# Patient Record
Sex: Male | Born: 1960 | Race: White | Hispanic: No | Marital: Married | Smoking: Former smoker
Health system: Southern US, Community
[De-identification: ages and names within clinical notes are randomized; demographics above are authoritative.]

## PROBLEM LIST (undated history)

## (undated) DIAGNOSIS — I10 Essential (primary) hypertension: Secondary | ICD-10-CM

## (undated) DIAGNOSIS — E781 Pure hyperglyceridemia: Secondary | ICD-10-CM

## (undated) DIAGNOSIS — I2510 Atherosclerotic heart disease of native coronary artery without angina pectoris: Secondary | ICD-10-CM

## (undated) DIAGNOSIS — I341 Nonrheumatic mitral (valve) prolapse: Secondary | ICD-10-CM

## (undated) DIAGNOSIS — I519 Heart disease, unspecified: Secondary | ICD-10-CM

## (undated) DIAGNOSIS — K219 Gastro-esophageal reflux disease without esophagitis: Secondary | ICD-10-CM

## (undated) DIAGNOSIS — K760 Fatty (change of) liver, not elsewhere classified: Secondary | ICD-10-CM

## (undated) DIAGNOSIS — F329 Major depressive disorder, single episode, unspecified: Secondary | ICD-10-CM

## (undated) DIAGNOSIS — F909 Attention-deficit hyperactivity disorder, unspecified type: Secondary | ICD-10-CM

## (undated) DIAGNOSIS — H409 Unspecified glaucoma: Secondary | ICD-10-CM

## (undated) DIAGNOSIS — F32A Depression, unspecified: Secondary | ICD-10-CM

## (undated) DIAGNOSIS — E785 Hyperlipidemia, unspecified: Secondary | ICD-10-CM

## (undated) DIAGNOSIS — E119 Type 2 diabetes mellitus without complications: Secondary | ICD-10-CM

## (undated) DIAGNOSIS — R51 Headache: Secondary | ICD-10-CM

## (undated) DIAGNOSIS — G47411 Narcolepsy with cataplexy: Secondary | ICD-10-CM

## (undated) DIAGNOSIS — E291 Testicular hypofunction: Secondary | ICD-10-CM

## (undated) DIAGNOSIS — F419 Anxiety disorder, unspecified: Secondary | ICD-10-CM

## (undated) DIAGNOSIS — R519 Headache, unspecified: Secondary | ICD-10-CM

## (undated) HISTORY — DX: Anxiety disorder, unspecified: F41.9

## (undated) HISTORY — PX: OTHER SURGICAL HISTORY: SHX169

## (undated) HISTORY — DX: Heart disease, unspecified: I51.9

## (undated) HISTORY — PX: SPINE SURGERY: SHX786

## (undated) HISTORY — DX: Attention-deficit hyperactivity disorder, unspecified type: F90.9

## (undated) HISTORY — PX: THROAT SURGERY: SHX803

---

## 1998-11-20 DIAGNOSIS — Z79899 Other long term (current) drug therapy: Secondary | ICD-10-CM | POA: Insufficient documentation

## 1998-11-20 DIAGNOSIS — M961 Postlaminectomy syndrome, not elsewhere classified: Secondary | ICD-10-CM | POA: Insufficient documentation

## 2002-11-25 ENCOUNTER — Encounter: Admission: RE | Admit: 2002-11-25 | Discharge: 2002-11-25 | Payer: Self-pay | Admitting: Pain Medicine

## 2002-11-25 ENCOUNTER — Encounter: Payer: Self-pay | Admitting: Pain Medicine

## 2004-08-15 ENCOUNTER — Ambulatory Visit: Payer: Self-pay | Admitting: Pain Medicine

## 2004-08-29 ENCOUNTER — Ambulatory Visit: Payer: Self-pay | Admitting: Physician Assistant

## 2004-09-25 ENCOUNTER — Ambulatory Visit: Payer: Self-pay | Admitting: Physician Assistant

## 2004-12-03 ENCOUNTER — Ambulatory Visit: Payer: Self-pay | Admitting: Pain Medicine

## 2004-12-04 ENCOUNTER — Ambulatory Visit: Payer: Self-pay | Admitting: Pain Medicine

## 2004-12-26 ENCOUNTER — Ambulatory Visit: Payer: Self-pay | Admitting: Physician Assistant

## 2005-01-10 ENCOUNTER — Ambulatory Visit: Payer: Self-pay | Admitting: Pain Medicine

## 2005-02-21 ENCOUNTER — Ambulatory Visit: Payer: Self-pay | Admitting: Physician Assistant

## 2005-03-28 ENCOUNTER — Ambulatory Visit: Payer: Self-pay | Admitting: Physician Assistant

## 2005-05-02 ENCOUNTER — Ambulatory Visit: Payer: Self-pay | Admitting: Physician Assistant

## 2005-05-14 ENCOUNTER — Ambulatory Visit: Payer: Self-pay | Admitting: Pain Medicine

## 2005-06-03 ENCOUNTER — Ambulatory Visit: Payer: Self-pay | Admitting: Physician Assistant

## 2005-07-02 ENCOUNTER — Ambulatory Visit: Payer: Self-pay | Admitting: Physician Assistant

## 2005-07-18 ENCOUNTER — Ambulatory Visit: Payer: Self-pay | Admitting: Pain Medicine

## 2005-08-01 ENCOUNTER — Ambulatory Visit: Payer: Self-pay | Admitting: Physician Assistant

## 2005-08-29 ENCOUNTER — Ambulatory Visit: Payer: Self-pay | Admitting: Physician Assistant

## 2005-10-01 ENCOUNTER — Ambulatory Visit: Payer: Self-pay | Admitting: Pain Medicine

## 2005-10-10 ENCOUNTER — Ambulatory Visit: Payer: Self-pay | Admitting: Pain Medicine

## 2005-11-05 ENCOUNTER — Ambulatory Visit: Payer: Self-pay | Admitting: Physician Assistant

## 2005-12-31 ENCOUNTER — Ambulatory Visit: Payer: Self-pay | Admitting: Physician Assistant

## 2006-01-28 ENCOUNTER — Ambulatory Visit: Payer: Self-pay | Admitting: Physician Assistant

## 2006-02-26 ENCOUNTER — Ambulatory Visit: Payer: Self-pay | Admitting: Physician Assistant

## 2006-03-27 ENCOUNTER — Ambulatory Visit: Payer: Self-pay | Admitting: Physician Assistant

## 2006-04-22 ENCOUNTER — Ambulatory Visit: Payer: Self-pay | Admitting: Pain Medicine

## 2006-05-12 ENCOUNTER — Ambulatory Visit: Payer: Self-pay | Admitting: Physician Assistant

## 2006-06-26 ENCOUNTER — Ambulatory Visit: Payer: Self-pay | Admitting: Physician Assistant

## 2006-07-08 ENCOUNTER — Ambulatory Visit: Payer: Self-pay | Admitting: Pain Medicine

## 2006-07-28 ENCOUNTER — Ambulatory Visit: Payer: Self-pay | Admitting: Physician Assistant

## 2006-08-12 ENCOUNTER — Ambulatory Visit: Payer: Self-pay | Admitting: Pain Medicine

## 2006-08-28 ENCOUNTER — Ambulatory Visit: Payer: Self-pay | Admitting: Physician Assistant

## 2006-09-11 ENCOUNTER — Ambulatory Visit: Payer: Self-pay | Admitting: Physician Assistant

## 2006-11-10 ENCOUNTER — Ambulatory Visit: Payer: Self-pay | Admitting: Pain Medicine

## 2006-11-19 ENCOUNTER — Ambulatory Visit: Payer: Self-pay | Admitting: Unknown Physician Specialty

## 2006-11-20 ENCOUNTER — Ambulatory Visit: Payer: Self-pay | Admitting: Pain Medicine

## 2006-11-26 ENCOUNTER — Ambulatory Visit: Payer: Self-pay | Admitting: Unknown Physician Specialty

## 2006-12-10 ENCOUNTER — Ambulatory Visit: Payer: Self-pay | Admitting: Physician Assistant

## 2006-12-25 ENCOUNTER — Ambulatory Visit: Payer: Self-pay | Admitting: Pain Medicine

## 2007-01-08 ENCOUNTER — Ambulatory Visit: Payer: Self-pay | Admitting: Physician Assistant

## 2007-02-06 ENCOUNTER — Ambulatory Visit: Payer: Self-pay | Admitting: Physician Assistant

## 2007-03-10 ENCOUNTER — Ambulatory Visit: Payer: Self-pay | Admitting: Physician Assistant

## 2007-04-17 ENCOUNTER — Ambulatory Visit: Payer: Self-pay | Admitting: Physician Assistant

## 2007-05-13 ENCOUNTER — Ambulatory Visit: Payer: Self-pay | Admitting: Physician Assistant

## 2007-06-09 ENCOUNTER — Ambulatory Visit: Payer: Self-pay | Admitting: Pain Medicine

## 2007-07-15 ENCOUNTER — Ambulatory Visit: Payer: Self-pay | Admitting: Physician Assistant

## 2007-08-10 ENCOUNTER — Ambulatory Visit: Payer: Self-pay | Admitting: Pain Medicine

## 2007-11-10 ENCOUNTER — Ambulatory Visit: Payer: Self-pay | Admitting: Physician Assistant

## 2008-02-10 ENCOUNTER — Ambulatory Visit: Payer: Self-pay | Admitting: Physician Assistant

## 2008-04-28 ENCOUNTER — Ambulatory Visit: Payer: Self-pay | Admitting: Pain Medicine

## 2008-06-03 ENCOUNTER — Ambulatory Visit (HOSPITAL_COMMUNITY): Admission: RE | Admit: 2008-06-03 | Discharge: 2008-06-03 | Payer: Self-pay | Admitting: Neurological Surgery

## 2008-08-16 ENCOUNTER — Ambulatory Visit: Payer: Self-pay | Admitting: Physician Assistant

## 2008-10-18 ENCOUNTER — Ambulatory Visit: Payer: Self-pay | Admitting: Family Medicine

## 2008-11-02 ENCOUNTER — Ambulatory Visit: Payer: Self-pay | Admitting: Family Medicine

## 2008-11-09 ENCOUNTER — Ambulatory Visit: Payer: Self-pay | Admitting: Pain Medicine

## 2008-11-09 ENCOUNTER — Ambulatory Visit: Payer: Self-pay | Admitting: Physician Assistant

## 2008-11-23 ENCOUNTER — Ambulatory Visit: Payer: Self-pay | Admitting: Pain Medicine

## 2008-12-07 ENCOUNTER — Ambulatory Visit: Payer: Self-pay | Admitting: Pain Medicine

## 2009-03-08 ENCOUNTER — Ambulatory Visit: Payer: Self-pay | Admitting: Pain Medicine

## 2009-04-17 ENCOUNTER — Ambulatory Visit: Payer: Self-pay | Admitting: Pain Medicine

## 2009-04-24 ENCOUNTER — Inpatient Hospital Stay: Payer: Self-pay | Admitting: Pain Medicine

## 2009-04-24 ENCOUNTER — Ambulatory Visit: Payer: Self-pay | Admitting: Pain Medicine

## 2009-05-01 ENCOUNTER — Ambulatory Visit: Payer: Self-pay | Admitting: Pain Medicine

## 2009-05-12 ENCOUNTER — Ambulatory Visit: Payer: Self-pay | Admitting: Pain Medicine

## 2009-05-16 ENCOUNTER — Ambulatory Visit: Payer: Self-pay | Admitting: Pain Medicine

## 2009-05-25 ENCOUNTER — Ambulatory Visit: Payer: Self-pay | Admitting: Pain Medicine

## 2009-05-25 ENCOUNTER — Ambulatory Visit: Payer: Self-pay | Admitting: Physician Assistant

## 2009-05-31 ENCOUNTER — Ambulatory Visit: Payer: Self-pay | Admitting: Physician Assistant

## 2009-06-08 ENCOUNTER — Ambulatory Visit: Payer: Self-pay | Admitting: Pain Medicine

## 2009-06-12 ENCOUNTER — Ambulatory Visit: Payer: Self-pay | Admitting: Pain Medicine

## 2009-06-15 ENCOUNTER — Ambulatory Visit: Payer: Self-pay | Admitting: Physician Assistant

## 2009-06-29 ENCOUNTER — Ambulatory Visit: Payer: Self-pay | Admitting: Physician Assistant

## 2009-07-05 ENCOUNTER — Ambulatory Visit: Payer: Self-pay | Admitting: Physician Assistant

## 2009-07-13 ENCOUNTER — Ambulatory Visit: Payer: Self-pay | Admitting: Physician Assistant

## 2009-07-24 ENCOUNTER — Ambulatory Visit: Payer: Self-pay | Admitting: Physician Assistant

## 2009-08-14 ENCOUNTER — Ambulatory Visit: Payer: Self-pay | Admitting: Pain Medicine

## 2009-08-14 ENCOUNTER — Other Ambulatory Visit: Payer: Self-pay | Admitting: Pain Medicine

## 2009-08-28 ENCOUNTER — Ambulatory Visit: Payer: Self-pay | Admitting: Pain Medicine

## 2009-09-18 ENCOUNTER — Ambulatory Visit: Payer: Self-pay | Admitting: Pain Medicine

## 2009-09-26 ENCOUNTER — Ambulatory Visit: Payer: Self-pay

## 2009-10-02 ENCOUNTER — Ambulatory Visit: Payer: Self-pay | Admitting: Pain Medicine

## 2009-10-12 ENCOUNTER — Ambulatory Visit: Payer: Self-pay | Admitting: Unknown Physician Specialty

## 2009-10-13 ENCOUNTER — Ambulatory Visit: Payer: Self-pay | Admitting: Unknown Physician Specialty

## 2009-10-25 ENCOUNTER — Ambulatory Visit: Payer: Self-pay

## 2009-10-31 ENCOUNTER — Ambulatory Visit: Payer: Self-pay | Admitting: Unknown Physician Specialty

## 2009-11-21 ENCOUNTER — Ambulatory Visit: Payer: Self-pay | Admitting: Family Medicine

## 2009-11-27 ENCOUNTER — Ambulatory Visit: Payer: Self-pay | Admitting: Pain Medicine

## 2009-12-21 ENCOUNTER — Ambulatory Visit: Payer: Self-pay | Admitting: Family Medicine

## 2009-12-22 ENCOUNTER — Ambulatory Visit: Payer: Self-pay | Admitting: Pain Medicine

## 2010-02-21 ENCOUNTER — Ambulatory Visit: Payer: Self-pay | Admitting: Neurological Surgery

## 2010-03-15 ENCOUNTER — Ambulatory Visit: Payer: Self-pay | Admitting: Pain Medicine

## 2010-03-16 ENCOUNTER — Ambulatory Visit: Payer: Self-pay | Admitting: Pain Medicine

## 2010-03-19 ENCOUNTER — Ambulatory Visit: Payer: Self-pay | Admitting: Pain Medicine

## 2010-03-28 ENCOUNTER — Ambulatory Visit: Payer: Self-pay | Admitting: Pain Medicine

## 2010-04-17 ENCOUNTER — Ambulatory Visit: Payer: Self-pay | Admitting: Pain Medicine

## 2010-04-30 ENCOUNTER — Ambulatory Visit: Payer: Self-pay | Admitting: Neurological Surgery

## 2010-05-07 ENCOUNTER — Emergency Department: Payer: Self-pay | Admitting: Emergency Medicine

## 2010-06-11 ENCOUNTER — Ambulatory Visit: Payer: Self-pay | Admitting: Pain Medicine

## 2010-06-14 ENCOUNTER — Ambulatory Visit: Payer: Self-pay | Admitting: Pain Medicine

## 2010-06-18 ENCOUNTER — Ambulatory Visit: Payer: Self-pay | Admitting: Pain Medicine

## 2010-06-22 ENCOUNTER — Ambulatory Visit: Payer: Self-pay | Admitting: Pain Medicine

## 2010-07-02 ENCOUNTER — Ambulatory Visit: Payer: Self-pay | Admitting: Pain Medicine

## 2010-08-30 ENCOUNTER — Ambulatory Visit: Payer: Self-pay | Admitting: Pain Medicine

## 2010-08-31 ENCOUNTER — Ambulatory Visit: Payer: Self-pay | Admitting: Pain Medicine

## 2010-09-05 ENCOUNTER — Ambulatory Visit: Payer: Self-pay | Admitting: Pain Medicine

## 2010-09-10 ENCOUNTER — Ambulatory Visit: Payer: Self-pay | Admitting: Pain Medicine

## 2010-09-12 ENCOUNTER — Ambulatory Visit: Payer: Self-pay | Admitting: Pain Medicine

## 2010-09-19 ENCOUNTER — Ambulatory Visit: Payer: Self-pay | Admitting: Pain Medicine

## 2011-03-19 NOTE — Op Note (Signed)
Dale [name], Dale [name] NO.:  [id number]   MEDICAL RECORD NO.:  [id number]          PATIENT TYPE:  AMB   LOCATION:  SDS                          FACILITY:  [facility]   PHYSICIAN:  Tia Alert, MD     DATE OF BIRTH:  1961/09/23   DATE OF PROCEDURE:  06/03/2008  DATE OF DISCHARGE:                               OPERATIVE REPORT   PREOPERATIVE DIAGNOSIS:  Right carpal tunnel syndrome.   POSTOPERATIVE DIAGNOSIS:  Right carpal tunnel syndrome.   PROCEDURE:  Right carpal tunnel release.   SURGEON:  Tia Alert, MD   ANESTHESIA:  Local with MAC.   COMPLICATIONS:  None apparent.   INDICATIONS FOR PROCEDURE:  [name] is a 50 year old gentleman who  presented with significant numbness and tingling in the right hand,  especially at night.  He also felt himself shaking his hands out.  He  had nerve conduction studies showing significant median neuropathy on  the right side.  I recommended a right carpal tunnel release.  He  understood the risks, benefits, and expected outcome and wished to  proceed.   DESCRIPTION OF PROCEDURE:  The patient was taken to the operating room  and placed in the supine position with his right arm extended on an arm  board.  His arm and hand were prepped circumferentially from the axilla  to the fingertips with DuraPrep and draped in the usual sterile fashion.  A 9 mL of local anesthesia was injected and a small palmar incision was  made from the distal wrist crease into the palm in line with the middle  finger.  I carried the incision down through the palmar fascia to  identify the palmar fat.  I coagulated this and then identified the  transverse carpal ligament.  The ligament was opened with a 15 blade  scalpel and then spread between the ligament and the median nerve with a  curved hemostat and completely transected the transverse carpal ligament  distally into the palm until it was completely free and I saw the palmar  fat.  Once it  was transected, I palpated with a curved hemostat and felt  no more compression of the median nerve distally into the palm.  I then  did the same thing proximally under the wrist crease spreading between  the nerve and the ligament with a curved hemostat and transecting the  ligament completely proximally into the wrist.  Once this was done, I  once again palpated with a curved hemostat to assure complete  transection of the transverse carpal ligament.  I then irrigated with  saline solution and inspected the nerve once again, it looked healthy  and free.  I palpated it once again and then dried all bleeding points  and then closed the palmar fascia with a single 3-0 Vicryl, closed the  subcuticular tissue with  interrupted 3-0 Vicryl, and closed the skin with interrupted 4-0 Ethilon  vertical mattress sutures.  The hand was then wrapped in a Kerlix and  Ace bandage.  The patient was taken to the recovery room in stable  condition.  At the end of the procedure, all sponge, needle, and  instrument counts were correct.      Tia Alert, MD  Electronically Signed     DSJ/MEDQ  D:  06/03/2008  T:  06/03/2008  Job:  [id number]

## 2011-08-02 LAB — DIFFERENTIAL
Basophils Absolute: 0
Basophils Relative: 0
Eosinophils Absolute: 0.1
Eosinophils Relative: 1
Lymphocytes Relative: 27
Lymphs Abs: 2.5
Monocytes Absolute: 0.9
Monocytes Relative: 10
Neutro Abs: 5.7
Neutrophils Relative %: 62

## 2011-08-02 LAB — BASIC METABOLIC PANEL
BUN: 16
CO2: 35 — ABNORMAL HIGH
Calcium: 9.7
Chloride: 98
Creatinine, Ser: 0.65
GFR calc Af Amer: >60
GFR calc non Af Amer: >60
Glucose, Bld: 95
Potassium: 3.7
Sodium: 140

## 2011-08-02 LAB — CBC
HCT: 41.9
Hemoglobin: 14.3
MCHC: 34
MCV: 93.8
Platelets: 238
RBC: 4.47
RDW: 15.2
WBC: 9.3

## 2011-08-02 LAB — APTT: aPTT: 35

## 2011-08-02 LAB — PROTIME-INR: INR: 0.9

## 2011-10-07 ENCOUNTER — Ambulatory Visit: Payer: Self-pay | Admitting: Unknown Physician Specialty

## 2012-08-05 DIAGNOSIS — R413 Other amnesia: Secondary | ICD-10-CM | POA: Insufficient documentation

## 2013-08-13 ENCOUNTER — Ambulatory Visit: Payer: Self-pay | Admitting: Internal Medicine

## 2013-08-13 LAB — CBC WITH DIFFERENTIAL/PLATELET
HCT: 44.9 % (ref 40.0–52.0)
Lymphocyte %: 26.3 %
MCH: 30.8 pg (ref 26.0–34.0)
MCV: 91 fL (ref 80–100)
Monocyte %: 7.4 %
Neutrophil %: 63.8 %
Platelet: 217 10*3/uL (ref 150–440)
RDW: 14.1 % (ref 11.5–14.5)
WBC: 9.3 10*3/uL (ref 3.8–10.6)

## 2013-08-13 LAB — COMPREHENSIVE METABOLIC PANEL
Albumin: 4.1 g/dL (ref 3.4–5.0)
Bilirubin,Total: 0.5 mg/dL (ref 0.2–1.0)
Calcium, Total: 9.3 mg/dL (ref 8.5–10.1)
Creatinine: 0.95 mg/dL (ref 0.60–1.30)
EGFR (Non-African Amer.): >60
Glucose: 110 mg/dL — ABNORMAL HIGH (ref 65–99)
Potassium: 4.5 mmol/L (ref 3.5–5.1)
SGOT(AST): 65 U/L — ABNORMAL HIGH (ref 15–37)

## 2013-08-15 ENCOUNTER — Emergency Department: Payer: Self-pay | Admitting: Emergency Medicine

## 2013-08-15 ENCOUNTER — Ambulatory Visit: Payer: Self-pay

## 2013-08-15 LAB — URINALYSIS, COMPLETE
Bilirubin,UR: NEGATIVE
Blood: NEGATIVE
Leukocyte Esterase: NEGATIVE
Leukocyte Esterase: NEGATIVE
Nitrite: NEGATIVE
Ph: 5 (ref 4.5–8.0)
Ph: 5 (ref 4.5–8.0)
Protein: 30
Specific Gravity: 1.026 (ref 1.003–1.030)
Squamous Epithelial: NONE SEEN
WBC UR: 2 /HPF (ref 0–5)

## 2013-08-15 LAB — CBC WITH DIFFERENTIAL/PLATELET
Basophil #: 0.1 10*3/uL (ref 0.0–0.1)
Basophil %: 0.7 %
Eosinophil #: 0.1 10*3/uL (ref 0.0–0.7)
Eosinophil %: 0.9 %
HCT: 44 % (ref 40.0–52.0)
HGB: 15 g/dL (ref 13.0–18.0)
Lymphocyte %: 26.4 %
MCH: 31 pg (ref 26.0–34.0)
MCV: 91 fL (ref 80–100)
Monocyte #: 0.7 x10 3/mm (ref 0.2–1.0)
Monocyte %: 6.8 %
RDW: 14.1 % (ref 11.5–14.5)

## 2013-08-15 LAB — COMPREHENSIVE METABOLIC PANEL
Albumin: 4.7 g/dL (ref 3.4–5.0)
Anion Gap: 16 (ref 7–16)
BUN: 23 mg/dL — ABNORMAL HIGH (ref 7–18)
Bilirubin,Total: 0.6 mg/dL (ref 0.2–1.0)
Calcium, Total: 10 mg/dL (ref 8.5–10.1)
Chloride: 101 mmol/L (ref 98–107)
EGFR (African American): >60
EGFR (Non-African Amer.): >60
Osmolality: 277 (ref 275–301)
Potassium: 4 mmol/L (ref 3.5–5.1)
SGOT(AST): 63 U/L — ABNORMAL HIGH (ref 15–37)
Sodium: 136 mmol/L (ref 136–145)
Total Protein: 8.5 g/dL — ABNORMAL HIGH (ref 6.4–8.2)

## 2013-08-15 LAB — LIPASE, BLOOD: Lipase: 80 U/L (ref 73–393)

## 2013-08-20 ENCOUNTER — Emergency Department: Payer: Self-pay | Admitting: Emergency Medicine

## 2013-08-20 LAB — COMPREHENSIVE METABOLIC PANEL
Albumin: 3.9 g/dL (ref 3.4–5.0)
Alkaline Phosphatase: 109 U/L (ref 50–136)
BUN: 17 mg/dL (ref 7–18)
Bilirubin,Total: 0.6 mg/dL (ref 0.2–1.0)
EGFR (Non-African Amer.): >60
Osmolality: 284 (ref 275–301)
SGPT (ALT): 81 U/L — ABNORMAL HIGH (ref 12–78)
Sodium: 140 mmol/L (ref 136–145)

## 2013-08-20 LAB — CBC
HGB: 14.3 g/dL (ref 13.0–18.0)
MCHC: 34.7 g/dL (ref 32.0–36.0)
MCV: 89 fL (ref 80–100)
Platelet: 222 10*3/uL (ref 150–440)
RBC: 4.66 10*6/uL (ref 4.40–5.90)

## 2013-08-20 LAB — URINALYSIS, COMPLETE
Bilirubin,UR: NEGATIVE
Glucose,UR: NEGATIVE mg/dL (ref 0–75)
Ph: 5 (ref 4.5–8.0)
WBC UR: 3 /HPF (ref 0–5)

## 2013-08-20 LAB — LIPASE, BLOOD: Lipase: 60 U/L — ABNORMAL LOW (ref 73–393)

## 2013-09-09 DIAGNOSIS — Z79899 Other long term (current) drug therapy: Secondary | ICD-10-CM | POA: Insufficient documentation

## 2013-09-09 DIAGNOSIS — Z0289 Encounter for other administrative examinations: Secondary | ICD-10-CM | POA: Insufficient documentation

## 2013-09-20 ENCOUNTER — Ambulatory Visit: Payer: Self-pay | Admitting: Unknown Physician Specialty

## 2014-04-29 DIAGNOSIS — Z9889 Other specified postprocedural states: Secondary | ICD-10-CM | POA: Insufficient documentation

## 2014-04-29 DIAGNOSIS — M961 Postlaminectomy syndrome, not elsewhere classified: Secondary | ICD-10-CM | POA: Insufficient documentation

## 2014-04-29 DIAGNOSIS — G894 Chronic pain syndrome: Secondary | ICD-10-CM | POA: Insufficient documentation

## 2014-06-22 DIAGNOSIS — I10 Essential (primary) hypertension: Secondary | ICD-10-CM | POA: Insufficient documentation

## 2014-06-22 DIAGNOSIS — E1169 Type 2 diabetes mellitus with other specified complication: Secondary | ICD-10-CM | POA: Insufficient documentation

## 2014-06-22 DIAGNOSIS — E785 Hyperlipidemia, unspecified: Secondary | ICD-10-CM

## 2014-06-22 DIAGNOSIS — E1140 Type 2 diabetes mellitus with diabetic neuropathy, unspecified: Secondary | ICD-10-CM | POA: Insufficient documentation

## 2014-07-15 ENCOUNTER — Ambulatory Visit: Payer: Self-pay | Admitting: Neurology

## 2014-07-22 DIAGNOSIS — K219 Gastro-esophageal reflux disease without esophagitis: Secondary | ICD-10-CM | POA: Insufficient documentation

## 2014-07-22 DIAGNOSIS — L409 Psoriasis, unspecified: Secondary | ICD-10-CM | POA: Insufficient documentation

## 2014-10-15 IMAGING — CT CT ABD-PELV W/ CM
1 of 4 series · 14 of 32 positions shown, 19 images · non-contrast
Comparison: none

REASON FOR EXAM: (1) ruq pain; (2) ruq pain
COMMENTS:

PROCEDURE:     CT  - CT ABDOMEN / PELVIS  W  - August 15, 2013  [DATE]
RESULT:     History: Right upper quadrant pain.
Comparison Study: Ultrasound 08/15/2013.

[Series 2: 3mm soft tissue · axial · 0.80mm/px · z∈[-596,-137]mm · 14 of 171 slices shown, 19 images]
[im 9/171  soft-tissue]
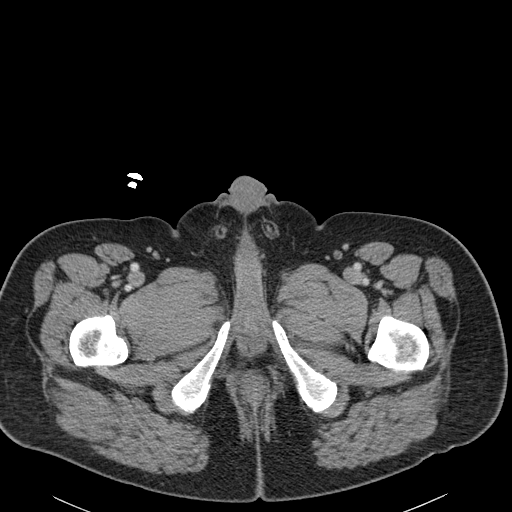
[im 9/171  bone]
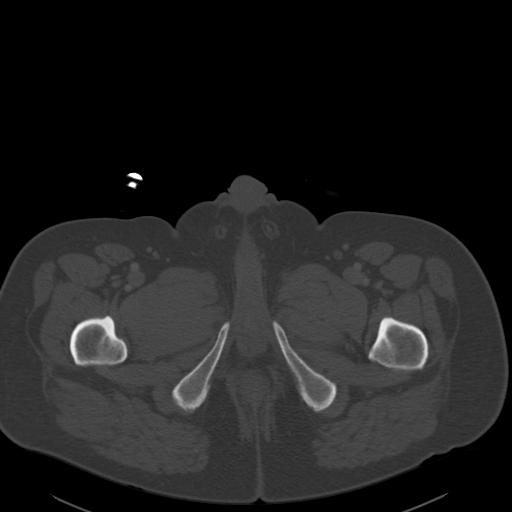
[im 27/171  soft-tissue]
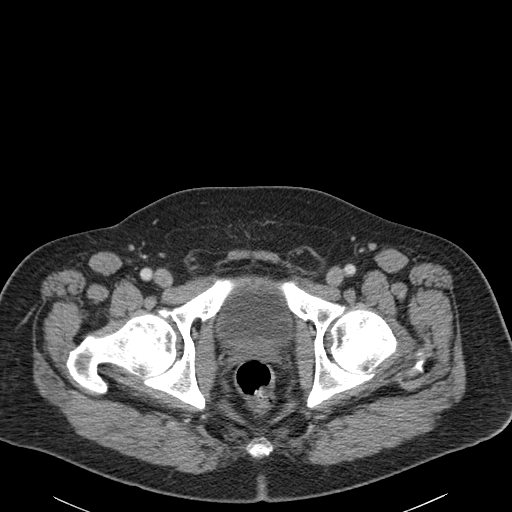
[im 36/171  soft-tissue]
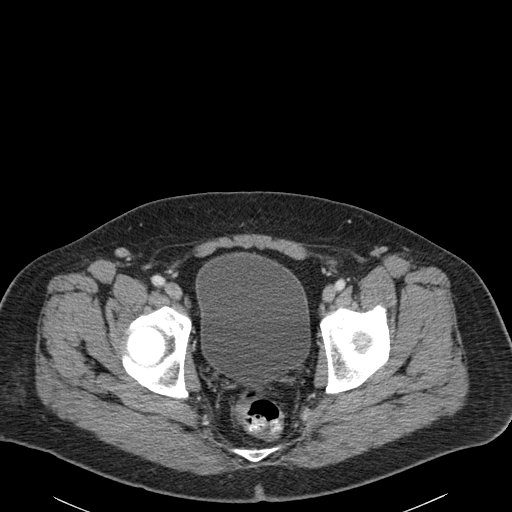
[im 45/171  soft-tissue]
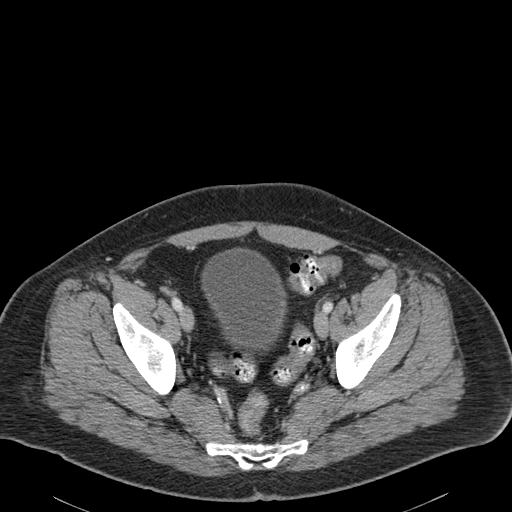
[im 63/171  soft-tissue]
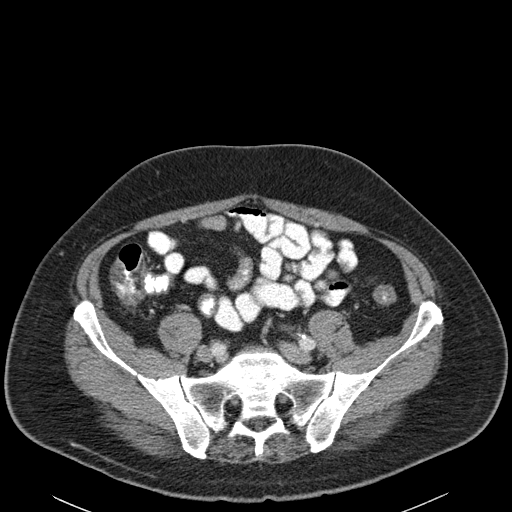
[im 72/171  soft-tissue]
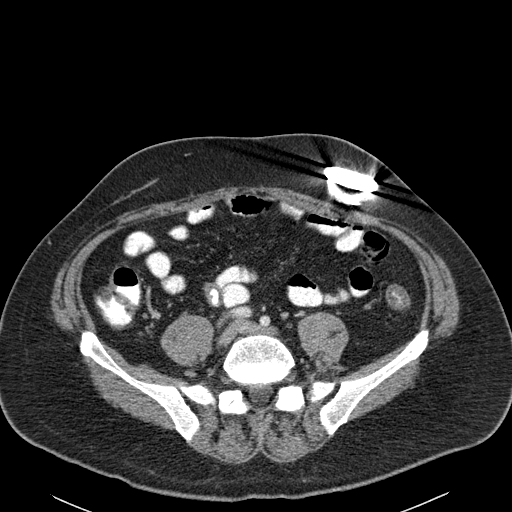
[im 90/171  soft-tissue]
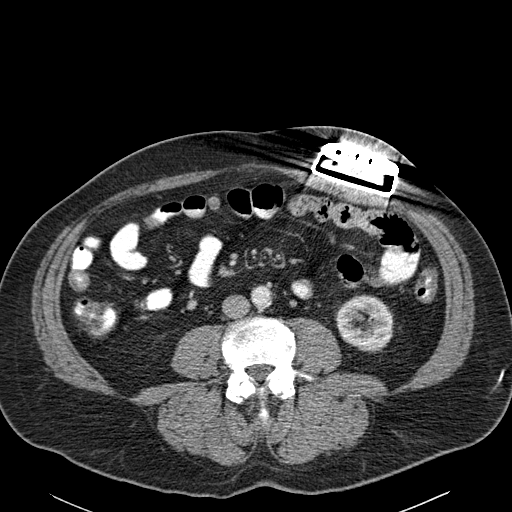
[im 99/171  soft-tissue]
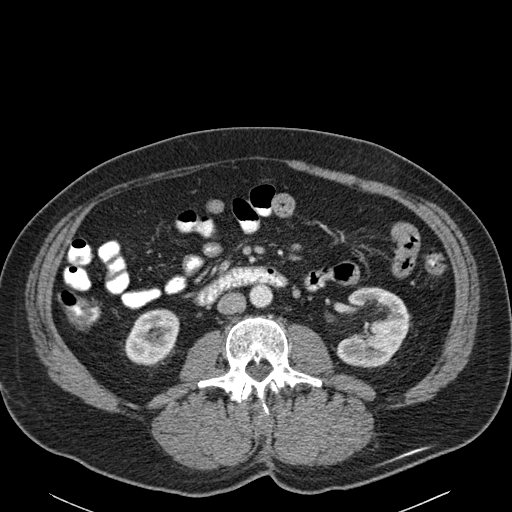
[im 108/171  soft-tissue]
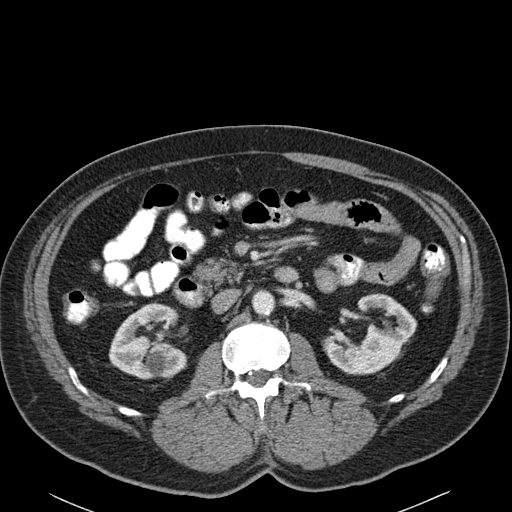
[im 108/171  bone]
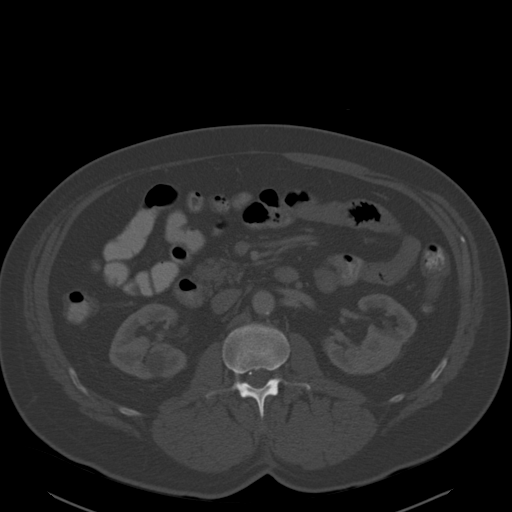
[im 126/171  soft-tissue]
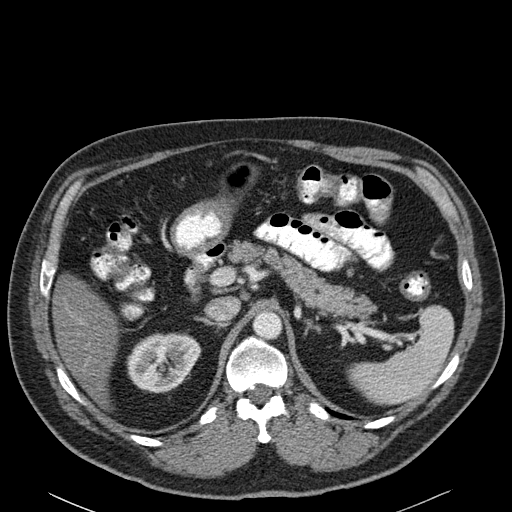
[im 135/171  soft-tissue]
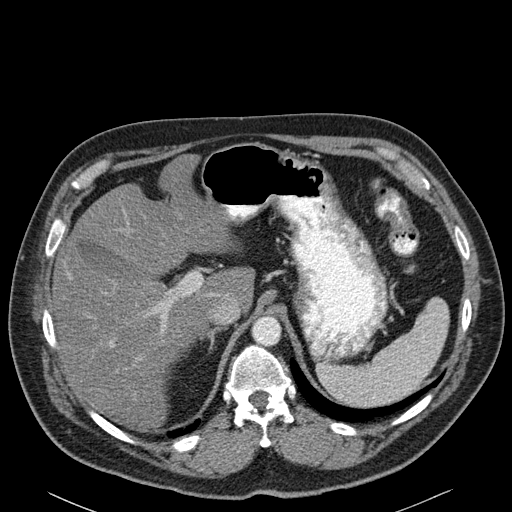
[im 135/171  lung]
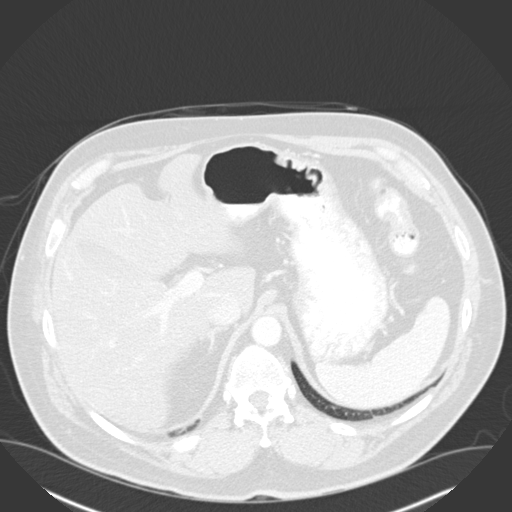
[im 144/171  soft-tissue]
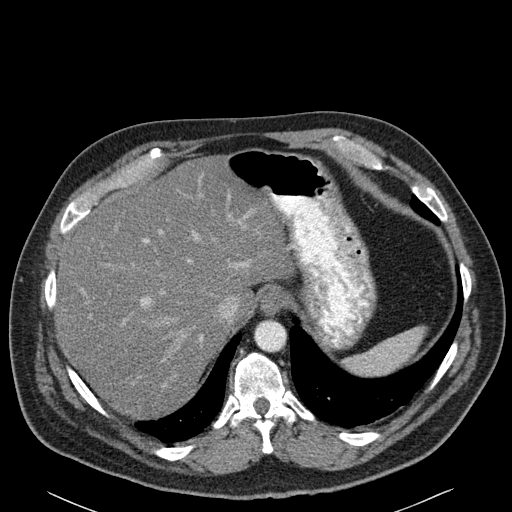
[im 144/171  lung]
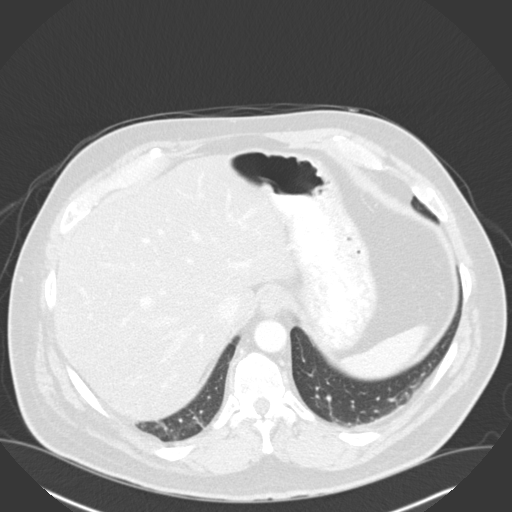
[im 153/171  lung]
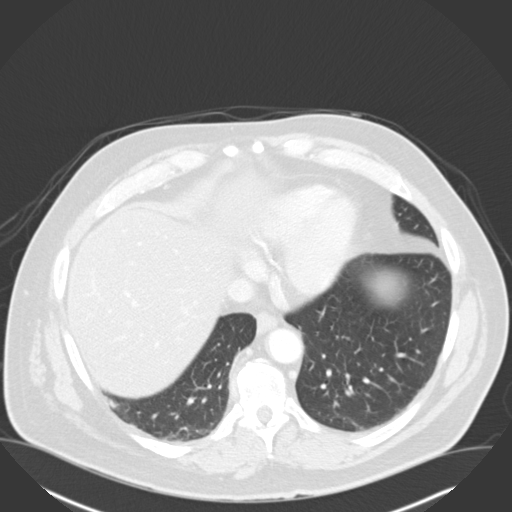
[im 162/171  soft-tissue]
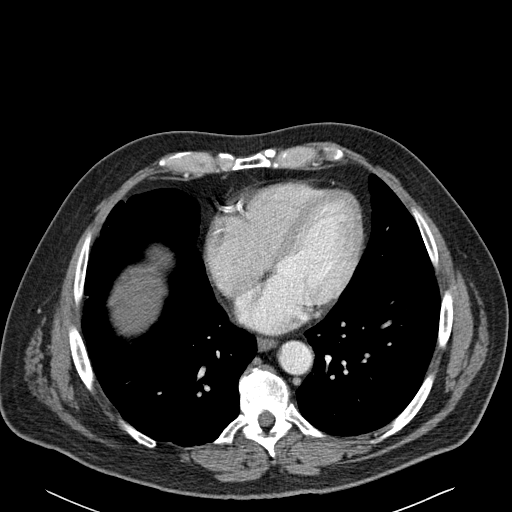
[im 162/171  lung]
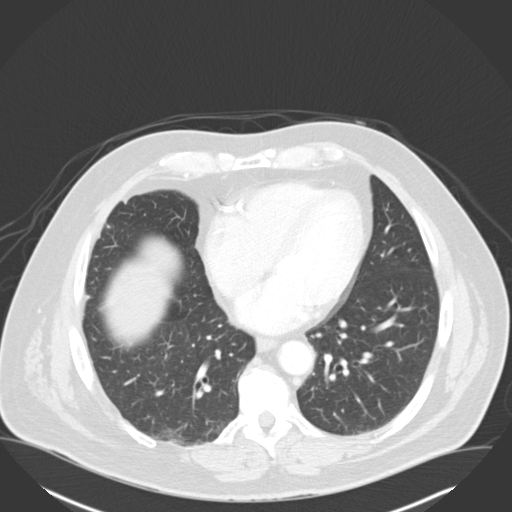

[14 of 32 positions shown; findings below may reference images not displayed]

FINDINGS: Standard CT obtained with 100 cc of 2sovue-M1I. Diffuse fatty
infiltration liver. Spleen normal. Pancreas normal. No biliary distention.

Adrenals normal. Renal cysts. No hydronephrosis. Bladder is nondistended.
Prostate normal. No free pelvic fluid. No adenopathy. Aorta normal caliber.
Celiac, SMA, and renal vasculature patent.

Appendix normal. Mild multifocal colonic wall thickening is noted. This
could be from nondistention however colitis cannot be excluded.

No acute bony abnormality. Atelectasis lung bases. Heart size normal.
IMPRESSION: Cannot exclude mild changes of colitis. Fatty liver.

## 2014-10-15 IMAGING — US ABDOMEN ULTRASOUND LIMITED
1 series · 14 of 25 positions shown · non-contrast
Comparison: none

REASON FOR EXAM: ruq pain
COMMENTS:   Body Site: GB and Fossa, CBD, Head of Pancreas

PROCEDURE:     US  - US ABDOMEN LIMITED SURVEY  - August 15, 2013  [DATE]
RESULT:     Comparison: None
TECHNIQUE: Multiple gray-scale and color-flow Doppler images of the right
upper quadrant are presented for review.

[Series 1: abdomen ultrasound limited · 0.26mm/px · 14 of 45 slices shown]
[im 1/45]
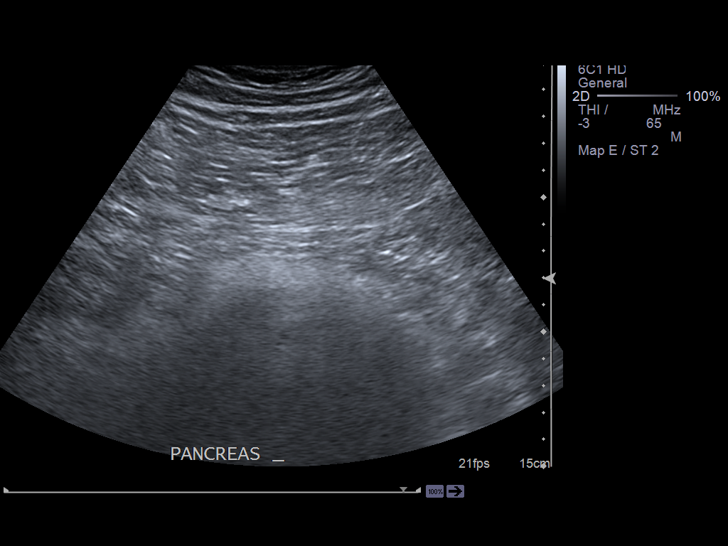
[im 4/45]
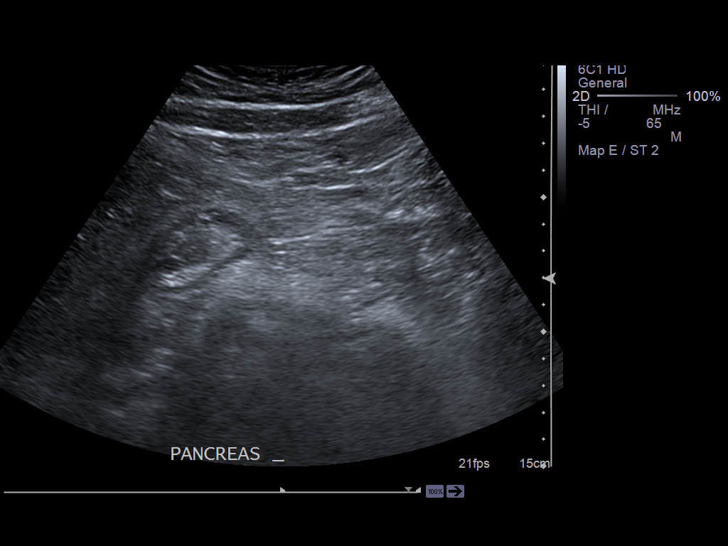
[im 8/45]
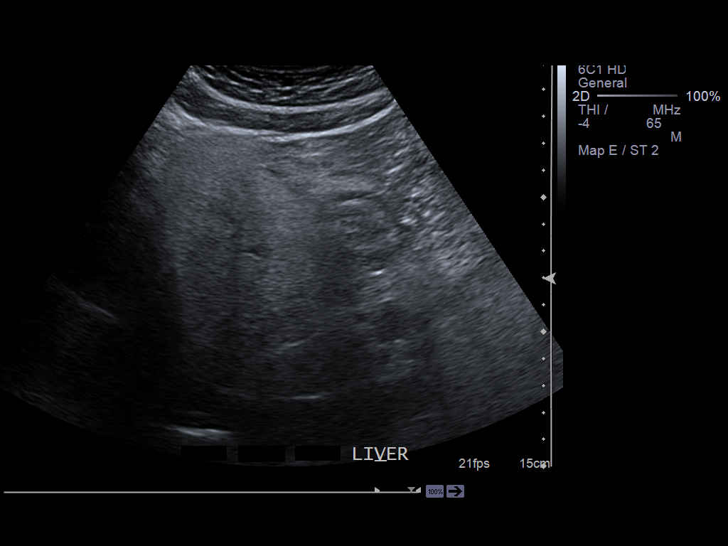
[im 12/45]
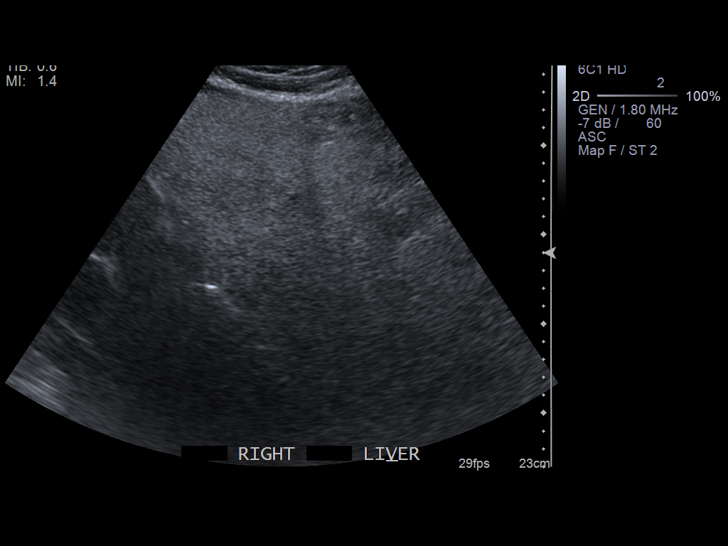
[im 15/45]
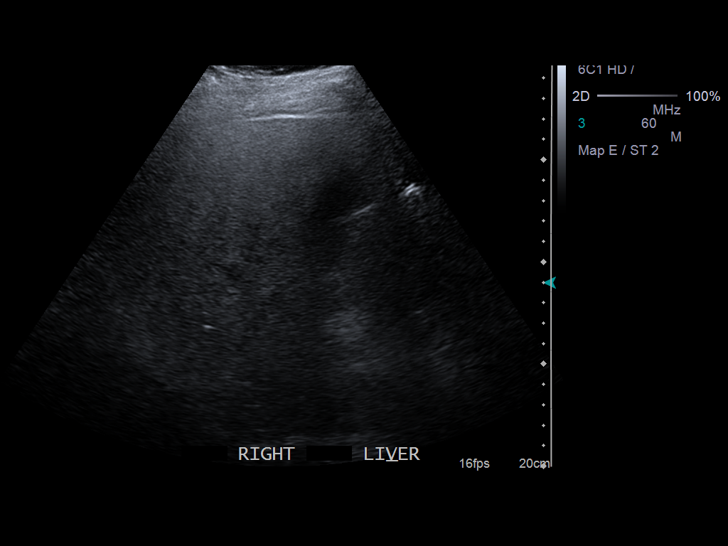
[im 17/45]
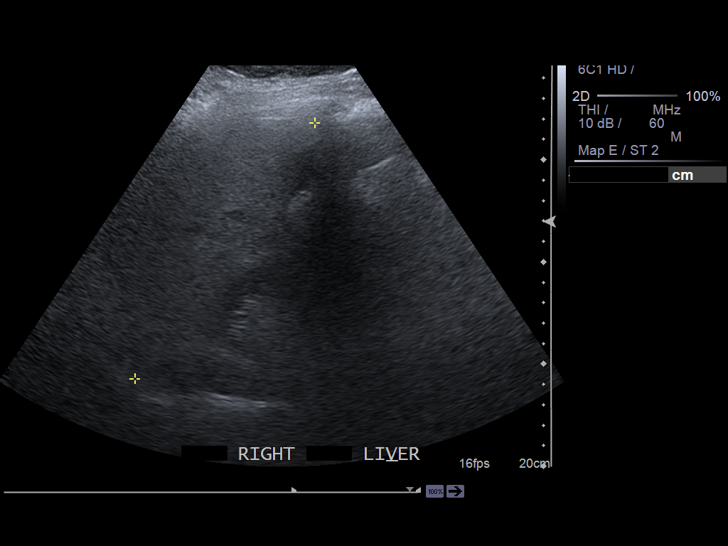
[im 21/45]
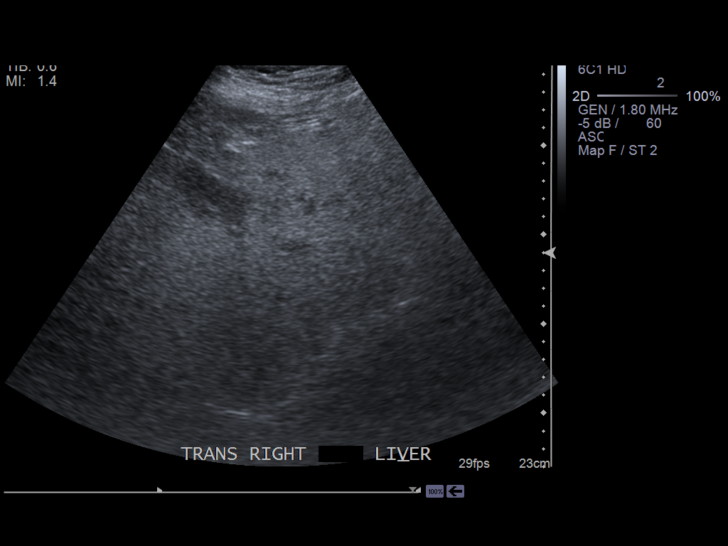
[im 24/45]
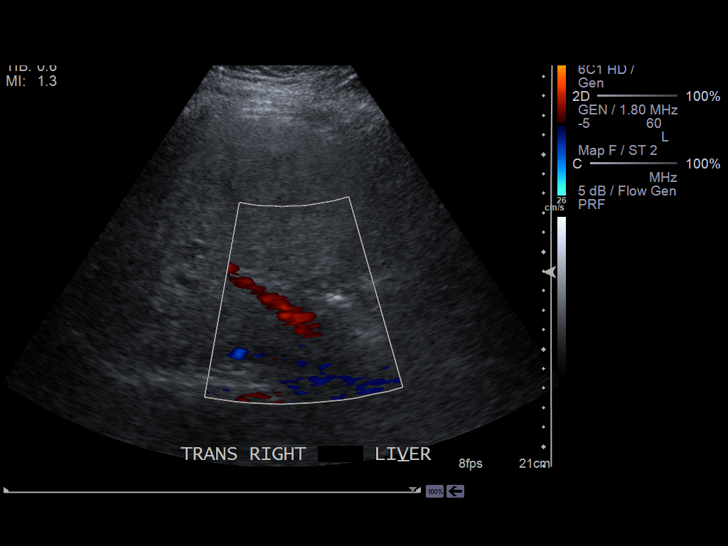
[im 28/45]
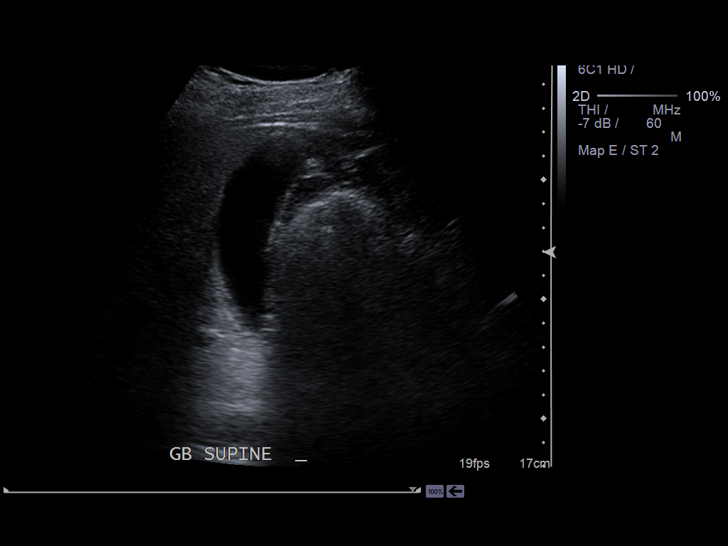
[im 30/45]
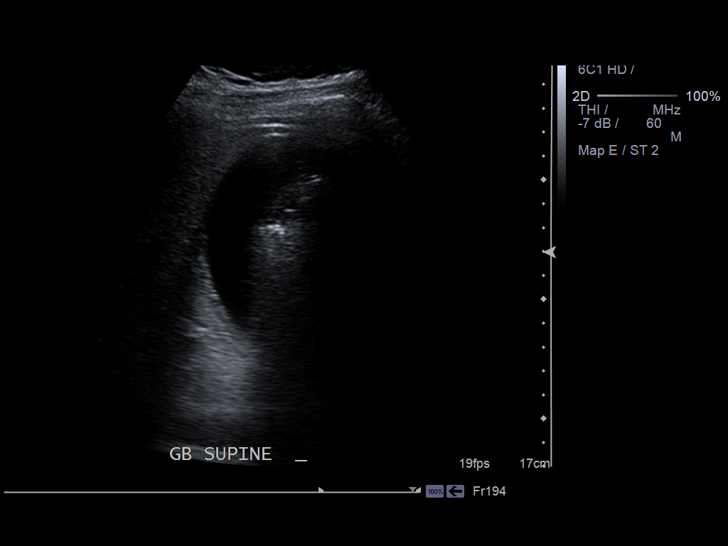
[im 34/45]
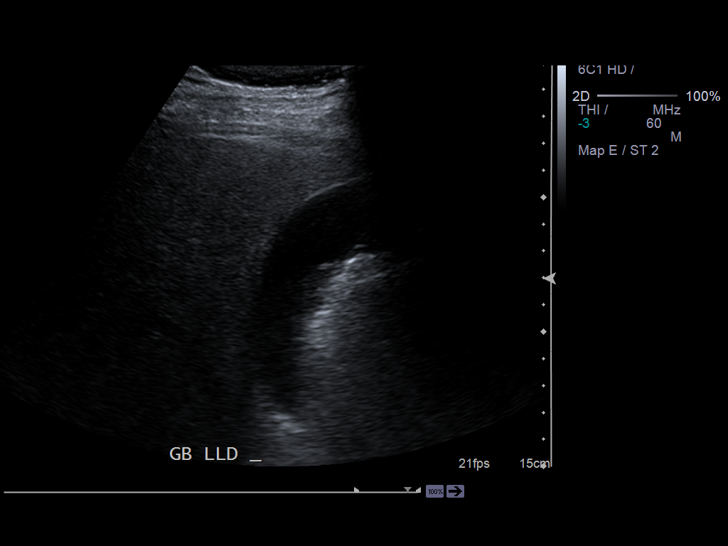
[im 37/45]
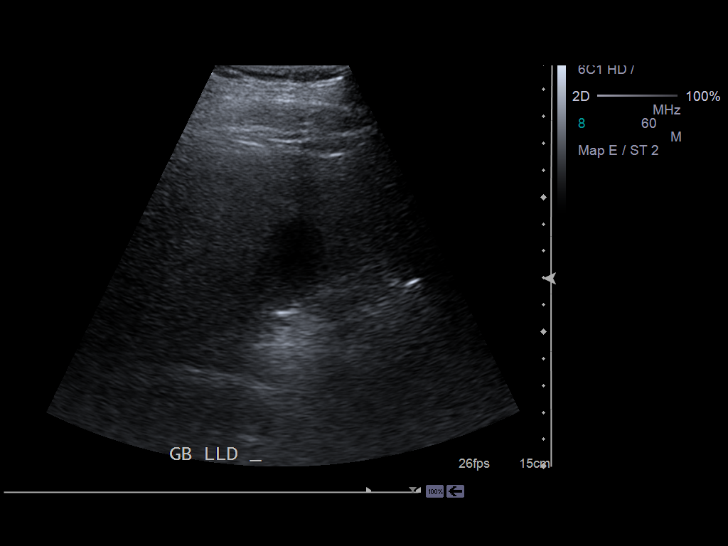
[im 41/45]
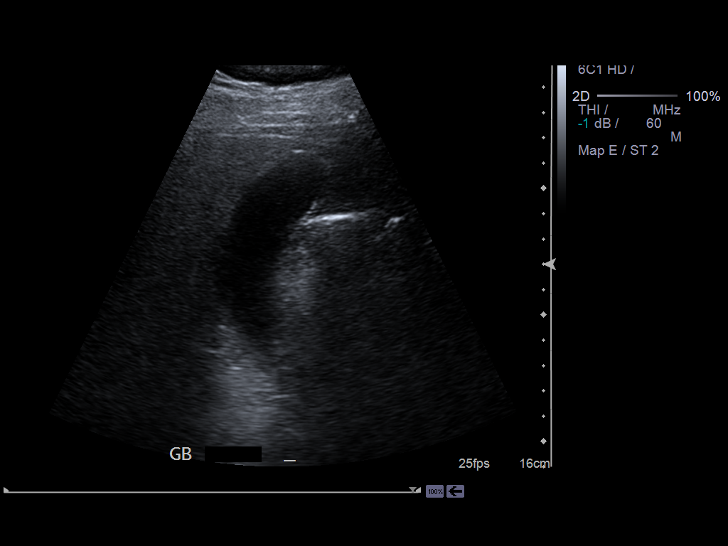
[im 45/45]
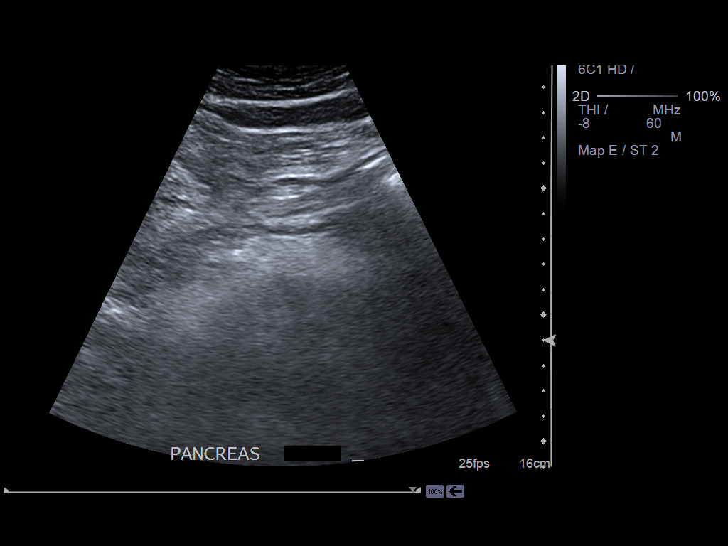

[14 of 25 positions shown; findings below may reference images not displayed]

FINDINGS: The liver is increased in echogenicity as can be seen with hepatic
steatosis. The liver is without evidence of a focal hepatic lesion.

There is no cholelithiasis or biliary sludge. There is no intra- or
extrahepatic biliary ductal dilatation. The common duct measures 5.1 mm in
maximal diameter. There is no gallbladder wall thickening, pericholecystic
fluid. There is a positive sonographic Murphy sign reported.

The visualized portion of the pancreas is normal in echogenicity.
IMPRESSION: No cholelithiasis or sonographic evidence of acute cholecystitis.

Hepatic steatosis.

[REDACTED]

## 2014-10-21 DIAGNOSIS — K760 Fatty (change of) liver, not elsewhere classified: Secondary | ICD-10-CM | POA: Insufficient documentation

## 2015-06-01 ENCOUNTER — Other Ambulatory Visit: Payer: Self-pay

## 2015-06-01 ENCOUNTER — Ambulatory Visit
Admission: EM | Admit: 2015-06-01 | Discharge: 2015-06-01 | Disposition: A | Discharge status: Home / Self Care | Payer: Commercial Managed Care - HMO | Attending: Internal Medicine | Admitting: Internal Medicine

## 2015-06-01 ENCOUNTER — Ambulatory Visit: Payer: Commercial Managed Care - HMO

## 2015-06-01 ENCOUNTER — Encounter: Payer: Self-pay | Admitting: Emergency Medicine

## 2015-06-01 DIAGNOSIS — Z87891 Personal history of nicotine dependence: Secondary | ICD-10-CM | POA: Insufficient documentation

## 2015-06-01 DIAGNOSIS — J181 Lobar pneumonia, unspecified organism: Secondary | ICD-10-CM

## 2015-06-01 DIAGNOSIS — J189 Pneumonia, unspecified organism: Secondary | ICD-10-CM | POA: Diagnosis not present

## 2015-06-01 DIAGNOSIS — R05 Cough: Secondary | ICD-10-CM | POA: Diagnosis present

## 2015-06-01 MED ORDER — BENZONATATE 200 MG PO CAPS: 200.0000 mg | ORAL_CAPSULE | Freq: Three times a day (TID) | ORAL | Status: DC | PRN

## 2015-06-01 MED ORDER — IPRATROPIUM-ALBUTEROL 0.5-2.5 (3) MG/3ML IN SOLN
3.0000 mL | Freq: Once | RESPIRATORY_TRACT | Status: AC
  Administered 2015-06-01: 3 mL via RESPIRATORY_TRACT

## 2015-06-01 MED ORDER — AMOXICILLIN-POT CLAVULANATE 875-125 MG PO TABS: 1.0000 | ORAL_TABLET | Freq: Two times a day (BID) | ORAL | Status: DC

## 2015-06-01 NOTE — Discharge Instructions (Signed)
Prescriptions for augmentin (amoxicillin/clavulanate, antibiotic) and tessalon (benzonatate, for cough) was sent to [facility] in [place]. Chest xray today suggested pneumonia in the R lung base. Chest xray should be repeated in 6-12 weeks, to show that the pneumonia heals up and goes away, and does not need further evaluation.   Pneumonia Pneumonia is an infection of the lungs.  CAUSES Pneumonia may be caused by bacteria or a virus. Usually, these infections are caused by breathing infectious particles into the lungs (respiratory tract). SIGNS AND SYMPTOMS   Cough.  Fever.  Chest pain.  Increased rate of breathing.  Wheezing.  Mucus production. DIAGNOSIS  If you have the common symptoms of pneumonia, your health care provider will typically confirm the diagnosis with a chest X-ray. The X-ray will show an abnormality in the lung (pulmonary infiltrate) if you have pneumonia. Other tests of your blood, urine, or sputum may be done to find the specific cause of your pneumonia. Your health care provider may also do tests (blood gases or pulse oximetry) to see how well your lungs are working. TREATMENT  Some forms of pneumonia may be spread to other people when you cough or sneeze. You may be asked to wear a mask before and during your exam. Pneumonia that is caused by bacteria is treated with antibiotic medicine. Pneumonia that is caused by the influenza virus may be treated with an antiviral medicine. Most other viral infections must run their course. These infections will not respond to antibiotics.  HOME CARE INSTRUCTIONS   Cough suppressants may be used if you are losing too much rest. However, coughing protects you by clearing your lungs. You should avoid using cough suppressants if you can.  Your health care provider may have prescribed medicine if he or she thinks your pneumonia is caused by bacteria or influenza. Finish your medicine even if you start to feel better.  Your health care  provider may also prescribe an expectorant. This loosens the mucus to be coughed up.  Take medicines only as directed by your health care provider.  Do not smoke. Smoking is a common cause of bronchitis and can contribute to pneumonia. If you are a smoker and continue to smoke, your cough may last several weeks after your pneumonia has cleared.  A cold steam vaporizer or humidifier in your room or home may help loosen mucus.  Coughing is often worse at night. Sleeping in a semi-upright position in a recliner or using a couple pillows under your head will help with this.  Get rest as you feel it is needed. Your body will usually let you know when you need to rest. PREVENTION A pneumococcal shot (vaccine) is available to prevent a common bacterial cause of pneumonia. This is usually suggested for:  People over 45 years old.  Patients on chemotherapy.  People with chronic lung problems, such as bronchitis or emphysema.  People with immune system problems. If you are over 65 or have a high risk condition, you may receive the pneumococcal vaccine if you have not received it before. In some countries, a routine influenza vaccine is also recommended. This vaccine can help prevent some cases of pneumonia.You may be offered the influenza vaccine as part of your care. If you smoke, it is time to quit. You may receive instructions on how to stop smoking. Your health care provider can provide medicines and counseling to help you quit. SEEK MEDICAL CARE IF: You have a fever. SEEK IMMEDIATE MEDICAL CARE IF:   Your  illness becomes worse. This is especially true if you are elderly or weakened from any other disease.  You cannot control your cough with suppressants and are losing sleep.  You begin coughing up blood.  You develop pain which is getting worse or is uncontrolled with medicines.  Any of the symptoms which initially brought you in for treatment are getting worse rather than  better.  You develop shortness of breath or chest pain. MAKE SURE YOU:   Understand these instructions.  Will watch your condition.  Will get help right away if you are not doing well or get worse. Document Released: 10/21/2005 Document Revised: 03/07/2014 Document Reviewed: 01/10/2011 [facility] Patient Information 2015 [place], [place]. This information is not intended to replace advice given to you by your health care provider. Make sure you discuss any questions you have with your health care provider.

## 2015-06-01 NOTE — ED Provider Notes (Signed)
CSN: [id number]     Arrival date & time 06/01/15  1507 History   First MD Initiated Contact with Patient 06/01/15 1601     Chief Complaint  Patient presents with  . Cough   HPI  Patient is a 54 year old gentleman who says that about a month ago, he was eating an apple and choked on a small bite, went down the wrong way. Took him about 2 hours at that time to stop coughing. Since then, he has had spells of coughing, and pleuritic discomfort in the right lower chest. This pain has been increasing for the last 3 or 4 days, had a fever to 102 in the last day or 2. No history of asthma or bronchitis; he is a former smoker who quit 4-1/2 years ago. No change in chronic leg swelling. Needs to establish with a PCP.  History reviewed. No pertinent past medical history. History reviewed. No pertinent past surgical history. History reviewed. No pertinent family history. History  Substance Use Topics  . Smoking status: Former Smoker    Types: Cigarettes  . Smokeless tobacco: Former Systems developer    Quit date: 11/19/2010  . Alcohol Use: No    Review of Systems  All other systems reviewed and are negative.   Allergies  Review of patient's allergies indicates no known allergies.  Home Medications   Prior to Admission medications   Medication Sig Start Date End Date Taking? Authorizing Provider  butrans patch       Tramadol Other meds from [facility] list per patient        BP 120/87 mmHg  Pulse 90  Temp(Src) 99.1 F (37.3 C) (Oral)  Resp 16  SpO2 96% Physical Exam  Constitutional: He is oriented to person, place, and time.  Alert, nicely groomed Looks ill, but not toxic. Reclining on exam table, holds his right chest when changing position  HENT:  Head: Atraumatic.  Eyes:  Conjugate gaze, no eye redness/drainage  Neck: Neck supple.  Cardiovascular: Normal rate and regular rhythm.   Pulmonary/Chest:  Diminished breath sounds in the right base, with some rhonchi; coarse breath  sounds throughout Mildly increased respiratory effort at rest  Abdominal: Soft. He exhibits no distension.  Musculoskeletal: Normal range of motion.  Neurological: He is alert and oriented to person, place, and time.  Skin: Skin is warm. No pallor.  No cyanosis Slightly diaphoretic  Nursing note and vitals reviewed.   ED Course  Procedures  Imaging Review Dg Chest 2 View  06/01/2015   CLINICAL DATA:  Persistent cough after aspiration 3 weeks ago. Decreased right-sided breath sounds.  EXAM: CHEST  2 VIEW  COMPARISON:  Chest x-ray dated 05/07/2010 and chest CT dated 08/15/2013  FINDINGS: There is a patchy infiltrate at the right lung base posteriorly with peribronchial thickening in the right lower lobe. The patient has had resection of the posterior aspect of several lower right ribs.  The left lung is clear. Heart size and pulmonary vascularity are normal. No acute osseous abnormality.  IMPRESSION: Patchy infiltrate at the right lung base posteriorly. Previous resection of portions of several right lower posterior ribs.   Electronically Signed   By: Lorriane Shire M.D.   On: 06/01/2015 16:35   Medications  ipratropium-albuterol (DUONEB) 0.5-2.5 (3) MG/3ML nebulizer solution 3 mL (3 mLs Nebulization Given 06/01/15 1712)   Patient did not feel that duoneb tx relieved R lower chest discomfort  ECG in the urgent care demonstrated sinus rhythm at a rate of about 80  bpm. Baseline is somewhat erratic but there does not appear to be any acute ST or T-wave changes.  I do not appreciate any significant change compared to EKG of 11/21/2009, 18:53  Contour of QRS complexes appears similar to previous EKG dated 11/21/2009 at [facility].  QRS duration is 96 ms; QTC is 431 ms; QRS axis is 17  MDM   1. Right lower lobe pneumonia    Prescriptions for Augmentin and Tessalon were sent to the pharmacy. Patient is given Dr. Chinita Greenland office number to establish care. Chest x-ray should be repeated in 6-12 weeks, to  show that pneumonia resolves and does not require further investigation.    Sherlene Shams, MD 06/01/15 [phone]

## 2015-06-01 NOTE — ED Notes (Signed)
Pt states that he choked on a small piece of apple 3 weeks ago and has not stopped coughing, he does complain of congestion

## 2015-06-26 ENCOUNTER — Other Ambulatory Visit: Payer: Self-pay

## 2015-06-26 ENCOUNTER — Ambulatory Visit (INDEPENDENT_AMBULATORY_CARE_PROVIDER_SITE_OTHER): Payer: Commercial Managed Care - HMO | Admitting: Family Medicine

## 2015-06-26 ENCOUNTER — Encounter: Payer: Self-pay | Admitting: Family Medicine

## 2015-06-26 VITALS — BP 102/60 | HR 68 | Ht 71.0 in | Wt 239.4 lb

## 2015-06-26 DIAGNOSIS — G894 Chronic pain syndrome: Secondary | ICD-10-CM

## 2015-06-26 DIAGNOSIS — E1140 Type 2 diabetes mellitus with diabetic neuropathy, unspecified: Secondary | ICD-10-CM

## 2015-06-26 DIAGNOSIS — E669 Obesity, unspecified: Secondary | ICD-10-CM | POA: Diagnosis not present

## 2015-06-26 DIAGNOSIS — Z9889 Other specified postprocedural states: Secondary | ICD-10-CM

## 2015-06-26 DIAGNOSIS — E785 Hyperlipidemia, unspecified: Secondary | ICD-10-CM | POA: Diagnosis not present

## 2015-06-26 DIAGNOSIS — F329 Major depressive disorder, single episode, unspecified: Secondary | ICD-10-CM

## 2015-06-26 DIAGNOSIS — Z8639 Personal history of other endocrine, nutritional and metabolic disease: Secondary | ICD-10-CM | POA: Insufficient documentation

## 2015-06-26 DIAGNOSIS — I2510 Atherosclerotic heart disease of native coronary artery without angina pectoris: Secondary | ICD-10-CM

## 2015-06-26 DIAGNOSIS — N529 Male erectile dysfunction, unspecified: Secondary | ICD-10-CM | POA: Insufficient documentation

## 2015-06-26 DIAGNOSIS — E291 Testicular hypofunction: Secondary | ICD-10-CM

## 2015-06-26 DIAGNOSIS — G43909 Migraine, unspecified, not intractable, without status migrainosus: Secondary | ICD-10-CM | POA: Insufficient documentation

## 2015-06-26 DIAGNOSIS — I341 Nonrheumatic mitral (valve) prolapse: Secondary | ICD-10-CM | POA: Insufficient documentation

## 2015-06-26 DIAGNOSIS — F32A Depression, unspecified: Secondary | ICD-10-CM

## 2015-06-26 DIAGNOSIS — IMO0002 Reserved for concepts with insufficient information to code with codable children: Secondary | ICD-10-CM | POA: Insufficient documentation

## 2015-06-26 DIAGNOSIS — I10 Essential (primary) hypertension: Secondary | ICD-10-CM

## 2015-06-26 DIAGNOSIS — G47411 Narcolepsy with cataplexy: Secondary | ICD-10-CM | POA: Insufficient documentation

## 2015-06-26 DIAGNOSIS — B029 Zoster without complications: Secondary | ICD-10-CM | POA: Insufficient documentation

## 2015-06-26 DIAGNOSIS — Z978 Presence of other specified devices: Secondary | ICD-10-CM

## 2015-06-26 DIAGNOSIS — Z87898 Personal history of other specified conditions: Secondary | ICD-10-CM | POA: Insufficient documentation

## 2015-06-26 DIAGNOSIS — R7989 Other specified abnormal findings of blood chemistry: Secondary | ICD-10-CM

## 2015-06-26 DIAGNOSIS — H409 Unspecified glaucoma: Secondary | ICD-10-CM

## 2015-06-26 DIAGNOSIS — S0001XA Abrasion of scalp, initial encounter: Secondary | ICD-10-CM

## 2015-06-26 DIAGNOSIS — S61210A Laceration without foreign body of right index finger without damage to nail, initial encounter: Secondary | ICD-10-CM

## 2015-06-26 DIAGNOSIS — E781 Pure hyperglyceridemia: Secondary | ICD-10-CM | POA: Insufficient documentation

## 2015-06-26 DIAGNOSIS — E538 Deficiency of other specified B group vitamins: Secondary | ICD-10-CM

## 2015-06-26 MED ORDER — PREGABALIN 150 MG PO CAPS: 150.0000 mg | ORAL_CAPSULE | Freq: Two times a day (BID) | ORAL | Status: DC

## 2015-06-26 MED ORDER — METFORMIN HCL 1000 MG PO TABS: 1000.0000 mg | ORAL_TABLET | Freq: Two times a day (BID) | ORAL | Status: DC

## 2015-06-27 DIAGNOSIS — E538 Deficiency of other specified B group vitamins: Secondary | ICD-10-CM | POA: Insufficient documentation

## 2015-06-27 DIAGNOSIS — H409 Unspecified glaucoma: Secondary | ICD-10-CM | POA: Insufficient documentation

## 2015-06-27 LAB — COMPREHENSIVE METABOLIC PANEL
ALBUMIN: 4.2 g/dL (ref 3.5–5.5)
ALT: 34 IU/L (ref 0–44)
AST: 37 IU/L (ref 0–40)
Albumin/Globulin Ratio: 1.6 (ref 1.1–2.5)
Alkaline Phosphatase: 84 IU/L (ref 39–117)
BILIRUBIN TOTAL: 0.3 mg/dL (ref 0.0–1.2)
BUN / CREAT RATIO: 18 (ref 9–20)
BUN: 13 mg/dL (ref 6–24)
CALCIUM: 9.5 mg/dL (ref 8.7–10.2)
CHLORIDE: 93 mmol/L — AB (ref 97–108)
CO2: 30 mmol/L — AB (ref 18–29)
CREATININE: 0.74 mg/dL — AB (ref 0.76–1.27)
GFR calc non Af Amer: 105 mL/min/{1.73_m2} (ref >59)
GFR, EST AFRICAN AMERICAN: 121 mL/min/{1.73_m2} (ref >59)
GLUCOSE: 111 mg/dL — AB (ref 65–99)
Globulin, Total: 2.7 g/dL (ref 1.5–4.5)
Potassium: 3.9 mmol/L (ref 3.5–5.2)
Sodium: 141 mmol/L (ref 134–144)
TOTAL PROTEIN: 6.9 g/dL (ref 6.0–8.5)

## 2015-06-27 LAB — LIPID PANEL
CHOLESTEROL TOTAL: 104 mg/dL (ref 100–199)
Chol/HDL Ratio: 3.5 ratio units (ref 0.0–5.0)
HDL: 30 mg/dL — AB (ref >39)
LDL Calculated: 47 mg/dL (ref 0–99)
TRIGLYCERIDES: 134 mg/dL (ref 0–149)
VLDL Cholesterol Cal: 27 mg/dL (ref 5–40)

## 2015-06-27 LAB — CBC
HEMATOCRIT: 40.8 % (ref 37.5–51.0)
HEMOGLOBIN: 13.5 g/dL (ref 12.6–17.7)
MCH: 28.9 pg (ref 26.6–33.0)
MCHC: 33.1 g/dL (ref 31.5–35.7)
MCV: 87 fL (ref 79–97)
Platelets: 305 10*3/uL (ref 150–379)
RBC: 4.67 x10E6/uL (ref 4.14–5.80)
RDW: 14.4 % (ref 12.3–15.4)
WBC: 6.5 10*3/uL (ref 3.4–10.8)

## 2015-06-27 LAB — HEMOGLOBIN A1C
Est. average glucose Bld gHb Est-mCnc: 134 mg/dL
HEMOGLOBIN A1C: 6.3 % — AB (ref 4.8–5.6)

## 2015-06-27 LAB — MICROALBUMIN / CREATININE URINE RATIO
Creatinine, Urine: 173.3 mg/dL
MICROALB/CREAT RATIO: 5 mg/g{creat} (ref 0.0–30.0)
Microalbumin, Urine: 8.7 ug/mL

## 2015-06-27 LAB — TESTOSTERONE: Testosterone: 932 ng/dL (ref 348–1197)

## 2015-06-27 LAB — TSH: TSH: 0.829 u[IU]/mL (ref 0.450–4.500)

## 2015-06-27 NOTE — Progress Notes (Signed)
Date:  06/26/2015   Name:  Dale [name]   DOB:  1961/07/03   MRN:  [id number]  PCP:  Adline Potter, MD    Chief Complaint: Establish Care   History of Present Illness:  This is a 54 y.o. male with MMP previously followed at [facility] but discharged in March due to noncompliance. Hx CAD followed by cardiology, stress test 2014 ok per pt. Sees neurology for decreased feeling in feet, transferred records suggest diabetic neuropathy but pt thinks not related. HTN well controlled on HCTZ. Sees [facility] pain clinic for chronic pain since back surgery 2001, wants to transfer to Dr. Barbarann Ehlers at [facility] as he helped in past. Hx GERD takes PPI daily, has sign sxs if stops, nl EGD 2014 per pt. T2DM past one year well controlled on metformin. Depression since 1992 sees psychiatrist Dr. Robina Ade. Takes eye drops for glaucoma. Has 3 day old abrasion of scalp and superficial laceration of R index finger. Says he is on home-administered testosterone replacement but med list includes Depo-Estradiol only.  Review of Systems:  Review of Systems  Patient Active Problem List   Diagnosis Date Noted  . Cataplexy and narcolepsy 06/26/2015  . Billowing mitral valve 06/26/2015  . History of prolonged Q-T interval on ECG 06/26/2015  . Herniation of nucleus pulposus 06/26/2015  . Clinical depression 06/26/2015  . Low testosterone 06/26/2015  . Obesity (BMI 30.0-34.9) 06/26/2015  . CAD (coronary artery disease), native coronary artery 06/26/2015  . 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 Fatty infiltration of liver 10/21/2014  . Psoriasis 07/22/2014  . Acid reflux 07/22/2014  . Diabetes mellitus, type 2 06/22/2014  . HLD (hyperlipidemia) 06/22/2014  . Essential (primary) hypertension 06/22/2014  . Failed back syndrome of lumbar spine 04/29/2014  . Chronic pain associated with significant psychosocial dysfunction 04/29/2014  . Polypharmacy 09/09/2013    Prior to Admission medications    Medication Sig Start Date End Date Taking? Authorizing Provider  ALPRAZolam Duanne Moron) 1 MG tablet Take 1 tablet by mouth 4 (four) times daily. 05/15/15  Yes Historical Provider, MD  atorvastatin (LIPITOR) 10 MG tablet Take 1 tablet by mouth daily at 2 PM daily at 2 PM. 06/20/15  Yes Historical Provider, MD  baclofen (LIORESAL) 10 MG tablet Take 10 mg by mouth 4 (four) times daily.   Yes Historical Provider, MD  buPROPion (WELLBUTRIN SR) 100 MG 12 hr tablet Take 1 tablet by mouth daily with lunch. 06/20/15  Yes Historical Provider, MD  buPROPion (WELLBUTRIN SR) 200 MG 12 hr tablet Take 1 tablet by mouth daily with breakfast. 06/20/15  Yes Historical Provider, MD  BUTRANS 10 MCG/HR PTWK patch Apply 1 patch topically once a week. 06/05/15  Yes Historical Provider, MD  cyanocobalamin 1000 MCG tablet Take 100 mcg by mouth daily.   Yes Historical Provider, MD  desonide (DESOWEN) 0.05 % lotion Apply topically 2 (two) times daily.   Yes Historical Provider, MD  Dexmethylphenidate HCl 40 MG CP24 Take 1 capsule by mouth daily. 06/21/15  Yes Historical Provider, MD  estradiol cypionate (DEPO-ESTRADIOL) 5 MG/ML injection Inject 2.5 mg into the muscle once a week.   Yes Historical Provider, MD  EVZIO 0.4 MG/0.4ML SOAJ INJECT 1 CARTRIDGE AS DIRECTED EVERY 10 MINUTES AS NEEDED( IF UNCONSCIOUS AND NOT BREATHING) FOR UP TO 2 DOSES 06/19/15  Yes Historical Provider, MD  hydrochlorothiazide (HYDRODIURIL) 25 MG tablet Take 1 tablet by mouth daily at 2 PM daily at 2 PM. 06/20/15  Yes Historical Provider, MD  meloxicam (MOBIC) 7.5 MG tablet Take 7.5 mg by mouth 2 (two) times daily.   Yes Historical Provider, MD  Multiple Vitamin (MULTIVITAMIN) tablet Take 1 tablet by mouth daily.   Yes Historical Provider, MD  ondansetron (ZOFRAN) 4 MG tablet Take 4 mg by mouth every 8 (eight) hours as needed for nausea or vomiting.   Yes Historical Provider, MD  pantoprazole (PROTONIX) 40 MG tablet Take 1 tablet by mouth daily at 2 PM daily at 2  PM. 06/20/15  Yes Historical Provider, MD  traMADol (ULTRAM) 50 MG tablet Take 2 tablets by mouth 4 (four) times daily as needed. 05/16/15  Yes Historical Provider, MD  zolpidem (AMBIEN) 10 MG tablet Take 1 tablet by mouth at bedtime. 05/15/15  Yes Historical Provider, MD  hydrOXYzine (VISTARIL) 25 MG capsule Take 1 capsule by mouth 2 (two) times daily. 06/01/15   Historical Provider, MD  metFORMIN (GLUCOPHAGE) 1000 MG tablet Take 1 tablet (1,000 mg total) by mouth 2 (two) times daily with a meal. 06/26/15   Adline Potter, MD  pregabalin (LYRICA) 150 MG capsule Take 1 capsule (150 mg total) by mouth 2 (two) times daily. 06/26/15   Adline Potter, MD    Allergies  Allergen Reactions  . Benzoin Other (See Comments)    blisters  . Morphine Other (See Comments)  . Penicillins Other (See Comments)    No past surgical history on file.  Social History  Substance Use Topics  . Smoking status: Former Smoker    Types: Cigarettes  . Smokeless tobacco: Former Systems developer    Quit date: 11/19/2010  . Alcohol Use: No    No family history on file.  Medication list has been reviewed and updated.  Physical Examination: BP 102/60 mmHg  Pulse 68  Ht 5\' 11"  (1.803 m)  Wt 239 lb 6.4 oz (108.591 kg)  BMI 33.40 kg/m2  Physical Exam  Constitutional: He appears well-developed and well-nourished.  HENT:  Head: Normocephalic.  Right Ear: External ear normal.  Left Ear: External ear normal.  Nose: Nose normal.  Mouth/Throat: Oropharynx is clear and moist.  Small abrasion superior scalp, healing well  Eyes: Conjunctivae and EOM are normal. Pupils are equal, round, and reactive to light. Right eye exhibits no discharge. Left eye exhibits no discharge. No scleral icterus.  Neck: Neck supple. No thyromegaly present.  Cardiovascular: Normal rate, regular rhythm and normal heart sounds.   Pulmonary/Chest: Effort normal and breath sounds normal.  Abdominal: Soft. He exhibits no distension and no mass. There is no  tenderness.  Musculoskeletal: He exhibits no edema.  Lymphadenopathy:    He has no cervical adenopathy.  Neurological: He is alert. Coordination normal.  Skin: Skin is warm and dry. No rash noted.  2 cm superficial laceration R index finger  Psychiatric: He has a normal mood and affect. His behavior is normal.  Nursing note and vitals reviewed.   Assessment and Plan:  1. Essential (primary) hypertension Well controlled, continue HCTZ - Comprehensive metabolic panel - CBC  2. HLD (hyperlipidemia) Unclear control - Lipid Profile  3. Low testosterone Per patient report, unclear if on testosterone or estradiol injections - Testosterone  4. Type 2 diabetes mellitus with diabetic neuropathy Unclear control, refill metformin and Lyrica - HgB A1c - Urine Microalbumin w/creat. ratio  5. Clinical depression Followed by psychiatry, on multiple psych meds - TSH  6. Chronic pain associated with significant psychosocial dysfunction Followed by [facility], wants to transfer to [facility] - Ambulatory referral to  Pain Clinic  7. Obesity (BMI 30.0-34.9)  8. Coronary artery disease involving native coronary artery of native heart without angina pectoris Followed by cardiology, continue statin, consider asa therapy  9. Presence of intrathecal pump Followed by Pain Clinic  Over 1 hour spent with patient, reviewing PMH and updating medication list, over half in counseling.  Return in about 4 weeks (around 07/24/2015).  Satira Anis. [facility]  06/27/2015

## 2015-07-19 ENCOUNTER — Ambulatory Visit (INDEPENDENT_AMBULATORY_CARE_PROVIDER_SITE_OTHER): Payer: Commercial Managed Care - HMO | Admitting: Family Medicine

## 2015-07-19 ENCOUNTER — Encounter: Payer: Self-pay | Admitting: Family Medicine

## 2015-07-19 VITALS — BP 110/64 | HR 72 | Ht 71.5 in | Wt 232.6 lb

## 2015-07-19 DIAGNOSIS — G47411 Narcolepsy with cataplexy: Secondary | ICD-10-CM

## 2015-07-19 DIAGNOSIS — E1140 Type 2 diabetes mellitus with diabetic neuropathy, unspecified: Secondary | ICD-10-CM

## 2015-07-19 DIAGNOSIS — R7989 Other specified abnormal findings of blood chemistry: Secondary | ICD-10-CM

## 2015-07-19 DIAGNOSIS — G894 Chronic pain syndrome: Secondary | ICD-10-CM | POA: Diagnosis not present

## 2015-07-19 DIAGNOSIS — I2510 Atherosclerotic heart disease of native coronary artery without angina pectoris: Secondary | ICD-10-CM

## 2015-07-19 DIAGNOSIS — E669 Obesity, unspecified: Secondary | ICD-10-CM

## 2015-07-19 DIAGNOSIS — E66811 Obesity, class 1: Secondary | ICD-10-CM

## 2015-07-19 DIAGNOSIS — E291 Testicular hypofunction: Secondary | ICD-10-CM | POA: Diagnosis not present

## 2015-07-19 DIAGNOSIS — I10 Essential (primary) hypertension: Secondary | ICD-10-CM

## 2015-07-19 DIAGNOSIS — F329 Major depressive disorder, single episode, unspecified: Secondary | ICD-10-CM

## 2015-07-19 DIAGNOSIS — Z23 Encounter for immunization: Secondary | ICD-10-CM

## 2015-07-19 DIAGNOSIS — F32A Depression, unspecified: Secondary | ICD-10-CM

## 2015-07-19 DIAGNOSIS — E785 Hyperlipidemia, unspecified: Secondary | ICD-10-CM | POA: Diagnosis not present

## 2015-07-19 MED ORDER — DESOXIMETASONE 0.25 % EX CREA: 1.0000 "application " | TOPICAL_CREAM | Freq: Two times a day (BID) | CUTANEOUS | Status: DC | PRN

## 2015-07-21 MED ORDER — ASPIRIN 81 MG PO TABS: 81.0000 mg | ORAL_TABLET | Freq: Every day | ORAL | Status: DC

## 2015-07-21 NOTE — Progress Notes (Signed)
Date:  07/19/2015   Name:  Dale [name]   DOB:  10-18-1961   MRN:  [id number]  PCP:  Adline Potter, MD    Chief Complaint: Diabetes; Depression; and Hyperlipidemia   History of Present Illness:  This is a 54 y.o. male for f/u MMP including DM, HTN, HLD, and CAD, all stable per patient. Has depression, narcolepsy, and insomnia followed by psych but needs new provider as current one does not take his insurance. Scheduled to see [facility] 11/10/15. Not taking any asa. Needs tetanus, declines flu. Father with CAD, mother and sister with DM. Weight down 7# despite no exercise due to pain. Needs refill lotion for psoriasis. Has occ BLE edema but resolves overnight.  Review of Systems:  Review of Systems  Constitutional: Negative for fever, chills and unexpected weight change.  Respiratory: Negative for shortness of breath.   Cardiovascular: Negative for chest pain.  Gastrointestinal: Negative for abdominal pain.  Endocrine: Negative for polyuria.  Genitourinary: Negative for difficulty urinating.    Patient Active Problem List   Diagnosis Date Noted  . Glaucoma 06/27/2015  . B12 deficiency 06/27/2015  . Cataplexy and narcolepsy 06/26/2015  . Billowing mitral valve 06/26/2015  . History of prolonged Q-T interval on ECG 06/26/2015  . Herniation of nucleus pulposus 06/26/2015  . Clinical depression 06/26/2015  . Low testosterone 06/26/2015  . Obesity (BMI 30.0-34.9) 06/26/2015  . CAD (coronary artery disease), native coronary artery 06/26/2015  . 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 Fatty infiltration of liver 10/21/2014  . Psoriasis 07/22/2014  . Acid reflux 07/22/2014  . Type 2 diabetes, controlled, with neuropathy 06/22/2014  . HLD (hyperlipidemia) 06/22/2014  . Essential (primary) hypertension 06/22/2014  . Failed back syndrome of lumbar spine 04/29/2014  . Chronic pain associated with significant psychosocial dysfunction 04/29/2014  . Polypharmacy 09/09/2013     Prior to Admission medications   Medication Sig Start Date End Date Taking? Authorizing Provider  ALPRAZolam Duanne Moron) 1 MG tablet Take 1 tablet by mouth 4 (four) times daily. 05/15/15  Yes Historical Provider, MD  atorvastatin (LIPITOR) 10 MG tablet Take 1 tablet by mouth daily at 2 PM daily at 2 PM. 06/20/15  Yes Historical Provider, MD  baclofen (LIORESAL) 10 MG tablet Take 10 mg by mouth 4 (four) times daily.   Yes Historical Provider, MD  buPROPion (WELLBUTRIN SR) 100 MG 12 hr tablet Take 1 tablet by mouth daily with lunch. 06/20/15  Yes Historical Provider, MD  buPROPion (WELLBUTRIN SR) 200 MG 12 hr tablet Take 1 tablet by mouth daily with breakfast. 06/20/15  Yes Historical Provider, MD  BUTRANS 10 MCG/HR PTWK patch Apply 1 patch topically once a week. 06/05/15  Yes Historical Provider, MD  cyanocobalamin 1000 MCG tablet Take 100 mcg by mouth daily.   Yes Historical Provider, MD  Dexmethylphenidate HCl 40 MG CP24 Take 1 capsule by mouth daily. 06/21/15  Yes Historical Provider, MD  estradiol cypionate (DEPO-ESTRADIOL) 5 MG/ML injection Inject 2.5 mg into the muscle once a week.   Yes Historical Provider, MD  EVZIO 0.4 MG/0.4ML SOAJ INJECT 1 CARTRIDGE AS DIRECTED EVERY 10 MINUTES AS NEEDED( IF UNCONSCIOUS AND NOT BREATHING) FOR UP TO 2 DOSES 06/19/15  Yes Historical Provider, MD  hydrochlorothiazide (HYDRODIURIL) 25 MG tablet Take 1 tablet by mouth daily at 2 PM daily at 2 PM. 06/20/15  Yes Historical Provider, MD  hydrOXYzine (VISTARIL) 25 MG capsule Take 1 capsule by mouth 2 (two) times daily. 06/01/15  Yes Historical Provider,  MD  meloxicam (MOBIC) 7.5 MG tablet Take 7.5 mg by mouth 2 (two) times daily.   Yes Historical Provider, MD  metFORMIN (GLUCOPHAGE) 1000 MG tablet Take 1 tablet (1,000 mg total) by mouth 2 (two) times daily with a meal. 06/26/15  Yes Adline Potter, MD  Multiple Vitamin (MULTIVITAMIN) tablet Take 1 tablet by mouth daily.   Yes Historical Provider, MD  ondansetron (ZOFRAN) 4  MG tablet Take 4 mg by mouth every 8 (eight) hours as needed for nausea or vomiting.   Yes Historical Provider, MD  pantoprazole (PROTONIX) 40 MG tablet Take 1 tablet by mouth daily at 2 PM daily at 2 PM. 06/20/15  Yes Historical Provider, MD  zolpidem (AMBIEN) 10 MG tablet Take 1 tablet by mouth at bedtime. 05/15/15  Yes Historical Provider, MD  desoximetasone (TOPICORT) 0.25 % cream Apply 1 application topically 2 (two) times daily as needed. 07/19/15   Adline Potter, MD  traMADol (ULTRAM) 50 MG tablet Take 2 tablets by mouth 4 (four) times daily as needed. 05/16/15   Historical Provider, MD    Allergies  Allergen Reactions  . Benzoin Other (See Comments)    blisters  . Morphine Other (See Comments)  . Penicillins Other (See Comments)    No past surgical history on file.  Social History  Substance Use Topics  . Smoking status: Former Smoker    Types: Cigarettes  . Smokeless tobacco: Former Systems developer    Quit date: 11/19/2010  . Alcohol Use: No    No family history on file.  Medication list has been reviewed and updated.  Physical Examination: BP 110/64 mmHg  Pulse 72  Ht 5' 11.5" (1.816 m)  Wt 232 lb 9.6 oz (105.507 kg)  BMI 31.99 kg/m2  Physical Exam  Constitutional: He is oriented to person, place, and time. He appears well-developed and well-nourished.  HENT:  Head: Normocephalic and atraumatic.  Right Ear: External ear normal.  Left Ear: External ear normal.  Nose: Nose normal.  Mouth/Throat: Oropharynx is clear and moist.  Eyes: Conjunctivae and EOM are normal. Pupils are equal, round, and reactive to light.  Neck: Neck supple. No thyromegaly present.  Cardiovascular: Normal rate, regular rhythm, normal heart sounds and intact distal pulses.   Pulmonary/Chest: Effort normal and breath sounds normal.  Abdominal: Soft. He exhibits no distension and no mass. There is no tenderness.  Musculoskeletal: He exhibits no edema.  Lymphadenopathy:    He has no cervical  adenopathy.  Neurological: He is alert and oriented to person, place, and time. Coordination normal.  Skin: Skin is warm and dry.  Psychiatric: His behavior is normal.  Flat affect  Nursing note and vitals reviewed.   Assessment and Plan:  1. Type 2 diabetes, controlled, with neuropathy Well controlled (a1c 6.3 last month), continue current regimen  2. Essential (primary) hypertension Well controlled, continue current regimen  3. Coronary artery disease involving native coronary artery of native heart without angina pectoris Well controlled, begin asa 81 mg daily  4. HLD (hyperlipidemia) Well controlled, (LDL 47 last month)  5. Clinical depression Well controlled, continue current regimen, followed by psych - Ambulatory referral to Psychiatry  6. Cataplexy and narcolepsy Well controlled, continue current regimen, followed by psych - Ambulatory referral to Psychiatry  7. Chronic pain associated with significant psychosocial dysfunction Switching to [facility], appt pending  8. Low testosterone Level normal on replacement, pt to call to clarify medication/dose  9. Obesity (BMI 30.0-34.9) Continue weight loss  10. Delayed imms Tdap  today, flu declined  Return in about 3 months (around 10/18/2015).  Satira Anis. [facility]  07/21/2015

## 2015-08-08 ENCOUNTER — Ambulatory Visit: Payer: Commercial Managed Care - HMO | Admitting: Licensed Clinical Social Worker

## 2015-08-18 ENCOUNTER — Ambulatory Visit (INDEPENDENT_AMBULATORY_CARE_PROVIDER_SITE_OTHER): Payer: 59 | Admitting: Licensed Clinical Social Worker

## 2015-08-18 DIAGNOSIS — F329 Major depressive disorder, single episode, unspecified: Secondary | ICD-10-CM | POA: Diagnosis not present

## 2015-08-18 DIAGNOSIS — F0631 Mood disorder due to known physiological condition with depressive features: Secondary | ICD-10-CM
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
## 2015-08-29 ENCOUNTER — Encounter: Payer: Self-pay | Admitting: Psychiatry

## 2015-08-29 ENCOUNTER — Ambulatory Visit (INDEPENDENT_AMBULATORY_CARE_PROVIDER_SITE_OTHER): Payer: 59 | Admitting: Psychiatry

## 2015-08-29 VITALS — BP 122/86 | HR 115 | Temp 98.3°F | Ht 71.5 in | Wt 236.8 lb

## 2015-08-29 DIAGNOSIS — Z8679 Personal history of other diseases of the circulatory system: Secondary | ICD-10-CM | POA: Insufficient documentation

## 2015-08-29 DIAGNOSIS — F413 Other mixed anxiety disorders: Secondary | ICD-10-CM | POA: Diagnosis not present

## 2015-08-29 DIAGNOSIS — F331 Major depressive disorder, recurrent, moderate: Secondary | ICD-10-CM | POA: Diagnosis not present

## 2015-08-29 MED ORDER — DEXMETHYLPHENIDATE HCL ER 10 MG PO CP24: 10.0000 mg | ORAL_CAPSULE | Freq: Every day | ORAL | Status: DC

## 2015-08-29 MED ORDER — ALPRAZOLAM 0.5 MG PO TABS: 0.5000 mg | ORAL_TABLET | Freq: Three times a day (TID) | ORAL | Status: DC

## 2015-08-29 MED ORDER — ZOLPIDEM TARTRATE 10 MG PO TABS: 10.0000 mg | ORAL_TABLET | Freq: Every day | ORAL | Status: DC

## 2015-08-29 MED ORDER — PAROXETINE HCL 40 MG PO TABS: 40.0000 mg | ORAL_TABLET | ORAL | Status: DC

## 2015-08-29 MED ORDER — BUPROPION HCL ER (XL) 150 MG PO TB24: 150.0000 mg | ORAL_TABLET | Freq: Every day | ORAL | Status: DC

## 2015-08-29 NOTE — Progress Notes (Signed)
Psychiatric Initial Adult Assessment   Patient Identification: Dale [name] MRN:  [id number] Date of Evaluation:  08/29/2015 Referral Source: [place].  Chief Complaint:   Chief Complaint    Establish Care; Depression; Anxiety; Panic Attack; Fatigue; Stress     Visit Diagnosis:    ICD-9-CM ICD-10-CM   1. MDD (major depressive disorder), recurrent episode, moderate (HCC) 296.32 F33.1   2. Other mixed anxiety disorders 300.09 F41.3    Diagnosis:   Patient Active Problem List   Diagnosis Date Noted  . Diabetes mellitus ([place]) [E11.9] 08/29/2015  . Personal history of other diseases of the circulatory system [Z86.79] 08/29/2015  . Glaucoma [H40.9] 06/27/2015  . B12 deficiency [E53.8] 06/27/2015  . 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 Billowing mitral valve [I34.1] 06/26/2015  . History of prolonged Q-T interval on ECG [Z86.79] 06/26/2015  . 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 Clinical depression [F32.9] 06/26/2015  . Low testosterone [E29.1] 06/26/2015  . Obesity (BMI 30.0-34.9) [E66.9] 06/26/2015  . CAD (coronary artery disease), native coronary artery [I25.10] 06/26/2015  . 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 Fatty infiltration of liver [K76.0] 10/21/2014  . Psoriasis [L40.9] 07/22/2014  . Acid reflux [K21.9] 07/22/2014  . Type 2 diabetes, controlled, with neuropathy ([place]) [E11.40] 06/22/2014  . HLD (hyperlipidemia) [E78.5] 06/22/2014  . Essential (primary) hypertension [I10] 06/22/2014  . Type 2 diabetes mellitus ([place]) [E11.9] 06/22/2014  . 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 Chronic pain associated with significant psychosocial dysfunction [G89.4] 04/29/2014  . History of surgical procedure [Z98.890] 04/29/2014  . Polypharmacy [Z79.899] 09/09/2013  . Other long term (current) drug therapy [Z79.899] 11/20/1998   History of Present Illness:    Patient is a 54 year old male male who  was referred from [facility]. He reported that he has long history of depression which was diagnosed in 2008 after he had back surgery  and has been seeing pain management since 2001. He reported that he retired from [employer] in 2004. He reported that he is currently wearing a pain management pump and has been getting  Dilaudid from the same. He is also taking oral pain medications. He reported that he is concerned about his psychotropic medications as he does not want to mix Paxil with Tramadol.Marland Kitchen He reported that he has not noticed any improvement on his current medications as he has been taking a combination of Paxil and Wellbutrin. He reported that he has also noticed tremors in his hands which have been worsening. He feels very tired throughout the day and was given Focalin by Dr. Toy Care to increase his energy level as he was unable to focus and concentrate. He spends his time at home taking care of his pets as well as working around the house. Patient reported that he has been diagnosed with narcolepsy as well. He reported that he sleeps well with the help of Ambien but sometimes he will wake up after 4 hours. He takes a combination of Xanax and Ambien at night. Patient reported that he does not remember if he has been tried on other psychotropic medications at this time. He reported that [place] and he walked approximately 5 miles. He enjoyed the trip but was unable to walk the next day. He reported that he takes Xanax 4 times daily as he feels anxious from inside but he feels that the medication dose is too high and is interested in cutting down  the dose of his medications. He currently denied having any perceptual disturbances. He appeared tremulous during the interview. He currently denied having any suicidal homicidal ideations or plans.  He reported that he was also referred for ECT by Dr. Toy Care but he has not tried several medications  so he thinks that  he will probably delay it for a while. He is interested in having his medications adjusted.   Elements:  Location:  depression and anxiety. Associated Signs/Symptoms: Depression Symptoms:  depressed mood, insomnia, psychomotor retardation, fatigue, feelings of worthlessness/guilt, difficulty concentrating, hopelessness, anxiety, loss of energy/fatigue, weight loss, decreased appetite, (Hypo) Manic Symptoms:  none Anxiety Symptoms:  Excessive Worry, Social Anxiety, Psychotic Symptoms:  none PTSD Symptoms: Negative NA  Past Medical History:  Past Medical History  Diagnosis Date  . ADHD (attention deficit hyperactivity disorder)   . Anxiety   . Heart disease   . Diabetes mellitus, type II [facility])     Past Surgical History  Procedure Laterality Date  . Throat surgery    . Spine surgery     Family History:  Family History  Problem Relation Age of Onset  . Depression Mother   . Anxiety disorder Mother   . Diabetes Mother   . Heart failure Mother   . Heart attack Father   . Diabetes Sister   . Obesity Sister   . Drug abuse Brother   . Alcohol abuse Brother   . Depression Sister   . Anxiety disorder Sister   . Drug abuse Sister   . Anxiety disorder Sister   . Seizures Sister   . Diabetes Sister   . Depression Sister    Social History:   Social History   Social History  . Marital Status: Married    Spouse Name: N/A  . Number of Children: N/A  . Years of Education: N/A   Social History Main Topics  . Smoking status: Former Smoker    Types: Cigarettes    Start date: 08/29/1979    Quit date: 11/04/2010  . Smokeless tobacco: Former Systems developer    Quit date: 11/19/2010  . Alcohol Use: No  . Drug Use: No  . Sexual Activity: Not Currently   Other Topics Concern  . None   Social History Narrative   Additional Social History: Married x 28 years. Retied from [employer]. Wife is Freight forwarder at [place] [employer] [place]. Has a daughter, 3 yo. Pt is on  disability due to back pain  and depression.   Musculoskeletal: Strength & Muscle Tone: decreased Gait & Station: normal Patient leans: N/A  Psychiatric Specialty Exam: HPI  ROS  Blood pressure 122/86, pulse 115, temperature 98.3 F (36.8 C), temperature source Tympanic, height 5' 11.5" (1.816 m), weight 236 lb 12.8 oz (107.412 kg), SpO2 90 %.Body mass index is 32.57 kg/(m^2).  General Appearance: Casual  Eye Contact:  Fair  Speech:  Clear and Coherent  Volume:  Normal  Mood:  Anxious and Depressed  Affect:  Appropriate and Blunt  Thought Process:  Coherent and Goal Directed  Orientation:  Full (Time, Place, and Person)  Thought Content:  WDL  Suicidal Thoughts:  No  Homicidal Thoughts:  No  Memory:  Immediate;   Fair  Judgement:  Fair  Insight:  Fair  Psychomotor Activity:  Psychomotor Retardation  Concentration:  Fair  Recall:  [employer] of Knowledge:Fair  Language: Fair  Akathisia:  No  Handed:  Right  AIMS (if indicated):    Assets:  Communication Skills Desire for  Improvement Social Support  ADL's:  Intact  Cognition: WNL  Sleep:  4-5   Is the patient at risk to self?  No. Has the patient been a risk to self in the past 6 months?  No. Has the patient been a risk to self within the distant past?  No. Is the patient a risk to others?  No. Has the patient been a risk to others in the past 6 months?  No. Has the patient been a risk to others within the distant past?  No.  Allergies:   Allergies  Allergen Reactions  . Benzoin Other (See Comments)    blisters  . Morphine Other (See Comments)  . Penicillins Other (See Comments)   Current Medications: Current Outpatient Prescriptions  Medication Sig Dispense Refill  . ALPRAZolam (XANAX) 1 MG tablet Take 1 tablet by mouth 4 (four) times daily.    Marland Kitchen aspirin 81 MG tablet Take 1 tablet (81 mg total) by mouth daily. 30 tablet 0  . atorvastatin (LIPITOR) 10 MG tablet Take 1 tablet by mouth daily at 2 PM daily at 2 PM.    . baclofen  (LIORESAL) 10 MG tablet Take 10 mg by mouth 4 (four) times daily.    Marland Kitchen buPROPion (WELLBUTRIN SR) 100 MG 12 hr tablet Take 1 tablet by mouth daily with lunch.    Marland Kitchen buPROPion (WELLBUTRIN SR) 200 MG 12 hr tablet Take 1 tablet by mouth daily with breakfast.    . BUTRANS 10 MCG/HR PTWK patch Apply 1 patch topically once a week.    . cyanocobalamin 1000 MCG tablet Take 100 mcg by mouth daily.    Marland Kitchen desoximetasone (TOPICORT) 0.25 % cream Apply 1 application topically 2 (two) times daily as needed. 60 g 2  . Dexmethylphenidate HCl 40 MG CP24 Take 1 capsule by mouth daily.  0  . Doxepin HCl (SILENOR) 6 MG TABS Take by mouth.    . estradiol cypionate (DEPO-ESTRADIOL) 5 MG/ML injection Inject 2.5 mg into the muscle once a week.    Marland Kitchen EVZIO 0.4 MG/0.4ML SOAJ INJECT 1 CARTRIDGE AS DIRECTED EVERY 10 MINUTES AS NEEDED( IF UNCONSCIOUS AND NOT BREATHING) FOR UP TO 2 DOSES  0  . gabapentin (NEURONTIN) 300 MG capsule Take 300 mg by mouth at bedtime.  2  . hydrochlorothiazide (HYDRODIURIL) 25 MG tablet Take 1 tablet by mouth daily at 2 PM daily at 2 PM.    . hydrOXYzine (VISTARIL) 25 MG capsule Take 1 capsule by mouth 2 (two) times daily.    Marland Kitchen latanoprost (XALATAN) 0.005 % ophthalmic solution Administer 1 drop to both eyes nightly.     . meloxicam (MOBIC) 7.5 MG tablet Take 7.5 mg by mouth 2 (two) times daily.    . metFORMIN (GLUCOPHAGE) 1000 MG tablet Take 1 tablet (1,000 mg total) by mouth 2 (two) times daily with a meal. 180 tablet 3  . Multiple Vitamin (MULTIVITAMIN) tablet Take 1 tablet by mouth daily.    . ondansetron (ZOFRAN) 4 MG tablet Take 4 mg by mouth every 8 (eight) hours as needed for nausea or vomiting.    . pantoprazole (PROTONIX) 40 MG tablet Take 1 tablet by mouth daily at 2 PM daily at 2 PM.    . PARoxetine (PAXIL) 40 MG tablet Take 40 mg by mouth.    . traMADol (ULTRAM) 50 MG tablet Take 2 tablets by mouth 4 (four) times daily as needed.  2  . zolpidem (AMBIEN) 10 MG tablet Take 1 tablet by mouth  at bedtime.     No current facility-administered medications for this visit.    Previous Psychotropic Medications: Taking most of the medications since 2001-2002.  wellbutrin since 2008    Substance Abuse History in the last 12 months:  No.  Consequences of Substance Abuse: Negative NA  Medical Decision Making:  Review of Psycho-Social Stressors (1)  Treatment Plan Summary: Medication management   Discussed with patient about his medications. I will adjust his medications as follows.  Depression and anxiety  I will decrease the dose of Wellbutrin XL 150 mg in the morning. He will continue on Paxil 40 mg by mouth daily.  I will also decrease the Xanax to 0.5 mg by mouth 3 times a day. Patient is currently experiencing tremors in his hands and is also having memory issues related to the same.  Insomnia He will continue on Ambien 10 mg at bedtime   stimulant medication Patient will continue on Focalin XR 20 mg in the morning. He reported that it is very expensive and he will try to find a coupon for the same.  Follow-up appointment 3 weeks or earlier   More than 50% of the time spent in psychoeducation, counseling and coordination of care.    This note was generated in part or whole with voice recognition software. Voice regonition is usually quite accurate but there are transcription errors that can and very often do occur. I apologize for any typographical errors that were not detected and corrected.     Rainey Pines 10/25/20161:41 PM

## 2015-09-01 ENCOUNTER — Institutional Professional Consult (permissible substitution): Payer: Self-pay | Admitting: Psychiatry

## 2015-09-11 ENCOUNTER — Telehealth: Payer: Self-pay

## 2015-09-11 NOTE — Telephone Encounter (Signed)
pt called states he is have anxiety attacks and his depression is worse. pt states that you seen him and made changes to his medication and he feel worse since the changes. pt was seen on 08-29-15 next appt 09-19-15. Pt states you changed his xanax from 1mg  4times a day to .5mg  3 times a day.  And you changed his wellbutrin also.

## 2015-09-18 NOTE — Telephone Encounter (Signed)
Will discuss the medication change at his next appointment. Klonopin TID should be enough for his anxiety. Please ask him to see therapist

## 2015-09-19 ENCOUNTER — Ambulatory Visit: Payer: Commercial Managed Care - HMO | Admitting: Psychiatry

## 2015-09-19 NOTE — Telephone Encounter (Signed)
pt did have an appt for 09-19-15 but pt called and r/s appt for the 09-21-15.

## 2015-09-21 ENCOUNTER — Ambulatory Visit: Payer: Commercial Managed Care - HMO | Admitting: Psychiatry

## 2015-09-26 ENCOUNTER — Encounter: Payer: Self-pay | Admitting: Psychiatry

## 2015-09-26 ENCOUNTER — Ambulatory Visit (INDEPENDENT_AMBULATORY_CARE_PROVIDER_SITE_OTHER): Payer: 59 | Admitting: Psychiatry

## 2015-09-26 VITALS — BP 142/90 | HR 112 | Temp 97.2°F | Ht 71.5 in | Wt 238.0 lb

## 2015-09-26 DIAGNOSIS — F419 Anxiety disorder, unspecified: Secondary | ICD-10-CM | POA: Diagnosis not present

## 2015-09-26 DIAGNOSIS — F331 Major depressive disorder, recurrent, moderate: Secondary | ICD-10-CM

## 2015-09-26 DIAGNOSIS — R251 Tremor, unspecified: Secondary | ICD-10-CM

## 2015-09-26 DIAGNOSIS — G4700 Insomnia, unspecified: Secondary | ICD-10-CM | POA: Diagnosis not present

## 2015-09-26 MED ORDER — HYDROXYZINE PAMOATE 25 MG PO CAPS: 25.0000 mg | ORAL_CAPSULE | Freq: Two times a day (BID) | ORAL | Status: DC

## 2015-09-26 MED ORDER — ZOLPIDEM TARTRATE 10 MG PO TABS: 10.0000 mg | ORAL_TABLET | Freq: Every day | ORAL | Status: DC

## 2015-09-26 MED ORDER — BUPROPION HCL ER (XL) 150 MG PO TB24: 150.0000 mg | ORAL_TABLET | Freq: Every day | ORAL | Status: DC

## 2015-09-26 MED ORDER — PAROXETINE HCL 40 MG PO TABS: 40.0000 mg | ORAL_TABLET | ORAL | Status: DC

## 2015-09-26 MED ORDER — ALPRAZOLAM 0.5 MG PO TABS: 0.5000 mg | ORAL_TABLET | Freq: Three times a day (TID) | ORAL | Status: DC

## 2015-09-26 MED ORDER — QUETIAPINE FUMARATE 100 MG PO TABS: 100.0000 mg | ORAL_TABLET | Freq: Every day | ORAL | Status: DC

## 2015-09-26 NOTE — Progress Notes (Signed)
Psychiatric MD/ NP Note  Patient Identification: Dale [name] MRN:  [id number] Date of Evaluation:  09/26/2015 Referral Source: [facility].  Chief Complaint:   Chief Complaint    Follow-up; Medication Refill; Medication Problem     Visit Diagnosis:    ICD-9-CM ICD-10-CM   1. MDD (major depressive disorder), recurrent episode, moderate (HCC) 296.32 F33.1    Diagnosis:   Patient Active Problem List   Diagnosis Date Noted  . Diabetes mellitus ([place]) [E11.9] 08/29/2015  . Personal history of other diseases of the circulatory system [Z86.79] 08/29/2015  . Glaucoma [H40.9] 06/27/2015  . B12 deficiency [E53.8] 06/27/2015  . 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 Billowing mitral valve [I34.1] 06/26/2015  . History of prolonged Q-T interval on ECG [Z86.79] 06/26/2015  . 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 Clinical depression [F32.9] 06/26/2015  . Low testosterone [E29.1] 06/26/2015  . Obesity (BMI 30.0-34.9) [E66.9] 06/26/2015  . CAD (coronary artery disease), native coronary artery [I25.10] 06/26/2015  . 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 Fatty infiltration of liver [K76.0] 10/21/2014  . Psoriasis [L40.9] 07/22/2014  . Acid reflux [K21.9] 07/22/2014  . Type 2 diabetes, controlled, with neuropathy ([place]) [E11.40] 06/22/2014  . HLD (hyperlipidemia) [E78.5] 06/22/2014  . Essential (primary) hypertension [I10] 06/22/2014  . Type 2 diabetes mellitus ([place]) [E11.9] 06/22/2014  . 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 Chronic pain associated with significant psychosocial dysfunction [G89.4] 04/29/2014  . History of surgical procedure [Z98.890] 04/29/2014  . Polypharmacy [Z79.899] 09/09/2013  . Other long term (current) drug therapy [Z79.899] 11/20/1998   History of Present Illness:    Patient is a 54 year old male male who presented for the follow-up appointment. He reported that he was feeling more  depressed after his medications were adjusted at the last appointment. He reported that his blood pressure went high and he decided to take increased dose of the Xanax and called his primary care physician who advised him to take his blood pressure medications twice daily. Pt  reported that he continues to have tremors in his hands and he is not sure about the reason for the same. He is also unable to get the Focalin as it is too expensive. He remains focused on his medications. He reported that he also  bumped up the doses of the Wellbutrin and has been taking 200 mg pills as well as one 150 mg pills for his depressive symptoms. Patient reported that the combination of the medications are not helping him and he is not stable and having depressive symptoms with insomnia although he takes the Xanax and the Ambien at bedtime. He feels that his mind is racing and he is becoming more depressed and anxious. He currently denied having any suicidal ideations or plans. He continues to mix and match medications.   Patient was also asking that this medication should be sent to the [facility] so he can get the 90 day supply of the medications He is planning to go out to the restaurant on the Thanksgiving day as his wife is working at that time.   Elements:  Location:  depression and anxiety. Associated Signs/Symptoms: Depression Symptoms:  depressed mood, insomnia, psychomotor retardation, fatigue, feelings of worthlessness/guilt, difficulty concentrating, hopelessness, anxiety, loss of energy/fatigue, weight loss, decreased appetite, (Hypo) Manic Symptoms:  none Anxiety Symptoms:  Excessive Worry, Social Anxiety, Psychotic Symptoms:  none PTSD Symptoms: Negative NA  Past Medical History:  Past Medical History  Diagnosis Date  .  ADHD (attention deficit hyperactivity disorder)   . Anxiety   . Heart disease   . Diabetes mellitus, type II [facility])     Past Surgical History  Procedure Laterality Date   . Throat surgery    . Spine surgery     Family History:  Family History  Problem Relation Age of Onset  . Depression Mother   . Anxiety disorder Mother   . Diabetes Mother   . Heart failure Mother   . Heart attack Father   . Diabetes Sister   . Obesity Sister   . Drug abuse Brother   . Alcohol abuse Brother   . Depression Sister   . Anxiety disorder Sister   . Drug abuse Sister   . Anxiety disorder Sister   . Seizures Sister   . Diabetes Sister   . Depression Sister    Social History:   Social History   Social History  . Marital Status: Married    Spouse Name: N/A  . Number of Children: N/A  . Years of Education: N/A   Social History Main Topics  . Smoking status: Former Smoker    Types: Cigarettes    Start date: 08/29/1979    Quit date: 11/04/2010  . Smokeless tobacco: Former Systems developer    Quit date: 11/19/2010  . Alcohol Use: No  . Drug Use: No  . Sexual Activity: Not Currently   Other Topics Concern  . None   Social History Narrative   Additional Social History: Married x 28 years. Retied from [employer]. Wife is Freight forwarder at [place] [employer] [place]. Has a daughter, 55 yo. Pt is on  disability due to back pain and depression.   Musculoskeletal: Strength & Muscle Tone: decreased Gait & Station: normal Patient leans: N/A  Psychiatric Specialty Exam: HPI   ROS   Blood pressure 142/90, pulse 112, temperature 97.2 F (36.2 C), temperature source Tympanic, height 5' 11.5" (1.816 m), weight 238 lb (107.956 kg), SpO2 96 %.Body mass index is 32.74 kg/(m^2).  General Appearance: Casual  Eye Contact:  Fair  Speech:  Clear and Coherent  Volume:  Normal  Mood:  Anxious and Depressed  Affect:  Appropriate and Blunt  Thought Process:  Coherent and Goal Directed  Orientation:  Full (Time, Place, and Person)  Thought Content:  WDL  Suicidal Thoughts:  No  Homicidal Thoughts:  No  Memory:  Immediate;   Fair  Judgement:  Fair  Insight:  Fair  Psychomotor Activity:   Psychomotor Retardation  Concentration:  Fair  Recall:  [employer] of Knowledge:Fair  Language: Fair  Akathisia:  No  Handed:  Right  AIMS (if indicated):    Assets:  Communication Skills Desire for Improvement Social Support  ADL's:  Intact  Cognition: WNL  Sleep:  4-5   Is the patient at risk to self?  No. Has the patient been a risk to self in the past 6 months?  No. Has the patient been a risk to self within the distant past?  No. Is the patient a risk to others?  No. Has the patient been a risk to others in the past 6 months?  No. Has the patient been a risk to others within the distant past?  No.  Allergies:   Allergies  Allergen Reactions  . Benzoin Other (See Comments)    blisters  . Morphine Other (See Comments)  . Penicillins Other (See Comments)   Current Medications: Current Outpatient Prescriptions  Medication Sig Dispense Refill  .  ALPRAZolam (XANAX) 0.5 MG tablet Take 1 tablet (0.5 mg total) by mouth 3 (three) times daily. 90 tablet 0  . aspirin 81 MG tablet Take 1 tablet (81 mg total) by mouth daily. 30 tablet 0  . atorvastatin (LIPITOR) 10 MG tablet Take 1 tablet by mouth daily at 2 PM daily at 2 PM.    . baclofen (LIORESAL) 10 MG tablet Take 10 mg by mouth 4 (four) times daily.    Marland Kitchen buPROPion (WELLBUTRIN XL) 150 MG 24 hr tablet Take 1 tablet (150 mg total) by mouth daily. 30 tablet 1  . BUTRANS 10 MCG/HR PTWK patch Apply 1 patch topically once a week.    . cyanocobalamin 1000 MCG tablet Take 100 mcg by mouth daily.    Marland Kitchen desoximetasone (TOPICORT) 0.25 % cream Apply 1 application topically 2 (two) times daily as needed. 60 g 2  . dexmethylphenidate (FOCALIN XR) 10 MG 24 hr capsule Take 1 capsule (10 mg total) by mouth daily. 30 capsule 0  . Doxepin HCl (SILENOR) 6 MG TABS Take by mouth.    . estradiol cypionate (DEPO-ESTRADIOL) 5 MG/ML injection Inject 2.5 mg into the muscle once a week.    Marland Kitchen EVZIO 0.4 MG/0.4ML SOAJ INJECT 1 CARTRIDGE AS DIRECTED EVERY 10  MINUTES AS NEEDED( IF UNCONSCIOUS AND NOT BREATHING) FOR UP TO 2 DOSES  0  . gabapentin (NEURONTIN) 300 MG capsule Take 300 mg by mouth at bedtime.  2  . hydrochlorothiazide (HYDRODIURIL) 25 MG tablet Take 1 tablet by mouth daily at 2 PM daily at 2 PM.    . hydrOXYzine (VISTARIL) 25 MG capsule Take 1 capsule by mouth 2 (two) times daily.    Marland Kitchen latanoprost (XALATAN) 0.005 % ophthalmic solution Administer 1 drop to both eyes nightly.     . meloxicam (MOBIC) 7.5 MG tablet Take 7.5 mg by mouth 2 (two) times daily.    . metFORMIN (GLUCOPHAGE) 1000 MG tablet Take 1 tablet (1,000 mg total) by mouth 2 (two) times daily with a meal. 180 tablet 3  . Multiple Vitamin (MULTIVITAMIN) tablet Take 1 tablet by mouth daily.    . ondansetron (ZOFRAN) 4 MG tablet Take 4 mg by mouth every 8 (eight) hours as needed for nausea or vomiting.    . pantoprazole (PROTONIX) 40 MG tablet Take 1 tablet by mouth daily at 2 PM daily at 2 PM.    . PARoxetine (PAXIL) 40 MG tablet Take 1 tablet (40 mg total) by mouth every morning. 90 tablet 1  . traMADol (ULTRAM) 50 MG tablet Take 2 tablets by mouth 4 (four) times daily as needed.  2  . zolpidem (AMBIEN) 10 MG tablet Take 1 tablet (10 mg total) by mouth at bedtime. 30 tablet 0   No current facility-administered medications for this visit.    Previous Psychotropic Medications: Taking most of the medications since 2001-2002.  wellbutrin since 2008    Substance Abuse History in the last 12 months:  No.  Consequences of Substance Abuse: Negative NA  Medical Decision Making:  Review of Psycho-Social Stressors (1)  Treatment Plan Summary: Medication management   Discussed with patient about his medications. I will adjust his medications as follows.  Depression and anxiety Patient continues to have tremors in his hands  I will decrease the dose of Wellbutrin XL 150 mg in the morning. He will continue on Paxil 40 mg by mouth daily. He was given 90 day supply of the  medication   I will also decrease the  Xanax to 0.5 mg by mouth 3 times a day. Patient is currently experiencing tremors in his hands and is also having memory issues related to the same. He was given 90 day supply of the medication  Insomnia He will continue on Ambien 10 mg at bedtime He was given 90 day supply of the medications   stimulant medication Patient will continue on Focalin XR 20 mg in the morning. He reported that it is very expensive and he will try to find a coupon for the same.  Follow-up appointment 2 months    More than 50% of the time spent in psychoeducation, counseling and coordination of care.    This note was generated in part or whole with voice recognition software. Voice regonition is usually quite accurate but there are transcription errors that can and very often do occur. I apologize for any typographical errors that were not detected and corrected.     Rainey Pines 11/22/20163:19 PM

## 2015-09-26 NOTE — Telephone Encounter (Signed)
pt has appt today.  Pt bp was high and pt was advised to call pcp about bp.

## 2015-09-26 NOTE — Telephone Encounter (Signed)
two rx were faxed and confirmed .  ambien 10mg  id # [id number] order # [id number] #90 days. xanax .5mg  id # [id number] order # [id number] #270

## 2015-10-02 NOTE — Progress Notes (Signed)
All of these medication were reorder on  09-26-15 pt still taking medications.

## 2015-10-03 DIAGNOSIS — G250 Essential tremor: Secondary | ICD-10-CM | POA: Insufficient documentation

## 2015-10-17 ENCOUNTER — Other Ambulatory Visit: Payer: Self-pay

## 2015-10-18 MED ORDER — METFORMIN HCL 1000 MG PO TABS: 1000.0000 mg | ORAL_TABLET | Freq: Two times a day (BID) | ORAL | Status: DC

## 2015-11-23 ENCOUNTER — Telehealth: Payer: Self-pay

## 2015-11-23 ENCOUNTER — Ambulatory Visit (INDEPENDENT_AMBULATORY_CARE_PROVIDER_SITE_OTHER): Payer: 59 | Admitting: Psychiatry

## 2015-11-23 ENCOUNTER — Encounter: Payer: Self-pay | Admitting: Psychiatry

## 2015-11-23 VITALS — BP 122/90 | Temp 97.1°F | Ht 71.5 in | Wt 243.4 lb

## 2015-11-23 DIAGNOSIS — F331 Major depressive disorder, recurrent, moderate: Secondary | ICD-10-CM | POA: Diagnosis not present

## 2015-11-23 DIAGNOSIS — F413 Other mixed anxiety disorders: Secondary | ICD-10-CM | POA: Diagnosis not present

## 2015-11-23 MED ORDER — HYDROXYZINE PAMOATE 25 MG PO CAPS: 25.0000 mg | ORAL_CAPSULE | Freq: Two times a day (BID) | ORAL | Status: DC

## 2015-11-23 MED ORDER — PAROXETINE HCL 40 MG PO TABS: 40.0000 mg | ORAL_TABLET | ORAL | Status: DC

## 2015-11-23 MED ORDER — DEXMETHYLPHENIDATE HCL ER 10 MG PO CP24: 10.0000 mg | ORAL_CAPSULE | Freq: Every day | ORAL | Status: DC

## 2015-11-23 MED ORDER — BUPROPION HCL ER (XL) 150 MG PO TB24: 150.0000 mg | ORAL_TABLET | Freq: Every day | ORAL | Status: DC

## 2015-11-23 MED ORDER — ZOLPIDEM TARTRATE 10 MG PO TABS: 10.0000 mg | ORAL_TABLET | Freq: Every day | ORAL | Status: DC

## 2015-11-23 MED ORDER — ALPRAZOLAM 0.5 MG PO TABS: 0.5000 mg | ORAL_TABLET | Freq: Three times a day (TID) | ORAL | Status: DC

## 2015-11-23 NOTE — Progress Notes (Signed)
Psychiatric MD/ NP Note  Patient Identification: Dale [name] MRN:  [id number] Date of Evaluation:  11/23/2015 Referral Source: [place].  Chief Complaint:   Chief Complaint    Follow-up; Medication Refill     Visit Diagnosis:    ICD-9-CM ICD-10-CM   1. MDD (major depressive disorder), recurrent episode, moderate (HCC) 296.32 F33.1   2. Other mixed anxiety disorders 300.09 F41.3    Diagnosis:   Patient Active Problem List   Diagnosis Date Noted  . Benign essential tremor [G25.0] 10/03/2015  . Diabetes mellitus ([place]) [E11.9] 08/29/2015  . Personal history of other diseases of the circulatory system [Z86.79] 08/29/2015  . Glaucoma [H40.9] 06/27/2015  . B12 deficiency [E53.8] 06/27/2015  . 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 Billowing mitral valve [I34.1] 06/26/2015  . History of prolonged Q-T interval on ECG [Z86.79] 06/26/2015  . 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 Clinical depression [F32.9] 06/26/2015  . Low testosterone [E29.1] 06/26/2015  . Obesity (BMI 30.0-34.9) [E66.9] 06/26/2015  . CAD (coronary artery disease), native coronary artery [I25.10] 06/26/2015  . 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 Fatty infiltration of liver [K76.0] 10/21/2014  . Psoriasis [L40.9] 07/22/2014  . Acid reflux [K21.9] 07/22/2014  . Type 2 diabetes, controlled, with neuropathy ([place]) [E11.40] 06/22/2014  . HLD (hyperlipidemia) [E78.5] 06/22/2014  . Essential (primary) hypertension [I10] 06/22/2014  . Type 2 diabetes mellitus ([place]) [E11.9] 06/22/2014  . 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 Chronic pain associated with significant psychosocial dysfunction [G89.4] 04/29/2014  . History of surgical procedure [Z98.890] 04/29/2014  . Polypharmacy [Z79.899] 09/09/2013  . Other long term (current) drug therapy [Z79.899] 11/20/1998   History of Present Illness:    Patient is a 55 year old male male who  presented for the follow-up appointment. He appeared tired during the interview. Patient reported that he took the Seroquel only one time and was feeling very tired and has dryness in his mouth. He reported that he stopped taking the medication. Patient has increased the dose of Wellbutrin XL to 250 mg in the morning. He reported that he was taking up to 450 mg in the past. He does not want to decrease the dose. He is using his own pills. He reported that he continues to feel agitated at home with his wife. He tries to control .his agitation with Xanax 0.5 mg and takes up to 3 pills per day. He spends most of the time at home. He wants to get energy from the Focalin pills. He reported that he does not agree with the treatment plan and his self-medicating himself with the Focalin and Xanax. Patient stated that he feels tired throughout the day as he has been taking Xanax 3 times daily. He reported that he does not want to have his medications adjusted. He remains noncompliant with his treatment plan at this time. He currently denied having any suicidal ideations or plans. He continues to mix and match medications.   Patient was also asking that this medication should be sent to the [facility] so he can get the 90 day supply of the medications   Elements:  Location:  depression and anxiety. Associated Signs/Symptoms: Depression Symptoms:  depressed mood, insomnia, psychomotor retardation, fatigue, feelings of worthlessness/guilt, difficulty concentrating, hopelessness, anxiety, loss of energy/fatigue, (Hypo) Manic Symptoms:  none Anxiety Symptoms:  Excessive Worry, Social Anxiety, Psychotic Symptoms:  none PTSD Symptoms: Negative NA  Past Medical History:  Past Medical History  Diagnosis Date  .  ADHD (attention deficit hyperactivity disorder)   . Anxiety   . Heart disease   . Diabetes mellitus, type II [facility])     Past Surgical History  Procedure Laterality Date  . Throat surgery    .  Spine surgery     Family History:  Family History  Problem Relation Age of Onset  . Depression Mother   . Anxiety disorder Mother   . Diabetes Mother   . Heart failure Mother   . Heart attack Father   . Diabetes Sister   . Obesity Sister   . Drug abuse Brother   . Alcohol abuse Brother   . Depression Sister   . Anxiety disorder Sister   . Drug abuse Sister   . Anxiety disorder Sister   . Seizures Sister   . Diabetes Sister   . Depression Sister    Social History:   Social History   Social History  . Marital Status: Married    Spouse Name: N/A  . Number of Children: N/A  . Years of Education: N/A   Social History Main Topics  . Smoking status: Former Smoker    Types: Cigarettes    Start date: 08/29/1979    Quit date: 11/04/2010  . Smokeless tobacco: Former Systems developer    Quit date: 11/19/2010  . Alcohol Use: No  . Drug Use: No  . Sexual Activity: Not Currently   Other Topics Concern  . None   Social History Narrative   Additional Social History: Married x 28 years. Retied from [employer]. Wife is Freight forwarder at [place]. Has a daughter, 31 yo. Pt is on  disability due to back pain and depression.   Musculoskeletal: Strength & Muscle Tone: decreased Gait & Station: normal Patient leans: N/A  Psychiatric Specialty Exam: HPI   ROS   Blood pressure 122/90, temperature 97.1 F (36.2 C), temperature source Tympanic, height 5' 11.5" (1.816 m), weight 243 lb 6.4 oz (110.406 kg).Body mass index is 33.48 kg/(m^2).  General Appearance: Casual  Eye Contact:  Fair  Speech:  Clear and Coherent  Volume:  Normal  Mood:  Anxious and Depressed  Affect:  Appropriate and Blunt  Thought Process:  Coherent and Goal Directed  Orientation:  Full (Time, Place, and Person)  Thought Content:  WDL  Suicidal Thoughts:  No  Homicidal Thoughts:  No  Memory:  Immediate;   Fair  Judgement:  Fair  Insight:  Fair  Psychomotor Activity:  Psychomotor Retardation  Concentration:  Fair   Recall:  [employer] of Knowledge:Fair  Language: Fair  Akathisia:  No  Handed:  Right  AIMS (if indicated):    Assets:  Communication Skills Desire for Improvement Social Support  ADL's:  Intact  Cognition: WNL  Sleep:  4-5   Is the patient at risk to self?  No. Has the patient been a risk to self in the past 6 months?  No. Has the patient been a risk to self within the distant past?  No. Is the patient a risk to others?  No. Has the patient been a risk to others in the past 6 months?  No. Has the patient been a risk to others within the distant past?  No.  Allergies:   Allergies  Allergen Reactions  . Benzoin Other (See Comments)    blisters  . Morphine Other (See Comments)  . Penicillins Other (See Comments)   Current Medications: Current Outpatient Prescriptions  Medication Sig Dispense Refill  . ALPRAZolam (XANAX) 0.5  MG tablet Take 1 tablet (0.5 mg total) by mouth 3 (three) times daily. 270 tablet 1  . amLODipine (NORVASC) 5 MG tablet Take by mouth.    Marland Kitchen aspirin 81 MG tablet Take 1 tablet (81 mg total) by mouth daily. 30 tablet 0  . atorvastatin (LIPITOR) 10 MG tablet Take 1 tablet by mouth daily at 2 PM daily at 2 PM.    . baclofen (LIORESAL) 10 MG tablet Take 10 mg by mouth 4 (four) times daily.    Marland Kitchen buPROPion (WELLBUTRIN XL) 150 MG 24 hr tablet Take 1 tablet (150 mg total) by mouth daily. 90 tablet 1  . BUTRANS 10 MCG/HR PTWK patch Apply 1 patch topically once a week.    . cyanocobalamin 1000 MCG tablet Take 100 mcg by mouth daily.    Marland Kitchen desoximetasone (TOPICORT) 0.25 % cream Apply 1 application topically 2 (two) times daily as needed. 60 g 2  . dexmethylphenidate (FOCALIN XR) 10 MG 24 hr capsule Take 1 capsule (10 mg total) by mouth daily. 30 capsule 0  . Doxepin HCl (SILENOR) 6 MG TABS Take by mouth.    . estradiol cypionate (DEPO-ESTRADIOL) 5 MG/ML injection Inject 2.5 mg into the muscle once a week.    Marland Kitchen EVZIO 0.4 MG/0.4ML SOAJ INJECT 1 CARTRIDGE AS DIRECTED  EVERY 10 MINUTES AS NEEDED( IF UNCONSCIOUS AND NOT BREATHING) FOR UP TO 2 DOSES  0  . gabapentin (NEURONTIN) 300 MG capsule Take 300 mg by mouth at bedtime.  2  . hydrochlorothiazide (HYDRODIURIL) 25 MG tablet Take 1 tablet by mouth daily at 2 PM daily at 2 PM.    . hydrOXYzine (VISTARIL) 25 MG capsule Take 1 capsule (25 mg total) by mouth 2 (two) times daily. 180 capsule 1  . latanoprost (XALATAN) 0.005 % ophthalmic solution Administer 1 drop to both eyes nightly.     . meloxicam (MOBIC) 7.5 MG tablet Take 7.5 mg by mouth 2 (two) times daily.    . metFORMIN (GLUCOPHAGE) 1000 MG tablet Take 1 tablet (1,000 mg total) by mouth 2 (two) times daily with a meal. 180 tablet 3  . Multiple Vitamin (MULTIVITAMIN) tablet Take 1 tablet by mouth daily.    . ondansetron (ZOFRAN) 4 MG tablet Take 4 mg by mouth every 8 (eight) hours as needed for nausea or vomiting.    . pantoprazole (PROTONIX) 40 MG tablet Take 1 tablet by mouth daily at 2 PM daily at 2 PM.    . PARoxetine (PAXIL) 40 MG tablet Take 1 tablet (40 mg total) by mouth every morning. 90 tablet 1  . QUEtiapine (SEROQUEL) 100 MG tablet Take 1 tablet (100 mg total) by mouth at bedtime. 30 tablet 1  . traMADol (ULTRAM) 50 MG tablet Take 2 tablets by mouth 4 (four) times daily as needed.  2  . zolpidem (AMBIEN) 10 MG tablet Take 1 tablet (10 mg total) by mouth at bedtime. 30 tablet 1   No current facility-administered medications for this visit.    Previous Psychotropic Medications: Taking most of the medications since 2001-2002.  wellbutrin since 2008    Substance Abuse History in the last 12 months:  No.  Consequences of Substance Abuse: Negative NA  Medical Decision Making:  Review of Psycho-Social Stressors (1)  Treatment Plan Summary: Medication management   Discussed with patient about his medications. I will adjust his medications as follows.    I will continue with Wellbutrin XL 150 mg in the morning. He will continue on Paxil  40 mg by mouth daily. He was given 90 day supply of the medication   Continue  Xanax to 0.5 mg by mouth 3 times a day. He was given 90 day supply of the medication  Insomnia He will continue on Ambien 10 mg at bedtime He was given 90 day supply of the medications   stimulant medication Patient will continue on Focalin XR 20 mg in the morning. 2 months supply given.   Patient is not willing to have his medications adjusted at this time   Follow-up appointment 3 months    More than 50% of the time spent in psychoeducation, counseling and coordination of care.    This note was generated in part or whole with voice recognition software. Voice regonition is usually quite accurate but there are transcription errors that can and very often do occur. I apologize for any typographical errors that were not detected and corrected.    Rainey Pines, MD    1/19/201711:09 AM

## 2015-11-23 NOTE — Telephone Encounter (Signed)
rx faxed and confirmed- id # [id number] order# [id number]

## 2015-12-22 ENCOUNTER — Other Ambulatory Visit: Payer: Self-pay | Admitting: Family Medicine

## 2015-12-22 MED ORDER — METFORMIN HCL 1000 MG PO TABS: 1000.0000 mg | ORAL_TABLET | Freq: Two times a day (BID) | ORAL | Status: DC

## 2016-01-17 ENCOUNTER — Ambulatory Visit: Payer: Commercial Managed Care - HMO | Admitting: Family Medicine

## 2016-01-17 ENCOUNTER — Other Ambulatory Visit: Payer: Self-pay

## 2016-01-17 NOTE — Telephone Encounter (Signed)
Patient states that he only has 4 days left on his Rx. Would like additional refills sent to pharmacy. Patient has an appointment on Monday to see Dr. Vicente Masson

## 2016-01-19 MED ORDER — METFORMIN HCL 1000 MG PO TABS: 1000.0000 mg | ORAL_TABLET | Freq: Two times a day (BID) | ORAL | Status: DC

## 2016-01-22 ENCOUNTER — Encounter: Payer: Self-pay | Admitting: Family Medicine

## 2016-01-22 ENCOUNTER — Ambulatory Visit (INDEPENDENT_AMBULATORY_CARE_PROVIDER_SITE_OTHER): Payer: Self-pay | Admitting: Family Medicine

## 2016-01-22 VITALS — BP 120/72 | HR 82 | Resp 16 | Ht 72.0 in | Wt 247.0 lb

## 2016-01-22 DIAGNOSIS — E1140 Type 2 diabetes mellitus with diabetic neuropathy, unspecified: Secondary | ICD-10-CM

## 2016-01-22 DIAGNOSIS — L309 Dermatitis, unspecified: Secondary | ICD-10-CM

## 2016-01-22 DIAGNOSIS — E66811 Obesity, class 1: Secondary | ICD-10-CM

## 2016-01-22 DIAGNOSIS — I2510 Atherosclerotic heart disease of native coronary artery without angina pectoris: Secondary | ICD-10-CM

## 2016-01-22 DIAGNOSIS — E538 Deficiency of other specified B group vitamins: Secondary | ICD-10-CM

## 2016-01-22 DIAGNOSIS — I10 Essential (primary) hypertension: Secondary | ICD-10-CM

## 2016-01-22 DIAGNOSIS — E785 Hyperlipidemia, unspecified: Secondary | ICD-10-CM

## 2016-01-22 DIAGNOSIS — E669 Obesity, unspecified: Secondary | ICD-10-CM

## 2016-01-22 MED ORDER — HYDROCHLOROTHIAZIDE 25 MG PO TABS: 25.0000 mg | ORAL_TABLET | Freq: Every day | ORAL | Status: DC

## 2016-01-22 MED ORDER — ONDANSETRON HCL 4 MG PO TABS: 4.0000 mg | ORAL_TABLET | Freq: Three times a day (TID) | ORAL | Status: DC | PRN

## 2016-01-22 NOTE — Patient Instructions (Signed)
Use Cetaphil twice daily especially after a bath or shower.

## 2016-01-23 LAB — VITAMIN D 25 HYDROXY (VIT D DEFICIENCY, FRACTURES): Vit D, 25-Hydroxy: 33.4 ng/mL (ref 30.0–100.0)

## 2016-01-23 LAB — HEMOGLOBIN A1C
Est. average glucose Bld gHb Est-mCnc: 166 mg/dL
HEMOGLOBIN A1C: 7.4 % — AB (ref 4.8–5.6)

## 2016-01-23 LAB — VITAMIN B12: Vitamin B-12: 457 pg/mL (ref 211–946)

## 2016-01-23 NOTE — Progress Notes (Signed)
Date:  01/22/2016   Name:  Dale [name]   DOB:  Mar 15, 1961   MRN:  [id number]  PCP:  Adline Potter, MD    Chief Complaint: Diabetes and Rash   History of Present Illness:  This is a 55 y.o. male for 6 month f/u. Seeing [facility] for depression and [facility] pain medicine. Recently saw cards who started amlodipine and neurology who changed gabapentin to Lyrica which has helped. Last a1c 6.3% in August. On asa and Lipitor. C/o dry skin over arms and legs, not using lubricating cream. Review of Systems:  Review of Systems  Constitutional: Negative for fever.  Respiratory: Negative for cough and shortness of breath.   Cardiovascular: Negative for chest pain and leg swelling.  Endocrine: Negative for polyuria.  Genitourinary: Negative for difficulty urinating.  Neurological: Negative for syncope and light-headedness.    Patient Active Problem List   Diagnosis Date Noted  . Eczema 01/22/2016  . Benign essential tremor 10/03/2015  . Personal history of other diseases of the circulatory system 08/29/2015  . Glaucoma 06/27/2015  . B12 deficiency 06/27/2015  . Cataplexy and narcolepsy 06/26/2015  . Billowing mitral valve 06/26/2015  . History of prolonged Q-T interval on ECG 06/26/2015  . Herniation of nucleus pulposus 06/26/2015  . Clinical depression 06/26/2015  . Low testosterone 06/26/2015  . Obesity (BMI 30.0-34.9) 06/26/2015  . CAD (coronary artery disease), native coronary artery 06/26/2015  . 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 Fatty infiltration of liver 10/21/2014  . Psoriasis 07/22/2014  . Acid reflux 07/22/2014  . Type 2 diabetes, controlled, with neuropathy ([place]) 06/22/2014  . HLD (hyperlipidemia) 06/22/2014  . Essential (primary) hypertension 06/22/2014  . Failed back syndrome of lumbar spine 04/29/2014  . Chronic pain associated with significant psychosocial dysfunction 04/29/2014  . History of surgical procedure 04/29/2014  . Polypharmacy 09/09/2013  . Other long  term (current) drug therapy 11/20/1998    Prior to Admission medications   Medication Sig Start Date End Date Taking? Authorizing Provider  ALPRAZolam Duanne Moron) 0.5 MG tablet Take 1 tablet (0.5 mg total) by mouth 3 (three) times daily. 11/23/15  Yes Rainey Pines, MD  amLODipine (NORVASC) 5 MG tablet Take 1 tablet by mouth daily. 09/27/15 09/26/16 Yes Historical Provider, MD  aspirin 81 MG tablet Take 1 tablet (81 mg total) by mouth daily. 07/21/15  Yes Adline Potter, MD  atorvastatin (LIPITOR) 10 MG tablet Take 1 tablet by mouth daily at 2 PM daily at 2 PM. 06/20/15  Yes Historical Provider, MD  baclofen (LIORESAL) 10 MG tablet Take 10 mg by mouth 4 (four) times daily.   Yes Historical Provider, MD  buPROPion (WELLBUTRIN XL) 150 MG 24 hr tablet Take 1 tablet (150 mg total) by mouth daily. 11/23/15  Yes Rainey Pines, MD  BUTRANS 10 MCG/HR PTWK patch Apply 1 patch topically once a week. 06/05/15  Yes Historical Provider, MD  cyanocobalamin 1000 MCG tablet Take 100 mcg by mouth daily.   Yes Historical Provider, MD  desoximetasone (TOPICORT) 0.25 % cream Apply 1 application topically 2 (two) times daily as needed. 07/19/15  Yes Adline Potter, MD  estradiol cypionate (DEPO-ESTRADIOL) 5 MG/ML injection Inject 2.5 mg into the muscle once a week.   Yes Historical Provider, MD  EVZIO 0.4 MG/0.4ML SOAJ INJECT 1 CARTRIDGE AS DIRECTED EVERY 10 MINUTES AS NEEDED( IF UNCONSCIOUS AND NOT BREATHING) FOR UP TO 2 DOSES 06/19/15  Yes Historical Provider, MD  hydrochlorothiazide (HYDRODIURIL) 25 MG tablet Take 1 tablet (25 mg total) by  mouth daily. 01/22/16  Yes Adline Potter, MD  hydrOXYzine (VISTARIL) 25 MG capsule Take 1 capsule (25 mg total) by mouth 2 (two) times daily. 11/23/15  Yes Rainey Pines, MD  latanoprost (XALATAN) 0.005 % ophthalmic solution Place 1 drop into both eyes at bedtime. 09/01/14  Yes Historical Provider, MD  LYRICA 150 MG capsule Take 1 capsule by mouth 2 (two) times daily. 01/08/16  Yes Historical Provider,  MD  meloxicam (MOBIC) 7.5 MG tablet Take 7.5 mg by mouth 2 (two) times daily.   Yes Historical Provider, MD  metFORMIN (GLUCOPHAGE) 1000 MG tablet Take 1 tablet (1,000 mg total) by mouth 2 (two) times daily with a meal. 01/19/16  Yes Adline Potter, MD  Multiple Vitamin (MULTIVITAMIN) tablet Take 1 tablet by mouth daily.   Yes Historical Provider, MD  ondansetron (ZOFRAN) 4 MG tablet Take 1 tablet (4 mg total) by mouth every 8 (eight) hours as needed for nausea or vomiting. 01/22/16  Yes Adline Potter, MD  PARoxetine (PAXIL) 40 MG tablet Take 1 tablet (40 mg total) by mouth every morning. 11/23/15  Yes Rainey Pines, MD  traMADol (ULTRAM) 50 MG tablet Take 2 tablets by mouth 4 (four) times daily as needed. 05/16/15  Yes Historical Provider, MD  zolpidem (AMBIEN) 10 MG tablet Take 1 tablet (10 mg total) by mouth at bedtime. 11/23/15  Yes Rainey Pines, MD    Allergies  Allergen Reactions  . Benzoin Other (See Comments)    blisters  . Morphine Other (See Comments)  . Penicillins Other (See Comments)    Past Surgical History  Procedure Laterality Date  . Throat surgery    . Spine surgery      Social History  Substance Use Topics  . Smoking status: Former Smoker    Types: Cigarettes    Start date: 08/29/1979    Quit date: 11/04/2010  . Smokeless tobacco: Former Systems developer    Quit date: 11/19/2010  . Alcohol Use: No    Family History  Problem Relation Age of Onset  . Depression Mother   . Anxiety disorder Mother   . Diabetes Mother   . Heart failure Mother   . Heart attack Father   . Diabetes Sister   . Obesity Sister   . Drug abuse Brother   . Alcohol abuse Brother   . Depression Sister   . Anxiety disorder Sister   . Drug abuse Sister   . Anxiety disorder Sister   . Seizures Sister   . Diabetes Sister   . Depression Sister     Medication list has been reviewed and updated.  Physical Examination: BP 120/72 mmHg  Pulse 82  Resp 16  Ht 6' (1.829 m)  Wt 247 lb (112.038 kg)   BMI 33.49 kg/m2  SpO2 93%  Physical Exam  Constitutional: He appears well-developed and well-nourished.  Cardiovascular: Normal rate, regular rhythm and normal heart sounds.   Pulmonary/Chest: Effort normal and breath sounds normal.  Musculoskeletal: He exhibits no edema.  Neurological: He is alert.  Skin: Skin is warm and dry.  Eczematous changes BUE  Psychiatric: He has a normal mood and affect. His behavior is normal.  Nursing note and vitals reviewed.   Assessment and Plan:  1. Essential (primary) hypertension Well controlled on amlodipine and HCTZ, refill HCTZ  2. Coronary artery disease involving native coronary artery of native heart without angina pectoris Stable, followed by cardiology  3. Type 2 diabetes, controlled, with neuropathy ([place]) Unclear control on metformin, Lyrica helping neuropathy -  HgB A1c  4. B12 deficiency On supplement - B12  5. HLD (hyperlipidemia) Well controlled in August on Lipitor  6. Obesity (BMI 30.0-34.9) Weight up 15#, exercise/weight loss discussed - Vitamin D (25 hydroxy)  7. Eczema Cetaphil bid esp after bath/shower  Return in about 6 months (around 07/24/2016).  Satira Anis. [facility]  01/23/2016

## 2016-01-30 ENCOUNTER — Other Ambulatory Visit: Payer: Self-pay | Admitting: Family Medicine

## 2016-01-30 MED ORDER — DESONIDE 0.05 % EX CREA: TOPICAL_CREAM | Freq: Two times a day (BID) | CUTANEOUS | Status: DC | PRN

## 2016-01-31 ENCOUNTER — Ambulatory Visit (INDEPENDENT_AMBULATORY_CARE_PROVIDER_SITE_OTHER): Payer: Commercial Managed Care - HMO | Admitting: Urology

## 2016-01-31 ENCOUNTER — Encounter: Payer: Self-pay | Admitting: Urology

## 2016-01-31 VITALS — BP 126/83 | HR 84 | Ht 72.0 in | Wt 248.2 lb

## 2016-01-31 DIAGNOSIS — K5903 Drug induced constipation: Secondary | ICD-10-CM | POA: Diagnosis not present

## 2016-01-31 DIAGNOSIS — T402X5A Adverse effect of other opioids, initial encounter: Secondary | ICD-10-CM | POA: Diagnosis not present

## 2016-01-31 DIAGNOSIS — R251 Tremor, unspecified: Secondary | ICD-10-CM | POA: Diagnosis not present

## 2016-01-31 DIAGNOSIS — R3912 Poor urinary stream: Secondary | ICD-10-CM

## 2016-01-31 DIAGNOSIS — N400 Enlarged prostate without lower urinary tract symptoms: Secondary | ICD-10-CM | POA: Diagnosis not present

## 2016-01-31 LAB — URINALYSIS, COMPLETE
BILIRUBIN UA: NEGATIVE
Glucose, UA: NEGATIVE
Ketones, UA: NEGATIVE
NITRITE UA: NEGATIVE
PH UA: 6.5 (ref 5.0–7.5)
Protein, UA: NEGATIVE
RBC UA: NEGATIVE
Specific Gravity, UA: 1.02 (ref 1.005–1.030)
UUROB: 0.2 mg/dL (ref 0.2–1.0)

## 2016-01-31 LAB — MICROSCOPIC EXAMINATION
Bacteria, UA: NONE SEEN
Epithelial Cells (non renal): NONE SEEN /hpf (ref 0–10)
RBC, UA: NONE SEEN /hpf (ref 0–?)

## 2016-01-31 LAB — BLADDER SCAN AMB NON-IMAGING: SCAN RESULT: 31

## 2016-01-31 MED ORDER — TAMSULOSIN HCL 0.4 MG PO CAPS: 0.4000 mg | ORAL_CAPSULE | Freq: Every day | ORAL | Status: DC

## 2016-01-31 NOTE — Progress Notes (Signed)
01/31/2016 3:41 PM   Dale [name] 10/26/1961 [id number]  Referring provider: Adline Potter, MD [address] [place], [place] [address]  Chief Complaint  Patient presents with  . New Patient (Initial Visit)    incomplete bladder empting    HPI: The patient is a 55 year old gentleman with past medical history significant for chronic pain, back pain, Essential tremor and multiple other medical comorbidities on multiple medications who presents today for discussion of decreased urinary flow. The patient states that this all started approximate 6 months ago. His I PSS score today is 26/6. His biggest complaint is his inability to start his stream. He left the strain wait to get his urine flowing properly. He also feels like he is unable to empty his bladder. He also notes nocturia 4. Also significant complaints of frequency, intermittency, weak stream, and straining. Of note, the patient is on multiple medications including Dilaudid pump and baclofen for his chronic pain. Also over the last year he has developed the constant tremor that has been getting significantly worse. His neurosurgeon is unclear the etiology of his tremor. Patient also notes that he is constipated from his pain medications. He takes 4 stools A day but does not feel this helps.  PVR: 31 cc  PMH: Past Medical History  Diagnosis Date  . ADHD (attention deficit hyperactivity disorder)   . Anxiety   . Heart disease   . Diabetes mellitus, type II [facility])     Surgical History: Past Surgical History  Procedure Laterality Date  . Throat surgery    . Spine surgery      Home Medications:    Medication List       This list is accurate as of: 01/31/16  3:41 PM.  Always use your most recent med list.               ALPRAZolam 0.5 MG tablet  Commonly known as:  XANAX  Take 1 tablet (0.5 mg total) by mouth 3 (three) times daily.     amLODipine 5 MG tablet  Commonly known as:  NORVASC  Take 1 tablet  by mouth daily.     aspirin 81 MG tablet  Take 1 tablet (81 mg total) by mouth daily.     atorvastatin 10 MG tablet  Commonly known as:  LIPITOR  Take 1 tablet by mouth daily at 2 PM daily at 2 PM.     baclofen 10 MG tablet  Commonly known as:  LIORESAL  Take 10 mg by mouth 4 (four) times daily.     buPROPion 150 MG 24 hr tablet  Commonly known as:  WELLBUTRIN XL  Take 1 tablet (150 mg total) by mouth daily.     BUTRANS 10 MCG/HR Ptwk patch  Generic drug:  buprenorphine  Apply 1 patch topically once a week.     cyanocobalamin 1000 MCG tablet  Take 100 mcg by mouth daily.     desonide 0.05 % cream  Commonly known as:  DESOWEN  Apply topically 2 (two) times daily as needed.     desoximetasone 0.25 % cream  Commonly known as:  TOPICORT  Apply 1 application topically 2 (two) times daily as needed.     estradiol cypionate 5 MG/ML injection  Commonly known as:  DEPO-ESTRADIOL  Inject 2.5 mg into the muscle once a week. Reported on 01/31/2016     EVZIO 0.4 MG/0.4ML Soaj  Generic drug:  Naloxone HCl  INJECT 1 CARTRIDGE AS DIRECTED EVERY 10  MINUTES AS NEEDED( IF UNCONSCIOUS AND NOT BREATHING) FOR UP TO 2 DOSES     hydrochlorothiazide 25 MG tablet  Commonly known as:  HYDRODIURIL  Take 1 tablet (25 mg total) by mouth daily.     hydrOXYzine 25 MG capsule  Commonly known as:  VISTARIL  Take 1 capsule (25 mg total) by mouth 2 (two) times daily.     latanoprost 0.005 % ophthalmic solution  Commonly known as:  XALATAN  Place 1 drop into both eyes at bedtime.     LYRICA 150 MG capsule  Generic drug:  pregabalin  Take 1 capsule by mouth 2 (two) times daily.     meloxicam 7.5 MG tablet  Commonly known as:  MOBIC  Take 7.5 mg by mouth 2 (two) times daily.     metFORMIN 1000 MG tablet  Commonly known as:  GLUCOPHAGE  Take 1 tablet (1,000 mg total) by mouth 2 (two) times daily with a meal.     multivitamin tablet  Take 1 tablet by mouth daily.     ondansetron 4 MG tablet    Commonly known as:  ZOFRAN  Take 1 tablet (4 mg total) by mouth every 8 (eight) hours as needed for nausea or vomiting.     PARoxetine 40 MG tablet  Commonly known as:  PAXIL  Take 1 tablet (40 mg total) by mouth every morning.     tamsulosin 0.4 MG Caps capsule  Commonly known as:  FLOMAX  Take 1 capsule (0.4 mg total) by mouth daily.     traMADol 50 MG tablet  Commonly known as:  ULTRAM  Take 2 tablets by mouth 4 (four) times daily as needed.     zolpidem 10 MG tablet  Commonly known as:  AMBIEN  Take 1 tablet (10 mg total) by mouth at bedtime.        Allergies:  Allergies  Allergen Reactions  . Benzoin Other (See Comments)    blisters  . Morphine Other (See Comments)  . Penicillins Other (See Comments)    Family History: Family History  Problem Relation Age of Onset  . Depression Mother   . Anxiety disorder Mother   . Diabetes Mother   . Heart failure Mother   . Heart attack Father   . Diabetes Sister   . Obesity Sister   . Drug abuse Brother   . Alcohol abuse Brother   . Depression Sister   . Anxiety disorder Sister   . Drug abuse Sister   . Anxiety disorder Sister   . Seizures Sister   . Diabetes Sister   . Depression Sister     Social History:  reports that he quit smoking about 5 years ago. His smoking use included Cigarettes. He started smoking about 36 years ago. He quit smokeless tobacco use about 5 years ago. He reports that he does not drink alcohol or use illicit drugs.  ROS: UROLOGY Frequent Urination?: Yes Hard to postpone urination?: No Burning/pain with urination?: No Get up at night to urinate?: Yes Leakage of urine?: Yes Urine stream starts and stops?: Yes Trouble starting stream?: Yes Do you have to strain to urinate?: Yes Blood in urine?: No Urinary tract infection?: No Sexually transmitted disease?: No Injury to kidneys or bladder?: No Painful intercourse?: No Weak stream?: Yes Erection problems?: No Penile pain?:  No  Gastrointestinal Nausea?: No Vomiting?: No Indigestion/heartburn?: No Diarrhea?: No Constipation?: Yes  Constitutional Fever: No Night sweats?: No Weight loss?: No Fatigue?: No  Skin Skin  rash/lesions?: No Itching?: No  Eyes Blurred vision?: No Double vision?: No  Ears/Nose/Throat Sore throat?: No Sinus problems?: No  Hematologic/Lymphatic Swollen glands?: No Easy bruising?: No  Cardiovascular Leg swelling?: Yes Chest pain?: No  Respiratory Cough?: No Shortness of breath?: No  Endocrine Excessive thirst?: No  Musculoskeletal Back pain?: Yes Joint pain?: Yes  Neurological Headaches?: No Dizziness?: Yes  Psychologic Depression?: Yes Anxiety?: Yes  Physical Exam: BP 126/83 mmHg  Pulse 84  Ht 6' (1.829 m)  Wt 248 lb 3.2 oz (112.583 kg)  BMI 33.65 kg/m2  Constitutional:  Alert and oriented, No acute distress. HEENT: [place] AT, moist mucus membranes.  Trachea midline, no masses. Cardiovascular: No clubbing, cyanosis, or edema. Respiratory: Normal respiratory effort, no increased work of breathing. GI: Abdomen is soft, nontender, nondistended, no abdominal masses GU: No CVA tenderness. Normal phallus. Testicles descended equally bilaterally. No masses. DRE: 1+ smooth no nodules. The somewhat difficult to palpate. Skin: No rashes, bruises or suspicious lesions. Lymph: No cervical or inguinal adenopathy. Neurologic: Grossly intact, no focal deficits, moving all 4 extremities. Psychiatric: Normal mood and affect.  Laboratory Data: Lab Results  Component Value Date   WBC 6.5 06/26/2015   HGB 14.3 08/20/2013   HCT 40.8 06/26/2015   MCV 87 06/26/2015   PLT 305 06/26/2015    Lab Results  Component Value Date   CREATININE 0.74* 06/26/2015    No results found for: PSA  Lab Results  Component Value Date   TESTOSTERONE 932 06/26/2015    Lab Results  Component Value Date   HGBA1C 7.4* 01/22/2016    Urinalysis    Component Value  Date/Time   COLORURINE Yellow 08/20/2013 1920   APPEARANCEUR Clear 08/20/2013 1920   LABSPEC 1.015 08/20/2013 1920   PHURINE 5.0 08/20/2013 1920   GLUCOSEU Negative 08/20/2013 1920   HGBUR Negative 08/20/2013 1920   BILIRUBINUR Negative 08/20/2013 1920   KETONESUR Trace 08/20/2013 1920   PROTEINUR Negative 08/20/2013 1920   NITRITE Negative 08/20/2013 1920   LEUKOCYTESUR 1+ 08/20/2013 1920    Assessment & Plan:    I discussed with the patient that there could be multiple etiologies for his significant urinary symptoms. These include BPH, opioid use, and constipation. It also may be related to the currently on and identified source for his continuous tremor. We discussed the first option would be to treat him for BPH with Flomax. He will try this medication for 3 months to see if it helps his urinary symptoms. Due to the patient's risk for a multifactorial etiology, if he does not respond to treatment with Flomax we will arrange for him to undergo urodynamic studies.  1. BPH 2. Constipation 3. Chronic opioid use 4. Continuous tremor -Flomax 0.4 mg daily -continue current regimen for constipaiton -follow up in 3 months  Nickie Retort, [place] [address], [place],  [address] [phone]

## 2016-01-31 NOTE — Progress Notes (Signed)
Bladder Scan Patient  void:33ml Performed By: Larna Daughters

## 2016-02-20 ENCOUNTER — Ambulatory Visit (INDEPENDENT_AMBULATORY_CARE_PROVIDER_SITE_OTHER): Payer: 59 | Admitting: Psychiatry

## 2016-02-20 ENCOUNTER — Encounter: Payer: Self-pay | Admitting: Psychiatry

## 2016-02-20 VITALS — BP 128/78 | HR 110 | Temp 98.6°F | Ht 72.0 in | Wt 251.6 lb

## 2016-02-20 DIAGNOSIS — F331 Major depressive disorder, recurrent, moderate: Secondary | ICD-10-CM

## 2016-02-20 DIAGNOSIS — F413 Other mixed anxiety disorders: Secondary | ICD-10-CM

## 2016-02-20 MED ORDER — TEMAZEPAM 15 MG PO CAPS: 15.0000 mg | ORAL_CAPSULE | Freq: Every evening | ORAL | Status: DC | PRN

## 2016-02-20 NOTE — Progress Notes (Signed)
Psychiatric MD/ NP Progress Note  Patient Identification: Dale [name] MRN:  [id number] Date of Evaluation:  02/20/2016 Referral Source: [place].  Chief Complaint:   Chief Complaint    Follow-up; Medication Refill; Other     Visit Diagnosis:    ICD-9-CM ICD-10-CM   1. MDD (major depressive disorder), recurrent episode, moderate (HCC) 296.32 F33.1   2. Other mixed anxiety disorders 300.09 F41.3    Diagnosis:   Patient Active Problem List   Diagnosis Date Noted  . Eczema [L30.9] 01/22/2016  . Benign essential tremor [G25.0] 10/03/2015  . Glaucoma [H40.9] 06/27/2015  . B12 deficiency [E53.8] 06/27/2015  . 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 Billowing mitral valve [I34.1] 06/26/2015  . History of prolonged Q-T interval on ECG [Z86.79] 06/26/2015  . 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 Clinical depression [F32.9] 06/26/2015  . Low testosterone [E29.1] 06/26/2015  . Obesity (BMI 30.0-34.9) [E66.9] 06/26/2015  . CAD (coronary artery disease), native coronary artery [I25.10] 06/26/2015  . 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 Fatty infiltration of liver [K76.0] 10/21/2014  . Psoriasis [L40.9] 07/22/2014  . Acid reflux [K21.9] 07/22/2014  . Type 2 diabetes, controlled, with neuropathy ([place]) [E11.40] 06/22/2014  . HLD (hyperlipidemia) [E78.5] 06/22/2014  . Essential (primary) hypertension [I10] 06/22/2014  . 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 Chronic pain associated with significant psychosocial dysfunction [G89.4] 04/29/2014  . History of surgical procedure [Z98.890] 04/29/2014  . Polypharmacy [Z79.899] 09/09/2013  . Other long term (current) drug therapy [Z79.899] 11/20/1998   History of Present Illness:    Patient is a 55 year old  male who presented for the follow-up appointment. He appeared tired during the interview. Patient reported that He is not sleeping well at night. He reported  that he is awake until 5:00 in the morning as Ambien is not working. He remained focus on his medications as usual. He reported that he has stopped taking the Focalin as it was very expensive for him. He remained focus on the sleeping aids. He reported that he takes Xanax 3 times daily. He reported that his medications are not adjusted. He reported that Dr. Toy Care  was giving him some different type of stimulant  medication. I looked up his [place] control drug registry and Dr. Toy Care  was only prescribing him Focalin XR in his previous years. Patient stated that he was getting a different type of medication and he will go to the [facility] to find the name of the different medication. He currently denied having any use of drugs. However he reported that he feels very anxious but does not appear anxious during the interview. He denied having any suicidal ideations or plans. We discussed about his medications in detail.   Elements:  Location:  depression and anxiety. Associated Signs/Symptoms: Depression Symptoms:  depressed mood, insomnia, psychomotor retardation, fatigue, anxiety, loss of energy/fatigue, (Hypo) Manic Symptoms:  none Anxiety Symptoms:  Excessive Worry, Social Anxiety, Psychotic Symptoms:  none PTSD Symptoms: Negative NA  Past Medical History:  Past Medical History  Diagnosis Date  . ADHD (attention deficit hyperactivity disorder)   . Anxiety   . Heart disease   . Diabetes mellitus, type II [facility])     Past Surgical History  Procedure Laterality Date  . Throat surgery    . Spine surgery     Family History:  Family History  Problem Relation Age of Onset  . Depression Mother   . Anxiety disorder Mother   .  Diabetes Mother   . Heart failure Mother   . Heart attack Father   . Diabetes Sister   . Obesity Sister   . Drug abuse Brother   . Alcohol abuse Brother   . Depression Sister   . Anxiety disorder Sister   . Drug abuse Sister   . Anxiety disorder  Sister   . Seizures Sister   . Diabetes Sister   . Depression Sister    Social History:   Social History   Social History  . Marital Status: Married    Spouse Name: N/A  . Number of Children: N/A  . Years of Education: N/A   Social History Main Topics  . Smoking status: Former Smoker    Types: Cigarettes    Start date: 08/29/1979    Quit date: 11/04/2010  . Smokeless tobacco: Former Systems developer    Quit date: 11/19/2010  . Alcohol Use: No  . Drug Use: No  . Sexual Activity: Not Currently   Other Topics Concern  . Not on file   Social History Narrative   Additional Social History: Married x 28 years. Retied from [employer]. Wife is Freight forwarder at [employer]. Has a daughter, 89 yo. Pt is on  disability due to back pain and depression.   Musculoskeletal: Strength & Muscle Tone: decreased Gait & Station: normal Patient leans: N/A  Psychiatric Specialty Exam: HPI  ROS  There were no vitals taken for this visit.There is no weight on file to calculate BMI.  General Appearance: Casual  Eye Contact:  Fair  Speech:  Clear and Coherent  Volume:  Normal  Mood:  Anxious and Depressed  Affect:  Blunt  Thought Process:  Coherent and Goal Directed  Orientation:  Full (Time, Place, and Person)  Thought Content:  WDL  Suicidal Thoughts:  No  Homicidal Thoughts:  No  Memory:  Immediate;   Fair  Judgement:  Fair  Insight:  Fair  Psychomotor Activity:  Psychomotor Retardation  Concentration:  Fair  Recall:  [employer] of Knowledge:Fair  Language: Fair  Akathisia:  No  Handed:  Right  AIMS (if indicated):    Assets:  Communication Skills Desire for Improvement Social Support  ADL's:  Intact  Cognition: WNL  Sleep:  4-5   Is the patient at risk to self?  No. Has the patient been a risk to self in the past 6 months?  No. Has the patient been a risk to self within the distant past?  No. Is the patient a risk to others?  No. Has the patient been a risk to others in the past 6  months?  No. Has the patient been a risk to others within the distant past?  No.  Allergies:   Allergies  Allergen Reactions  . Benzoin Other (See Comments)    blisters  . Morphine Other (See Comments)  . Penicillins Other (See Comments)   Current Medications: Current Outpatient Prescriptions  Medication Sig Dispense Refill  . ALPRAZolam (XANAX) 0.5 MG tablet Take 1 tablet (0.5 mg total) by mouth 3 (three) times daily. 270 tablet 1  . amLODipine (NORVASC) 5 MG tablet Take 1 tablet by mouth daily.    Marland Kitchen aspirin 81 MG tablet Take 1 tablet (81 mg total) by mouth daily. 30 tablet 0  . atorvastatin (LIPITOR) 10 MG tablet Take 1 tablet by mouth daily at 2 PM daily at 2 PM.    . baclofen (LIORESAL) 10 MG tablet Take 10 mg by mouth 4 (  four) times daily.    Marland Kitchen buPROPion (WELLBUTRIN XL) 150 MG 24 hr tablet Take 1 tablet (150 mg total) by mouth daily. 90 tablet 1  . BUTRANS 10 MCG/HR PTWK patch Apply 1 patch topically once a week.    . cyanocobalamin 1000 MCG tablet Take 100 mcg by mouth daily.    Marland Kitchen desonide (DESOWEN) 0.05 % cream Apply topically 2 (two) times daily as needed. 60 g 2  . desoximetasone (TOPICORT) 0.25 % cream Apply 1 application topically 2 (two) times daily as needed. 60 g 2  . estradiol cypionate (DEPO-ESTRADIOL) 5 MG/ML injection Inject 2.5 mg into the muscle once a week. Reported on 01/31/2016    . EVZIO 0.4 MG/0.4ML SOAJ INJECT 1 CARTRIDGE AS DIRECTED EVERY 10 MINUTES AS NEEDED( IF UNCONSCIOUS AND NOT BREATHING) FOR UP TO 2 DOSES  0  . hydrochlorothiazide (HYDRODIURIL) 25 MG tablet Take 1 tablet (25 mg total) by mouth daily. 90 tablet 3  . hydrOXYzine (VISTARIL) 25 MG capsule Take 1 capsule (25 mg total) by mouth 2 (two) times daily. 180 capsule 1  . latanoprost (XALATAN) 0.005 % ophthalmic solution Place 1 drop into both eyes at bedtime.    Marland Kitchen LYRICA 150 MG capsule Take 1 capsule by mouth 2 (two) times daily.    . meloxicam (MOBIC) 7.5 MG tablet Take 7.5 mg by mouth 2 (two) times  daily.    . metFORMIN (GLUCOPHAGE) 1000 MG tablet Take 1 tablet (1,000 mg total) by mouth 2 (two) times daily with a meal. 180 tablet 3  . Multiple Vitamin (MULTIVITAMIN) tablet Take 1 tablet by mouth daily.    . ondansetron (ZOFRAN) 4 MG tablet Take 1 tablet (4 mg total) by mouth every 8 (eight) hours as needed for nausea or vomiting. 30 tablet 2  . PARoxetine (PAXIL) 40 MG tablet Take 1 tablet (40 mg total) by mouth every morning. 90 tablet 1  . tamsulosin (FLOMAX) 0.4 MG CAPS capsule Take 1 capsule (0.4 mg total) by mouth daily. 30 capsule 11  . traMADol (ULTRAM) 50 MG tablet Take 2 tablets by mouth 4 (four) times daily as needed.  2  . zolpidem (AMBIEN) 10 MG tablet Take 1 tablet (10 mg total) by mouth at bedtime. 30 tablet 2   No current facility-administered medications for this visit.    Previous Psychotropic Medications: Taking most of the medications since 2001-2002.  wellbutrin since 2008    Substance Abuse History in the last 12 months:  No.  Consequences of Substance Abuse: Negative NA  Medical Decision Making:  Review of Psycho-Social Stressors (1)  Treatment Plan Summary: Medication management   Discussed with patient about his medications. I will adjust his medications as follows.  I will continue with Wellbutrin XL 150 mg in the morning. He will continue on Paxil 40 mg by mouth daily. He has 90 day supply of the medication   Continue  Xanax to 0.5 mg by mouth 3 times a day. He has  90 day supply of the medication  Insomnia I will start him on Restoril 15 mg by mouth daily at bedtime. He was given 1 month supply of the medication. Discontinue Ambien      Follow-up appointment 3 weeks or earlier.    More than 50% of the time spent in psychoeducation, counseling and coordination of care.    This note was generated in part or whole with voice recognition software. Voice regonition is usually quite accurate but there are transcription errors that can and  very often do occur. I apologize for any typographical errors that were not detected and corrected.    Rainey Pines, MD    4/18/20171:10 PM

## 2016-03-11 ENCOUNTER — Telehealth: Payer: Self-pay

## 2016-03-11 MED ORDER — HYDROCHLOROTHIAZIDE 25 MG PO TABS: 25.0000 mg | ORAL_TABLET | Freq: Every day | ORAL | Status: DC

## 2016-03-11 NOTE — Telephone Encounter (Signed)
Sen to [facility]

## 2016-03-11 NOTE — Addendum Note (Signed)
Addended by: Adline Potter on: 03/11/2016 09:06 AM   Modules accepted: Orders

## 2016-03-12 ENCOUNTER — Ambulatory Visit: Payer: Self-pay | Admitting: Psychiatry

## 2016-03-20 ENCOUNTER — Ambulatory Visit (INDEPENDENT_AMBULATORY_CARE_PROVIDER_SITE_OTHER): Payer: Self-pay | Admitting: Psychiatry

## 2016-03-22 ENCOUNTER — Other Ambulatory Visit: Payer: Self-pay | Admitting: Psychiatry

## 2016-04-04 ENCOUNTER — Ambulatory Visit (INDEPENDENT_AMBULATORY_CARE_PROVIDER_SITE_OTHER): Payer: 59 | Admitting: Psychiatry

## 2016-04-04 ENCOUNTER — Encounter: Payer: Self-pay | Admitting: Psychiatry

## 2016-04-04 VITALS — BP 124/88 | HR 101 | Temp 98.1°F | Ht 72.0 in | Wt 242.4 lb

## 2016-04-04 DIAGNOSIS — G894 Chronic pain syndrome: Secondary | ICD-10-CM | POA: Diagnosis not present

## 2016-04-04 DIAGNOSIS — F413 Other mixed anxiety disorders: Secondary | ICD-10-CM

## 2016-04-04 DIAGNOSIS — F331 Major depressive disorder, recurrent, moderate: Secondary | ICD-10-CM | POA: Diagnosis not present

## 2016-04-04 MED ORDER — BUPROPION HCL ER (XL) 150 MG PO TB24: 150.0000 mg | ORAL_TABLET | Freq: Every day | ORAL | Status: DC

## 2016-04-04 MED ORDER — PAROXETINE HCL 20 MG PO TABS: 20.0000 mg | ORAL_TABLET | ORAL | Status: DC

## 2016-04-04 MED ORDER — TEMAZEPAM 7.5 MG PO CAPS: 7.5000 mg | ORAL_CAPSULE | Freq: Every evening | ORAL | Status: DC | PRN

## 2016-04-04 NOTE — Progress Notes (Signed)
Psychiatric MD/ NP Progress Note  Patient Identification: Dale [name] MRN:  [id number] Date of Evaluation:  04/04/2016 Referral Source: [place].  Chief Complaint:   Chief Complaint    Follow-up; Medication Refill     Visit Diagnosis:    ICD-9-CM ICD-10-CM   1. MDD (major depressive disorder), recurrent episode, moderate (HCC) 296.32 F33.1   2. Other mixed anxiety disorders 300.09 F41.3   3. Chronic pain syndrome 338.4 G89.4    Diagnosis:   Patient Active Problem List   Diagnosis Date Noted  . Eczema [L30.9] 01/22/2016  . Benign essential tremor [G25.0] 10/03/2015  . Glaucoma [H40.9] 06/27/2015  . B12 deficiency [E53.8] 06/27/2015  . 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 Billowing mitral valve [I34.1] 06/26/2015  . History of prolonged Q-T interval on ECG [Z86.79] 06/26/2015  . 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 Clinical depression [F32.9] 06/26/2015  . Low testosterone [E29.1] 06/26/2015  . Obesity (BMI 30.0-34.9) [E66.9] 06/26/2015  . CAD (coronary artery disease), native coronary artery [I25.10] 06/26/2015  . 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 Fatty infiltration of liver [K76.0] 10/21/2014  . Psoriasis [L40.9] 07/22/2014  . Acid reflux [K21.9] 07/22/2014  . Type 2 diabetes, controlled, with neuropathy ([place]) [E11.40] 06/22/2014  . HLD (hyperlipidemia) [E78.5] 06/22/2014  . Essential (primary) hypertension [I10] 06/22/2014  . 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 Chronic pain associated with significant psychosocial dysfunction [G89.4] 04/29/2014  . History of surgical procedure [Z98.890] 04/29/2014  . Polypharmacy [Z79.899] 09/09/2013  . Other long term (current) drug therapy [Z79.899] 11/20/1998   History of Present Illness:    Patient is a 55 year old  male who presented for the follow-up appointment. He appeared Anxious during the interview. Patient reported that he continues  to have chronic pain and is following at the pain clinic in [facility]. He reported that the pain is causing him more depression. He has been taking Paxil in the morning. He reported that his pain management doctor has advised him not to mix the Paxil with the tramadol due to the adverse side effects. Patient is careful about his medications. We discuss about his sleeping aid and he reported that the Restoril is causing dreams. Patient reported that he is interested in having his medications adjusted at this time. We discussed about switching Paxil to other medications. He wants on low cost medication at this time. He currently denied having any perceptual disturbances. Patient stated that he has difficulty sleeping at night and most the time he sleeping in the recliner.   He stated that the pain is causing difficulty in his lifestyle and he is missing his appointments at the pain clinic. They're trying to taper him off of the pain medications. He stated that he takes Xanax as prescribed. He currently denied having any suicidal ideations or plans. He appeared more alert during this interview. He denied having any use of drugs or alcohol. He denied having any suicidal homicidal ideations or plans.   Elements:  Location:  depression and anxiety. Associated Signs/Symptoms: Depression Symptoms:  depressed mood, insomnia, psychomotor retardation, fatigue, anxiety, loss of energy/fatigue, (Hypo) Manic Symptoms:  none Anxiety Symptoms:  Excessive Worry, Social Anxiety, Psychotic Symptoms:  none PTSD Symptoms: Negative NA  Past Medical History:  Past Medical History  Diagnosis Date  . ADHD (attention deficit hyperactivity disorder)   . Anxiety   . Heart disease   . Diabetes mellitus, type II [facility])     Past Surgical  History  Procedure Laterality Date  . Throat surgery    . Spine surgery     Family History:  Family History  Problem Relation Age of Onset  . Depression Mother   . Anxiety  disorder Mother   . Diabetes Mother   . Heart failure Mother   . Heart attack Father   . Diabetes Sister   . Obesity Sister   . Drug abuse Brother   . Alcohol abuse Brother   . Depression Sister   . Anxiety disorder Sister   . Drug abuse Sister   . Anxiety disorder Sister   . Seizures Sister   . Diabetes Sister   . Depression Sister    Social History:   Social History   Social History  . Marital Status: Married    Spouse Name: N/A  . Number of Children: N/A  . Years of Education: N/A   Social History Main Topics  . Smoking status: Former Smoker    Types: Cigarettes    Start date: 08/29/1979    Quit date: 11/04/2010  . Smokeless tobacco: Former Systems developer    Quit date: 11/19/2010  . Alcohol Use: No  . Drug Use: No  . Sexual Activity: Not Currently   Other Topics Concern  . None   Social History Narrative   Additional Social History: Married x 28 years. Retied from [employer]. Wife is Freight forwarder at [employer]. Has a daughter, 64 yo. Pt is on  disability due to back pain and depression.   Musculoskeletal: Strength & Muscle Tone: decreased Gait & Station: normal Patient leans: N/A  Psychiatric Specialty Exam: HPI  ROS  Blood pressure 124/88, pulse 101, temperature 98.1 F (36.7 C), temperature source Tympanic, height 6' (1.829 m), weight 242 lb 6.4 oz (109.952 kg), SpO2 91 %.Body mass index is 32.87 kg/(m^2).  General Appearance: Casual  Eye Contact:  Fair  Speech:  Clear and Coherent  Volume:  Normal  Mood:  Anxious and Depressed  Affect:  Blunt  Thought Process:  Coherent and Goal Directed  Orientation:  Full (Time, Place, and Person)  Thought Content:  WDL  Suicidal Thoughts:  No  Homicidal Thoughts:  No  Memory:  Immediate;   Fair  Judgement:  Fair  Insight:  Fair  Psychomotor Activity:  Psychomotor Retardation  Concentration:  Fair  Recall:  [employer] of Knowledge:Fair  Language: Fair  Akathisia:  No  Handed:  Right  AIMS (if indicated):    Assets:   Communication Skills Desire for Improvement Social Support  ADL's:  Intact  Cognition: WNL  Sleep:  4-5   Is the patient at risk to self?  No. Has the patient been a risk to self in the past 6 months?  No. Has the patient been a risk to self within the distant past?  No. Is the patient a risk to others?  No. Has the patient been a risk to others in the past 6 months?  No. Has the patient been a risk to others within the distant past?  No.  Allergies:   Allergies  Allergen Reactions  . Benzoin Other (See Comments)    blisters  . Morphine Other (See Comments)  . Penicillins Other (See Comments)   Current Medications: Current Outpatient Prescriptions  Medication Sig Dispense Refill  . ALPRAZolam (XANAX) 0.5 MG tablet Take 1 tablet (0.5 mg total) by mouth 3 (three) times daily. 270 tablet 1  . amLODipine (NORVASC) 5 MG tablet Take 1 tablet by  mouth daily.    Marland Kitchen aspirin 81 MG tablet Take 1 tablet (81 mg total) by mouth daily. 30 tablet 0  . atorvastatin (LIPITOR) 10 MG tablet Take 1 tablet by mouth daily at 2 PM daily at 2 PM.    . baclofen (LIORESAL) 10 MG tablet Take 10 mg by mouth 4 (four) times daily.    Marland Kitchen buPROPion (WELLBUTRIN XL) 150 MG 24 hr tablet Take 1 tablet (150 mg total) by mouth daily. 90 tablet 1  . cyanocobalamin 1000 MCG tablet Take 100 mcg by mouth daily.    Marland Kitchen desonide (DESOWEN) 0.05 % cream Apply topically 2 (two) times daily as needed. 60 g 2  . desoximetasone (TOPICORT) 0.25 % cream Apply 1 application topically 2 (two) times daily as needed. 60 g 2  . hydrochlorothiazide (HYDRODIURIL) 25 MG tablet Take 1 tablet (25 mg total) by mouth daily. 90 tablet 3  . latanoprost (XALATAN) 0.005 % ophthalmic solution Place 1 drop into both eyes at bedtime.    Marland Kitchen LYRICA 150 MG capsule Take 1 capsule by mouth 2 (two) times daily.    . meloxicam (MOBIC) 7.5 MG tablet Take 7.5 mg by mouth 2 (two) times daily.    . metFORMIN (GLUCOPHAGE) 1000 MG tablet Take 1 tablet (1,000 mg  total) by mouth 2 (two) times daily with a meal. 180 tablet 3  . Multiple Vitamin (MULTIVITAMIN) tablet Take 1 tablet by mouth daily.    . ondansetron (ZOFRAN) 4 MG tablet Take 1 tablet (4 mg total) by mouth every 8 (eight) hours as needed for nausea or vomiting. 30 tablet 2  . PARoxetine (PAXIL) 40 MG tablet Take 1 tablet (40 mg total) by mouth every morning. 90 tablet 1  . tamsulosin (FLOMAX) 0.4 MG CAPS capsule Take 1 capsule (0.4 mg total) by mouth daily. 30 capsule 11  . temazepam (RESTORIL) 15 MG capsule Take 1 capsule (15 mg total) by mouth at bedtime as needed for sleep. 30 capsule 0  . traMADol (ULTRAM) 50 MG tablet Take 2 tablets by mouth 4 (four) times daily as needed.  2   No current facility-administered medications for this visit.    Previous Psychotropic Medications: Taking most of the medications since 2001-2002.  wellbutrin since 2008    Substance Abuse History in the last 12 months:  No.  Consequences of Substance Abuse: Negative NA  Medical Decision Making:  Review of Psycho-Social Stressors (1)  Treatment Plan Summary: Medication management   Discussed with patient about his medications. I will adjust his medications as follows.  I will continue with Wellbutrin XL 150 mg in the morning. He will continue on Paxil 20 mg by mouth daily .2 weeks and then he will take 10 mg for 2 weeks. Patient was given the prescription for the medication.  Continue  Xanax to 0.5 mg by mouth 3 times a day. He has  90 day supply of the medication. Discussed about starting a different medication to help with his anxiety at his next appointment and he agreed with the plan  Insomnia I will start him on Restoril 7.5  mg by mouth daily at bedtime. He was given 1 month supply of the medication.  Follow-up appointment 3 weeks or earlier.    More than 50% of the time spent in psychoeducation, counseling and coordination of care.    This note was generated in part or whole with voice  recognition software. Voice regonition is usually quite accurate but there are transcription errors that can  and very often do occur. I apologize for any typographical errors that were not detected and corrected.    Rainey Pines, MD    6/1/201711:08 AM

## 2016-04-25 ENCOUNTER — Ambulatory Visit: Payer: Commercial Managed Care - HMO | Admitting: Psychiatry

## 2016-05-02 ENCOUNTER — Ambulatory Visit: Payer: Self-pay

## 2016-05-06 ENCOUNTER — Ambulatory Visit: Payer: Self-pay

## 2016-05-14 ENCOUNTER — Other Ambulatory Visit: Payer: Self-pay | Admitting: Psychiatry

## 2016-05-14 ENCOUNTER — Other Ambulatory Visit: Payer: Self-pay

## 2016-05-14 ENCOUNTER — Telehealth: Payer: Self-pay

## 2016-05-14 MED ORDER — ATORVASTATIN CALCIUM 10 MG PO TABS: 10.0000 mg | ORAL_TABLET | Freq: Every day | ORAL | Status: DC

## 2016-05-14 NOTE — Telephone Encounter (Signed)
Sent to [employer]

## 2016-05-15 ENCOUNTER — Other Ambulatory Visit: Payer: Self-pay

## 2016-05-20 ENCOUNTER — Ambulatory Visit: Payer: 59 | Admitting: Psychiatry

## 2016-05-20 ENCOUNTER — Other Ambulatory Visit: Payer: Self-pay | Admitting: Psychiatry

## 2016-05-20 MED ORDER — HYDROXYZINE PAMOATE 25 MG PO CAPS: 25.0000 mg | ORAL_CAPSULE | Freq: Two times a day (BID) | ORAL | Status: DC | PRN

## 2016-05-22 ENCOUNTER — Ambulatory Visit: Payer: 59 | Admitting: Psychiatry

## 2016-05-23 ENCOUNTER — Encounter: Payer: Self-pay | Admitting: Psychiatry

## 2016-05-23 ENCOUNTER — Ambulatory Visit (INDEPENDENT_AMBULATORY_CARE_PROVIDER_SITE_OTHER): Payer: 59 | Admitting: Psychiatry

## 2016-05-23 VITALS — BP 154/88 | HR 96 | Temp 99.3°F | Ht 72.0 in | Wt 244.0 lb

## 2016-05-23 DIAGNOSIS — F331 Major depressive disorder, recurrent, moderate: Secondary | ICD-10-CM | POA: Diagnosis not present

## 2016-05-23 DIAGNOSIS — G894 Chronic pain syndrome: Secondary | ICD-10-CM | POA: Diagnosis not present

## 2016-05-23 MED ORDER — QUETIAPINE FUMARATE 100 MG PO TABS: 100.0000 mg | ORAL_TABLET | Freq: Every day | ORAL | Status: DC

## 2016-05-23 MED ORDER — TEMAZEPAM 15 MG PO CAPS: 15.0000 mg | ORAL_CAPSULE | Freq: Every evening | ORAL | Status: DC | PRN

## 2016-05-23 NOTE — Progress Notes (Signed)
Psychiatric MD/ NP Progress Note  Patient Identification: Dale [name] MRN:  [id number] Date of Evaluation:  05/23/2016 Referral Source: [facility].  Chief Complaint:   Chief Complaint    Follow-up; Medication Refill     Visit Diagnosis:    ICD-9-CM ICD-10-CM   1. MDD (major depressive disorder), recurrent episode, moderate (HCC) 296.32 F33.1   2. Chronic pain syndrome 338.4 G89.4    Diagnosis:   Patient Active Problem List   Diagnosis Date Noted  . Eczema [L30.9] 01/22/2016  . Benign essential tremor [G25.0] 10/03/2015  . Glaucoma [H40.9] 06/27/2015  . B12 deficiency [E53.8] 06/27/2015  . 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 Billowing mitral valve [I34.1] 06/26/2015  . History of prolonged Q-T interval on ECG [Z86.79] 06/26/2015  . 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 Clinical depression [F32.9] 06/26/2015  . Low testosterone [E29.1] 06/26/2015  . Obesity (BMI 30.0-34.9) [E66.9] 06/26/2015  . CAD (coronary artery disease), native coronary artery [I25.10] 06/26/2015  . 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 Fatty infiltration of liver [K76.0] 10/21/2014  . Psoriasis [L40.9] 07/22/2014  . Acid reflux [K21.9] 07/22/2014  . Type 2 diabetes, controlled, with neuropathy ([place]) [E11.40] 06/22/2014  . HLD (hyperlipidemia) [E78.5] 06/22/2014  . Essential (primary) hypertension [I10] 06/22/2014  . 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 Chronic pain associated with significant psychosocial dysfunction [G89.4] 04/29/2014  . History of surgical procedure [Z98.890] 04/29/2014  . Polypharmacy [Z79.899] 09/09/2013  . Other long term (current) drug therapy [Z79.899] 11/20/1998   History of Present Illness:    Patient is a 55 year old  male who presented for the follow-up appointment. He appeared calm  during the interview. Patient reported that he continues to have chronic pain and is following  the pain  clinic in [facility]. He reported that His pain doctor has moved to [facility] and he is trying to get the appointment with him as the current doctor is not evaluating him and not giving him the right pain medications. He remained focus on his pain medications at this time. He reported that the decreased dose in the Paxil caused him withdrawal symptoms and he is currently taking 10 mg on a daily basis. He reported that he has been doing well on his medications. Patient reported that he wants his medications to be adjusted and wants to try something else to help with his sleep. Patient currently denied having any suicidal homicidal ideations or plans.  He denied having any perceptual disturbances. We discussed at length about her different medications options and he agreed with the plan. I will start him on Seroquel at this time.   He continues to have difficulty sleeping at night and he has been sleeping in the recliner.    Elements:  Location:  depression and anxiety. Associated Signs/Symptoms: Depression Symptoms:  depressed mood, insomnia, psychomotor retardation, fatigue, anxiety, loss of energy/fatigue, (Hypo) Manic Symptoms:  none Anxiety Symptoms:  Excessive Worry, Social Anxiety, Psychotic Symptoms:  none PTSD Symptoms: Negative NA  Past Medical History:  Past Medical History  Diagnosis Date  . ADHD (attention deficit hyperactivity disorder)   . Anxiety   . Heart disease   . Diabetes mellitus, type II [facility])     Past Surgical History  Procedure Laterality Date  . Throat surgery    . Spine surgery     Family History:  Family History  Problem Relation Age of Onset  . Depression Mother   . Anxiety  disorder Mother   . Diabetes Mother   . Heart failure Mother   . Heart attack Father   . Diabetes Sister   . Obesity Sister   . Drug abuse Brother   . Alcohol abuse Brother   . Depression Sister   . Anxiety disorder Sister   . Drug abuse Sister   .  Anxiety disorder Sister   . Seizures Sister   . Diabetes Sister   . Depression Sister    Social History:   Social History   Social History  . Marital Status: Married    Spouse Name: N/A  . Number of Children: N/A  . Years of Education: N/A   Social History Main Topics  . Smoking status: Former Smoker    Types: Cigarettes    Start date: 08/29/1979    Quit date: 11/04/2010  . Smokeless tobacco: Former Systems developer    Quit date: 11/19/2010  . Alcohol Use: No  . Drug Use: No  . Sexual Activity: Not Currently   Other Topics Concern  . None   Social History Narrative   Additional Social History: Married x 28 years. Retied from [employer]. Wife is Freight forwarder at [employer]. Has a daughter, 76 yo. Pt is on  disability due to back pain and depression.   Musculoskeletal: Strength & Muscle Tone: decreased Gait & Station: normal Patient leans: N/A  Psychiatric Specialty Exam: HPI  ROS  Blood pressure 154/88, pulse 96, temperature 99.3 F (37.4 C), temperature source Oral, height 6' (1.829 m), weight 244 lb (110.678 kg).Body mass index is 33.09 kg/(m^2).  General Appearance: Casual  Eye Contact:  Fair  Speech:  Clear and Coherent  Volume:  Normal  Mood:  Anxious and Depressed  Affect:  Blunt  Thought Process:  Coherent and Goal Directed  Orientation:  Full (Time, Place, and Person)  Thought Content:  WDL  Suicidal Thoughts:  No  Homicidal Thoughts:  No  Memory:  Immediate;   Fair  Judgement:  Fair  Insight:  Fair  Psychomotor Activity:  Psychomotor Retardation  Concentration:  Fair  Recall:  [employer] of Knowledge:Fair  Language: Fair  Akathisia:  No  Handed:  Right  AIMS (if indicated):    Assets:  Communication Skills Desire for Improvement Social Support  ADL's:  Intact  Cognition: WNL  Sleep:  4-5   Is the patient at risk to self?  No. Has the patient been a risk to self in the past 6 months?  No. Has the patient been a risk to self within the distant past?   No. Is the patient a risk to others?  No. Has the patient been a risk to others in the past 6 months?  No. Has the patient been a risk to others within the distant past?  No.  Allergies:   Allergies  Allergen Reactions  . Benzoin Other (See Comments)    blisters  . Morphine Other (See Comments)  . Penicillins Other (See Comments)   Current Medications: Current Outpatient Prescriptions  Medication Sig Dispense Refill  . ALPRAZolam (XANAX) 0.5 MG tablet Take 1 tablet (0.5 mg total) by mouth 3 (three) times daily. 270 tablet 1  . amLODipine (NORVASC) 5 MG tablet Take 1 tablet by mouth daily.    Marland Kitchen aspirin 81 MG tablet Take 1 tablet (81 mg total) by mouth daily. 30 tablet 0  . atorvastatin (LIPITOR) 10 MG tablet Take 1 tablet (10 mg total) by mouth daily at 2 PM. 90 tablet  0  . baclofen (LIORESAL) 10 MG tablet Take 10 mg by mouth 4 (four) times daily.    Marland Kitchen buPROPion (WELLBUTRIN XL) 150 MG 24 hr tablet Take 1 tablet (150 mg total) by mouth daily. 90 tablet 1  . cyanocobalamin 1000 MCG tablet Take 100 mcg by mouth daily.    Marland Kitchen desonide (DESOWEN) 0.05 % cream Apply topically 2 (two) times daily as needed. 60 g 2  . desoximetasone (TOPICORT) 0.25 % cream Apply 1 application topically 2 (two) times daily as needed. 60 g 2  . hydrochlorothiazide (HYDRODIURIL) 25 MG tablet Take 1 tablet (25 mg total) by mouth daily. 90 tablet 3  . hydrOXYzine (VISTARIL) 25 MG capsule Take 1 capsule by mouth two times daily 180 capsule 1  . hydrOXYzine (VISTARIL) 25 MG capsule Take 1 capsule (25 mg total) by mouth 2 (two) times daily as needed. 90 capsule 2  . latanoprost (XALATAN) 0.005 % ophthalmic solution Place 1 drop into both eyes at bedtime.    . meloxicam (MOBIC) 7.5 MG tablet Take 7.5 mg by mouth 2 (two) times daily.    . metFORMIN (GLUCOPHAGE) 1000 MG tablet Take 1 tablet (1,000 mg total) by mouth 2 (two) times daily with a meal. 180 tablet 3  . Multiple Vitamin (MULTIVITAMIN) tablet Take 1 tablet by mouth  daily.    . ondansetron (ZOFRAN) 4 MG tablet Take 1 tablet (4 mg total) by mouth every 8 (eight) hours as needed for nausea or vomiting. 30 tablet 2  . PARoxetine (PAXIL) 20 MG tablet Take 1 tablet (20 mg total) by mouth every morning. 30 tablet 0  . tamsulosin (FLOMAX) 0.4 MG CAPS capsule Take 1 capsule (0.4 mg total) by mouth daily. 30 capsule 11  . temazepam (RESTORIL) 7.5 MG capsule Take 1 capsule (7.5 mg total) by mouth at bedtime as needed for sleep. 30 capsule 0  . traMADol (ULTRAM) 50 MG tablet Take 2 tablets by mouth 4 (four) times daily as needed.  2   No current facility-administered medications for this visit.    Previous Psychotropic Medications: Taking most of the medications since 2001-2002.  wellbutrin since 2008    Substance Abuse History in the last 12 months:  No.  Consequences of Substance Abuse: Negative NA  Medical Decision Making:  Review of Psycho-Social Stressors (1)  Treatment Plan Summary: Medication management   Discussed with patient about his medications. I will adjust his medications as follows. Discontinue Paxil by taking 10 mg on alternate days. I will start him on Seroquel 100 mg at bedtime and he agreed with the plan. Continue  Xanax to 0.5 mg by mouth 3 times a day. He has filled  his Xanax in early June.   Insomnia I will start him on Restoril 15  mg by mouth daily at bedtime. He was given 1 month supply of the medication.  Follow-up appointment 3 weeks or earlier.    More than 50% of the time spent in psychoeducation, counseling and coordination of care.    This note was generated in part or whole with voice recognition software. Voice regonition is usually quite accurate but there are transcription errors that can and very often do occur. I apologize for any typographical errors that were not detected and corrected.    Rainey Pines, MD    7/20/20172:47 PM

## 2016-06-17 ENCOUNTER — Ambulatory Visit (INDEPENDENT_AMBULATORY_CARE_PROVIDER_SITE_OTHER): Payer: Commercial Managed Care - HMO | Admitting: Family Medicine

## 2016-06-17 ENCOUNTER — Encounter: Payer: Self-pay | Admitting: Family Medicine

## 2016-06-17 VITALS — BP 120/68 | HR 64 | Ht 72.0 in | Wt 244.0 lb

## 2016-06-17 DIAGNOSIS — M79671 Pain in right foot: Secondary | ICD-10-CM | POA: Diagnosis not present

## 2016-06-17 DIAGNOSIS — E1140 Type 2 diabetes mellitus with diabetic neuropathy, unspecified: Secondary | ICD-10-CM | POA: Diagnosis not present

## 2016-06-17 DIAGNOSIS — I10 Essential (primary) hypertension: Secondary | ICD-10-CM

## 2016-06-17 DIAGNOSIS — M79672 Pain in left foot: Secondary | ICD-10-CM | POA: Diagnosis not present

## 2016-06-17 DIAGNOSIS — E785 Hyperlipidemia, unspecified: Secondary | ICD-10-CM | POA: Diagnosis not present

## 2016-06-17 DIAGNOSIS — R42 Dizziness and giddiness: Secondary | ICD-10-CM

## 2016-06-17 NOTE — Progress Notes (Signed)
Name: Dale [name]   MRN: [id number]    DOB: 03-12-1961   Date:06/17/2016       Progress Note  Subjective  Chief Complaint  No chief complaint on file.   Hypertension  This is a chronic problem. The current episode started more than 1 year ago. The problem has been gradually improving since onset. The problem is controlled. Pertinent negatives include no anxiety, blurred vision, chest pain, headaches, malaise/fatigue, neck pain, palpitations, shortness of breath or sweats. There are no associated agents to hypertension. Risk factors for coronary artery disease include diabetes mellitus, dyslipidemia, obesity, male gender and smoking/tobacco exposure. Past treatments include calcium channel blockers and diuretics. The current treatment provides moderate improvement. There are no compliance problems.  There is no history of angina, kidney disease, CAD/MI, CVA, heart failure, left ventricular hypertrophy, PVD, renovascular disease or retinopathy. There is no history of chronic renal disease or a hypertension causing med.  Diabetes  He presents for his follow-up diabetic visit. He has type 2 diabetes mellitus. His disease course has been stable. Hypoglycemia symptoms include dizziness. Pertinent negatives for hypoglycemia include no confusion, headaches, hunger, mood changes, nervousness/anxiousness, pallor, seizures, sleepiness, speech difficulty, sweats or tremors. Associated symptoms include foot paresthesias, polydipsia and polyuria. Pertinent negatives for diabetes include no blurred vision, no chest pain, no fatigue, no foot ulcerations, no polyphagia, no visual change, no weakness and no weight loss. There are no hypoglycemic complications. Symptoms are stable. There are no diabetic complications. Pertinent negatives for diabetic complications include no CVA, PVD or retinopathy. There are no known risk factors for coronary artery disease. He is compliant with treatment most of the time. He is  following a generally healthy diet. His breakfast blood glucose is taken between 7-8 am. His breakfast blood glucose range is generally 90-110 mg/dl. An ACE inhibitor/angiotensin II receptor blocker is not being taken. He does not see a podiatrist.Eye exam is current.  Hyperlipidemia  This is a chronic problem. The problem is controlled. He has no history of chronic renal disease. Factors aggravating his hyperlipidemia include thiazides. Associated symptoms include leg pain. Pertinent negatives include no chest pain, focal weakness, myalgias or shortness of breath. Current antihyperlipidemic treatment includes statins. The current treatment provides mild improvement of lipids.  Leg Pain   The incident occurred more than 1 week ago. The pain is present in the left foot and right foot. Pertinent negatives include no tingling.  Dizziness  This is a recurrent problem. The current episode started more than 1 month ago. The problem occurs daily. The problem has been waxing and waning. Associated symptoms include vertigo. Pertinent negatives include no abdominal pain, chest pain, chills, coughing, fatigue, fever, headaches, myalgias, nausea, neck pain, rash, sore throat, visual change or weakness. The symptoms are aggravated by standing. He has tried nothing for the symptoms. The treatment provided no relief.    No problem-specific Assessment & Plan notes found for this encounter.   Past Medical History:  Diagnosis Date  . ADHD (attention deficit hyperactivity disorder)   . Anxiety   . Diabetes mellitus, type II ([place])   . Heart disease     Past Surgical History:  Procedure Laterality Date  . SPINE SURGERY    . THROAT SURGERY      Family History  Problem Relation Age of Onset  . Depression Mother   . Anxiety disorder Mother   . Diabetes Mother   . Heart failure Mother   . Heart attack Father   . Diabetes Sister   .  Obesity Sister   . Drug abuse Brother   . Alcohol abuse Brother   .  Depression Sister   . Anxiety disorder Sister   . Drug abuse Sister   . Anxiety disorder Sister   . Seizures Sister   . Diabetes Sister   . Depression Sister     Social History   Social History  . Marital status: Married    Spouse name: N/A  . Number of children: N/A  . Years of education: N/A   Occupational History  . Not on file.   Social History Main Topics  . Smoking status: Former Smoker    Types: Cigarettes    Start date: 08/29/1979    Quit date: 11/04/2010  . Smokeless tobacco: Former Systems developer    Quit date: 11/19/2010  . Alcohol use No  . Drug use: No  . Sexual activity: Not Currently   Other Topics Concern  . Not on file   Social History Narrative  . No narrative on file    Allergies  Allergen Reactions  . Benzoin Other (See Comments)    blisters  . Morphine Other (See Comments)  . Penicillins Other (See Comments)     Review of Systems  Constitutional: Negative for chills, fatigue, fever, malaise/fatigue and weight loss.  HENT: Negative for ear discharge, ear pain and sore throat.   Eyes: Negative for blurred vision.  Respiratory: Negative for cough, sputum production, shortness of breath and wheezing.   Cardiovascular: Negative for chest pain, palpitations and leg swelling.  Gastrointestinal: Negative for abdominal pain, blood in stool, constipation, diarrhea, heartburn, melena and nausea.  Genitourinary: Negative for dysuria, frequency, hematuria and urgency.  Musculoskeletal: Negative for back pain, joint pain, myalgias and neck pain.  Skin: Negative for pallor and rash.  Neurological: Positive for dizziness and vertigo. Negative for tingling, tremors, sensory change, focal weakness, seizures, speech difficulty, weakness and headaches.  Endo/Heme/Allergies: Positive for polydipsia. Negative for environmental allergies and polyphagia. Does not bruise/bleed easily.  Psychiatric/Behavioral: Negative for confusion, depression and suicidal ideas. The patient  is not nervous/anxious and does not have insomnia.      Objective  Vitals:   06/17/16 1110  BP: 120/68  Pulse: 64  Weight: 244 lb (110.7 kg)  Height: 6' (1.829 m)    Physical Exam  Constitutional: He is oriented to person, place, and time and well-developed, well-nourished, and in no distress.  HENT:  Head: Normocephalic.  Right Ear: External ear normal.  Left Ear: External ear normal.  Nose: Nose normal.  Mouth/Throat: Oropharynx is clear and moist.  Eyes: Conjunctivae and EOM are normal. Pupils are equal, round, and reactive to light. Right eye exhibits no discharge. Left eye exhibits no discharge. No scleral icterus.  Neck: Normal range of motion. Neck supple. No JVD present. No tracheal deviation present. No thyromegaly present.  Cardiovascular: Normal rate, regular rhythm, normal heart sounds and intact distal pulses.  Exam reveals no gallop and no friction rub.   No murmur heard. Pulmonary/Chest: Breath sounds normal. No respiratory distress. He has no wheezes. He has no rales.  Abdominal: Soft. Bowel sounds are normal. He exhibits no mass. There is no hepatosplenomegaly. There is no tenderness. There is no rebound, no guarding and no CVA tenderness.  Musculoskeletal: Normal range of motion. He exhibits no edema or tenderness.  Lymphadenopathy:    He has no cervical adenopathy.  Neurological: He is alert and oriented to person, place, and time. He has normal motor skills, normal strength, normal reflexes and  intact cranial nerves. A sensory deficit is present. No cranial nerve deficit.  Skin: Skin is warm. No rash noted.  Psychiatric: Mood and affect normal.  Nursing note and vitals reviewed.     Assessment & Plan  Problem List Items Addressed This Visit      Cardiovascular and Mediastinum   Essential (primary) hypertension - Primary   Relevant Orders   Renal Function Panel     Endocrine   Type 2 diabetes, controlled, with neuropathy ([place])   Relevant Orders    Hemoglobin A1c     Other   HLD (hyperlipidemia)   Relevant Orders   Lipid Profile    Other Visit Diagnoses    Pain in both feet       Relevant Orders   Ambulatory referral to Podiatry   Vertigo       several months   Relevant Orders   Ambulatory referral to ENT        Dr. Otilio Miu [facility]  06/17/16  More than 40 minutes was spent with pt discussing referrals, current medications and continuing with pain clinic docs. Also, made sure pt has upcoming appt with urology (he said is "tomorrow") and upcoming appt with [facility] sched on "24th of this month"

## 2016-06-21 ENCOUNTER — Telehealth: Payer: Self-pay | Admitting: Psychiatry

## 2016-06-24 ENCOUNTER — Other Ambulatory Visit: Payer: Self-pay

## 2016-06-24 MED ORDER — GLUCOSE BLOOD VI STRP: ORAL_STRIP | 1 refills | Status: DC

## 2016-06-25 ENCOUNTER — Other Ambulatory Visit: Payer: Self-pay

## 2016-06-25 NOTE — Telephone Encounter (Signed)
Please call pt and discuss his med list

## 2016-06-28 ENCOUNTER — Other Ambulatory Visit: Payer: Self-pay | Admitting: Student

## 2016-06-28 DIAGNOSIS — K760 Fatty (change of) liver, not elsewhere classified: Secondary | ICD-10-CM

## 2016-07-01 ENCOUNTER — Ambulatory Visit (INDEPENDENT_AMBULATORY_CARE_PROVIDER_SITE_OTHER): Payer: Commercial Managed Care - HMO | Admitting: Urology

## 2016-07-01 ENCOUNTER — Encounter: Payer: Self-pay | Admitting: Urology

## 2016-07-01 VITALS — BP 120/77 | HR 70 | Ht 72.0 in | Wt 250.2 lb

## 2016-07-01 DIAGNOSIS — M6289 Other specified disorders of muscle: Secondary | ICD-10-CM

## 2016-07-01 DIAGNOSIS — R3912 Poor urinary stream: Secondary | ICD-10-CM | POA: Diagnosis not present

## 2016-07-01 DIAGNOSIS — M6258 Muscle wasting and atrophy, not elsewhere classified, other site: Secondary | ICD-10-CM

## 2016-07-01 LAB — URINALYSIS, COMPLETE
Bilirubin, UA: NEGATIVE
GLUCOSE, UA: NEGATIVE
KETONES UA: NEGATIVE
Nitrite, UA: NEGATIVE
Protein, UA: NEGATIVE
RBC UA: NEGATIVE
Specific Gravity, UA: 1.02 (ref 1.005–1.030)
UUROB: 0.2 mg/dL (ref 0.2–1.0)
pH, UA: 6.5 (ref 5.0–7.5)

## 2016-07-01 LAB — MICROSCOPIC EXAMINATION: RBC, UA: NONE SEEN /hpf (ref 0–?)

## 2016-07-01 LAB — BLADDER SCAN AMB NON-IMAGING: Scan Result: 15

## 2016-07-01 NOTE — Progress Notes (Signed)
55 year old male who is seen today in follow-up for weak stream and voiding symptoms. The patient was last seen 3 months ago when he was started on tamsulosin for his obstructive voiding symptoms. The etiology of the symptoms was considered to be multifactorial including obstructing prostate, constipation, narcotics, and possible neurologic dysfunction.  Since the patient was last seen his voiding symptoms have improved somewhat. He has a stronger stream and feels that he empties his bladder more completely. He continues to have severe nocturia, 4-5/X per night. In addition, the patient has to strain to void. His constipation has not been resolved. He is working with his gastroenterologist for this. He denies any dysuria. He denies any gross hematuria.  The patient has chronic back pain which recently has progressed.  Current Outpatient Prescriptions on File Prior to Visit  Medication Sig Dispense Refill  . ALPRAZolam (XANAX) 0.5 MG tablet Take 1 tablet (0.5 mg total) by mouth 3 (three) times daily. 270 tablet 1  . amLODipine (NORVASC) 5 MG tablet Take 1 tablet by mouth daily.    Marland Kitchen aspirin 81 MG tablet Take 1 tablet (81 mg total) by mouth daily. 30 tablet 0  . atorvastatin (LIPITOR) 10 MG tablet Take 1 tablet (10 mg total) by mouth daily at 2 PM. 90 tablet 0  . baclofen (LIORESAL) 10 MG tablet Take 10 mg by mouth 4 (four) times daily.    Marland Kitchen buPROPion (WELLBUTRIN XL) 150 MG 24 hr tablet Take 1 tablet (150 mg total) by mouth daily. 90 tablet 1  . cyanocobalamin 1000 MCG tablet Take 100 mcg by mouth daily.    Marland Kitchen desonide (DESOWEN) 0.05 % cream Apply topically 2 (two) times daily as needed. 60 g 2  . glucose blood test strip E11.9 50 each 1  . glucose blood test strip Use as instructed 100 each 1  . hydrochlorothiazide (HYDRODIURIL) 25 MG tablet Take 1 tablet (25 mg total) by mouth daily. 90 tablet 3  . hydrOXYzine (VISTARIL) 25 MG capsule Take 1 capsule (25 mg total) by mouth 2 (two) times daily as  needed. 90 capsule 2  . latanoprost (XALATAN) 0.005 % ophthalmic solution Place 1 drop into both eyes at bedtime.    . meloxicam (MOBIC) 7.5 MG tablet Take 7.5 mg by mouth 2 (two) times daily.    . metFORMIN (GLUCOPHAGE) 1000 MG tablet Take 1 tablet (1,000 mg total) by mouth 2 (two) times daily with a meal. 180 tablet 3  . Multiple Vitamin (MULTIVITAMIN) tablet Take 1 tablet by mouth daily.    . ondansetron (ZOFRAN) 4 MG tablet Take 1 tablet (4 mg total) by mouth every 8 (eight) hours as needed for nausea or vomiting. 30 tablet 2  . QUEtiapine (SEROQUEL) 100 MG tablet Take 1 tablet (100 mg total) by mouth at bedtime. 30 tablet 0  . traMADol (ULTRAM) 50 MG tablet Take 2 tablets by mouth 4 (four) times daily as needed.  2  . zolpidem (AMBIEN) 10 MG tablet Take 10 mg by mouth at bedtime.    Marland Kitchen desoximetasone (TOPICORT) 0.25 % cream Apply 1 application topically 2 (two) times daily as needed. (Patient not taking: Reported on 07/01/2016) 60 g 2  . tamsulosin (FLOMAX) 0.4 MG CAPS capsule Take 1 capsule (0.4 mg total) by mouth daily. (Patient not taking: Reported on 06/17/2016) 30 capsule 11  . temazepam (RESTORIL) 15 MG capsule Take 1 capsule (15 mg total) by mouth at bedtime as needed for sleep. (Patient not taking: Reported on 07/01/2016) 30 capsule  0   No current facility-administered medications on file prior to visit.    Past Medical History:  Diagnosis Date  . ADHD (attention deficit hyperactivity disorder)   . Anxiety   . Diabetes mellitus, type II ([place])   . Heart disease    Past Surgical History:  Procedure Laterality Date  . SPINE SURGERY    . THROAT SURGERY      NAD Vitals:   07/01/16 0917  BP: 120/77  Pulse: 70      Impression: The patient has pelvic floor dysfunction.  Recommendations: I went over the etiology of pelvic floor dysfunction which is associated with the patient's severe constipation, anxiety, and chronic back pain. He may also have some obstructive bladder  outlet component as well. The patient will continue [place] a lucent. We also discussed him starting pelvic floor physical therapy. Further, given the patient about cleanout regimen using mere lax as well as a daily bowel routine. We'll give the patient 6 months to complete pelvic floor physical therapy get his bowels under control, we will then follow up with him to see if his symptoms have improved. If not, the patient will certainly need urodynamics.

## 2016-07-01 NOTE — Addendum Note (Signed)
Addended by: Ardis Hughs on: 07/01/2016 01:27 PM   Modules accepted: Orders

## 2016-07-02 ENCOUNTER — Other Ambulatory Visit: Payer: Self-pay | Admitting: Family Medicine

## 2016-07-02 ENCOUNTER — Ambulatory Visit: Payer: Commercial Managed Care - HMO

## 2016-07-02 ENCOUNTER — Ambulatory Visit
Admission: RE | Admit: 2016-07-02 | Discharge: 2016-07-02 | Disposition: A | Discharge status: Home / Self Care | Payer: Commercial Managed Care - HMO | Source: Ambulatory Visit | Attending: Student | Admitting: Student

## 2016-07-02 DIAGNOSIS — K760 Fatty (change of) liver, not elsewhere classified: Secondary | ICD-10-CM | POA: Diagnosis not present

## 2016-07-05 ENCOUNTER — Ambulatory Visit: Payer: Commercial Managed Care - HMO | Admitting: Psychiatry

## 2016-07-15 ENCOUNTER — Other Ambulatory Visit: Payer: Self-pay | Admitting: Psychiatry

## 2016-07-15 ENCOUNTER — Telehealth: Payer: Self-pay

## 2016-07-15 NOTE — Telephone Encounter (Signed)
pt called stated that he needed a refill on his ambien and temazepam.   90 day supply to [facility]

## 2016-07-15 NOTE — Telephone Encounter (Signed)
He is not on [place]. He need to make an appointment for medication refill.

## 2016-07-15 NOTE — Telephone Encounter (Signed)
pt was called and told that he would need to discuss on his appt on 07-17-16.  Pt was told that per dr. Gretel Acre she does not have patient down as taking ambien.  Pt states he will look for his mediation bottle.

## 2016-07-17 ENCOUNTER — Ambulatory Visit (INDEPENDENT_AMBULATORY_CARE_PROVIDER_SITE_OTHER): Payer: Commercial Managed Care - HMO | Admitting: Family Medicine

## 2016-07-17 ENCOUNTER — Encounter: Payer: Self-pay | Admitting: Family Medicine

## 2016-07-17 ENCOUNTER — Ambulatory Visit: Payer: 59 | Admitting: Psychiatry

## 2016-07-17 VITALS — BP 127/72 | HR 67 | Temp 99.2°F | Resp 16 | Ht 72.0 in | Wt 246.2 lb

## 2016-07-17 DIAGNOSIS — M961 Postlaminectomy syndrome, not elsewhere classified: Secondary | ICD-10-CM | POA: Diagnosis not present

## 2016-07-17 DIAGNOSIS — I2510 Atherosclerotic heart disease of native coronary artery without angina pectoris: Secondary | ICD-10-CM

## 2016-07-17 DIAGNOSIS — Z9689 Presence of other specified functional implants: Secondary | ICD-10-CM

## 2016-07-17 DIAGNOSIS — F419 Anxiety disorder, unspecified: Secondary | ICD-10-CM | POA: Diagnosis not present

## 2016-07-17 DIAGNOSIS — G4700 Insomnia, unspecified: Secondary | ICD-10-CM | POA: Diagnosis not present

## 2016-07-17 DIAGNOSIS — K760 Fatty (change of) liver, not elsewhere classified: Secondary | ICD-10-CM | POA: Diagnosis not present

## 2016-07-17 DIAGNOSIS — G894 Chronic pain syndrome: Secondary | ICD-10-CM | POA: Diagnosis not present

## 2016-07-17 DIAGNOSIS — I10 Essential (primary) hypertension: Secondary | ICD-10-CM

## 2016-07-17 DIAGNOSIS — E291 Testicular hypofunction: Secondary | ICD-10-CM

## 2016-07-17 DIAGNOSIS — E1140 Type 2 diabetes mellitus with diabetic neuropathy, unspecified: Secondary | ICD-10-CM

## 2016-07-17 DIAGNOSIS — Z978 Presence of other specified devices: Secondary | ICD-10-CM

## 2016-07-17 DIAGNOSIS — F329 Major depressive disorder, single episode, unspecified: Secondary | ICD-10-CM | POA: Diagnosis not present

## 2016-07-17 DIAGNOSIS — R7989 Other specified abnormal findings of blood chemistry: Secondary | ICD-10-CM

## 2016-07-17 DIAGNOSIS — F32A Depression, unspecified: Secondary | ICD-10-CM

## 2016-07-17 MED ORDER — ZOLPIDEM TARTRATE 10 MG PO TABS: 10.0000 mg | ORAL_TABLET | Freq: Every day | ORAL | 0 refills | Status: DC

## 2016-07-17 NOTE — Assessment & Plan Note (Signed)
Controlled today. Continue current anti-HTN regimen

## 2016-07-17 NOTE — Assessment & Plan Note (Signed)
Currently stable. Followed by [facility] Dr Gretel Acre, on Wellbutrin. Previously failed other medications. Now titrated off Paxil. Self discontinued Seroquel. Problems with insomnia persisting, treated previously with Temazepam and Ambien

## 2016-07-17 NOTE — Assessment & Plan Note (Addendum)
Chronic problem, persistent back pain and lower extremity pain / neuropathy. Followed by [facility]. Currently with intrathecal dilaudid pump, Butrans patch weekly (most improvement in pain), Tramadol, Mobic, Baclofen. Discussed future plans with regards to following up with prior Pain Management physician that treats pumps and re-evaluation of removing current pump (anticipate that pump expires in 2017).

## 2016-07-17 NOTE — Assessment & Plan Note (Addendum)
Followed by [facility]. Continue lifestyle modifications and diet. S/p Twinrix vaccine. Recent abdominal US 06/2016 reported normal. Due for Hep C routine screening.

## 2016-07-17 NOTE — Progress Notes (Signed)
Subjective:    Patient ID: Dale [name], male    DOB: 1961/10/17, 55 y.o.   MRN: [id number]  Dale [name] is a 55 y.o. male presenting on 07/17/2016 for Establish Care (pt has some concerned abdominal pain rediate to groin area Right side)  HPI   Chronic Pain, Postlaminectomy syndrome Thoracic/Lumbar: - Reports chronic history of back pain since year 2001, initial diagnosis with ruptured/herniated disc in thoracic region, treated with multiple back surgery, and complicated course, see Pain Management and Neurology notes for details. Ultimately he has developed neuropathy symptoms into his lower extremities, with bilateral foot numbness. Additionally with back surgery, he has been treated with spinal cord stimulator, since removed, and more recently analgesic pump initially placed in abdomen eventually resulted in infection and failure, s/p revision and removal. S/p intrathecal pump with hydromorphone, reportedly due to expire in 2017 and he is working on coordinating removal. - Currently followed by [facility] (prior physician Dr Christoper Allegra who treated him with pain pump, transferred to to [facility]), he is under pain contract with [facility], and treated with Butrans patch weekly, Tramadol, Mobic, Baclofen. He also takes PRN Tylenol and sometimes Aleve/Ibuprofen. He believes the pain patch is primary relief from pain. - He has concerns also discussed with other providers, most recently GI in 06/2016 that he has been having persistent problems with low back pain, nerve pain radiating into legs and foot numbness. He is no longer established with Neurosurgery, and has not had back MRI in years. Currently followed by [facility] (Dr Manuella Ghazi) but not addressing prior back pain / surgeries.  Major Depression / Anxiety: - Reports currently mood / anxiety stable, some recent increase in stress recently due to telephone problem. Followed by Dr Gretel Acre [facility]),  last seen 05/23/16, at that time tapered off Paxil. Still reports problems with sleep. Previously treated with Ambien with some relief, last rx 05/2016. Trial on Seroquel 100mg  nightly, however patient says he has discontinued this due to side effects of vivid dreams and dry mouth. Also given Temazepam 15mg  at night for sleep, for 1 month supply, and is now out of this. He is requesting refill of Temazepam, plans to follow-up next month with Dr Gretel Acre. - Taking Wellbutrin-XL 24 hr 150mg  daily for depression, Xanax 0.5mg  BID daily, Hydroxyzine 25mg  BID for anxiety - prescribed by Psychiatry  Abdominal Pain, Chronic / OIC / GERD / NAFLD - Reports complaints of RLQ abdominal pain, located over his prior incision and area of prior surgeries with prior failed pump, resulted in infection and required multiple revision surgeries, including wound vac. Recently established with [facility] for this problem, along with others including opioid induced constipation, GERD. He is improving on Movantik for constipation, now with regular bowel movements. GERD controlled. Now still has concern of abdominal pain without any worsening symptoms. - Next colonoscopy due in 2022  CHRONIC DM, Type 2: Reports last A1c 7.4 in 01/2016. Due for next DM follow-up. Has apt tomorrow with me for physical. Taking Metformin without problem.  CHRONIC HTN: Reports checks BP at home with stable readings Current Meds - Amlodipine 5mg , HCTZ 25mg  daily   Reports good compliance, took meds today. Tolerating well, w/o complaints. Denies CP, dyspnea, HA, edema, dizziness / lightheadedness  HYPERLIPIDEMIA / CAD, non occlusive: - Reports no concerns. Last lipid panel 06/2015, well controlled except abnormal low HDL 30. Plans to arrive for fasting today for lipid panel check - Currently taking Atorvastatin 10mg  daily,  tolerating well without side effects or myalgias - Limited exercise due to chronic pain and low back, leg pains - Followed by [facility], history with known non-occlusive CAD per last cardiac cath 2002, some mild diffuse stenosis, last nuclear stress test in 2015 with no evidence of myocardial ischemia, had ECHO with normal LVEF 55% and mild mitral mod tricuspid insufficiency - Continues to take ASA 81mg  daily, statin  Pelvic Floor Dysfunction / Low Testosterone - Last seen by [place] in 06/2016, thought urinary symptoms likely multifactorial and related to pelvic floor dysfunction, he was referred and plans to see Pelvic Floor PT in 09/2016. Also, reported that he was told his prostate is fine, previous DRE reported to be +1 size and smooth. Tried Flomax for 2 months. Stopped 1 week ago, due to too much increased urine output and nocturia, improved off Flomax. Also repor - History of Low T, treated previously with injection, out for 1 year now, and would like to resume treatment for low T, recent labs checked 06/2016 demonstrating low testosterone (78.8, and free 1.7)  Past Medical History:  Diagnosis Date  . ADHD (attention deficit hyperactivity disorder)   . Anxiety   . Diabetes mellitus, type II ([place])   . Heart disease    Social History   Social History  . Marital status: Married    Spouse name: N/A  . Number of children: N/A  . Years of education: N/A   Occupational History  . Not on file.   Social History Main Topics  . Smoking status: Former Smoker    Types: Cigarettes    Start date: 08/29/1979    Quit date: 11/04/2010  . Smokeless tobacco: Former Systems developer    Quit date: 11/19/2010  . Alcohol use No  . Drug use: No  . Sexual activity: Not Currently   Other Topics Concern  . Not on file   Social History Narrative  . No narrative on file   Family History  Problem Relation Age of Onset  . Depression Mother   . Anxiety disorder Mother   . Diabetes Mother   . Heart failure Mother   . Heart attack Father   . Diabetes Sister   . Obesity Sister   . Drug abuse Brother   . Alcohol  abuse Brother   . Depression Sister   . Anxiety disorder Sister   . Drug abuse Sister   . Anxiety disorder Sister   . Seizures Sister   . Diabetes Sister   . Depression Sister    Current Outpatient Prescriptions on File Prior to Visit  Medication Sig  . ALPRAZolam (XANAX) 0.5 MG tablet Take 1 tablet (0.5 mg total) by mouth 3 (three) times daily.  Marland Kitchen amLODipine (NORVASC) 5 MG tablet Take 1 tablet by mouth daily.  Marland Kitchen aspirin 81 MG tablet Take 1 tablet (81 mg total) by mouth daily.  Marland Kitchen atorvastatin (LIPITOR) 10 MG tablet TAKE 1 TABLET BY MOUTH  DAILY AT 2 PM.  . baclofen (LIORESAL) 10 MG tablet Take 10 mg by mouth 4 (four) times daily.  Marland Kitchen buPROPion (WELLBUTRIN XL) 150 MG 24 hr tablet Take 1 tablet (150 mg total) by mouth daily.  . cyanocobalamin 1000 MCG tablet Take 100 mcg by mouth daily.  Marland Kitchen desonide (DESOWEN) 0.05 % cream Apply topically 2 (two) times daily as needed.  . desoximetasone (TOPICORT) 0.25 % cream Apply 1 application topically 2 (two) times daily as needed.  Marland Kitchen glucose blood test strip E11.9  .  hydrochlorothiazide (HYDRODIURIL) 25 MG tablet Take 1 tablet (25 mg total) by mouth daily.  . hydrOXYzine (VISTARIL) 25 MG capsule Take 1 capsule (25 mg total) by mouth 2 (two) times daily as needed.  . latanoprost (XALATAN) 0.005 % ophthalmic solution Place 1 drop into both eyes at bedtime.  . meloxicam (MOBIC) 7.5 MG tablet Take 7.5 mg by mouth 2 (two) times daily.  . metFORMIN (GLUCOPHAGE) 1000 MG tablet Take 1 tablet (1,000 mg total) by mouth 2 (two) times daily with a meal.  . Multiple Vitamin (MULTIVITAMIN) tablet Take 1 tablet by mouth daily.  . ondansetron (ZOFRAN) 4 MG tablet Take 1 tablet (4 mg total) by mouth every 8 (eight) hours as needed for nausea or vomiting.  . traMADol (ULTRAM) 50 MG tablet Take 2 tablets by mouth 4 (four) times daily as needed.  Marland Kitchen QUEtiapine (SEROQUEL) 100 MG tablet Take 1 tablet (100 mg total) by mouth at bedtime. (Patient not taking: Reported on  07/17/2016)  . tamsulosin (FLOMAX) 0.4 MG CAPS capsule Take 1 capsule (0.4 mg total) by mouth daily. (Patient not taking: Reported on 07/17/2016)  . temazepam (RESTORIL) 15 MG capsule Take 1 capsule (15 mg total) by mouth at bedtime as needed for sleep. (Patient not taking: Reported on 07/17/2016)   No current facility-administered medications on file prior to visit.     Review of Systems  Constitutional: Negative for activity change, appetite change, chills, diaphoresis, fatigue, fever and unexpected weight change.  HENT: Negative for congestion, hearing loss, rhinorrhea, sinus pressure, sneezing, trouble swallowing and voice change.   Eyes: Negative for visual disturbance.  Respiratory: Negative for cough, chest tightness, shortness of breath and wheezing.   Cardiovascular: Negative for chest pain, palpitations and leg swelling.  Gastrointestinal: Positive for abdominal pain (right lower quad, location of prior surgical scar) and constipation (Significantly improved now on Movantik). Negative for diarrhea, nausea and vomiting.  Endocrine: Negative for cold intolerance, polydipsia and polyuria.  Genitourinary: Negative for dysuria, frequency, hematuria and urgency.       Nocturia on Flomax (now discontinued)  Musculoskeletal: Positive for back pain (chronic). Negative for arthralgias, joint swelling and neck pain.  Skin: Negative for rash.  Allergic/Immunologic: Negative for environmental allergies.  Neurological: Negative for dizziness, weakness, light-headedness, numbness and headaches.  Hematological: Negative for adenopathy.  Psychiatric/Behavioral: Positive for sleep disturbance. Negative for behavioral problems, confusion and dysphoric mood. The patient is not nervous/anxious.    Per HPI unless specifically indicated above     Objective:    BP 127/72   Pulse 67   Temp 99.2 F (37.3 C) (Oral)   Resp 16   Ht 6' (1.829 m)   Wt 246 lb 3.2 oz (111.7 kg)   BMI 33.39 kg/m   Wt  Readings from Last 3 Encounters:  07/17/16 246 lb 3.2 oz (111.7 kg)  07/01/16 250 lb 3.2 oz (113.5 kg)  06/17/16 244 lb (110.7 kg)    Physical Exam  Constitutional: He is oriented to person, place, and time. He appears well-developed and well-nourished. No distress.  Well-appearing, tired, mostly comfortable, some back discomfort with stiffness, cooperative  HENT:  Head: Normocephalic and atraumatic.  Mouth/Throat: Oropharynx is clear and moist.  Eyes: Conjunctivae and EOM are normal. Pupils are equal, round, and reactive to light.  Neck: Normal range of motion. Neck supple. No thyromegaly present.  Cardiovascular: Normal rate, regular rhythm, normal heart sounds and intact distal pulses.   No murmur heard. Pulmonary/Chest: Effort normal and breath sounds normal. No respiratory  distress. He has no wheezes. He has no rales.  Abdominal: Soft. He exhibits no distension.  Old surgical incision scar across RLQ, location of tenderness  Musculoskeletal: He exhibits no edema.  Low Back No obvious deformity. Some bilateral mid to lower muscle hypertonicity. Reduced active ROM with forward flex / back extension with some stiffness on ambulation.  Lymphadenopathy:    He has no cervical adenopathy.  Neurological: He is alert and oriented to person, place, and time.  Gait normal without assistance or device.  Distal sensation in upper extremity intact to light touch.  Reduced sensation to light touch bilateral lower extremity and feet.  Skin: Skin is warm and dry. No rash noted. He is not diaphoretic.  Scattered dry eczema patches on face and extremities  Psychiatric: He has a normal mood and affect. His behavior is normal. Thought content normal.  Nursing note and vitals reviewed.      Assessment & Plan:   Problem List Items Addressed This Visit    Type 2 diabetes, controlled, with neuropathy ([place]) - Primary    Previously controlled. Last A1c 7.4 in 01/2016. Due for A1c, will obtain  tomorrow with physical / DM follow-up. Also will be due for DM Foot exam, annual DM eye exam, Flu/PNA vaccines, and lipid panel. Continue Metformin      Presence of intrathecal pump    Reportedly pump to expire 2017, patient making arrangements to follow-up with prior pump doctor to plan removal.      Low testosterone    Recent labs done 06/2016 with low T 78.8 and free T 1.7, unsure time of lab draw. Primary symptom of fatigue. Will repeat testosterone lab AM draw tomorrow for physical, for 2nd lab value. Patient already established with [facility] urology, will advise to follow-up with them to discuss testosterone lab results and management of testosterone replacement, has been off inj >1 year      Insomnia    Chronic problem. Previously treated by [facility] Dr Gretel Acre with trial of Seroquel, self discontinued due to intolerance, also treated with Temazepam. Previously Ambien last 05/2016. - Checked [place] CSRS reviewed prior controlled rx - Advised patient that I would not provide long-term rx for BDZ (such as Temazepam) or Ambien. Given he is out and awaiting follow-up with Behavioral Health, I can authorize a 1 month supply of Ambien, but will not continue this. Counseled on risks of this medication with his current opiate pain medicine therapy      Relevant Medications   zolpidem (AMBIEN) 10 MG tablet   Fatty infiltration of liver    Followed by [facility]. Continue lifestyle modifications and diet. S/p Twinrix vaccine. Recent abdominal US 06/2016 reported normal. Due for Hep C routine screening.      Failed back syndrome of lumbar spine    Concern with persistent back pain and lower extremity pain with foot numbness. Advised patient to discuss with current Neurologist to see if referral can be made to re-establish with Neurosurgery. Return criteria given if worsening symptoms, or acute changes.      Essential (primary) hypertension    Controlled today. Continue current anti-HTN  regimen      Depression    Currently stable. Followed by [facility] Dr Gretel Acre, on Wellbutrin. Previously failed other medications. Now titrated off Paxil. Self discontinued Seroquel. Problems with insomnia persisting, treated previously with Temazepam and Ambien      Chronic pain associated with significant psychosocial dysfunction    Chronic problem, persistent back pain and  lower extremity pain / neuropathy. Followed by [facility]. Currently with intrathecal dilaudid pump, Butrans patch weekly (most improvement in pain), Tramadol, Mobic, Baclofen. Discussed future plans with regards to following up with prior Pain Management physician that treats pumps and re-evaluation of removing current pump (anticipate that pump expires in 2017).      CAD (coronary artery disease), native coronary artery    Stable, without angina Continue to follow [facility], med management ASA 81, mod intensity statin atorv 10, HTN meds      Anxiety    Chronic problem, associated with depression. Followed by Dr Gretel Acre, Behavioral Health. Currently treated with Alprazolam 0.5mg  TID, Hydroxyzine 25mg  BID, also some Temazepam / Ambien for sleep.       Other Visit Diagnoses   None.     Meds ordered this encounter  Medications  . naloxegol oxalate (MOVANTIK) 25 MG TABS tablet    Sig: Take by mouth.  . DISCONTD: MOVANTIK 25 MG TABS tablet  . zolpidem (AMBIEN) 10 MG tablet    Sig: Take 1 tablet (10 mg total) by mouth at bedtime.    Dispense:  30 tablet    Refill:  0      Follow up plan: Return in about 1 day (around 07/18/2016) for physical.  [name], DO [facility] 07/17/2016, 4:09 PM

## 2016-07-17 NOTE — Patient Instructions (Signed)
Thank you for coming in to clinic today.  1. Refilled Ambien today for 30 pills, 30 day supply - as discussed this is a one time prescription for me to allow you to return to Dr Gretel Acre with Behavioral health to discuss your sleeping medications.  2. Referrals - none ordered today, but please coordinate the following, and let us know if you need a referral, and please provide specifics doctor / clinic - Neurosurgery > please speak with Dr Manuella Ghazi about possibility of referring you to new neurosurgeon to evaluate your back, may need MRI and further nerve conduction testing to determine if you need any surgical intervention if there is a spine problem  - General Surgery > Dr Christoper Allegra ([place]) please contact his office to discuss your history and plans to return to him for intrathecal pump removal / replacement or evaluation, and also to address your prior history of chronic abdominal pain from scar tissue as this may need CT imaging, and evaluation by General Surgeon - You will need to discuss Pain Management plans with both [facility] and [facility], to determine the best way to proceed, please make sure that you follow the appropriate channels, because as discussed, we do not prescribe any of the controlled substance opiate medications including the patches for long-term pain here  I will plan on seeing you tomorrow at 9:00am for physical, arrive fasting (no food or drink with calories, water is ok) after midnight tonight, we will check basic labs, cholesterol, A1c.  If you have any other questions or concerns, please feel free to call the clinic or send a message through [place]. You may also schedule an earlier appointment if necessary.  [name], DO [place]

## 2016-07-17 NOTE — Assessment & Plan Note (Signed)
Stable, without angina Continue to follow [facility], med management ASA 81, mod intensity statin atorv 10, HTN meds

## 2016-07-17 NOTE — Assessment & Plan Note (Signed)
Recent labs done 06/2016 with low T 78.8 and free T 1.7, unsure time of lab draw. Primary symptom of fatigue. Will repeat testosterone lab AM draw tomorrow for physical, for 2nd lab value. Patient already established with [facility] urology, will advise to follow-up with them to discuss testosterone lab results and management of testosterone replacement, has been off inj >1 year

## 2016-07-17 NOTE — Assessment & Plan Note (Addendum)
Chronic problem. Previously treated by [facility] Dr Gretel Acre with trial of Seroquel, self discontinued due to intolerance, also treated with Temazepam. Previously Ambien last 05/2016. - Checked [place] CSRS reviewed prior controlled rx - Advised patient that I would not provide long-term rx for BDZ (such as Temazepam) or Ambien. Given he is out and awaiting follow-up with Behavioral Health, I can authorize a 1 month supply of Ambien, but will not continue this. Counseled on risks of this medication with his current opiate pain medicine therapy

## 2016-07-17 NOTE — Assessment & Plan Note (Signed)
Chronic problem, associated with depression. Followed by Dr Gretel Acre, Behavioral Health. Currently treated with Alprazolam 0.5mg  TID, Hydroxyzine 25mg  BID, also some Temazepam / Ambien for sleep.

## 2016-07-17 NOTE — Assessment & Plan Note (Signed)
Reportedly pump to expire 2017, patient making arrangements to follow-up with prior pump doctor to plan removal.

## 2016-07-17 NOTE — Assessment & Plan Note (Addendum)
Previously controlled. Last A1c 7.4 in 01/2016. Due for A1c, will obtain tomorrow with physical / DM follow-up. Also will be due for DM Foot exam, annual DM eye exam, Flu/PNA vaccines, and lipid panel. Continue Metformin

## 2016-07-17 NOTE — Telephone Encounter (Signed)
noted 

## 2016-07-17 NOTE — Assessment & Plan Note (Signed)
Concern with persistent back pain and lower extremity pain with foot numbness. Advised patient to discuss with current Neurologist to see if referral can be made to re-establish with Neurosurgery. Return criteria given if worsening symptoms, or acute changes.

## 2016-07-18 ENCOUNTER — Encounter: Payer: Self-pay | Admitting: Family Medicine

## 2016-07-18 ENCOUNTER — Ambulatory Visit (INDEPENDENT_AMBULATORY_CARE_PROVIDER_SITE_OTHER): Payer: Commercial Managed Care - HMO | Admitting: Family Medicine

## 2016-07-18 ENCOUNTER — Ambulatory Visit: Payer: 59 | Admitting: Psychiatry

## 2016-07-18 ENCOUNTER — Encounter (INDEPENDENT_AMBULATORY_CARE_PROVIDER_SITE_OTHER): Payer: Self-pay

## 2016-07-18 VITALS — BP 118/69 | HR 64 | Temp 98.2°F | Resp 16 | Ht 72.0 in | Wt 246.6 lb

## 2016-07-18 DIAGNOSIS — Z114 Encounter for screening for human immunodeficiency virus [HIV]: Secondary | ICD-10-CM | POA: Diagnosis not present

## 2016-07-18 DIAGNOSIS — Z0000 Encounter for general adult medical examination without abnormal findings: Secondary | ICD-10-CM

## 2016-07-18 DIAGNOSIS — G894 Chronic pain syndrome: Secondary | ICD-10-CM

## 2016-07-18 DIAGNOSIS — E1140 Type 2 diabetes mellitus with diabetic neuropathy, unspecified: Secondary | ICD-10-CM

## 2016-07-18 DIAGNOSIS — E785 Hyperlipidemia, unspecified: Secondary | ICD-10-CM

## 2016-07-18 DIAGNOSIS — R7989 Other specified abnormal findings of blood chemistry: Secondary | ICD-10-CM

## 2016-07-18 DIAGNOSIS — Z23 Encounter for immunization: Secondary | ICD-10-CM

## 2016-07-18 DIAGNOSIS — Z125 Encounter for screening for malignant neoplasm of prostate: Secondary | ICD-10-CM | POA: Diagnosis not present

## 2016-07-18 DIAGNOSIS — E291 Testicular hypofunction: Secondary | ICD-10-CM | POA: Diagnosis not present

## 2016-07-18 DIAGNOSIS — E669 Obesity, unspecified: Secondary | ICD-10-CM | POA: Diagnosis not present

## 2016-07-18 DIAGNOSIS — Z1159 Encounter for screening for other viral diseases: Secondary | ICD-10-CM

## 2016-07-18 DIAGNOSIS — I10 Essential (primary) hypertension: Secondary | ICD-10-CM

## 2016-07-18 DIAGNOSIS — E66811 Obesity, class 1: Secondary | ICD-10-CM

## 2016-07-18 MED ORDER — GLUCOSE BLOOD VI STRP: ORAL_STRIP | 5 refills | Status: DC

## 2016-07-18 MED ORDER — LISINOPRIL 5 MG PO TABS: 5.0000 mg | ORAL_TABLET | Freq: Every day | ORAL | 3 refills | Status: DC

## 2016-07-18 NOTE — Assessment & Plan Note (Signed)
BMI >33, risk factors with DM, HTN. Limited exercise by chronic pain and prior back surgeries. Continue improved DM diet, and exercise as tolerated Check lipids

## 2016-07-18 NOTE — Assessment & Plan Note (Signed)
Previously controlled on Lipitor 10mg  (mod intensity) Check fasting lipid panel today

## 2016-07-18 NOTE — Assessment & Plan Note (Signed)
Currently stable based on CBGs reported. 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 High risk for foot complications given significant lack of monofilament sensation bilaterally.  Plan: 1. Check A1c, Lipids 2. Continue Metformin 1000mg  - Re-order OneTouch Verio test strips 3. Start low dose ACEi - Lisinopril 5mg  daily for renal protection. Did not check urine microalbumin (recent urine testing with negative protein) - check BMET for f/u K, Cr in 2-3 weeks. 4. DM Foot exam done today, severely reduced monofilament sensation. Continue to closely monitor feet, given precautions. Follow with neurology 5. Given pneumovax-23 today, future due vaccine >65 6. Declined Flu shot today, counseling given 7. Advised patient to schedule annual DM eye exam 8. Continue ASA, statin 9. Follow-up 3 months, A1c

## 2016-07-18 NOTE — Patient Instructions (Signed)
Thank you for coming in to clinic today.  Physical done today. Including labs - A1c (3 month diabetes test), last 7.4 in March 2017. Testosterone (last was low 78), routine HIv and Hep C screen, cholesterol panel and PSA prostate cancer screening.  Pneumovax-23 shot today, no more pneumonia vaccine until >age 56  If you change mind about Flu shot let us know.  Start Lisinopril 5mg  daily for diabetic kidney protection. If you develop persistent dry cough let us know we can stop it and try alternative medication. Rarely does this cause other problems with allergies and swelling of face, stop taking and let us know if you develop any allergy.  Ordered OneTouch strips, let us know if any problems, we may need to reduce checking to 1-2x daily  I think that you may benefit from a consult with a General Surgeon to discuss your old abdominal incisional scar may have developed small herniation or scar tissue problem. Also they can evaluate your Right inguinal groin for possible early hernia. I do not appreciate a large herniation at this time, this is something to watch and if worsening or you develop a distinct bulging or not improving pain, follow-up sooner.  Please schedule a follow-up appointment with Dr. Parks Ranger in 3 months for Diabetes follow-up  If you have any other questions or concerns, please feel free to call the clinic or send a message through [place]. You may also schedule an earlier appointment if necessary.  [name], DO [place]

## 2016-07-18 NOTE — Assessment & Plan Note (Signed)
History of Low T on testosterone injections, off >1 yr. Last low 06/2016. Re-check AM fasting testosterone today. If 2nd confirmed low T, advise patient to follow-up with already established Urology to discuss testosterone replacement therapy

## 2016-07-18 NOTE — Assessment & Plan Note (Signed)
Stable, chronic problem. Continue to follow-up [facility], await update from patient regarding discussion of future management intrathecal pump, planned to discuss with surgeon at [facility]

## 2016-07-18 NOTE — Progress Notes (Signed)
Subjective:    Patient ID: Dale [name], male    DOB: [birth year], 55 y.o.   MRN: [id number]  Dale [name] is a 55 y.o. male presenting on 07/18/2016 for Annual Physical Exam, stated needs Biometric physical for insurance.  HPI   CHRONIC DM, Type 2: Reports no concerns today. Request refill on OneTouch Verio test strips, 90 day supply, has new meter. CBGs: Avg 80-100, Low 77 (asymptomatic), High < 200. Checks CBGs 2-3x daily, fasting, 2 hr after eating Meds: Metformin 1000mg  BID Reports good compliance. Tolerating well w/o side-effects Never been on ACE / ARB Lifestyle: Diet (drinks mostly water, no sugary drinks, no regular sugar foods) / Exercise (limited, tries to do as much as can, with walking most days) - Family history with Mother diabetes, complicated. Admits bilateral foot and lower ext numbness, tingling with some pains, no formal dx DM neuropathy, symptoms started >6 years prior to dx diabetes, related to back injury. Denies hypoglycemia  CHRONIC HTN: Reports no concerns today. Current Meds - Amlodipine 5mg , HCTZ 25mg  daily. Never been on ACEi/ARB.  Reports good compliance, took meds today. Tolerating well, w/o complaints.  HYPERLIPIDEMIA / CAD, non occlusive: - Fasting today due for Lipid panel. Previously controlled on Atorvastatin, tolerating well without myalgias. - Continues to take ASA 81mg  daily  RIGHT LOWER ABDOMINAL PAIN / RIGHT INGUINAL PAIN: - Reports symptoms of persistent Right lower to mid abdominal pain, intermittently over several months. Initially problem with some post-op pain following the pain pump surgery required multiple revisions secondary to infection. Recently seen [facility] in 06/2016 for same complaint, thought to be chronic abdominal wall scar pain, however does not seem to be significantly improving with NSAIDs chronic pain meds and topical heat. - Additionally reports problem of Right inguinal groin pain radiating into Right testicle. This  is a gradual onset problem with intermittent mild to moderate severity aching pain, worse with heavy lifting or strenuous activity, quickly resolves, denies any actual bulging or herniation that he is aware of. No prior hernia or repair. Admits to feeling his Right testicle is higher at times.  LOW TESTOSTERONE History of Low T, treated previously with injection, out for 1 year now, and would like to resume treatment for low T, recent labs checked 06/2016 demonstrating low testosterone (78.8, and free 1.7). Today requesting repeat AM lab for testosterone. He already has Urology, and will follow-up with them for treatment if indicated.  Health Maintenance: - Declines Flu shot, counseling given on benefits / risks, and recommended for DM patients - Due for Pneumovax-23 vaccine today, given DM - Colonoscopy, last 2011, due in 2021. Family history with sister having polyps (results were benign) - No family history of prostate cancer. No prior prostate screening. Interested in PSA testing only, declines DRE - Due routine screening HIV, Hep C   Past Medical History:  Diagnosis Date  . ADHD (attention deficit hyperactivity disorder)   . Anxiety   . Diabetes mellitus, type II ([place])   . Heart disease    Social History   Social History  . Marital status: Married    Spouse name: N/A  . Number of children: N/A  . Years of education: N/A   Occupational History  . Not on file.   Social History Main Topics  . Smoking status: Former Smoker    Types: Cigarettes    Start date: 08/29/1979    Quit date: 11/04/2010  . Smokeless tobacco: Former Systems developer    Quit date: 11/19/2010  .  Alcohol use No  . Drug use: No  . Sexual activity: Not Currently   Other Topics Concern  . Not on file   Social History Narrative  . No narrative on file   Family History  Problem Relation Age of Onset  . Depression Mother   . Anxiety disorder Mother   . Diabetes Mother   . Heart failure Mother   . Heart attack  Father   . Diabetes Sister   . Obesity Sister   . Drug abuse Brother   . Alcohol abuse Brother   . Depression Sister   . Anxiety disorder Sister   . Drug abuse Sister   . Anxiety disorder Sister   . Seizures Sister   . Diabetes Sister   . Depression Sister    Current Outpatient Prescriptions on File Prior to Visit  Medication Sig  . ALPRAZolam (XANAX) 0.5 MG tablet Take 1 tablet (0.5 mg total) by mouth 3 (three) times daily.  Marland Kitchen amLODipine (NORVASC) 5 MG tablet Take 1 tablet by mouth daily.  Marland Kitchen aspirin 81 MG tablet Take 1 tablet (81 mg total) by mouth daily.  Marland Kitchen atorvastatin (LIPITOR) 10 MG tablet TAKE 1 TABLET BY MOUTH  DAILY AT 2 PM.  . baclofen (LIORESAL) 10 MG tablet Take 10 mg by mouth 4 (four) times daily.  Marland Kitchen buPROPion (WELLBUTRIN XL) 150 MG 24 hr tablet Take 1 tablet (150 mg total) by mouth daily.  . cyanocobalamin 1000 MCG tablet Take 100 mcg by mouth daily.  Marland Kitchen desonide (DESOWEN) 0.05 % cream Apply topically 2 (two) times daily as needed.  . desoximetasone (TOPICORT) 0.25 % cream Apply 1 application topically 2 (two) times daily as needed.  . hydrochlorothiazide (HYDRODIURIL) 25 MG tablet Take 1 tablet (25 mg total) by mouth daily.  . hydrOXYzine (VISTARIL) 25 MG capsule Take 1 capsule (25 mg total) by mouth 2 (two) times daily as needed.  . latanoprost (XALATAN) 0.005 % ophthalmic solution Place 1 drop into both eyes at bedtime.  . meloxicam (MOBIC) 7.5 MG tablet Take 7.5 mg by mouth 2 (two) times daily.  . metFORMIN (GLUCOPHAGE) 1000 MG tablet Take 1 tablet (1,000 mg total) by mouth 2 (two) times daily with a meal.  . Multiple Vitamin (MULTIVITAMIN) tablet Take 1 tablet by mouth daily.  . naloxegol oxalate (MOVANTIK) 25 MG TABS tablet Take by mouth.  . ondansetron (ZOFRAN) 4 MG tablet Take 1 tablet (4 mg total) by mouth every 8 (eight) hours as needed for nausea or vomiting.  Marland Kitchen QUEtiapine (SEROQUEL) 100 MG tablet Take 1 tablet (100 mg total) by mouth at bedtime.  . tamsulosin  (FLOMAX) 0.4 MG CAPS capsule Take 1 capsule (0.4 mg total) by mouth daily.  . temazepam (RESTORIL) 15 MG capsule Take 1 capsule (15 mg total) by mouth at bedtime as needed for sleep.  . traMADol (ULTRAM) 50 MG tablet Take 2 tablets by mouth 4 (four) times daily as needed.  . zolpidem (AMBIEN) 10 MG tablet Take 1 tablet (10 mg total) by mouth at bedtime.   No current facility-administered medications on file prior to visit.     Review of Systems  Constitutional: Negative for activity change, appetite change, chills, diaphoresis, fatigue, fever and unexpected weight change.  HENT: Negative for congestion, hearing loss, rhinorrhea, sinus pressure, sneezing, trouble swallowing and voice change.   Eyes: Negative for visual disturbance.  Respiratory: Negative for cough, chest tightness, shortness of breath and wheezing.   Cardiovascular: Negative for chest pain, palpitations and leg  swelling.  Gastrointestinal: Positive for abdominal pain (right lower quad, location of prior surgical scar). Negative for constipation (Significantly improved now on Movantik), diarrhea, nausea and vomiting.  Endocrine: Negative for cold intolerance, polydipsia and polyuria.  Genitourinary: Negative for dysuria, frequency, hematuria and urgency.  Musculoskeletal: Positive for back pain (chronic). Negative for arthralgias, joint swelling and neck pain.  Skin: Negative for rash.  Allergic/Immunologic: Negative for environmental allergies.  Neurological: Positive for numbness (bilateral feet and some reduced sensation lower extremities). Negative for dizziness, weakness, light-headedness and headaches.  Hematological: Negative for adenopathy.  Psychiatric/Behavioral: Positive for sleep disturbance. Negative for behavioral problems, confusion and dysphoric mood. The patient is not nervous/anxious.    Per HPI unless specifically indicated above     Objective:    BP 118/69   Pulse 64   Temp 98.2 F (36.8 C) (Oral)    Resp 16   Ht 6' (1.829 m)   Wt 246 lb 9.6 oz (111.9 kg)   BMI 33.44 kg/m   Wt Readings from Last 3 Encounters:  07/18/16 246 lb 9.6 oz (111.9 kg)  07/17/16 246 lb 3.2 oz (111.7 kg)  07/01/16 250 lb 3.2 oz (113.5 kg)    Physical Exam  Constitutional: He is oriented to person, place, and time. He appears well-developed and well-nourished. No distress.  Well-appearing, comfortable, stable back stiffness on mobility, cooperative  HENT:  Head: Normocephalic and atraumatic.  Mouth/Throat: Oropharynx is clear and moist.  Eyes: Conjunctivae and EOM are normal. Pupils are equal, round, and reactive to light.  Neck: Normal range of motion. Neck supple. No thyromegaly present.  Cardiovascular: Normal rate, regular rhythm, normal heart sounds and intact distal pulses.   No murmur heard. Pulmonary/Chest: Effort normal and breath sounds normal. No respiratory distress. He has no wheezes. He has no rales.  Abdominal: Soft. Bowel sounds are normal. He exhibits no distension and no mass. There is no tenderness.  Old surgical incision scar linear left to right over RLQ with some appearance of skin inversion at scar site. Palpable without tenderness but significant variable feel to soft tissue with some firmer scar tissue. Some fullness on abdominal straining but no obvious herniation.   Well healed smooth linear LLQ abdominal incisional scar.  Genitourinary:  Genitourinary Comments: Right inguinal area with slightly increased tissue bulge fullness compared to Left. Some mild tenderness on deep palpation over this Right inguinal area. No reproducible herniation on exam.  Scrotum appears normal, bilateral testes palpable non tender, no masses.  Musculoskeletal: He exhibits no edema.  Low Back Inspection: Some reduced lumbar lordosis with resting posture, no spinal deformity, symmetrical. Palpation: No tenderness over spinous processes. Bilateral lumbar paraspinal muscles non-tender, Left > Right with  hypertonicity/spasm. ROM: Significantly reduced Back extension due to stiffness, mostly full forward flexion, limited right rotation with stiffness Special Testing: Seated SLR negative for radicular pain bilaterally but reproduced some bilateral lower leg tightness and discomfort from knees to feet. Strength: Bilateral hip flex/ext 5/5, knee flex/ext 5/5, ankle dorsiflex/plantarflex 5/5   No obvious deformity. Some bilateral mid to lower muscle hypertonicity. Reduced active ROM with forward flex / back extension with some stiffness on ambulation.  Lymphadenopathy:    He has no cervical adenopathy.  Neurological: He is alert and oriented to person, place, and time. He exhibits normal muscle tone. Coordination normal.  Gait mostly normal with some antalgic pattern without assistance or device.  Distal sensation in upper extremity intact to light touch.  Bilateral feet without any light touch or monofilament  sensation, dorsal or plantar surfaces in stocking distribution. Some very mild preserved pressure sensation R>L heel and dorsal aspect near ankle.  Lower legs bilaterally with reduced sensation extending up above knees, intact pressure sensation.   Skin: Skin is warm and dry. No rash noted. He is not diaphoretic.  Distal extremities warm  Psychiatric: His behavior is normal. Thought content normal.  Slightly blunted affect  Nursing note and vitals reviewed.  DM FOOT EXAM - normal appearance, no lesions, calluses, or ulcers, mostly absent sensation to monofilament bilaterally plantar and dorsal in stocking distribution, some mild spared sensation to monofilament R heel and dorsal near ankle. Intact sensation to pressure.   Results for orders placed or performed in visit on 07/01/16  Microscopic Examination  Result Value Ref Range   WBC, UA 6-10 (A) 0 - 5 /hpf   RBC, UA None seen 0 - 2 /hpf   Epithelial Cells (non renal) 0-10 0 - 10 /hpf   Bacteria, UA Few None seen/Few  Urinalysis,  Complete  Result Value Ref Range   Specific Gravity, UA 1.020 1.005 - 1.030   pH, UA 6.5 5.0 - 7.5   Color, UA Yellow Yellow   Appearance Ur Clear Clear   Leukocytes, UA Trace (A) Negative   Protein, UA Negative Negative/Trace   Glucose, UA Negative Negative   Ketones, UA Negative Negative   RBC, UA Negative Negative   Bilirubin, UA Negative Negative   Urobilinogen, Ur 0.2 0.2 - 1.0 mg/dL   Nitrite, UA Negative Negative   Microscopic Examination See below:   Bladder Scan (Post Void Residual) in office  Result Value Ref Range   Scan Result 15       Assessment & Plan:   Problem List Items Addressed This Visit    Type 2 diabetes, controlled, with neuropathy ([place])    Currently stable based on CBGs reported. 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 High risk for foot complications given significant lack of monofilament sensation bilaterally.  Plan: 1. Check A1c, Lipids 2. Continue Metformin 1000mg  - Re-order OneTouch Verio test strips 3. Start low dose ACEi - Lisinopril 5mg  daily for renal protection. Did not check urine microalbumin (recent urine testing with negative protein) - check BMET for f/u K, Cr in 2-3 weeks. 4. DM Foot exam done today, severely reduced monofilament sensation. Continue to closely monitor feet, given precautions. Follow with neurology 5. Given pneumovax-23 today, future due vaccine >65 6. Declined Flu shot today, counseling given 7. Advised patient to schedule annual DM eye exam 8. Continue ASA, statin 9. Follow-up 3 months, A1c      Relevant Medications   lisinopril (PRINIVIL,ZESTRIL) 5 MG tablet   glucose blood (ONETOUCH VERIO) test strip   Other Relevant Orders   Hemoglobin A1c   Lipid panel   BASIC METABOLIC PANEL WITH GFR   Obesity (BMI 30.0-34.9)    BMI >33, risk factors with DM, HTN. Limited exercise by chronic pain and prior  back surgeries. Continue improved DM diet, and exercise as tolerated Check lipids      Low testosterone    History of Low T on testosterone injections, off >1 yr. Last low 06/2016. Re-check AM fasting testosterone today. If 2nd confirmed low T, advise patient to follow-up with already established Urology to discuss testosterone replacement therapy      Relevant Orders   Testosterone Total,Free,Bio, Males   HLD (hyperlipidemia)  Previously controlled on Lipitor 10mg  (mod intensity) Check fasting lipid panel today      Relevant Medications   lisinopril (PRINIVIL,ZESTRIL) 5 MG tablet   Other Relevant Orders   Lipid panel   Essential (primary) hypertension    Stable, controlled. Continue current regimen, except adding low dose Lisinopril 5mg  daily (for DM renal protection), monitor BP after new start ACEi. Will add future BMET for monitoring K within 2-3 weeks of starting ACEi.      Relevant Medications   lisinopril (PRINIVIL,ZESTRIL) 5 MG tablet   Other Relevant Orders   BASIC METABOLIC PANEL WITH GFR   Chronic pain associated with significant psychosocial dysfunction    Stable, chronic problem. Continue to follow-up [facility], await update from patient regarding discussion of future management intrathecal pump, planned to discuss with surgeon at [facility]       Other Visit Diagnoses    Annual physical exam    -  Primary   Need for pneumococcal vaccination       Relevant Orders   Pneumococcal polysaccharide vaccine 23-valent greater than or equal to 2yo subcutaneous/IM (Completed)   Need for hepatitis C screening test       Relevant Orders   Hepatitis C antibody   Encounter for screening for HIV       Relevant Orders   HIV antibody   Prostate cancer screening       No prior screening or family history. Declined DRE. Follow-up PSA, if abnormal will advise to follow-up with established Urology   Relevant Orders   PSA      Meds ordered this encounter  Medications   . lisinopril (PRINIVIL,ZESTRIL) 5 MG tablet    Sig: Take 1 tablet (5 mg total) by mouth daily.    Dispense:  90 tablet    Refill:  3  . glucose blood (ONETOUCH VERIO) test strip    Sig: Check blood sugar up to 3x daily.    Dispense:  100 each    Refill:  5    For use with One Touch Verio Meter      Follow up plan: Return in about 3 months (around 10/17/2016) for diabetes.  [name], DO [facility] 07/18/2016, 11:22 AM

## 2016-07-18 NOTE — Assessment & Plan Note (Signed)
Stable, controlled. Continue current regimen, except adding low dose Lisinopril 5mg  daily (for DM renal protection), monitor BP after new start ACEi. Will add future BMET for monitoring K within 2-3 weeks of starting ACEi.

## 2016-07-19 LAB — LIPID PANEL
CHOLESTEROL: 120 mg/dL — AB (ref 125–200)
HDL: 33 mg/dL — AB (ref >=40)
LDL CALC: 35 mg/dL (ref <130)
TRIGLYCERIDES: 260 mg/dL — AB (ref <150)
Total CHOL/HDL Ratio: 3.6 Ratio (ref <=5.0)
VLDL: 52 mg/dL — AB (ref <30)

## 2016-07-19 LAB — PSA: PSA: 0.2 ng/mL (ref <=4.0)

## 2016-07-19 LAB — TESTOSTERONE TOTAL,FREE,BIO, MALES
Albumin: 4.1 g/dL (ref 3.6–5.1)
SEX HORMONE BINDING: 28 nmol/L (ref 10–50)
Testosterone: 69 ng/dL — ABNORMAL LOW (ref 250–827)

## 2016-07-19 LAB — HEMOGLOBIN A1C
Hgb A1c MFr Bld: 6.7 % — ABNORMAL HIGH (ref <5.7)
Mean Plasma Glucose: 146 mg/dL

## 2016-07-19 LAB — HEPATITIS C ANTIBODY: HCV Ab: NEGATIVE

## 2016-07-19 LAB — HIV ANTIBODY (ROUTINE TESTING W REFLEX): HIV 1&2 Ab, 4th Generation: NONREACTIVE

## 2016-07-24 ENCOUNTER — Ambulatory Visit: Payer: Self-pay | Admitting: Family Medicine

## 2016-07-24 ENCOUNTER — Encounter: Payer: Self-pay | Admitting: Psychiatry

## 2016-07-24 ENCOUNTER — Telehealth: Payer: Self-pay

## 2016-07-24 ENCOUNTER — Ambulatory Visit: Payer: 59 | Admitting: Psychiatry

## 2016-07-24 ENCOUNTER — Ambulatory Visit (INDEPENDENT_AMBULATORY_CARE_PROVIDER_SITE_OTHER): Payer: 59 | Admitting: Psychiatry

## 2016-07-24 VITALS — BP 145/92 | HR 66 | Temp 98.5°F | Wt 250.6 lb

## 2016-07-24 DIAGNOSIS — F331 Major depressive disorder, recurrent, moderate: Secondary | ICD-10-CM | POA: Diagnosis not present

## 2016-07-24 DIAGNOSIS — G4700 Insomnia, unspecified: Secondary | ICD-10-CM

## 2016-07-24 DIAGNOSIS — G894 Chronic pain syndrome: Secondary | ICD-10-CM | POA: Diagnosis not present

## 2016-07-24 MED ORDER — ALPRAZOLAM 0.5 MG PO TABS: 0.5000 mg | ORAL_TABLET | Freq: Two times a day (BID) | ORAL | 1 refills | Status: DC | PRN

## 2016-07-24 MED ORDER — ZOLPIDEM TARTRATE 10 MG PO TABS: 10.0000 mg | ORAL_TABLET | Freq: Every day | ORAL | 1 refills | Status: DC

## 2016-07-24 MED ORDER — BUPROPION HCL ER (XL) 150 MG PO TB24: 150.0000 mg | ORAL_TABLET | Freq: Every day | ORAL | 1 refills | Status: DC

## 2016-07-24 MED ORDER — HYDROXYZINE PAMOATE 25 MG PO CAPS: 25.0000 mg | ORAL_CAPSULE | Freq: Two times a day (BID) | ORAL | 2 refills | Status: DC | PRN

## 2016-07-24 NOTE — Telephone Encounter (Signed)
rx faxed and confirmed for ambien 10mg  id # [id number] order [id number]  and xanax .5mg  id # [id number] order # [id number]

## 2016-07-24 NOTE — Progress Notes (Signed)
Psychiatric MD/ NP Progress Note  Patient Identification: Dale [name] MRN:  [id number] Date of Evaluation:  07/24/2016 Referral Source: [facility].  Chief Complaint:   Chief Complaint    Follow-up; Medication Refill     Visit Diagnosis:    ICD-9-CM ICD-10-CM   1. MDD (major depressive disorder), recurrent episode, moderate (HCC) 296.32 F33.1   2. Chronic pain syndrome 338.4 G89.4    Diagnosis:   Patient Active Problem List   Diagnosis Date Noted  . Anxiety [F41.9] 07/17/2016  . Insomnia [G47.00] 07/17/2016  . Pain medication agreement [Z79.899] 01/30/2016  . Eczema [L30.9] 01/22/2016  . Benign essential tremor [G25.0] 10/03/2015  . Glaucoma [H40.9] 06/27/2015  . B12 deficiency [E53.8] 06/27/2015  . 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 Billowing mitral valve [I34.1] 06/26/2015  . History of prolonged Q-T interval on ECG [Z86.79] 06/26/2015  . 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 Depression [F32.9] 06/26/2015  . Low testosterone [E29.1] 06/26/2015  . Obesity (BMI 30.0-34.9) [E66.9] 06/26/2015  . CAD (coronary artery disease), native coronary artery [I25.10] 06/26/2015  . 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 Fatty infiltration of liver [K76.0] 10/21/2014  . Psoriasis [L40.9] 07/22/2014  . Acid reflux [K21.9] 07/22/2014  . Type 2 diabetes, controlled, with neuropathy ([place]) [E11.40] 06/22/2014  . HLD (hyperlipidemia) [E78.5] 06/22/2014  . Essential (primary) hypertension [I10] 06/22/2014  . 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 Chronic pain associated with significant psychosocial dysfunction [G89.4] 04/29/2014  . History of surgical procedure [Z98.890] 04/29/2014  . Polypharmacy [Z79.899] 09/09/2013  . Pain medication agreement signed [Z79.899] 09/09/2013  . Other long term (current) drug therapy [Z79.899] 11/20/1998   History of Present Illness:    Patient is a 55 year old  male who  presented for the follow-up appointment. He appeared Tired  during the interview. Patient reported that he was running out of his Ambien and he has as his primary care physician to refill his Ambien. However it was noted that his Ambien was actually discontinued during the month of April. I called the pharmacy and they reported that he originally had the prescription of Ambien from January which she has felt. Patient is also taking temazepam which was started in April. He reported that he is unable to tolerate the Seroquel and would like to stop the medication. He remained focus on the prescriptions of Ambien and Xanax. He reported that he takes Xanax to help with his anxiety although he does not appear anxious during the interview. He currently denied having any suicidal ideations or plans. He denied having any perceptual disturbances at this time. He reported that he has been taking his pain medications on a regular basis at this time.    Patient currently denied having any suicidal homicidal ideations or plans.  He denied having any perceptual disturbances. We discussed at length about her different medications options and he agreed with the plan  Elements:  Location:  depression and anxiety. Associated Signs/Symptoms: Depression Symptoms:  depressed mood, insomnia, psychomotor retardation, fatigue, anxiety, loss of energy/fatigue, (Hypo) Manic Symptoms:  none Anxiety Symptoms:  Excessive Worry, Social Anxiety, Psychotic Symptoms:  none PTSD Symptoms: Negative NA  Past Medical History:  Past Medical History:  Diagnosis Date  . ADHD (attention deficit hyperactivity disorder)   . Anxiety   . Diabetes mellitus, type II ([place])   . Heart disease     Past Surgical History:  Procedure Laterality Date  . SPINE SURGERY  Multiple surgeries, initial 2001, thoracic disc repair and recurrent revisions.  . THROAT SURGERY     Family History:  Family History  Problem Relation Age of Onset   . Depression Mother   . Anxiety disorder Mother   . Diabetes Mother   . Heart failure Mother   . Heart attack Father   . Diabetes Sister   . Obesity Sister   . Drug abuse Brother   . Alcohol abuse Brother   . Depression Sister   . Anxiety disorder Sister   . Drug abuse Sister   . Anxiety disorder Sister   . Seizures Sister   . Diabetes Sister   . Depression Sister    Social History:   Social History   Social History  . Marital status: Married    Spouse name: N/A  . Number of children: N/A  . Years of education: 1 yr of college   Occupational History  . Disabled    Social History Main Topics  . Smoking status: Former Smoker    Types: Cigarettes    Start date: 08/29/1979    Quit date: 11/04/2010  . Smokeless tobacco: Former Systems developer    Quit date: 11/19/2010     Comment: 1 to 1.5 packs per day  . Alcohol use No  . Drug use: No  . Sexual activity: Not Currently   Other Topics Concern  . Not on file   Social History Narrative  . No narrative on file   Additional Social History: Married x 28 years. Retied from [employer]. Wife is Freight forwarder at [employer]. Has a daughter, 70 yo. Pt is on  disability due to back pain and depression.   Musculoskeletal: Strength & Muscle Tone: decreased Gait & Station: normal Patient leans: N/A  Psychiatric Specialty Exam: HPI  ROS  There were no vitals taken for this visit.There is no height or weight on file to calculate BMI.  General Appearance: Casual  Eye Contact:  Fair  Speech:  Clear and Coherent  Volume:  Normal  Mood:  Anxious and Depressed  Affect:  Blunt  Thought Process:  Coherent and Goal Directed  Orientation:  Full (Time, Place, and Person)  Thought Content:  WDL  Suicidal Thoughts:  No  Homicidal Thoughts:  No  Memory:  Immediate;   Fair  Judgement:  Fair  Insight:  Fair  Psychomotor Activity:  Psychomotor Retardation  Concentration:  Fair  Recall:  [employer] of Knowledge:Fair  Language: Fair  Akathisia:  No   Handed:  Right  AIMS (if indicated):    Assets:  Communication Skills Desire for Improvement Social Support  ADL's:  Intact  Cognition: WNL  Sleep:  4-5   Is the patient at risk to self?  No. Has the patient been a risk to self in the past 6 months?  No. Has the patient been a risk to self within the distant past?  No. Is the patient a risk to others?  No. Has the patient been a risk to others in the past 6 months?  No. Has the patient been a risk to others within the distant past?  No.  Allergies:   Allergies  Allergen Reactions  . Benzoin Other (See Comments)    blisters  . Morphine Other (See Comments)  . Penicillins Other (See Comments)   Current Medications: Current Outpatient Prescriptions  Medication Sig Dispense Refill  . ALPRAZolam (XANAX) 0.5 MG tablet Take 1 tablet (0.5 mg total) by mouth 3 (three) times  daily. 270 tablet 1  . amLODipine (NORVASC) 5 MG tablet Take 1 tablet by mouth daily.    Marland Kitchen aspirin 81 MG tablet Take 1 tablet (81 mg total) by mouth daily. 30 tablet 0  . atorvastatin (LIPITOR) 10 MG tablet TAKE 1 TABLET BY MOUTH  DAILY AT 2 PM. 90 tablet 0  . baclofen (LIORESAL) 10 MG tablet Take 10 mg by mouth 4 (four) times daily.    Marland Kitchen buPROPion (WELLBUTRIN XL) 150 MG 24 hr tablet Take 1 tablet (150 mg total) by mouth daily. 90 tablet 1  . cyanocobalamin 1000 MCG tablet Take 100 mcg by mouth daily.    Marland Kitchen desonide (DESOWEN) 0.05 % cream Apply topically 2 (two) times daily as needed. 60 g 2  . desoximetasone (TOPICORT) 0.25 % cream Apply 1 application topically 2 (two) times daily as needed. 60 g 2  . glucose blood (ONETOUCH VERIO) test strip Check blood sugar up to 3x daily. 100 each 5  . hydrochlorothiazide (HYDRODIURIL) 25 MG tablet Take 1 tablet (25 mg total) by mouth daily. 90 tablet 3  . hydrOXYzine (VISTARIL) 25 MG capsule Take 1 capsule (25 mg total) by mouth 2 (two) times daily as needed. 90 capsule 2  . latanoprost (XALATAN) 0.005 % ophthalmic solution  Place 1 drop into both eyes at bedtime.    Marland Kitchen lisinopril (PRINIVIL,ZESTRIL) 5 MG tablet Take 1 tablet (5 mg total) by mouth daily. 90 tablet 3  . meloxicam (MOBIC) 7.5 MG tablet Take 7.5 mg by mouth 2 (two) times daily.    . metFORMIN (GLUCOPHAGE) 1000 MG tablet Take 1 tablet (1,000 mg total) by mouth 2 (two) times daily with a meal. 180 tablet 3  . Multiple Vitamin (MULTIVITAMIN) tablet Take 1 tablet by mouth daily.    . naloxegol oxalate (MOVANTIK) 25 MG TABS tablet Take by mouth.    . ondansetron (ZOFRAN) 4 MG tablet Take 1 tablet (4 mg total) by mouth every 8 (eight) hours as needed for nausea or vomiting. 30 tablet 2  . QUEtiapine (SEROQUEL) 100 MG tablet Take 1 tablet (100 mg total) by mouth at bedtime. 30 tablet 0  . tamsulosin (FLOMAX) 0.4 MG CAPS capsule Take 1 capsule (0.4 mg total) by mouth daily. 30 capsule 11  . temazepam (RESTORIL) 15 MG capsule Take 1 capsule (15 mg total) by mouth at bedtime as needed for sleep. 30 capsule 0  . traMADol (ULTRAM) 50 MG tablet Take 2 tablets by mouth 4 (four) times daily as needed.  2  . zolpidem (AMBIEN) 10 MG tablet Take 1 tablet (10 mg total) by mouth at bedtime. 30 tablet 0   No current facility-administered medications for this visit.     Previous Psychotropic Medications: Taking most of the medications since 2001-2002.  wellbutrin since 2008    Substance Abuse History in the last 12 months:  No.  Consequences of Substance Abuse: Negative NA  Medical Decision Making:  Review of Psycho-Social Stressors (1)  Treatment Plan Summary: Medication management   Discussed with patient about his medications. I will adjust his medications as follows. Discontinue Paxil by taking 10 mg on alternate days.  DC Seroquel  Continue Ambien 10 mg by mouth daily at bedtime. He was given 90 day supply of the medication  Continue Xanax 0.5 mg by mouth twice a day he was given 90 day supply of the medication   Follow up 3 months or earlier depending  on his symptoms  More than 50% of the time spent  in psychoeducation, counseling and coordination of care.    This note was generated in part or whole with voice recognition software. Voice regonition is usually quite accurate but there are transcription errors that can and very often do occur. I apologize for any typographical errors that were not detected and corrected.    Rainey Pines, MD    9/20/201710:46 AM

## 2016-07-29 ENCOUNTER — Telehealth: Payer: Self-pay

## 2016-07-29 ENCOUNTER — Encounter: Payer: Self-pay | Admitting: Family Medicine

## 2016-07-29 NOTE — Telephone Encounter (Signed)
pt states he last seen you 07-24-16 and he states that he has been very depressed.  and sleeping from 4:00 am and then got up 10:00am.  pt states he is cryig and breaking down something needs to be done.

## 2016-07-30 ENCOUNTER — Other Ambulatory Visit: Payer: Self-pay | Admitting: Family Medicine

## 2016-07-30 ENCOUNTER — Encounter: Payer: Self-pay | Admitting: Family Medicine

## 2016-07-30 DIAGNOSIS — E785 Hyperlipidemia, unspecified: Secondary | ICD-10-CM

## 2016-07-30 MED ORDER — FISH OIL 1200 MG PO CAPS: 1200.0000 mg | ORAL_CAPSULE | Freq: Every day | ORAL | Status: DC

## 2016-07-30 MED ORDER — FLAXSEED OIL 1000 MG PO CAPS: 1000.0000 mg | ORAL_CAPSULE | Freq: Every day | ORAL | 0 refills | Status: DC

## 2016-07-30 MED ORDER — ATORVASTATIN CALCIUM 10 MG PO TABS: ORAL_TABLET | ORAL | 3 refills | Status: DC

## 2016-07-30 NOTE — Addendum Note (Signed)
Addended by: Olin Hauser on: 07/30/2016 10:10 AM   Modules accepted: Orders

## 2016-07-30 NOTE — Telephone Encounter (Signed)
Called pt this morning who appeared very sleepy. He stated that he is sleeping poorly with Ambien and feeling depressed. He was getting Ambien and the pharmacy although it was not prescribed to him. We discussed about starting the Ambien at 8 PM so he can be sleeping well at night. Patient agreed with the plan. Also advised him that he continues to have problems sleep that can bring the bottle back to the office and it will be changed to another medication and he demonstrated understanding. Patient was advised to continue taking his antidepressants on a regular basis. Patient currently denied having any suicidal homicidal ideations or plans

## 2016-08-06 ENCOUNTER — Other Ambulatory Visit: Payer: Self-pay | Admitting: Family Medicine

## 2016-08-06 ENCOUNTER — Encounter: Payer: Self-pay | Admitting: Family Medicine

## 2016-08-06 DIAGNOSIS — G3184 Mild cognitive impairment, so stated: Secondary | ICD-10-CM | POA: Insufficient documentation

## 2016-08-06 DIAGNOSIS — G603 Idiopathic progressive neuropathy: Secondary | ICD-10-CM

## 2016-08-06 DIAGNOSIS — G629 Polyneuropathy, unspecified: Secondary | ICD-10-CM | POA: Insufficient documentation

## 2016-08-06 MED ORDER — PHOSPHATIDYLSERINE-DHA-EPA 100-19.5-6.5 MG PO CAPS: 1.0000 | ORAL_CAPSULE | Freq: Every day | ORAL | Status: DC

## 2016-08-06 MED ORDER — GABAPENTIN 300 MG PO CAPS: 300.0000 mg | ORAL_CAPSULE | Freq: Every day | ORAL | Status: DC

## 2016-08-07 DIAGNOSIS — E6609 Other obesity due to excess calories: Secondary | ICD-10-CM | POA: Insufficient documentation

## 2016-08-21 ENCOUNTER — Ambulatory Visit: Admit: 2016-08-21 | Payer: Commercial Managed Care - HMO | Admitting: Unknown Physician Specialty

## 2016-08-21 SURGERY — COLONOSCOPY WITH PROPOFOL
Anesthesia: General

## 2016-09-02 ENCOUNTER — Encounter: Payer: Self-pay | Admitting: Family Medicine

## 2016-09-09 ENCOUNTER — Ambulatory Visit: Payer: Commercial Managed Care - HMO | Admitting: Physical Therapy

## 2016-09-16 ENCOUNTER — Encounter: Payer: Commercial Managed Care - HMO | Admitting: Physical Therapy

## 2016-09-23 ENCOUNTER — Encounter: Payer: Commercial Managed Care - HMO | Admitting: Physical Therapy

## 2016-09-28 ENCOUNTER — Encounter: Payer: Self-pay | Admitting: Family Medicine

## 2016-09-30 ENCOUNTER — Encounter: Payer: Commercial Managed Care - HMO | Admitting: Physical Therapy

## 2016-10-06 ENCOUNTER — Other Ambulatory Visit: Payer: Self-pay | Admitting: Psychiatry

## 2016-10-14 ENCOUNTER — Encounter: Payer: Commercial Managed Care - HMO | Admitting: Physical Therapy

## 2016-10-21 ENCOUNTER — Ambulatory Visit: Payer: Self-pay | Admitting: Family Medicine

## 2016-10-23 ENCOUNTER — Ambulatory Visit (INDEPENDENT_AMBULATORY_CARE_PROVIDER_SITE_OTHER): Payer: 59 | Admitting: Psychiatry

## 2016-10-23 ENCOUNTER — Encounter: Payer: Self-pay | Admitting: Family Medicine

## 2016-10-23 ENCOUNTER — Encounter: Payer: Self-pay | Admitting: Psychiatry

## 2016-10-23 ENCOUNTER — Ambulatory Visit (INDEPENDENT_AMBULATORY_CARE_PROVIDER_SITE_OTHER): Payer: Commercial Managed Care - HMO | Admitting: Family Medicine

## 2016-10-23 VITALS — BP 111/72 | HR 78 | Temp 98.8°F | Resp 16 | Ht 72.0 in | Wt 236.0 lb

## 2016-10-23 VITALS — BP 138/83 | HR 80 | Temp 98.7°F | Wt 235.8 lb

## 2016-10-23 DIAGNOSIS — F331 Major depressive disorder, recurrent, moderate: Secondary | ICD-10-CM

## 2016-10-23 DIAGNOSIS — E1140 Type 2 diabetes mellitus with diabetic neuropathy, unspecified: Secondary | ICD-10-CM | POA: Diagnosis not present

## 2016-10-23 DIAGNOSIS — F419 Anxiety disorder, unspecified: Secondary | ICD-10-CM | POA: Diagnosis not present

## 2016-10-23 DIAGNOSIS — M961 Postlaminectomy syndrome, not elsewhere classified: Secondary | ICD-10-CM | POA: Diagnosis not present

## 2016-10-23 DIAGNOSIS — F1394 Sedative, hypnotic or anxiolytic use, unspecified with sedative, hypnotic or anxiolytic-induced mood disorder: Secondary | ICD-10-CM | POA: Diagnosis not present

## 2016-10-23 DIAGNOSIS — F5101 Primary insomnia: Secondary | ICD-10-CM | POA: Diagnosis not present

## 2016-10-23 LAB — POCT GLYCOSYLATED HEMOGLOBIN (HGB A1C): Hemoglobin A1C: 6.6

## 2016-10-23 MED ORDER — METFORMIN HCL 1000 MG PO TABS: 1000.0000 mg | ORAL_TABLET | Freq: Two times a day (BID) | ORAL | 3 refills | Status: DC

## 2016-10-23 MED ORDER — PAROXETINE HCL 40 MG PO TABS: 40.0000 mg | ORAL_TABLET | ORAL | 1 refills | Status: DC

## 2016-10-23 MED ORDER — QUETIAPINE FUMARATE 100 MG PO TABS: 100.0000 mg | ORAL_TABLET | Freq: Every day | ORAL | 0 refills | Status: DC

## 2016-10-23 MED ORDER — BACLOFEN 10 MG PO TABS: 10.0000 mg | ORAL_TABLET | Freq: Four times a day (QID) | ORAL | 0 refills | Status: DC

## 2016-10-23 MED ORDER — PAROXETINE HCL 40 MG PO TABS: 40.0000 mg | ORAL_TABLET | ORAL | 0 refills | Status: DC

## 2016-10-23 MED ORDER — QUETIAPINE FUMARATE 100 MG PO TABS: 100.0000 mg | ORAL_TABLET | Freq: Every day | ORAL | 1 refills | Status: DC

## 2016-10-23 MED ORDER — BUPROPION HCL ER (XL) 150 MG PO TB24: 150.0000 mg | ORAL_TABLET | Freq: Every day | ORAL | 1 refills | Status: DC

## 2016-10-23 NOTE — Assessment & Plan Note (Signed)
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 Remains high risk for foot complications given significant lack of monofilament sensation bilaterally.  Plan: 1. Continue Metformin 1000mg  BID - refilled today 2. Refilled Lisinopril 5mg  daily - DM renal protection, BP is stable, consider combination pill with HCTZ in future, however may need to adjust dose, review BP meds next visit 3. Check BMET for f/u K, Cr on new start ACE (however 3 months ago, he did not follow-up when this was last ordered) 4. UTD vaccinations, declined Flu 5.UTD Ophthalmology exam 6. Continue ASA, statin 7. Follow-up 6 months, A1c - re-evaluate Lisinopril combination pill / BP, then next physical 07/2017

## 2016-10-23 NOTE — Patient Instructions (Signed)
Thank you for coming in to clinic today.  1. Keep up the good work with Diabetes control, A1c 6.6 today 2. Refilled Metformin 1000mg  twice a day, [facility] 3. Refilled Baclofen 10mg  4 times daily, 120 tabs, sent to [facility]  For Psychiatry, please try to locate your new provider of choice, and check with insurance coverage to make sure in network. Often times you do not need a referral from [place], but you can go ahead and schedule new patient appointment. If you do need a referral, let us know.  As discussed, I cannot prescribe the Alprazolam, Ambien, Temazepam, or ADHD medications for you at this time. Follow-up with your current refills as requested from your Psychiatrist and your pharmacy.  Please schedule a follow-up appointment with Dr. Parks Ranger in 6 months for Diabetes follow-up, and then 07/2017 for Annual Physical  If you have any other questions or concerns, please feel free to call the clinic or send a message through [place]. You may also schedule an earlier appointment if necessary.  [name], DO [place]

## 2016-10-23 NOTE — Assessment & Plan Note (Signed)
Worsening chronic problem, in setting of significant MDD, anxiety, on variety of treatments, concern polypharmacy. - Previously on Ambien, Tamzepam, Xanax. Currently on Seroquel - Followed by [facility], last visit today, per chart review seems to have used his sleeping medications more frequently and is out prior to 90 day refill, 11/15/16 - I advised him that I could not prescribe these medications, he was not requesting them from me today, and he understands will need to wait until able to fill at pharmacy - Additionally requesting to establish with alternative Psychiatry

## 2016-10-23 NOTE — Assessment & Plan Note (Signed)
Refilled baclofen to local pharmacy, waiting on mail order

## 2016-10-23 NOTE — Progress Notes (Signed)
Psychiatric MD/ NP Progress Note  Patient Identification: Dale [name] MRN:  [id number] Date of Evaluation:  10/23/2016 Referral Source: [facility].  Chief Complaint:   Chief Complaint    Follow-up; Medication Refill     Visit Diagnosis:    ICD-9-CM ICD-10-CM   1. MDD (major depressive disorder), recurrent episode, moderate (HCC) 296.32 F33.1   2. Sedative, hypnotic or anxiolytic-induced mood disorder (HCC) 292.84 F13.94    E980.2     Diagnosis:   Patient Active Problem List   Diagnosis Date Noted  . MCI (mild cognitive impairment) with memory loss [G31.84] 08/06/2016  . Peripheral neuropathy ([place]) [G62.9] 08/06/2016  . Anxiety [F41.9] 07/17/2016  . Insomnia [G47.00] 07/17/2016  . Pain medication agreement [Z02.89] 01/30/2016  . Eczema [L30.9] 01/22/2016  . Benign essential tremor [G25.0] 10/03/2015  . Glaucoma [H40.9] 06/27/2015  . B12 deficiency [E53.8] 06/27/2015  . 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 Billowing mitral valve [I34.1] 06/26/2015  . History of prolonged Q-T interval on ECG [Z87.898] 06/26/2015  . 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 Depression [F32.9] 06/26/2015  . Low testosterone [E34.9] 06/26/2015  . Obesity (BMI 30.0-34.9) [E66.9] 06/26/2015  . CAD (coronary artery disease), native coronary artery [I25.10] 06/26/2015  . 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 Fatty infiltration of liver [K76.0] 10/21/2014  . Psoriasis [L40.9] 07/22/2014  . Acid reflux [K21.9] 07/22/2014  . Type 2 diabetes, controlled, with neuropathy ([employer]) [E11.40] 06/22/2014  . HLD (hyperlipidemia) [E78.5] 06/22/2014  . Essential (primary) hypertension [I10] 06/22/2014  . 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 Chronic pain associated with significant psychosocial dysfunction [G89.4] 04/29/2014  . History of surgical procedure [Z98.890] 04/29/2014  . Polypharmacy [Z79.899] 09/09/2013  . Pain medication  agreement signed [Z02.89] 09/09/2013  . Other long term (current) drug therapy [Z79.899] 11/20/1998   History of Present Illness:    Patient is a 55 year-old  male who presented for the follow-up appointment. He appeared tired  during the interview. Patient reported that he was running out of his Ambien and was taking more than the amount as he has had 90 pills on October 12. He was focused on getting higher dose of Xanax as well. He was demanding that I should increase the dose of the Xanax to 1 mg 4 times daily as he was taking in the past. He has also received a prescription of temazepam from his neurologist. Patient remained focus on getting the sleeping medications. He reported that his pain doctor is cutting down on his pain prescriptions as well. We discussed about not refilling his Ambien and the Xanax as this time as they are not due until Jan 12th. He will not be given any prescriptions at this time. He reported that he has started taking the Paxil when the pain doctor decrease the dose of the pain medications and is taking Paxil 40 mg at this time.  I only refilled his Paxil and Seroquel at this time. Patient was not happy about his medications.  He currently denied having any suicidal ideations or plans. He denied having any perceptual disturbances at this time.We discussed at length about her different medications options and he agreed with the plan  Elements:  Location:  depression and anxiety. Associated Signs/Symptoms: Depression Symptoms:  depressed mood, insomnia, psychomotor retardation, fatigue, anxiety, loss of energy/fatigue, (Hypo) Manic Symptoms:  none Anxiety Symptoms:  Excessive Worry, Social Anxiety, Psychotic Symptoms:  none PTSD Symptoms: Negative NA  Past Medical History:  Past Medical History:  Diagnosis Date  . ADHD (attention deficit hyperactivity disorder)   . Anxiety   . Diabetes mellitus, type II ([place])   . Heart disease     Past Surgical History:   Procedure Laterality Date  . SPINE SURGERY     Multiple surgeries, initial 2001, thoracic disc repair and recurrent revisions.  . THROAT SURGERY     Family History:  Family History  Problem Relation Age of Onset  . Depression Mother   . Anxiety disorder Mother   . Diabetes Mother   . Heart failure Mother   . Heart attack Father   . Diabetes Sister   . Obesity Sister   . Drug abuse Brother   . Alcohol abuse Brother   . Depression Sister   . Anxiety disorder Sister   . Drug abuse Sister   . Anxiety disorder Sister   . Seizures Sister   . Diabetes Sister   . Depression Sister    Social History:   Social History   Social History  . Marital status: Married    Spouse name: N/A  . Number of children: N/A  . Years of education: 1 yr of college   Occupational History  . Disabled    Social History Main Topics  . Smoking status: Former Smoker    Types: Cigarettes    Start date: 08/29/1979    Quit date: 11/04/2010  . Smokeless tobacco: Former Systems developer    Quit date: 11/19/2010     Comment: 1 to 1.5 packs per day  . Alcohol use No  . Drug use: No  . Sexual activity: Not Currently   Other Topics Concern  . None   Social History Narrative  . None   Additional Social History: Married x 28 years. Retied from [employer]. Wife is Freight forwarder at [employer]. Has a daughter, 33 yo. Pt is on  disability due to back pain and depression.   Musculoskeletal: Strength & Muscle Tone: decreased Gait & Station: normal Patient leans: N/A  Psychiatric Specialty Exam: Medication Refill     ROS  Blood pressure 138/83, pulse 80, temperature 98.7 F (37.1 C), temperature source Oral, weight 235 lb 12.8 oz (107 kg).Body mass index is 31.98 kg/m.  General Appearance: Casual  Eye Contact:  Fair  Speech:  Clear and Coherent  Volume:  Normal  Mood:  Anxious and Depressed  Affect:  Blunt  Thought Process:  Coherent and Goal Directed  Orientation:  Full (Time, Place, and Person)  Thought  Content:  WDL  Suicidal Thoughts:  No  Homicidal Thoughts:  No  Memory:  Immediate;   Fair  Judgement:  Fair  Insight:  Fair  Psychomotor Activity:  Psychomotor Retardation  Concentration:  Fair  Recall:  [employer] of Knowledge:Fair  Language: Fair  Akathisia:  No  Handed:  Right  AIMS (if indicated):    Assets:  Communication Skills Desire for Improvement Social Support  ADL's:  Intact  Cognition: WNL  Sleep:  4-5   Is the patient at risk to self?  No. Has the patient been a risk to self in the past 6 months?  No. Has the patient been a risk to self within the distant past?  No. Is the patient a risk to others?  No. Has the patient been a risk to others in the past 6 months?  No. Has the patient been a risk to others within the distant past?  No.  Allergies:   Allergies  Allergen Reactions  . Benzoin Other (See Comments)    blisters  . Morphine Other (See Comments)  . Penicillins Other (See Comments)   Current Medications: Current Outpatient Prescriptions  Medication Sig Dispense Refill  . ALPRAZolam (XANAX) 0.5 MG tablet Take 1 tablet (0.5 mg total) by mouth 2 (two) times daily as needed for anxiety. 180 tablet 1  . aspirin 81 MG tablet Take 1 tablet (81 mg total) by mouth daily. 30 tablet 0  . atorvastatin (LIPITOR) 10 MG tablet TAKE 1 TABLET BY MOUTH  DAILY AT 2 PM. 90 tablet 3  . baclofen (LIORESAL) 10 MG tablet Take 10 mg by mouth 4 (four) times daily.    . Blood Glucose Monitoring Suppl ([place]) w/Device KIT     . buPROPion (WELLBUTRIN XL) 150 MG 24 hr tablet Take 1 tablet (150 mg total) by mouth daily. 90 tablet 1  . BUTRANS 10 MCG/HR PTWK patch     . cyanocobalamin 1000 MCG tablet Take 100 mcg by mouth daily.    Marland Kitchen desonide (DESOWEN) 0.05 % cream Apply topically 2 (two) times daily as needed. 60 g 2  . desoximetasone (TOPICORT) 0.25 % cream Apply 1 application topically 2 (two) times daily as needed. 60 g 2  . Flaxseed, Linseed, (FLAXSEED  OIL) 1000 MG CAPS Take 1 capsule (1,000 mg total) by mouth daily.  0  . gabapentin (NEURONTIN) 300 MG capsule Take 1 capsule (300 mg total) by mouth at bedtime.    . gabapentin (NEURONTIN) 300 MG capsule Take by mouth.    Marland Kitchen glucose blood (ONETOUCH VERIO) test strip Check blood sugar up to 3x daily. 100 each 5  . hydrochlorothiazide (HYDRODIURIL) 25 MG tablet Take 1 tablet (25 mg total) by mouth daily. 90 tablet 3  . hydrOXYzine (VISTARIL) 25 MG capsule Take 1 capsule (25 mg total) by mouth 2 (two) times daily as needed. 180 capsule 2  . latanoprost (XALATAN) 0.005 % ophthalmic solution Place 1 drop into both eyes at bedtime.    Marland Kitchen lisinopril (PRINIVIL,ZESTRIL) 5 MG tablet Take 1 tablet (5 mg total) by mouth daily. 90 tablet 3  . meloxicam (MOBIC) 7.5 MG tablet Take 7.5 mg by mouth 2 (two) times daily.    . metFORMIN (GLUCOPHAGE) 1000 MG tablet Take 1 tablet (1,000 mg total) by mouth 2 (two) times daily with a meal. 180 tablet 3  . Multiple Vitamin (MULTIVITAMIN) tablet Take 1 tablet by mouth daily.    . Omega-3 Fatty Acids (FISH OIL) 1200 MG CAPS Take 1 capsule (1,200 mg total) by mouth daily.    . ondansetron (ZOFRAN) 4 MG tablet Take 1 tablet (4 mg total) by mouth every 8 (eight) hours as needed for nausea or vomiting. 30 tablet 2  . Phosphatidylserine-DHA-EPA 100-19.5-6.5 MG CAPS Take 1 capsule by mouth daily.    . tamsulosin (FLOMAX) 0.4 MG CAPS capsule Take 1 capsule (0.4 mg total) by mouth daily. 30 capsule 11  . traMADol (ULTRAM) 50 MG tablet Take 2 tablets by mouth 4 (four) times daily as needed.  2  . zolpidem (AMBIEN) 10 MG tablet Take 1 tablet (10 mg total) by mouth at bedtime. 90 tablet 1  . amLODipine (NORVASC) 5 MG tablet Take 1 tablet by mouth daily.    Marland Kitchen aspirin EC 81 MG tablet Take by mouth.    . hydrochlorothiazide (HYDRODIURIL) 25 MG tablet Take by mouth.    . hydrOXYzine (ATARAX/VISTARIL) 25 MG tablet Take by mouth.    Marland Kitchen  PARoxetine (PAXIL) 40 MG tablet Take 1 tablet (40 mg  total) by mouth every morning. 90 tablet 1  . QUEtiapine (SEROQUEL) 100 MG tablet Take 1 tablet (100 mg total) by mouth at bedtime. 90 tablet 1  . temazepam (RESTORIL) 15 MG capsule Take by mouth.     No current facility-administered medications for this visit.     Previous Psychotropic Medications: Taking most of the medications since 2001-2002.  wellbutrin since 2008    Substance Abuse History in the last 12 months:  No.  Consequences of Substance Abuse: Negative NA  Medical Decision Making:  Review of Psycho-Social Stressors (1)  Treatment Plan Summary: Medication management   Discussed with patient about his medications. I will adjust his medications as follows. Refilled Paxil 40 mg and Seroquel 100 mg at this time with one month supply to the local pharmacy and 90 day supply [facility]. He has refills on the other medications and his Ambien and Xanax are not due to be refilled until  January 12    Follow up 3 months or earlier depending on his symptoms  More than 50% of the time spent in psychoeducation, counseling and coordination of care.    This note was generated in part or whole with voice recognition software. Voice regonition is usually quite accurate but there are transcription errors that can and very often do occur. I apologize for any typographical errors that were not detected and corrected.    Rainey Pines, MD    12/20/201711:11 AM

## 2016-10-23 NOTE — Assessment & Plan Note (Addendum)
Chronic problem, in setting of significant MDD, with insomnia - Currently followed by [facility], on Paxil, Xanax - See behavioral health note and my note for details, I am concerned about polypharmacy and sedating medications, he did not agree with prior tapers of the Xanax by chart review - I emphasized that I am not planning on prescribe benzodiazepines or other controlled substance for sleep such as Ambien long-term, given his complex history. No rx provided today, he needs to follow-up with Psych on when rx are able to be filled at pharmacy 11/15/16 - Additionally requesting to establish with alternative Psychiatry, advised him that if he would like to locate psychiatry accepting new patients that would be covered by his insurance, he may provide the name to [place] and if needs referral we could work on arranging, otherwise may be self referral and he could schedule this and transition care from [facility] to new psychiatry if he chooses.

## 2016-10-23 NOTE — Progress Notes (Signed)
Subjective:    Patient ID: Dale [name], male    DOB: [birth year], 55 y.o.   MRN: [id number]  Dale [name] is a 55 y.o. male presenting on 10/23/2016 for Diabetes (highest BS 218 and low BS 111)   HPI   CHRONIC DM, Type 2: Reports no concerns today. CBGs: Avg 100s, Low 100 (asymptomatic), High < 200. Checks CBGs 2-3x weekly (less frequently now) Meds: Metformin 1000mg  BID Reports good compliance. Tolerating well w/o side-effects Currently on ACE, recently started on Lisinopril 5mg  daily (07/2016, never followed up with chemistry lab) Lifestyle: Diet (drinks mostly water, tries to follow DM diet) / Exercise (limited due to chronic pain but regular walking most days) Admits stable persistent bilateral foot and lower ext numbness, tingling with some pains, consistent with chronic low back injury, onset prior to diagnosis diabetes (>6 years) Denies hypoglycemia, blurred vision, loss of vision, worsening numbness burning  Major Depression / Anxiety / Insomnia: - Last seen by [facility] Dr Gretel Acre today 10/23/16 for follow-up MDD, patient in interval had contacted me via mychart explaining passing of his long time pet dog, causing him significant distress and anxiety/depression. Medication changes today per chart review, his Paxil 40mg  was refilled and Seroquel 100mg  nightly was refilled, he is not due for Xanax and Ambien refills until 11/15/16 due to requirements on 90 day rx filling per pharmacy. - Today patient reports he is frustrated with current Psychiatry and expresses his concerns with request for resuming prior doses of medications, specifically Xanax and his sleeping medications Ambien / Temazpem. He states these were lowered, and in past we had reviewed this with concerns of polypharmacy especially sedating medications and risks of these medications. - Previously taking Alprazolam 1mg  4 times a day, now recently reduced to 0.5mg  BID - Admits poor sleep at night, difficulty falling  asleep, he is more sleepy during daytime now, also attributes poor focus during day to ADHD, previously diagnosed prior psychiatry in [place] treating him with stimulant medication - Admits rarely leaves his house due to depression/anxiety, worst after passing of his pet dog - He expresses request to locate a new psychiatry office  Social History  Substance Use Topics  . Smoking status: Former Smoker    Types: Cigarettes    Start date: 08/29/1979    Quit date: 11/04/2010  . Smokeless tobacco: Former Systems developer    Quit date: 11/19/2010     Comment: 1 to 1.5 packs per day  . Alcohol use No    Review of Systems Per HPI unless specifically indicated above     Objective:    BP 111/72   Pulse 78   Temp 98.8 F (37.1 C) (Oral)   Resp 16   Ht 6' (1.829 m)   Wt 236 lb (107 kg)   BMI 32.01 kg/m   Wt Readings from Last 3 Encounters:  10/23/16 236 lb (107 kg)  10/23/16 235 lb 12.8 oz (107 kg)  07/24/16 250 lb 9.6 oz (113.7 kg)    Physical Exam  Constitutional: He appears well-developed and well-nourished. No distress.  Well-appearing, comfortable, cooperative  HENT:  Head: Normocephalic and atraumatic.  Mouth/Throat: Oropharynx is clear and moist.  Cardiovascular: Normal rate and intact distal pulses.   Pulmonary/Chest: Effort normal.  Musculoskeletal: He exhibits no edema.  Neurological: He is alert.  Skin: Skin is warm and dry. He is not diaphoretic.  Psychiatric: His behavior is normal.  Well groomed, good eye contact, depressed mood but appropriate affect, normal speech and thoughts  Nursing note and vitals reviewed.  Results for orders placed or performed in visit on 10/23/16  POCT HgB A1C  Result Value Ref Range   Hemoglobin A1C 6.6       Assessment & Plan:   Problem List Items Addressed This Visit    Type 2 diabetes, controlled, with neuropathy ([place]) - Primary    Stable, well controlled, A1c 6.6 (last check 6.7) No known complications - complex lower extremity  neuropathy likely secondary to multiple back surgeries, and less likely DM neuropathy. Remains high risk for foot complications given significant lack of monofilament sensation bilaterally.  Plan: 1. Continue Metformin 1000mg  BID - refilled today 2. Refilled Lisinopril 5mg  daily - DM renal protection, BP is stable, consider combination pill with HCTZ in future, however may need to adjust dose, review BP meds next visit 3. Check BMET for f/u K, Cr on new start ACE (however 3 months ago, he did not follow-up when this was last ordered) 4. UTD vaccinations, declined Flu 5.UTD Ophthalmology exam 6. Continue ASA, statin 7. Follow-up 6 months, A1c - re-evaluate Lisinopril combination pill / BP, then next physical 07/2017      Relevant Medications   metFORMIN (GLUCOPHAGE) 1000 MG tablet   Other Relevant Orders   POCT HgB A1C (Completed)   Basic Metabolic Panel (BMET)   Insomnia    Worsening chronic problem, in setting of significant MDD, anxiety, on variety of treatments, concern polypharmacy. - Previously on Ambien, Tamzepam, Xanax. Currently on Seroquel - Followed by [facility], last visit today, per chart review seems to have used his sleeping medications more frequently and is out prior to 90 day refill, 11/15/16 - I advised him that I could not prescribe these medications, he was not requesting them from me today, and he understands will need to wait until able to fill at pharmacy - Additionally requesting to establish with alternative Psychiatry      Failed back syndrome of lumbar spine    Refilled baclofen to local pharmacy, waiting on mail order      Relevant Medications   baclofen (LIORESAL) 10 MG tablet   Depression    Chronic problem, in setting of significant anxiety, with insomnia - Currently followed by [facility], on Paxil, Seroquel - See behavioral health note and my note for details, I am concerned about polypharmacy and sedating medications, he did not agree with  prior tapers of the Xanax, and adjust insomnia medications by chart review - I emphasized that I am not planning on prescribe benzodiazepines or other controlled substance for sleep such as Ambien long-term, given his complex history. No rx provided today, he needs to follow-up with Psych on when rx are able to be filled at pharmacy 11/15/16 - Additionally requesting to establish with alternative Psychiatry, advised him that if he would like to locate psychiatry accepting new patients that would be covered by his insurance, he may provide the name to [place] and if needs referral we could work on arranging, otherwise may be self referral and he could schedule this and transition care from [facility] to new psychiatry if he chooses.      Anxiety    Chronic problem, in setting of significant MDD, with insomnia - Currently followed by [facility], on Paxil, Xanax - See behavioral health note and my note for details, I am concerned about polypharmacy and sedating medications, he did not agree with prior tapers of the Xanax by chart review - I emphasized that I am not planning on  prescribe benzodiazepines or other controlled substance for sleep such as Ambien long-term, given his complex history. No rx provided today, he needs to follow-up with Psych on when rx are able to be filled at pharmacy 11/15/16 - Additionally requesting to establish with alternative Psychiatry, advised him that if he would like to locate psychiatry accepting new patients that would be covered by his insurance, he may provide the name to [place] and if needs referral we could work on arranging, otherwise may be self referral and he could schedule this and transition care from [facility] to new psychiatry if he chooses.         Meds ordered this encounter  Medications  . metFORMIN (GLUCOPHAGE) 1000 MG tablet    Sig: Take 1 tablet (1,000 mg total) by mouth 2 (two) times daily with a meal.    Dispense:  180 tablet    Refill:  3  . baclofen  (LIORESAL) 10 MG tablet    Sig: Take 1 tablet (10 mg total) by mouth 4 (four) times daily.    Dispense:  120 each    Refill:  0      Follow up plan: Return in about 6 months (around 04/23/2017) for diabetes.  [name], DO [facility] 10/23/2016, 6:18 PM

## 2016-10-23 NOTE — Assessment & Plan Note (Signed)
Chronic problem, in setting of significant anxiety, with insomnia - Currently followed by [facility], on Paxil, Seroquel - See behavioral health note and my note for details, I am concerned about polypharmacy and sedating medications, he did not agree with prior tapers of the Xanax, and adjust insomnia medications by chart review - I emphasized that I am not planning on prescribe benzodiazepines or other controlled substance for sleep such as Ambien long-term, given his complex history. No rx provided today, he needs to follow-up with Psych on when rx are able to be filled at pharmacy 11/15/16 - Additionally requesting to establish with alternative Psychiatry, advised him that if he would like to locate psychiatry accepting new patients that would be covered by his insurance, he may provide the name to [place] and if needs referral we could work on arranging, otherwise may be self referral and he could schedule this and transition care from [facility] to new psychiatry if he chooses.

## 2016-10-30 ENCOUNTER — Encounter: Payer: Commercial Managed Care - HMO | Admitting: Physical Therapy

## 2016-11-11 ENCOUNTER — Encounter: Payer: Commercial Managed Care - HMO | Admitting: Physical Therapy

## 2016-11-12 DIAGNOSIS — E1140 Type 2 diabetes mellitus with diabetic neuropathy, unspecified: Secondary | ICD-10-CM | POA: Diagnosis not present

## 2016-11-13 LAB — BASIC METABOLIC PANEL
BUN/Creatinine Ratio: 16 (ref 9–20)
BUN: 13 mg/dL (ref 6–24)
CALCIUM: 10 mg/dL (ref 8.7–10.2)
CO2: 26 mmol/L (ref 18–29)
Chloride: 97 mmol/L (ref 96–106)
Creatinine, Ser: 0.82 mg/dL (ref 0.76–1.27)
GFR calc Af Amer: 114 mL/min/{1.73_m2} (ref >59)
GFR calc non Af Amer: 99 mL/min/{1.73_m2} (ref >59)
GLUCOSE: 154 mg/dL — AB (ref 65–99)
Potassium: 4.1 mmol/L (ref 3.5–5.2)
Sodium: 142 mmol/L (ref 134–144)

## 2016-11-19 ENCOUNTER — Other Ambulatory Visit: Payer: Self-pay | Admitting: Family Medicine

## 2016-11-19 DIAGNOSIS — M961 Postlaminectomy syndrome, not elsewhere classified: Secondary | ICD-10-CM

## 2016-11-22 ENCOUNTER — Other Ambulatory Visit: Payer: Self-pay | Admitting: Family Medicine

## 2016-11-22 ENCOUNTER — Other Ambulatory Visit: Payer: Self-pay | Admitting: Psychiatry

## 2016-11-22 DIAGNOSIS — M961 Postlaminectomy syndrome, not elsewhere classified: Secondary | ICD-10-CM

## 2016-11-25 DIAGNOSIS — G894 Chronic pain syndrome: Secondary | ICD-10-CM | POA: Diagnosis not present

## 2016-11-25 DIAGNOSIS — G8921 Chronic pain due to trauma: Secondary | ICD-10-CM | POA: Diagnosis not present

## 2016-11-25 DIAGNOSIS — M961 Postlaminectomy syndrome, not elsewhere classified: Secondary | ICD-10-CM | POA: Diagnosis not present

## 2016-12-10 ENCOUNTER — Telehealth: Payer: Self-pay

## 2016-12-10 NOTE — Telephone Encounter (Signed)
left message on doctor's line for #15 this will give patient enought to do until appt.

## 2016-12-10 NOTE — Telephone Encounter (Signed)
pt was called back because a [facility] was [facility] for a 90 day supply with one refill. pt should have plenty.  pt states she didn't know that it was [facility]. pt was advied to [facility]

## 2016-12-10 NOTE — Telephone Encounter (Signed)
pt called states that he does not have enough seroquel until his appt.

## 2016-12-15 ENCOUNTER — Other Ambulatory Visit: Payer: Self-pay | Admitting: Psychiatry

## 2016-12-17 ENCOUNTER — Ambulatory Visit
Admission: EM | Admit: 2016-12-17 | Discharge: 2016-12-17 | Disposition: A | Discharge status: Home / Self Care | Payer: Commercial Managed Care - HMO | Attending: Family Medicine | Admitting: Family Medicine

## 2016-12-17 DIAGNOSIS — J029 Acute pharyngitis, unspecified: Secondary | ICD-10-CM | POA: Diagnosis not present

## 2016-12-17 LAB — RAPID STREP SCREEN (MED CTR MEBANE ONLY): Streptococcus, Group A Screen (Direct): NEGATIVE

## 2016-12-17 MED ORDER — PREDNISONE 20 MG PO TABS: ORAL_TABLET | ORAL | 0 refills | Status: DC

## 2016-12-17 NOTE — Discharge Instructions (Signed)
You are  Being given prednisone for your severe sore throat and as we discussed this may increase your blood sugar which will need tight control. I will Give you only a few days worth hopefully in conjunction with  salt water gargles and lozenges to allow your throat to be less bothersome and enable you to take your oral medications. The results of the cultures should be available in 48 hours. If you run a high fever or the throat worsens go to the emergency room.

## 2016-12-17 NOTE — ED Triage Notes (Signed)
Patient complains of sore throat and swollen and can barely swallow. Patient had an unsteady gait back to triage. Patient states that symptoms started yesterday.

## 2016-12-17 NOTE — ED Provider Notes (Signed)
CSN: [id number]     Arrival date & time 12/17/16  1719 History   First MD Initiated Contact with Patient 12/17/16 2004     Chief Complaint  Patient presents with  . Sore Throat   (Consider location/radiation/quality/duration/timing/severity/associated sxs/prior Treatment) HPI  A 56 year old male diabetic Z[place]z with a 2 day history of sore throat that he states is so severe he can barely swallow. Complain that he has numerous pills to take every morning and he was barely able to get those down. Saturday attended the child's party he may have been exposed to germs oozing a sore throat. Not had fever or chills but has a temperature of 99.2 today. Pulse rate of 98. He said his voice is normal for him. He has been able to have a minimal amount of fluids.      Past Medical History:  Diagnosis Date  . ADHD (attention deficit hyperactivity disorder)   . Anxiety   . Diabetes mellitus, type II ([place])   . Heart disease    Past Surgical History:  Procedure Laterality Date  . SPINE SURGERY     Multiple surgeries, initial 2001, thoracic disc repair and recurrent revisions.  . THROAT SURGERY     Family History  Problem Relation Age of Onset  . Depression Mother   . Anxiety disorder Mother   . Diabetes Mother   . Heart failure Mother   . Heart attack Father   . Diabetes Sister   . Obesity Sister   . Drug abuse Brother   . Alcohol abuse Brother   . Depression Sister   . Anxiety disorder Sister   . Drug abuse Sister   . Anxiety disorder Sister   . Seizures Sister   . Diabetes Sister   . Depression Sister    Social History  Substance Use Topics  . Smoking status: Former Smoker    Types: Cigarettes    Start date: 08/29/1979    Quit date: 11/04/2010  . Smokeless tobacco: Former Systems developer    Quit date: 11/19/2010     Comment: 1 to 1.5 packs per day  . Alcohol use No    Review of Systems  Constitutional: Positive for activity change. Negative for chills, fatigue and fever.  HENT:  Positive for postnasal drip, sinus pressure and sore throat.   Respiratory: Negative for cough, wheezing and stridor.   All other systems reviewed and are negative.   Allergies  Benzoin; Morphine; and Penicillins  Home Medications   Prior to Admission medications   Medication Sig Start Date End Date Taking? Authorizing Provider  ALPRAZolam Duanne Moron) 0.5 MG tablet Take 1 tablet (0.5 mg total) by mouth 2 (two) times daily as needed for anxiety. 07/24/16  Yes Rainey Pines, MD  amLODipine (NORVASC) 5 MG tablet Take 1 tablet by mouth daily. 09/27/15 12/17/16 Yes Historical Provider, MD  atorvastatin (LIPITOR) 10 MG tablet TAKE 1 TABLET BY MOUTH  DAILY AT 2 PM. 07/30/16  Yes Olin Hauser, DO  baclofen (LIORESAL) 10 MG tablet TAKE 1 TABLET (10 MG TOTAL) BY MOUTH 4 (FOUR) TIMES DAILY. 11/22/16  Yes Alexander Devin Going, DO  Blood Glucose Monitoring Suppl ([place]) w/Device KIT  06/26/16  Yes Historical Provider, MD  buPROPion (WELLBUTRIN XL) 150 MG 24 hr tablet Take 1 tablet (150 mg total) by mouth daily. 10/23/16  Yes Rainey Pines, MD  BUTRANS 10 MCG/HR PTWK patch  08/05/16  Yes Historical Provider, MD  cholecalciferol (VITAMIN D) 400 units TABS tablet Take 400  Units by mouth.   Yes Historical Provider, MD  co-enzyme Q-10 30 MG capsule Take 30 mg by mouth 3 (three) times daily.   Yes Historical Provider, MD  cyanocobalamin 1000 MCG tablet Take 100 mcg by mouth daily.   Yes Historical Provider, MD  desonide (DESOWEN) 0.05 % cream Apply topically 2 (two) times daily as needed. 01/30/16  Yes Adline Potter, MD  desoximetasone (TOPICORT) 0.25 % cream Apply 1 application topically 2 (two) times daily as needed. 07/19/15  Yes Adline Potter, MD  Flaxseed, Linseed, (FLAXSEED OIL) 1000 MG CAPS Take 1 capsule (1,000 mg total) by mouth daily. 07/30/16  Yes Alexander J Karamalegos, DO  glucose blood (ONETOUCH VERIO) test strip Check blood sugar up to 3x daily. 07/18/16  Yes Alexander J  Karamalegos, DO  hydrochlorothiazide (HYDRODIURIL) 25 MG tablet Take 1 tablet (25 mg total) by mouth daily. 03/11/16  Yes Adline Potter, MD  hydrOXYzine (ATARAX/VISTARIL) 25 MG tablet Take by mouth.   Yes Historical Provider, MD  latanoprost (XALATAN) 0.005 % ophthalmic solution Place 1 drop into both eyes at bedtime. 09/01/14  Yes Historical Provider, MD  lisinopril (PRINIVIL,ZESTRIL) 5 MG tablet Take 1 tablet (5 mg total) by mouth daily. 07/18/16  Yes Alexander Devin Going, DO  meloxicam (MOBIC) 7.5 MG tablet Take 7.5 mg by mouth 2 (two) times daily.   Yes Historical Provider, MD  metFORMIN (GLUCOPHAGE) 1000 MG tablet Take 1 tablet (1,000 mg total) by mouth 2 (two) times daily with a meal. 10/23/16  Yes Olin Hauser, DO  Multiple Vitamin (MULTIVITAMIN) tablet Take 1 tablet by mouth daily.   Yes Historical Provider, MD  Omega-3 Fatty Acids (FISH OIL) 1200 MG CAPS Take 1 capsule (1,200 mg total) by mouth daily. 07/30/16  Yes Alexander J Karamalegos, DO  ondansetron (ZOFRAN) 4 MG tablet Take 1 tablet (4 mg total) by mouth every 8 (eight) hours as needed for nausea or vomiting. 01/22/16  Yes Adline Potter, MD  PARoxetine (PAXIL) 40 MG tablet Take 1 tablet (40 mg total) by mouth every morning. 10/23/16  Yes Rainey Pines, MD  Phosphatidylserine-DHA-EPA 100-19.5-6.5 MG CAPS Take 1 capsule by mouth daily. 08/06/16  Yes Alexander J Karamalegos, DO  QUEtiapine (SEROQUEL) 100 MG tablet Take 1 tablet (100 mg total) by mouth at bedtime. 10/23/16  Yes Rainey Pines, MD  tamsulosin (FLOMAX) 0.4 MG CAPS capsule Take 1 capsule (0.4 mg total) by mouth daily. 01/31/16  Yes Nickie Retort, MD  temazepam (RESTORIL) 15 MG capsule Take by mouth.   Yes Historical Provider, MD  traMADol (ULTRAM) 50 MG tablet Take 2 tablets by mouth 4 (four) times daily as needed. 05/16/15  Yes Historical Provider, MD  zolpidem (AMBIEN) 10 MG tablet Take 1 tablet (10 mg total) by mouth at bedtime. 07/24/16  Yes Rainey Pines, MD   predniSONE (DELTASONE) 20 MG tablet Take 1 tablets (20 mg) daily by mouth. Check blood sugars BID and adjust medicine accordingly. 12/17/16   Lorin Picket, PA-C   Meds Ordered and Administered this Visit  Medications - No data to display  BP 106/64 (BP Location: Left Arm)   Pulse 98   Temp 99.2 F (37.3 C) (Oral)   Resp 18   Ht 6' (1.829 m)   Wt 240 lb (108.9 kg)   BMI 32.55 kg/m  No data found.   Physical Exam  Constitutional: He is oriented to person, place, and time. He appears well-developed and well-nourished. No distress.  HENT:  Head: Normocephalic and atraumatic.  Right  Ear: External ear normal.  Left Ear: External ear normal.  Nose: Nose normal.  Mouth/Throat: Oropharynx is clear and moist. No oropharyngeal exudate.  Examination posterior pharynx is very difficult to visualize due to his high arching tongue.  No Exudate or petechiae are seen. He has no cervical adenopathy palpable.  Eyes: EOM are normal. Pupils are equal, round, and reactive to light. Right eye exhibits no discharge. Left eye exhibits no discharge.  Neck: Normal range of motion. Neck supple.  Pulmonary/Chest: Effort normal and breath sounds normal. No respiratory distress. He has no wheezes. He has no rales.  Musculoskeletal: Normal range of motion.  Lymphadenopathy:    He has no cervical adenopathy.  Neurological: He is alert and oriented to person, place, and time.  Skin: Skin is warm and dry. He is not diaphoretic.  Psychiatric: He has a normal mood and affect. His behavior is normal. Judgment and thought content normal.  Nursing note and vitals reviewed.   Urgent Care Course     Procedures (including critical care time)  Labs Review Labs Reviewed  RAPID STREP SCREEN (NOT AT [facility])  CULTURE, GROUP A STREP [facility])    Imaging Review No results found.   Visual Acuity Review  Right Eye Distance:   Left Eye Distance:   Bilateral Distance:    Right Eye Near:   Left Eye Near:     Bilateral Near:         MDM   1. Pharyngitis, unspecified etiology    Discharge Medication List as of 12/17/2016  8:29 PM    START taking these medications   Details  predniSONE (DELTASONE) 20 MG tablet Take 1 tablets (20 mg) daily by mouth. Check blood sugars BID and adjust medicine accordingly., Print      Plan: 1. Test/x-ray results and diagnosis reviewed with patient 2. rx as per orders; risks, benefits, potential side effects reviewed with patient 3. Recommend supportive treatment with [place] gargles or lozenges for comfort. I will start him on a very low-dose short course of prednisone for his pain is a diabetic and I told him he needs to keep close monitoring of his sugars just accordingly since the prednisone may cause it to rise. Cultures of the pharynx and should be available in 48 hours. At the present time it appears that he has a viral pharyngitis. If he has difficulty swallowing despite prednisone or runs high fevers or has any breast changes she should go immediately to the emergency department 4. F/u prn if symptoms worsen or don't improve     Lorin Picket, PA-C 12/17/16 2038

## 2016-12-20 LAB — CULTURE, GROUP A STREP (THRC)

## 2016-12-23 ENCOUNTER — Ambulatory Visit (INDEPENDENT_AMBULATORY_CARE_PROVIDER_SITE_OTHER): Payer: Commercial Managed Care - HMO | Admitting: Family Medicine

## 2016-12-23 ENCOUNTER — Encounter: Payer: Self-pay | Admitting: Family Medicine

## 2016-12-23 VITALS — BP 121/83 | HR 89 | Temp 98.7°F | Resp 16 | Ht 72.0 in | Wt 234.0 lb

## 2016-12-23 DIAGNOSIS — J019 Acute sinusitis, unspecified: Secondary | ICD-10-CM

## 2016-12-23 MED ORDER — BENZONATATE 100 MG PO CAPS: 100.0000 mg | ORAL_CAPSULE | Freq: Three times a day (TID) | ORAL | 0 refills | Status: DC | PRN

## 2016-12-23 MED ORDER — IPRATROPIUM BROMIDE 0.06 % NA SOLN: 2.0000 | Freq: Four times a day (QID) | NASAL | 0 refills | Status: DC

## 2016-12-23 MED ORDER — AMOXICILLIN-POT CLAVULANATE 875-125 MG PO TABS: 1.0000 | ORAL_TABLET | Freq: Two times a day (BID) | ORAL | 0 refills | Status: DC

## 2016-12-23 NOTE — Progress Notes (Signed)
Subjective:    Patient ID: Dale [name], male    DOB: 1961/02/24, 56 y.o.   MRN: [id number]  Dale [name] is a 55 y.o. male presenting on 12/23/2016 for Sore Throat (onset 9 days sore throat mucus dark yellowish mucus cough no fever )  Patient presents for a same day appointment.  HPI  ACUTE RHINOSINUSITIS / PHARYNGITIS: Reports symptoms started with sore throat about 9 days ago, then has worsened over course of past week with some chest congestion, productive cough, and still worsening sore throat with some exudates by report in throat. - Recently seen at [facility] on 12/17/16 for same symptoms (about 2 days after initial onset), rapid strep negative, and had throat culture done (now results are negative for GAS), he was given prednisone dosepak for 3 days for throat pain and swelling without any significant relief. No recent antibiotics. - Sick contact at home with wife having bronchitis onset about 3-4 days before his current illness, she was diagnosed with bronchitis, and has known COPD, she was treated with antibiotics - Admits some sweats with subjective fevers, R ear pain with sinus pain and pressure - Denies any documented fevers, chills, nausea, vomiting, abdominal pain, diarrhea   Social History  Substance Use Topics  . Smoking status: Former Smoker    Types: Cigarettes    Start date: 08/29/1979    Quit date: 11/04/2010  . Smokeless tobacco: Former Systems developer    Quit date: 11/19/2010     Comment: 1 to 1.5 packs per day  . Alcohol use No    Review of Systems Per HPI unless specifically indicated above     Objective:    BP 121/83   Pulse 89   Temp 98.7 F (37.1 C) (Oral)   Resp 16   Ht 6' (1.829 m)   Wt 234 lb (106.1 kg)   SpO2 95%   BMI 31.74 kg/m   Wt Readings from Last 3 Encounters:  12/23/16 234 lb (106.1 kg)  12/17/16 240 lb (108.9 kg)  10/23/16 236 lb (107 kg)    Physical Exam  Constitutional: He appears well-developed and well-nourished. No distress.    Mildly ill-appearing but non toxic, comfortable, cooperative  HENT:  Head: Normocephalic and atraumatic.  Frontal / maxillary sinuses non-tender. Nares with turbinate edema and congestion without purulence. Bilateral TMs clear without erythema, effusion or bulging. Oropharynx with posterior pharyngeal and uvula erythema and some mild edema with small exudates on uvula without asymmetry.  Eyes: Conjunctivae are normal. Right eye exhibits no discharge. Left eye exhibits no discharge.  Neck: Normal range of motion. Neck supple.  Non tender  Cardiovascular: Normal rate, regular rhythm, normal heart sounds and intact distal pulses.   No murmur heard. Pulmonary/Chest: Effort normal and breath sounds normal. No respiratory distress. He has no wheezes. He has no rales.  Good air movement.  Lymphadenopathy:    He has no cervical adenopathy.  Neurological: He is alert.  Skin: Skin is warm and dry. No rash noted. He is not diaphoretic. No erythema.  Psychiatric: His behavior is normal.  Nursing note and vitals reviewed.  I have personally reviewed the following lab results from 12/17/16.  Results for orders placed or performed during the hospital encounter of 12/17/16  Rapid strep screen  Result Value Ref Range   Streptococcus, Group A Screen (Direct) NEGATIVE NEGATIVE  Culture, group A strep  Result Value Ref Range   Specimen Description THROAT    Special Requests NONE Reflexed from [id number]  Culture      NO GROUP A STREP (S.PYOGENES) ISOLATED Performed at [facility], [place] [address] [place], [place] [address]    Report Status 12/20/2016 FINAL       Assessment & Plan:   Problem List Items Addressed This Visit    None    Visit Diagnoses    Acute rhinosinusitis    -  Primary  Consistent with acute rhinosinusitis with pharyngitis, likely initially viral URI possible allergic rhinitis component with worsening concern for bacterial infection, duration >1 week now 9+ days, in  setting of DM2 patient. - Recent rapid strep and GAS culture negative 6 days ago at [facility]  Plan: 1. Start Augmentin 875-125mg  PO BID x 10 days 2. Start Atrovent nasal spray decongestant 2 sprays in each nostril up to 4 times daily for 7 days 3. Start Tessalon Perls take 1 capsule up to 3 times a day as needed for cough 4. May take anti-histamines and flonase in future if needed 5. Supportive care with nasal saline OTC, hydration, OTC dayquil/nyquil, mucinex-DM for 7 days 6. Return criteria reviewed     Relevant Medications   ipratropium (ATROVENT) 0.06 % nasal spray   benzonatate (TESSALON) 100 MG capsule   amoxicillin-clavulanate (AUGMENTIN) 875-125 MG tablet      Meds ordered this encounter  Medications  . ipratropium (ATROVENT) 0.06 % nasal spray    Sig: Place 2 sprays into both nostrils 4 (four) times daily. For up to 5-7 days then stop.    Dispense:  15 mL    Refill:  0  . benzonatate (TESSALON) 100 MG capsule    Sig: Take 1 capsule (100 mg total) by mouth 3 (three) times daily as needed for cough.    Dispense:  30 capsule    Refill:  0  . amoxicillin-clavulanate (AUGMENTIN) 875-125 MG tablet    Sig: Take 1 tablet by mouth 2 (two) times daily.    Dispense:  20 tablet    Refill:  0      Follow up plan: Return in about 2 weeks (around 01/06/2017), or if symptoms worsen or fail to improve, for sinusitis.  [name], [facility] 12/23/2016, 2:29 PM

## 2016-12-23 NOTE — Patient Instructions (Signed)
Thank you for coming in to clinic today.  1. It sounds like you have a Sinusitis (Bacterial Infection) - this most likely started as an Upper Respiratory Virus that has settled into an infection. Allergies can also cause this. - Start Augmentin 1 pill twice daily (breakfast and dinner, with food and plenty of water) for 10 days, complete entire course, do not stop early even if feeling better - Start Atrovent nasal spray decongestant 2 sprays in each nostril up to 4 times daily for 7 days - Start Tessalon 1 capsule 3 times a day as needed for cough - Start OTC Mucinex-DM for cough and thin secretions for 1 week - Recommend to start using Nasal Saline spray multiple times a day to help flush out congestion and clear sinuses - Improve hydration by drinking plenty of clear fluids (water, gatorade) to reduce secretions and thin congestion - Congestion draining down throat can cause irritation. May try warm herbal tea with honey, cough drops - Can take Tylenol or Ibuprofen as needed for fevers  Recommend to start taking Tylenol Extra Strength 500mg  tabs - take 1 to 2 tabs per dose (max 1000mg ) every 6-8 hours for pain (take regularly, don't skip a dose for next 7 days), max 24 hour daily dose is 6 tablets or 3000mg . In the future you can repeat the same everyday Tylenol course for 1-2 weeks at a time.   Please schedule a follow-up appointment with Dr. Parks Ranger in 2 weeks as needed if sinusitis not improved or worsening  If you have any other questions or concerns, please feel free to call the clinic or send a message through [place]. You may also schedule an earlier appointment if necessary.  [name], DO [place]

## 2016-12-27 DIAGNOSIS — Z451 Encounter for adjustment and management of infusion pump: Secondary | ICD-10-CM | POA: Diagnosis not present

## 2016-12-27 DIAGNOSIS — Z79899 Other long term (current) drug therapy: Secondary | ICD-10-CM | POA: Diagnosis not present

## 2016-12-30 ENCOUNTER — Ambulatory Visit: Payer: 59 | Admitting: Psychiatry

## 2016-12-30 ENCOUNTER — Other Ambulatory Visit: Payer: Self-pay | Admitting: Family Medicine

## 2016-12-30 ENCOUNTER — Encounter: Payer: Self-pay | Admitting: Family Medicine

## 2016-12-30 DIAGNOSIS — J019 Acute sinusitis, unspecified: Secondary | ICD-10-CM

## 2016-12-30 DIAGNOSIS — J0110 Acute frontal sinusitis, unspecified: Secondary | ICD-10-CM

## 2016-12-30 MED ORDER — LEVOFLOXACIN 500 MG PO TABS: 500.0000 mg | ORAL_TABLET | Freq: Every day | ORAL | 0 refills | Status: DC

## 2016-12-30 MED ORDER — BENZONATATE 100 MG PO CAPS: 100.0000 mg | ORAL_CAPSULE | Freq: Three times a day (TID) | ORAL | 0 refills | Status: DC | PRN

## 2016-12-31 ENCOUNTER — Ambulatory Visit: Payer: Self-pay

## 2017-01-11 ENCOUNTER — Other Ambulatory Visit: Payer: Self-pay | Admitting: Family Medicine

## 2017-01-11 ENCOUNTER — Other Ambulatory Visit: Payer: Self-pay | Admitting: Psychiatry

## 2017-01-13 ENCOUNTER — Telehealth: Payer: Self-pay

## 2017-01-13 NOTE — Telephone Encounter (Signed)
received a request for refill on alprazolam and zolpidem

## 2017-01-20 ENCOUNTER — Ambulatory Visit (INDEPENDENT_AMBULATORY_CARE_PROVIDER_SITE_OTHER): Payer: 59 | Admitting: Psychiatry

## 2017-01-20 ENCOUNTER — Encounter: Payer: Self-pay | Admitting: Psychiatry

## 2017-01-20 VITALS — BP 123/84 | HR 103 | Temp 99.0°F | Wt 237.2 lb

## 2017-01-20 DIAGNOSIS — F331 Major depressive disorder, recurrent, moderate: Secondary | ICD-10-CM

## 2017-01-20 DIAGNOSIS — F1394 Sedative, hypnotic or anxiolytic use, unspecified with sedative, hypnotic or anxiolytic-induced mood disorder: Secondary | ICD-10-CM

## 2017-01-20 DIAGNOSIS — G894 Chronic pain syndrome: Secondary | ICD-10-CM | POA: Diagnosis not present

## 2017-01-20 MED ORDER — BUPROPION HCL ER (XL) 150 MG PO TB24: 150.0000 mg | ORAL_TABLET | Freq: Every day | ORAL | 1 refills | Status: DC

## 2017-01-20 MED ORDER — ALPRAZOLAM 0.5 MG PO TABS: 0.5000 mg | ORAL_TABLET | Freq: Two times a day (BID) | ORAL | 1 refills | Status: DC | PRN

## 2017-01-20 MED ORDER — ZOLPIDEM TARTRATE 5 MG PO TABS: 5.0000 mg | ORAL_TABLET | Freq: Every day | ORAL | 1 refills | Status: DC

## 2017-01-20 MED ORDER — QUETIAPINE FUMARATE 100 MG PO TABS: 150.0000 mg | ORAL_TABLET | Freq: Every day | ORAL | 1 refills | Status: DC

## 2017-01-20 MED ORDER — PAROXETINE HCL 40 MG PO TABS: 40.0000 mg | ORAL_TABLET | ORAL | 1 refills | Status: DC

## 2017-01-20 NOTE — Telephone Encounter (Signed)
Pt is being see on 01-20-17

## 2017-01-20 NOTE — Progress Notes (Signed)
Psychiatric MD/ NP Progress Note  Patient Identification: Dale [name] MRN:  [id number] Date of Evaluation:  01/20/2017 Referral Source: [facility].  Chief Complaint:   Chief Complaint    Follow-up; Medication Refill     Visit Diagnosis:    ICD-9-CM ICD-10-CM   1. MDD (major depressive disorder), recurrent episode, moderate (HCC) 296.32 F33.1   2. Sedative, hypnotic or anxiolytic-induced mood disorder (HCC) 292.84 F13.94    E980.2    3. Chronic pain syndrome 338.4 G89.4    Diagnosis:   Patient Active Problem List   Diagnosis Date Noted  . MCI (mild cognitive impairment) with memory loss [G31.84] 08/06/2016  . Peripheral neuropathy ([place]) [G62.9] 08/06/2016  . Anxiety [F41.9] 07/17/2016  . Insomnia [G47.00] 07/17/2016  . Pain medication agreement [Z02.89] 01/30/2016  . Eczema [L30.9] 01/22/2016  . Benign essential tremor [G25.0] 10/03/2015  . Glaucoma [H40.9] 06/27/2015  . B12 deficiency [E53.8] 06/27/2015  . 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 Billowing mitral valve [I34.1] 06/26/2015  . History of prolonged Q-T interval on ECG [Z87.898] 06/26/2015  . 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 Depression [F32.9] 06/26/2015  . Low testosterone [E34.9] 06/26/2015  . Obesity (BMI 30.0-34.9) [E66.9] 06/26/2015  . CAD (coronary artery disease), native coronary artery [I25.10] 06/26/2015  . 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 Fatty infiltration of liver [K76.0] 10/21/2014  . Psoriasis [L40.9] 07/22/2014  . Acid reflux [K21.9] 07/22/2014  . Type 2 diabetes, controlled, with neuropathy ([place]) [E11.40] 06/22/2014  . HLD (hyperlipidemia) [E78.5] 06/22/2014  . Essential (primary) hypertension [I10] 06/22/2014  . 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 Chronic pain associated with significant psychosocial dysfunction [G89.4] 04/29/2014  . History of surgical procedure [Z98.890] 04/29/2014  . Polypharmacy  [Z79.899] 09/09/2013  . Pain medication agreement signed [Z02.89] 09/09/2013  . Other long term (current) drug therapy [Z79.899] 11/20/1998   History of Present Illness:    Patient is a 56 year-old  male who presented for the follow-up appointment. He appeared tired  during the interview. Patient reported that " we to be serious about his medications" Later on he reported that he has been taking Xanax 1 mg 4 times daily before he started seeing me. He stated that he wants his Dose to be increased again as he feels that he continues to have severe anxiety. Patient reported that he recently lost a four-legged family member and has been feeling very anxious. He reported that he is not feeling well on the Vistaril and it has not been helpful. Patient is currently taking Ambien Seroquel and temazepam to help him sleep at night. He remained focus on going higher on the dose of the Xanax. He reported that he has filled his temazepam yesterday and it has been prescribed by his primary care physician. I advised patient that if she will stop taking the temazepam then we can go higher on the dose of the Xanax as we cannot be prescribing him higher doses of the sedating medications at this time. He was not happy with this discussion. He reported that he is also taking the Paxil and the bupropion. Discussed about adjusting his medications but he is not receptive to medication changes at this time.  He currently denied having any suicidal ideations or plans. He denied having any perceptual disturbances at this time.We discussed at length about her different medications options and he agreed with the plan  Elements:  Location:  depression and anxiety. Associated Signs/Symptoms: Depression  Symptoms:  depressed mood, insomnia, psychomotor retardation, fatigue, anxiety, loss of energy/fatigue, (Hypo) Manic Symptoms:  none Anxiety Symptoms:  Excessive Worry, Social Anxiety, Psychotic Symptoms:  none PTSD  Symptoms: Negative NA  Past Medical History:  Past Medical History:  Diagnosis Date  . ADHD (attention deficit hyperactivity disorder)   . Anxiety   . Diabetes mellitus, type II ([place])   . Heart disease     Past Surgical History:  Procedure Laterality Date  . SPINE SURGERY     Multiple surgeries, initial 2001, thoracic disc repair and recurrent revisions.  . THROAT SURGERY     Family History:  Family History  Problem Relation Age of Onset  . Depression Mother   . Anxiety disorder Mother   . Diabetes Mother   . Heart failure Mother   . Heart attack Father   . Diabetes Sister   . Obesity Sister   . Drug abuse Brother   . Alcohol abuse Brother   . Depression Sister   . Anxiety disorder Sister   . Drug abuse Sister   . Anxiety disorder Sister   . Seizures Sister   . Diabetes Sister   . Depression Sister    Social History:   Social History   Social History  . Marital status: Married    Spouse name: N/A  . Number of children: N/A  . Years of education: 1 yr of college   Occupational History  . Disabled    Social History Main Topics  . Smoking status: Former Smoker    Types: Cigarettes    Start date: 08/29/1979    Quit date: 11/04/2010  . Smokeless tobacco: Former Systems developer    Quit date: 11/19/2010     Comment: 1 to 1.5 packs per day  . Alcohol use No  . Drug use: No  . Sexual activity: Not Currently   Other Topics Concern  . None   Social History Narrative  . None   Additional Social History: Married x 28 years. Retied from [employer]. Wife is Freight forwarder at [place] [employer] [place] [place]. Has a daughter, 1 yo. Pt is on  disability due to back pain and depression.   Musculoskeletal: Strength & Muscle Tone: decreased Gait & Station: normal Patient leans: N/A  Psychiatric Specialty Exam: Medication Refill     ROS  Blood pressure 123/84, pulse (!) 103, temperature 99 F (37.2 C), temperature source Oral, weight 237 lb 3.2 oz (107.6 kg).Body mass index is 32.17 kg/m.  General  Appearance: Casual  Eye Contact:  Fair  Speech:  Clear and Coherent  Volume:  Normal  Mood:  Anxious and Depressed  Affect:  Blunt  Thought Process:  Coherent and Goal Directed  Orientation:  Full (Time, Place, and Person)  Thought Content:  WDL  Suicidal Thoughts:  No  Homicidal Thoughts:  No  Memory:  Immediate;   Fair  Judgement:  Fair  Insight:  Fair  Psychomotor Activity:  Psychomotor Retardation  Concentration:  Fair  Recall:  [employer] of Knowledge:Fair  Language: Fair  Akathisia:  No  Handed:  Right  AIMS (if indicated):    Assets:  Communication Skills Desire for Improvement Social Support  ADL's:  Intact  Cognition: WNL  Sleep:  4-5   Is the patient at risk to self?  No. Has the patient been a risk to self in the past 6 months?  No. Has the patient been a risk to self within the distant past?  No. Is the patient a risk  to others?  No. Has the patient been a risk to others in the past 6 months?  No. Has the patient been a risk to others within the distant past?  No.  Allergies:   Allergies  Allergen Reactions  . Benzoin Other (See Comments)    blisters  . Morphine Other (See Comments)   Current Medications: Current Outpatient Prescriptions  Medication Sig Dispense Refill  . ALPRAZolam (XANAX) 0.5 MG tablet Take 1 tablet (0.5 mg total) by mouth 2 (two) times daily as needed for anxiety. 60 tablet 1  . atorvastatin (LIPITOR) 10 MG tablet TAKE 1 TABLET BY MOUTH  DAILY AT 2 PM. 90 tablet 3  . baclofen (LIORESAL) 10 MG tablet TAKE 1 TABLET (10 MG TOTAL) BY MOUTH 4 (FOUR) TIMES DAILY. 120 tablet 5  . benzonatate (TESSALON) 100 MG capsule Take 1 capsule (100 mg total) by mouth 3 (three) times daily as needed for cough. 30 capsule 0  . Blood Glucose Monitoring Suppl ([place] FLEX SYSTEM) w/Device KIT     . buPROPion (WELLBUTRIN XL) 150 MG 24 hr tablet Take 1 tablet (150 mg total) by mouth daily. 90 tablet 1  . BUTRANS 10 MCG/HR PTWK patch     .  cholecalciferol (VITAMIN D) 400 units TABS tablet Take 400 Units by mouth.    . co-enzyme Q-10 30 MG capsule Take 30 mg by mouth 3 (three) times daily.    . cyanocobalamin 1000 MCG tablet Take 100 mcg by mouth daily.    Marland Kitchen desonide (DESOWEN) 0.05 % cream Apply topically 2 (two) times daily as needed. 60 g 2  . desoximetasone (TOPICORT) 0.25 % cream Apply 1 application topically 2 (two) times daily as needed. 60 g 2  . Flaxseed, Linseed, (FLAXSEED OIL) 1000 MG CAPS Take 1 capsule (1,000 mg total) by mouth daily.  0  . glucose blood (ONETOUCH VERIO) test strip Check blood sugar up to 3x daily. 100 each 5  . hydrochlorothiazide (HYDRODIURIL) 25 MG tablet TAKE 1 TABLET BY MOUTH  DAILY 90 tablet 1  . hydrOXYzine (ATARAX/VISTARIL) 25 MG tablet Take by mouth.    Marland Kitchen ipratropium (ATROVENT) 0.06 % nasal spray Place 2 sprays into both nostrils 4 (four) times daily. For up to 5-7 days then stop. 15 mL 0  . latanoprost (XALATAN) 0.005 % ophthalmic solution Place 1 drop into both eyes at bedtime.    Marland Kitchen levofloxacin (LEVAQUIN) 500 MG tablet Take 1 tablet (500 mg total) by mouth daily. For 7 days 7 tablet 0  . lisinopril (PRINIVIL,ZESTRIL) 5 MG tablet Take 1 tablet (5 mg total) by mouth daily. 90 tablet 3  . meloxicam (MOBIC) 7.5 MG tablet Take 7.5 mg by mouth 2 (two) times daily.    . metFORMIN (GLUCOPHAGE) 1000 MG tablet Take 1 tablet (1,000 mg total) by mouth 2 (two) times daily with a meal. 180 tablet 3  . Multiple Vitamin (MULTIVITAMIN) tablet Take 1 tablet by mouth daily.    . Omega-3 Fatty Acids (FISH OIL) 1200 MG CAPS Take 1 capsule (1,200 mg total) by mouth daily.    . ondansetron (ZOFRAN) 4 MG tablet Take 1 tablet (4 mg total) by mouth every 8 (eight) hours as needed for nausea or vomiting. 30 tablet 2  . PARoxetine (PAXIL) 40 MG tablet Take 1 tablet (40 mg total) by mouth every morning. 90 tablet 1  . Phosphatidylserine-DHA-EPA 100-19.5-6.5 MG CAPS Take 1 capsule by mouth daily.    . predniSONE  (DELTASONE) 20 MG tablet Take  1 tablets (20 mg) daily by mouth. Check blood sugars BID and adjust medicine accordingly. 3 tablet 0  . QUEtiapine (SEROQUEL) 100 MG tablet Take 1.5 tablets (150 mg total) by mouth at bedtime. 135 tablet 1  . tamsulosin (FLOMAX) 0.4 MG CAPS capsule Take 1 capsule (0.4 mg total) by mouth daily. 30 capsule 11  . temazepam (RESTORIL) 15 MG capsule Take by mouth.    . traMADol (ULTRAM) 50 MG tablet Take 2 tablets by mouth 4 (four) times daily as needed.  2  . zolpidem (AMBIEN) 5 MG tablet Take 1 tablet (5 mg total) by mouth at bedtime. 30 tablet 1  . amLODipine (NORVASC) 5 MG tablet Take 1 tablet by mouth daily.     No current facility-administered medications for this visit.     Previous Psychotropic Medications: Taking most of the medications since 2001-2002.  wellbutrin since 2008    Substance Abuse History in the last 12 months:  No.  Consequences of Substance Abuse: Negative NA  Medical Decision Making:  Review of Psycho-Social Stressors (1)  Treatment Plan Summary: Medication management   Discussed with patient about his medications. I will adjust his medications as follows. Refilled Paxil 40 mg and Bupropion as prescribed. I will refill Xanax 0.5 mg by mouth twice a day 30 day supply with 1 refill. I will refill Ambien 5 mg by mouth daily at bedtime 30 day supply with 1 refill Titrate the dose of Seroquel 150 mg by mouth daily at bedtime-90 day supply with 1 refill He has refilled his temazepam yesterday 90 day supply  Follow up 2  months or earlier depending on his symptoms   More than 50% of the time spent in psychoeducation, counseling and coordination of care.    This note was generated in part or whole with voice recognition software. Voice regonition is usually quite accurate but there are transcription errors that can and very often do occur. I apologize for any typographical errors that were not detected and corrected.    Rainey Pines, MD    3/19/20183:50 PM

## 2017-01-20 NOTE — Telephone Encounter (Signed)
Pt need to make appt

## 2017-01-21 ENCOUNTER — Encounter: Payer: Self-pay | Admitting: Family Medicine

## 2017-01-21 DIAGNOSIS — L309 Dermatitis, unspecified: Secondary | ICD-10-CM

## 2017-01-21 MED ORDER — DESOXIMETASONE 0.25 % EX CREA: 1.0000 "application " | TOPICAL_CREAM | Freq: Two times a day (BID) | CUTANEOUS | 2 refills | Status: DC | PRN

## 2017-01-30 DIAGNOSIS — G3184 Mild cognitive impairment, so stated: Secondary | ICD-10-CM | POA: Diagnosis not present

## 2017-01-30 DIAGNOSIS — R200 Anesthesia of skin: Secondary | ICD-10-CM | POA: Diagnosis not present

## 2017-02-19 ENCOUNTER — Telehealth: Payer: Self-pay | Admitting: Urology

## 2017-02-19 DIAGNOSIS — N401 Enlarged prostate with lower urinary tract symptoms: Secondary | ICD-10-CM

## 2017-02-19 MED ORDER — TAMSULOSIN HCL 0.4 MG PO CAPS: 0.4000 mg | ORAL_CAPSULE | Freq: Every day | ORAL | 6 refills | Status: DC

## 2017-02-19 NOTE — Telephone Encounter (Signed)
[facility] [place] [phone] called about Pt's Tamsulosin.  Please give them a call.

## 2017-02-19 NOTE — Telephone Encounter (Signed)
Pt medication was refilled.

## 2017-02-20 ENCOUNTER — Encounter: Payer: Self-pay | Admitting: Family Medicine

## 2017-02-28 DIAGNOSIS — G251 Drug-induced tremor: Secondary | ICD-10-CM | POA: Diagnosis not present

## 2017-03-03 DIAGNOSIS — G8921 Chronic pain due to trauma: Secondary | ICD-10-CM | POA: Diagnosis not present

## 2017-03-03 DIAGNOSIS — G894 Chronic pain syndrome: Secondary | ICD-10-CM | POA: Diagnosis not present

## 2017-03-03 DIAGNOSIS — M961 Postlaminectomy syndrome, not elsewhere classified: Secondary | ICD-10-CM | POA: Diagnosis not present

## 2017-03-19 ENCOUNTER — Ambulatory Visit: Payer: 59 | Admitting: Psychiatry

## 2017-04-15 DIAGNOSIS — S0011XA Contusion of right eyelid and periocular area, initial encounter: Secondary | ICD-10-CM | POA: Diagnosis not present

## 2017-04-15 DIAGNOSIS — S098XXA Other specified injuries of head, initial encounter: Secondary | ICD-10-CM | POA: Diagnosis not present

## 2017-04-15 DIAGNOSIS — S0591XA Unspecified injury of right eye and orbit, initial encounter: Secondary | ICD-10-CM | POA: Diagnosis not present

## 2017-04-15 DIAGNOSIS — R220 Localized swelling, mass and lump, head: Secondary | ICD-10-CM | POA: Diagnosis not present

## 2017-04-15 DIAGNOSIS — S069X9A Unspecified intracranial injury with loss of consciousness of unspecified duration, initial encounter: Secondary | ICD-10-CM | POA: Diagnosis not present

## 2017-05-01 DIAGNOSIS — Z79899 Other long term (current) drug therapy: Secondary | ICD-10-CM | POA: Diagnosis not present

## 2017-05-01 DIAGNOSIS — Z451 Encounter for adjustment and management of infusion pump: Secondary | ICD-10-CM | POA: Diagnosis not present

## 2017-05-20 DIAGNOSIS — G251 Drug-induced tremor: Secondary | ICD-10-CM | POA: Diagnosis not present

## 2017-05-26 ENCOUNTER — Ambulatory Visit
Admission: EM | Admit: 2017-05-26 | Discharge: 2017-05-26 | Disposition: A | Discharge status: Home / Self Care | Payer: 59 | Attending: Family Medicine | Admitting: Family Medicine

## 2017-05-26 DIAGNOSIS — R112 Nausea with vomiting, unspecified: Secondary | ICD-10-CM | POA: Diagnosis not present

## 2017-05-26 DIAGNOSIS — R197 Diarrhea, unspecified: Secondary | ICD-10-CM

## 2017-05-26 LAB — COMPREHENSIVE METABOLIC PANEL
ALBUMIN: 4.3 g/dL (ref 3.5–5.0)
ALT: 41 U/L (ref 17–63)
AST: 37 U/L (ref 15–41)
Alkaline Phosphatase: 77 U/L (ref 38–126)
Anion gap: 11 (ref 5–15)
BUN: 21 mg/dL — ABNORMAL HIGH (ref 6–20)
CO2: 28 mmol/L (ref 22–32)
Calcium: 9.3 mg/dL (ref 8.9–10.3)
Chloride: 97 mmol/L — ABNORMAL LOW (ref 101–111)
Creatinine, Ser: 0.78 mg/dL (ref 0.61–1.24)
GFR calc Af Amer: >60 mL/min (ref >60)
GFR calc non Af Amer: >60 mL/min (ref >60)
GLUCOSE: 132 mg/dL — AB (ref 65–99)
POTASSIUM: 3.5 mmol/L (ref 3.5–5.1)
SODIUM: 136 mmol/L (ref 135–145)
Total Bilirubin: 0.9 mg/dL (ref 0.3–1.2)
Total Protein: 7.7 g/dL (ref 6.5–8.1)

## 2017-05-26 LAB — CBC WITH DIFFERENTIAL/PLATELET
BASOS PCT: 0 %
Basophils Absolute: 0 10*3/uL (ref 0–0.1)
EOS ABS: 0.2 10*3/uL (ref 0–0.7)
Eosinophils Relative: 2 %
HCT: 41.4 % (ref 40.0–52.0)
Hemoglobin: 14 g/dL (ref 13.0–18.0)
Lymphocytes Relative: 18 %
Lymphs Abs: 1.8 10*3/uL (ref 1.0–3.6)
MCH: 29.3 pg (ref 26.0–34.0)
MCHC: 33.9 g/dL (ref 32.0–36.0)
MCV: 86.6 fL (ref 80.0–100.0)
MONO ABS: 0.9 10*3/uL (ref 0.2–1.0)
MONOS PCT: 9 %
NEUTROS PCT: 71 %
Neutro Abs: 7.2 10*3/uL — ABNORMAL HIGH (ref 1.4–6.5)
Platelets: 195 10*3/uL (ref 150–440)
RBC: 4.78 MIL/uL (ref 4.40–5.90)
RDW: 14.2 % (ref 11.5–14.5)
WBC: 10.2 10*3/uL (ref 3.8–10.6)

## 2017-05-26 MED ORDER — ONDANSETRON 8 MG PO TBDP: 8.0000 mg | ORAL_TABLET | Freq: Three times a day (TID) | ORAL | 0 refills | Status: DC | PRN

## 2017-05-26 NOTE — ED Provider Notes (Signed)
[facility]    CSN: [id number] Arrival date & time: 05/26/17  1409     History   Chief Complaint Chief Complaint  Patient presents with  . Diarrhea    HPI Dale [name] is a 56 y.o. male.   56 yo male with a c/o watery diarrhea for 2 days since eating out fast food Saturday night. Patient states symptoms started hours after eating and had vomiting as well. Vomiting resolved yesterday but diarrhea has continued. Denies any blood in the stool, fevers, chills. Also had RUQ abdominal pain which has now resolved.    The history is provided by the patient.  Diarrhea    Past Medical History:  Diagnosis Date  . ADHD (attention deficit hyperactivity disorder)   . Anxiety   . Diabetes mellitus, type II ([name])   . Heart disease     Patient Active Problem List   Diagnosis Date Noted  . MCI (mild cognitive impairment) with memory loss 08/06/2016  . Peripheral neuropathy 08/06/2016  . Anxiety 07/17/2016  . Insomnia 07/17/2016  . Pain medication agreement 01/30/2016  . Eczema 01/22/2016  . Benign essential tremor 10/03/2015  . Glaucoma 06/27/2015  . B12 deficiency 06/27/2015  . Cataplexy and narcolepsy 06/26/2015  . Billowing mitral valve 06/26/2015  . History of prolonged Q-T interval on ECG 06/26/2015  . Herniation of nucleus pulposus 06/26/2015  . Depression 06/26/2015  . Low testosterone 06/26/2015  . Obesity (BMI 30.0-34.9) 06/26/2015  . CAD (coronary artery disease), native coronary artery 06/26/2015  . 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 Fatty infiltration of liver 10/21/2014  . Psoriasis 07/22/2014  . Acid reflux 07/22/2014  . Type 2 diabetes, controlled, with neuropathy ([place]) 06/22/2014  . HLD (hyperlipidemia) 06/22/2014  . Essential (primary) hypertension 06/22/2014  . Failed back syndrome of lumbar spine 04/29/2014  . Chronic pain associated with significant psychosocial dysfunction 04/29/2014  . History of surgical procedure 04/29/2014    . Polypharmacy 09/09/2013  . Pain medication agreement signed 09/09/2013  . Other long term (current) drug therapy 11/20/1998    Past Surgical History:  Procedure Laterality Date  . SPINE SURGERY     Multiple surgeries, initial 2001, thoracic disc repair and recurrent revisions.  . THROAT SURGERY         Home Medications    Prior to Admission medications   Medication Sig Start Date End Date Taking? Authorizing Provider  ALPRAZolam Duanne Moron) 0.5 MG tablet Take 1 tablet (0.5 mg total) by mouth 2 (two) times daily as needed for anxiety. 01/20/17   Rainey Pines, MD  amLODipine (NORVASC) 5 MG tablet Take 1 tablet by mouth daily. 09/27/15 12/17/16  [provider]  atorvastatin (LIPITOR) 10 MG tablet TAKE 1 TABLET BY MOUTH  DAILY AT 2 PM. 07/30/16   Karamalegos, Devonne Doughty, DO  baclofen (LIORESAL) 10 MG tablet TAKE 1 TABLET (10 MG TOTAL) BY MOUTH 4 (FOUR) TIMES DAILY. 11/22/16   Karamalegos, Devonne Doughty, DO  benzonatate (TESSALON) 100 MG capsule Take 1 capsule (100 mg total) by mouth 3 (three) times daily as needed for cough. 12/30/16   Karamalegos, Devonne Doughty, DO  Blood Glucose Monitoring Suppl ([place]) w/Device KIT  06/26/16   [provider]  buPROPion (WELLBUTRIN XL) 150 MG 24 hr tablet Take 1 tablet (150 mg total) by mouth daily. 01/20/17   Rainey Pines, MD  BUTRANS 10 MCG/HR PTWK patch  08/05/16   [provider]  cholecalciferol (VITAMIN D) 400 units TABS tablet Take  400 Units by mouth.    [provider]  co-enzyme Q-10 30 MG capsule Take 30 mg by mouth 3 (three) times daily.    [provider]  cyanocobalamin 1000 MCG tablet Take 100 mcg by mouth daily.    [provider]  desonide (DESOWEN) 0.05 % cream Apply topically 2 (two) times daily as needed. 01/30/16   Plonk, Gwyndolyn Saxon, MD  desoximetasone (TOPICORT) 0.25 % cream Apply 1 application topically 2 (two) times daily as needed. 01/21/17   Karamalegos, Devonne Doughty, DO   Flaxseed, Linseed, (FLAXSEED OIL) 1000 MG CAPS Take 1 capsule (1,000 mg total) by mouth daily. 07/30/16   Karamalegos, Devonne Doughty, DO  glucose blood (ONETOUCH VERIO) test strip Check blood sugar up to 3x daily. 07/18/16   Karamalegos, Devonne Doughty, DO  hydrochlorothiazide (HYDRODIURIL) 25 MG tablet TAKE 1 TABLET BY MOUTH  DAILY 01/13/17   Plonk, Gwyndolyn Saxon, MD  hydrOXYzine (ATARAX/VISTARIL) 25 MG tablet Take by mouth.    [provider]  ipratropium (ATROVENT) 0.06 % nasal spray Place 2 sprays into both nostrils 4 (four) times daily. For up to 5-7 days then stop. 12/23/16   Karamalegos, Devonne Doughty, DO  latanoprost (XALATAN) 0.005 % ophthalmic solution Place 1 drop into both eyes at bedtime. 09/01/14   [provider]  levofloxacin (LEVAQUIN) 500 MG tablet Take 1 tablet (500 mg total) by mouth daily. For 7 days 12/30/16   Olin Hauser, DO  lisinopril (PRINIVIL,ZESTRIL) 5 MG tablet Take 1 tablet (5 mg total) by mouth daily. 07/18/16   Karamalegos, Devonne Doughty, DO  meloxicam (MOBIC) 7.5 MG tablet Take 7.5 mg by mouth 2 (two) times daily.    [provider]  metFORMIN (GLUCOPHAGE) 1000 MG tablet Take 1 tablet (1,000 mg total) by mouth 2 (two) times daily with a meal. 10/23/16   Karamalegos, Devonne Doughty, DO  Multiple Vitamin (MULTIVITAMIN) tablet Take 1 tablet by mouth daily.    [provider]  Omega-3 Fatty Acids (FISH OIL) 1200 MG CAPS Take 1 capsule (1,200 mg total) by mouth daily. 07/30/16   Karamalegos, Devonne Doughty, DO  ondansetron (ZOFRAN ODT) 8 MG disintegrating tablet Take 1 tablet (8 mg total) by mouth every 8 (eight) hours as needed for nausea or vomiting. 05/26/17   Norval Gable, MD  ondansetron (ZOFRAN) 4 MG tablet Take 1 tablet (4 mg total) by mouth every 8 (eight) hours as needed for nausea or vomiting. 01/22/16   Plonk, Gwyndolyn Saxon, MD  PARoxetine (PAXIL) 40 MG tablet Take 1 tablet (40 mg total) by mouth every morning. 01/20/17   Rainey Pines, MD   Phosphatidylserine-DHA-EPA 100-19.5-6.5 MG CAPS Take 1 capsule by mouth daily. 08/06/16   Karamalegos, Devonne Doughty, DO  predniSONE (DELTASONE) 20 MG tablet Take 1 tablets (20 mg) daily by mouth. Check blood sugars BID and adjust medicine accordingly. 12/17/16   Lorin Picket, PA-C  QUEtiapine (SEROQUEL) 100 MG tablet Take 1.5 tablets (150 mg total) by mouth at bedtime. 01/20/17   Rainey Pines, MD  tamsulosin (FLOMAX) 0.4 MG CAPS capsule Take 1 capsule (0.4 mg total) by mouth daily. 02/19/17   Ardis Hughs, MD  temazepam (RESTORIL) 15 MG capsule Take by mouth.    [provider]  traMADol (ULTRAM) 50 MG tablet Take 2 tablets by mouth 4 (four) times daily as needed. 05/16/15   [provider]  zolpidem (AMBIEN) 5 MG tablet Take 1 tablet (5 mg total) by mouth at bedtime. 01/20/17   Rainey Pines, MD  Family History Family History  Problem Relation Age of Onset  . Depression Mother   . Anxiety disorder Mother   . Diabetes Mother   . Heart failure Mother   . Heart attack Father   . Diabetes Sister   . Obesity Sister   . Drug abuse Brother   . Alcohol abuse Brother   . Depression Sister   . Anxiety disorder Sister   . Drug abuse Sister   . Anxiety disorder Sister   . Seizures Sister   . Diabetes Sister   . Depression Sister     Social History Social History  Substance Use Topics  . Smoking status: Former Smoker    Types: Cigarettes    Start date: 08/29/1979    Quit date: 11/04/2010  . Smokeless tobacco: Former Systems developer    Quit date: 11/19/2010     Comment: 1 to 1.5 packs per day  . Alcohol use No     Allergies   Benzoin and Morphine   Review of Systems Review of Systems  Gastrointestinal: Positive for diarrhea.     Physical Exam Triage Vital Signs ED Triage Vitals  Enc Vitals Group     BP 05/26/17 1428 123/86     Pulse Rate 05/26/17 1428 92     Resp 05/26/17 1428 18     Temp 05/26/17 1428 99.1 F (37.3 C)     Temp Source 05/26/17 1428 Oral      SpO2 05/26/17 1428 93 %     Weight 05/26/17 1429 230 lb (104.3 kg)     Height 05/26/17 1429 6' (1.829 m)     Head Circumference --      Peak Flow --      Pain Score 05/26/17 1429 7     Pain Loc --      Pain Edu? --      Excl. in [place]? --    No data found.   Updated Vital Signs BP 123/86 (BP Location: Left Arm)   Pulse 92   Temp 99.1 F (37.3 C) (Oral)   Resp 18   Ht 6' (1.829 m)   Wt 230 lb (104.3 kg)   SpO2 93%   BMI 31.19 kg/m   Visual Acuity Right Eye Distance:   Left Eye Distance:   Bilateral Distance:    Right Eye Near:   Left Eye Near:    Bilateral Near:     Physical Exam  Constitutional: He is oriented to person, place, and time. He appears well-developed and well-nourished. No distress.  Abdominal: Soft. Bowel sounds are normal. He exhibits no distension and no mass. There is no tenderness. There is no rebound and no guarding.  Neurological: He is alert and oriented to person, place, and time.  Skin: No rash noted. He is not diaphoretic.  Nursing note and vitals reviewed.    UC Treatments / Results  Labs (all labs ordered are listed, but only abnormal results are displayed) Labs Reviewed  CBC WITH DIFFERENTIAL/PLATELET - Abnormal; Notable for the following:       Result Value   Neutro Abs 7.2 (*)    All other components within normal limits  COMPREHENSIVE METABOLIC PANEL - Abnormal; Notable for the following:    Chloride 97 (*)    Glucose, Bld 132 (*)    BUN 21 (*)    All other components within normal limits    EKG  EKG Interpretation None       Radiology No results found.  Procedures Procedures (  including critical care time)  Medications Ordered in UC Medications - No data to display   Initial Impression / Assessment and Plan / UC Course  I have reviewed the triage vital signs and the nursing notes.  Pertinent labs & imaging results that were available during my care of the patient were reviewed by me and considered in my  medical decision making (see chart for details).      Final Clinical Impressions(s) / UC Diagnoses   Final diagnoses:  Nausea vomiting and diarrhea    New Prescriptions New Prescriptions   ONDANSETRON (ZOFRAN ODT) 8 MG DISINTEGRATING TABLET    Take 1 tablet (8 mg total) by mouth every 8 (eight) hours as needed for nausea or vomiting.   1. Lab results and diagnosis reviewed with patient 2. rx as per orders above; reviewed possible side effects, interactions, risks and benefits  3. Recommend supportive treatment with fluids,otc Imodium 4. Follow-up prn if symptoms worsen or don't improve   Norval Gable, MD 05/26/17 [phone]

## 2017-05-26 NOTE — ED Notes (Signed)
Pt given p.o. Challenge with diet ginger ale

## 2017-05-26 NOTE — ED Triage Notes (Signed)
Pt ate a cheeseburger on Saturday evening. 3 hours later he started with watery diarrhea. Had n/v until yesterday but has resolved. Pain in RUQ abd 7/10. Describes as constant sharp pain

## 2017-06-11 DIAGNOSIS — G8921 Chronic pain due to trauma: Secondary | ICD-10-CM | POA: Diagnosis not present

## 2017-06-11 DIAGNOSIS — G894 Chronic pain syndrome: Secondary | ICD-10-CM | POA: Diagnosis not present

## 2017-06-11 DIAGNOSIS — M961 Postlaminectomy syndrome, not elsewhere classified: Secondary | ICD-10-CM | POA: Diagnosis not present

## 2017-07-04 DIAGNOSIS — G894 Chronic pain syndrome: Secondary | ICD-10-CM | POA: Diagnosis not present

## 2017-07-04 DIAGNOSIS — Z7982 Long term (current) use of aspirin: Secondary | ICD-10-CM | POA: Diagnosis not present

## 2017-07-04 DIAGNOSIS — G8921 Chronic pain due to trauma: Secondary | ICD-10-CM | POA: Diagnosis not present

## 2017-07-04 DIAGNOSIS — M961 Postlaminectomy syndrome, not elsewhere classified: Secondary | ICD-10-CM | POA: Diagnosis not present

## 2017-07-21 ENCOUNTER — Ambulatory Visit (INDEPENDENT_AMBULATORY_CARE_PROVIDER_SITE_OTHER): Payer: 59 | Admitting: Family Medicine

## 2017-07-21 ENCOUNTER — Encounter: Payer: Self-pay | Admitting: Family Medicine

## 2017-07-21 VITALS — BP 119/75 | HR 84 | Temp 98.4°F | Resp 16 | Ht 72.0 in | Wt 234.0 lb

## 2017-07-21 DIAGNOSIS — I10 Essential (primary) hypertension: Secondary | ICD-10-CM | POA: Diagnosis not present

## 2017-07-21 DIAGNOSIS — E782 Mixed hyperlipidemia: Secondary | ICD-10-CM | POA: Diagnosis not present

## 2017-07-21 DIAGNOSIS — F5101 Primary insomnia: Secondary | ICD-10-CM

## 2017-07-21 DIAGNOSIS — G47411 Narcolepsy with cataplexy: Secondary | ICD-10-CM

## 2017-07-21 DIAGNOSIS — Z125 Encounter for screening for malignant neoplasm of prostate: Secondary | ICD-10-CM | POA: Diagnosis not present

## 2017-07-21 DIAGNOSIS — E669 Obesity, unspecified: Secondary | ICD-10-CM | POA: Diagnosis not present

## 2017-07-21 DIAGNOSIS — T402X5A Adverse effect of other opioids, initial encounter: Secondary | ICD-10-CM | POA: Insufficient documentation

## 2017-07-21 DIAGNOSIS — Z0000 Encounter for general adult medical examination without abnormal findings: Secondary | ICD-10-CM | POA: Diagnosis not present

## 2017-07-21 DIAGNOSIS — F331 Major depressive disorder, recurrent, moderate: Secondary | ICD-10-CM | POA: Diagnosis not present

## 2017-07-21 DIAGNOSIS — G603 Idiopathic progressive neuropathy: Secondary | ICD-10-CM

## 2017-07-21 DIAGNOSIS — E1140 Type 2 diabetes mellitus with diabetic neuropathy, unspecified: Secondary | ICD-10-CM | POA: Diagnosis not present

## 2017-07-21 DIAGNOSIS — G894 Chronic pain syndrome: Secondary | ICD-10-CM

## 2017-07-21 DIAGNOSIS — K5903 Drug induced constipation: Secondary | ICD-10-CM | POA: Diagnosis not present

## 2017-07-21 LAB — POCT GLYCOSYLATED HEMOGLOBIN (HGB A1C): Hemoglobin A1C: 6.5 — AB (ref ?–5.7)

## 2017-07-21 NOTE — Progress Notes (Signed)
Subjective:    Patient ID: Dale [name], male    DOB: 1961-07-12, 56 y.o.   MRN: [id number]  Dale [name] is a 56 y.o. male presenting on 07/21/2017 for Annual Exam (obtw pt exhausted have no energy sleepy all the time several months )   HPI   Here for Annual physical, will return for labs.  Specialists: Psychiatry - Dr Sherri Rad [facility]) Neurology - Dr Jennings Books ([place]) Pain Management - Dr Harlin Heys / Trudee Grip, PharmD CPP [facility]) Urology - [facility]  CHRONIC DM, Type 2: Reports no concerns Meds: Metformin 1057m BID Reports good compliance. Tolerating well w/o side-effects Currently on ACEi low dose Lifestyle: - Diet (improved diet, low sugar foods, limited carbs)  - Exercise (limited due to pain and neuropathy) - Sister and mother diabetic - [facility], due for repeat Denies hypoglycemia  Chronic Pain / Peripheral Neuropathy - Last visit with me 10/2016 for similar problem, see prior notes for background information. 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, currently remains on intrathecal pump hydromorphone 0.838mday, Meloxicam 7.54m77mID, Baclofen 17m101mD, has had gradual decrease of intrathecal pump medicine and regimen over months, tolerating well with plans to remove pump in 1-2 months, and no plans for replacement or traditional opiate therapy based on the record, also was advised to gradually decline meloxicam and baclofen - Today patient reports no new concerns, describes persistent neuropathy affecting bilateral feet - [facility], limited relief on various treatments in past including Gabapentin, Lyrica (limited by cost and other) - Admits constipation, secondary to chronic opiate with pump,  has tried [employer] takes stool softener OTC, has not tried Miralax yet  Major Depression / Anxiety / Insomnia: - Last visit with me 10/23/16, for follow-up visit for same problem, see prior notes for background information. - Interval update with has been mostly managed by other specialists, including [facility] for both neuropathy and addressing issues of polypharmacy and anxiety/insomnia, also managed by different Psychiatry since my last visit, no longer [facility], [facility] - Today patient reports overall feels fairly well on current medications. His primary concern is poor sleep as previously discussed with me and other specialists. Prior concern with polypharmacy and sedating medications, however neurology has gradually weaned him off most sedating medications and other sleeping medications - Taking Paxil 40mg56m(instead of AM, which helps him sleep better now), Wellbutrin XL 150mg 354my - Now takes some Advil PM occasionally. Also recently started Melatonin - Taking Xanax 1mg TI38mow, off Temazepam, Ambien, Gabapentin, Seroquel - Prior history also on Hydroxyzine, chart review of Trazodone but does not recall specifically - Admits goes to bed after midnight, fall asleep by 0100, wakes up around 0530 to 0600 let dogs out, falls back asleep until 11 to 1200 noon - he has had prior sleep study PSG in past, reported negative for OSA - He does express concern for history of narcolepsy and that he did best few years ago on a rx "amphetamine" medication  Additional PMH - asking about urinary difficulty symptoms, sometimes difficulty voiding, is on Flomax 0.4mg dai58mfor BPH, followed by [facility], he plans to return to them  Health Maintenance: -  Due Flu Shot, declined today  No flowsheet data found. PHQ declined today, he is followed by Psychiatry, declines repeat PHQ  No flowsheet data found.  Past Medical  History:  Diagnosis Date  . ADHD (attention deficit hyperactivity disorder)   . Anxiety   . Diabetes mellitus, type II ([place])   . Heart disease    Past Surgical History:  Procedure Laterality Date  . SPINE SURGERY     Multiple surgeries, initial 2001, thoracic disc repair and recurrent revisions.  . THROAT SURGERY     Social History   Social History  . Marital status: Married    Spouse name: N/A  . Number of children: N/A  . Years of education: 1 yr of college   Occupational History  . Disabled    Social History Main Topics  . Smoking status: Former Smoker    Types: Cigarettes    Start date: 08/29/1979    Quit date: 11/04/2010  . Smokeless tobacco: Former Systems developer    Quit date: 11/19/2010     Comment: 1 to 1.5 packs per day  . Alcohol use No  . Drug use: No  . Sexual activity: Not Currently   Other Topics Concern  . Not on file   Social History Narrative  . No narrative on file   Family History  Problem Relation Age of Onset  . Depression Mother   . Anxiety disorder Mother   . Diabetes Mother   . Heart failure Mother   . Heart attack Father   . Diabetes Sister   . Obesity Sister   . Drug abuse Brother   . Alcohol abuse Brother   . Depression Sister   . Anxiety disorder Sister   . Drug abuse Sister   . Anxiety disorder Sister   . Seizures Sister   . Diabetes Sister   . Depression Sister    Current Outpatient Prescriptions on File Prior to Visit  Medication Sig  . atorvastatin (LIPITOR) 10 MG tablet TAKE 1 TABLET BY MOUTH  DAILY AT 2 PM.  . Blood Glucose Monitoring Suppl ([place] FLEX SYSTEM) w/Device KIT   . buPROPion (WELLBUTRIN XL) 150 MG 24 hr tablet Take 1 tablet (150 mg total) by mouth daily.  Marland Kitchen BUTRANS 10 MCG/HR PTWK patch   . cholecalciferol (VITAMIN D) 400 units TABS tablet Take 400 Units by mouth.  . co-enzyme Q-10 30 MG capsule Take 30 mg by mouth 3 (three) times daily.  Marland Kitchen desonide (DESOWEN) 0.05 % cream Apply topically 2 (two) times daily  as needed.  . desoximetasone (TOPICORT) 0.25 % cream Apply 1 application topically 2 (two) times daily as needed.  . Flaxseed, Linseed, (FLAXSEED OIL) 1000 MG CAPS Take 1 capsule (1,000 mg total) by mouth daily.  Marland Kitchen glucose blood (ONETOUCH VERIO) test strip Check blood sugar up to 3x daily.  . hydrochlorothiazide (HYDRODIURIL) 25 MG tablet TAKE 1 TABLET BY MOUTH  DAILY  . latanoprost (XALATAN) 0.005 % ophthalmic solution Place 1 drop into both eyes at bedtime.  Marland Kitchen lisinopril (PRINIVIL,ZESTRIL) 5 MG tablet Take 1 tablet (5 mg total) by mouth daily.  . metFORMIN (GLUCOPHAGE) 1000 MG tablet Take 1 tablet (1,000 mg total) by mouth 2 (two) times daily with a meal.  . Multiple Vitamin (MULTIVITAMIN) tablet Take 1 tablet by mouth daily.  . Omega-3 Fatty Acids (FISH OIL) 1200 MG CAPS Take 1 capsule (1,200 mg total) by mouth daily.  Marland Kitchen PARoxetine (PAXIL) 40 MG tablet Take 1 tablet (40 mg total) by  mouth every morning.  . tamsulosin (FLOMAX) 0.4 MG CAPS capsule Take 1 capsule (0.4 mg total) by mouth daily.  Marland Kitchen amLODipine (NORVASC) 5 MG tablet Take 1 tablet by mouth daily.  . Phosphatidylserine-DHA-EPA 100-19.5-6.5 MG CAPS Take 1 capsule by mouth daily.   No current facility-administered medications on file prior to visit.     Review of Systems  Constitutional: Negative for activity change, appetite change, chills, diaphoresis, fatigue, fever and unexpected weight change.  HENT: Negative for congestion, hearing loss and sinus pressure.   Eyes: Negative for visual disturbance.  Respiratory: Negative for apnea, cough, chest tightness, shortness of breath and wheezing.   Cardiovascular: Negative for chest pain, palpitations and leg swelling.  Gastrointestinal: Positive for constipation. Negative for abdominal pain, anal bleeding, blood in stool, diarrhea, nausea and vomiting.  Endocrine: Negative for cold intolerance and polyuria.  Genitourinary: Positive for difficulty urinating. Negative for decreased  urine volume, dysuria, frequency, hematuria, testicular pain and urgency.  Musculoskeletal: Negative for arthralgias and neck pain.  Skin: Negative for rash.  Allergic/Immunologic: Negative for environmental allergies.  Neurological: Positive for numbness (bilateral feet, unchanged). Negative for dizziness, weakness, light-headedness and headaches.  Hematological: Negative for adenopathy.  Psychiatric/Behavioral: Positive for dysphoric mood and sleep disturbance. Negative for agitation and behavioral problems. The patient is not nervous/anxious.    Per HPI unless specifically indicated above     Objective:    BP 119/75   Pulse 84   Temp 98.4 F (36.9 C) (Oral)   Resp 16   Ht 6' (1.829 m)   Wt 234 lb (106.1 kg)   BMI 31.74 kg/m   Wt Readings from Last 3 Encounters:  07/21/17 234 lb (106.1 kg)  05/26/17 230 lb (104.3 kg)  12/23/16 234 lb (106.1 kg)    Physical Exam  Constitutional: He is oriented to person, place, and time. He appears well-developed and well-nourished. No distress.  Well-appearing, comfortable, cooperative  HENT:  Head: Normocephalic and atraumatic.  Mouth/Throat: Oropharynx is clear and moist.  Eyes: Pupils are equal, round, and reactive to light. Conjunctivae and EOM are normal. Right eye exhibits no discharge. Left eye exhibits no discharge.  Neck: Normal range of motion. Neck supple. No thyromegaly present.  Cardiovascular: Normal rate, regular rhythm, normal heart sounds and intact distal pulses.   No murmur heard. Pulmonary/Chest: Effort normal and breath sounds normal. No respiratory distress. He has no wheezes. He has no rales.  Abdominal: Soft. Bowel sounds are normal. He exhibits no distension and no mass. There is no tenderness.  Genitourinary:  Genitourinary Comments: Deferred DRE  Musculoskeletal: Normal range of motion. He exhibits no edema or tenderness.  Upper / Lower Extremities: - Normal muscle tone, strength bilateral upper extremities 5/5,  lower extremities 5/5  Lymphadenopathy:    He has no cervical adenopathy.  Neurological: He is alert and oriented to person, place, and time.  Distal sensation intact to light touch ABOVE ankles, only mild intact sensation to touch on feet, select areas  Skin: Skin is warm and dry. No rash noted. He is not diaphoretic. No erythema.  Psychiatric: He has a normal mood and affect. His behavior is normal.  Well groomed, good eye contact, normal speech and thoughts  Nursing note and vitals reviewed.    Diabetic Foot Exam - Simple   Simple Foot Form Diabetic Foot exam was performed with the following findings:  Yes 07/21/2017  2:53 PM  Visual Inspection No deformities, no ulcerations, no other skin breakdown bilaterally:  Yes Sensation Testing  See comments:  Yes Pulse Check Posterior Tibialis and Dorsalis pulse intact bilaterally:  Yes Comments Bilateral monofilament sensation testing dramatically reduced plantar aspect of both feet, only sensation to pressure heel and mid foot localized, dorsal aspect reduced monofilament. Improved sensation above ankles bilateral.     Results for orders placed or performed in visit on 07/21/17  POCT HgB A1C  Result Value Ref Range   Hemoglobin A1C 6.5 (A) 5.7      Assessment & Plan:   Problem List Items Addressed This Visit    Type 2 diabetes, controlled, with neuropathy ([place])    Well-controlled DM with A1c 6.5 (stable from 6.6) Complications - peripheral neuropathy (also secondary to multiple back surgeries, with nerve damage)  Plan:  1. Continue current therapy - Metformin 1075m BID 2. Encourage improved lifestyle - low carb, low sugar diet, reduce portion size, continue improving regular exercise 3. Check CBG, bring log to next visit for review 4. Continue ACEi, Statin 5. DM Foot exam done today / Advised to schedule DM ophtho exam, send record 6. Follow-up 6 months A1c, labs      Relevant Orders   POCT HgB A1C (Completed)    Therapeutic opioid-induced constipation (OIC)    Recommend regular miralax dosing May resume prior movantik if need in future      Peripheral neuropathy    Stable, chronic problem multifactorial with post laminectomy syndrome bilateral foot numbness, most strongly affecting L5 dermatome, also with DM neuropathy likely contributing - Failed multiple therapies, including gabapentin and lyrica. Limited options now - [facility] - Reviewed several other potential med options such as TCA, Duloxetine, may discuss with Neuro vs Psych       Relevant Medications   ALPRAZolam (XANAX) 1 MG tablet   Obesity (BMI 30.0-34.9)    BMI >31, some improved wt, with fluctuation. Overall improved diet/lifestyle Encourage continue improved diet and exercise as tolerated      Moderate episode of recurrent major depressive disorder (HCC)    Stable chronic mood disorder with depression Followed by Psychiatry CBC Dr MGeanie KenningOn Paxil 436mdaily in PM, Wellbutrin XL 15044maily, Xanax 1mg13mD Prior other medications, now limited regimen less polypharmacy Consider adjust meds possible Duloxetine/Cymbalta or SNRI for pain, less likely benefit from TCA, advised him to discuss this with Psychiatry soon      Relevant Medications   ALPRAZolam (XANAX) 1 MG tablet   Other Relevant Orders   TSH   Insomnia    Worsening chronic problem multifactorial with chronic high risk meds, polypharmacy, mental health problems mood disorder, sleep hygiene among other issues. - Reassurance now on less meds, seems more primary insomnia than polypharmacy, however still on chronic hydromorphone intrathecal pump - Off Ambien, Temazepam, Seroquel, Hydroxyzine, Trazodone - On Xanax 1mg 23m, SSRI, Wellbutrin  Plan: 1. I advised patient that I have limited medication options for him, he should continue with Neurology and Psychiatry follow-up to discuss next options, consider other med options possibly Mirtazapine, maybe TCA  for neuropathy as well, otherwise limited options, and would consider Sleep Medicine referral, also he has declined repeat sleep study or OSA eval      HLD (hyperlipidemia)    Uncontrolled cholesterol on statin and some improved diet but still limited exercise Last lipid panel 07/2016 Calculated ASCVD 10 yr risk score >8%  Plan: 1. Check fasting lipids in 2 days 2. Continue current meds - Atorvastatin 10mg 40mnow - may adjust dose 3. Future consider ASA 81mg f15m  secondary ASCVD risk reduction 4. Encourage improved lifestyle - low carb/cholesterol, reduce portion size, continue improving regular exercise      Relevant Orders   Lipid panel   TSH   Essential (primary) hypertension    Well-controlled HTN - Home BP readings none available  No known complications / history of CAD   Plan:  1. Continue current BP regimen - Amlodipine 38m daily, Lisinopril 568mdaily, HCTZ 2562maily 2. Encourage improved lifestyle - low sodium diet, gradually increase regular exercise 3. Start monitor BP outside office, bring readings to next visit, if persistently >140/90 or new symptoms notify office sooner 4. Follow-up q 6 months      Chronic pain associated with significant psychosocial dysfunction    Stable, controlled [facility] with current plans to continue titrate down intrathecal hydromorphone dosing and remove pump 1-2 months, titrate other meds      Cataplexy and narcolepsy    Possible component to chronic sleep disorder with fatigue daytime sleepiness, with prior negative PSG by report. - Follow-up med discussion with neuro/psychiatry, consider referral to Sleep Medicine Specialist in future, consider Dr JamNehemiah Settle [place]    Other Visit Diagnoses    Annual physical exam    -  Primary   Relevant Orders   Lipid panel   Screening for prostate cancer       Relevant Orders   PSA, Total with Reflex to PSA, Free        Follow up plan: Return in about  6 months (around 01/18/2018) for A1c, DM, Neuropathy, Mood/Insomnia.  Follow-up 2 days for fasting lab only.  AleNobie PutnamO [facility] 07/21/2017, 11:59 PM

## 2017-07-21 NOTE — Patient Instructions (Addendum)
Thank you for coming to the clinic today.  1.  Please discuss insomnia and sleep with your Psychiatrist.  You may need an alternative sleeping medicine or discuss this further - possibly Mirtazapine, Trazodone.  Or can adjust medication for neuropathy - consider switch Paxil to Cymbalta or Duloxetine. Also consider TCA with Amitriptyline or Nortriptyline.  ------------------ If insomnia not improved or no other treatments offered, then let me know and I can refer you to a Sleep Specialist, most likely Dr Maxwell Caul in [facility])  Follow-up with [facility] for urinary symptoms , we already checked PSA -------------------------------------  DUE for FASTING BLOOD WORK (no food or drink after midnight before the lab appointment, only water or coffee without cream/sugar on the morning of)  SCHEDULE "Lab Only" visit in the morning at the clinic for lab draw in 1 WEEKS  - Make sure Lab Only appointment is at about 1 week before your next appointment, so that results will be available  For Lab Results, once available within 2-3 days of blood draw, you can can log in to MyChart online to view your results and a brief explanation. Also, we can discuss results at next follow-up visit.  Please schedule a Follow-up Appointment to: Return in about 6 months (around 01/18/2018) for A1c, DM, Neuropathy, Mood/Insomnia.  If you have any other questions or concerns, please feel free to call the clinic or send a message through [place]. You may also schedule an earlier appointment if necessary.  Additionally, you may be receiving a survey about your experience at our clinic within a few days to 1 week by e-mail or mail. We value your feedback.  [name], DO [place]

## 2017-07-21 NOTE — Assessment & Plan Note (Signed)
Recommend regular miralax dosing May resume prior movantik if need in future

## 2017-07-21 NOTE — Assessment & Plan Note (Signed)
Stable, controlled Followed by [facility] with current plans to continue titrate down intrathecal hydromorphone dosing and remove pump 1-2 months, titrate other meds

## 2017-07-21 NOTE — Assessment & Plan Note (Signed)
BMI >31, some improved wt, with fluctuation. Overall improved diet/lifestyle Encourage continue improved diet and exercise as tolerated 

## 2017-07-21 NOTE — Assessment & Plan Note (Signed)
Well-controlled HTN - Home BP readings none available  No known complications / history of CAD   Plan:  1. Continue current BP regimen - Amlodipine 5mg  daily, Lisinopril 5mg  daily, HCTZ 25mg  daily 2. Encourage improved lifestyle - low sodium diet, gradually increase regular exercise 3. Start monitor BP outside office, bring readings to next visit, if persistently >140/90 or new symptoms notify office sooner 4. Follow-up q 6 months

## 2017-07-21 NOTE — Assessment & Plan Note (Addendum)
Worsening chronic problem multifactorial with chronic high risk meds, polypharmacy, mental health problems mood disorder, sleep hygiene among other issues. - Reassurance now on less meds, seems more primary insomnia than polypharmacy, however still on chronic hydromorphone intrathecal pump - Off Ambien, Temazepam, Seroquel, Hydroxyzine, Trazodone - On Xanax 1mg  TID, SSRI, Wellbutrin  Plan: 1. I advised patient that I have limited medication options for him, he should continue with Neurology and Psychiatry follow-up to discuss next options, consider other med options possibly Mirtazapine, maybe TCA for neuropathy as well, otherwise limited options, and would consider Sleep Medicine referral, also he has declined repeat sleep study or OSA eval

## 2017-07-21 NOTE — Assessment & Plan Note (Signed)
Possible component to chronic sleep disorder with fatigue daytime sleepiness, with prior negative PSG by report. - Follow-up med discussion with neuro/psychiatry, consider referral to Sleep Medicine Specialist in future, consider Dr Nehemiah Settle in [place]

## 2017-07-21 NOTE — Assessment & Plan Note (Signed)
Uncontrolled cholesterol on statin and some improved diet but still limited exercise Last lipid panel 07/2016 Calculated ASCVD 10 yr risk score >8%  Plan: 1. Check fasting lipids in 2 days 2. Continue current meds - Atorvastatin 10mg  for now - may adjust dose 3. Future consider ASA 81mg  for secondary ASCVD risk reduction 4. Encourage improved lifestyle - low carb/cholesterol, reduce portion size, continue improving regular exercise

## 2017-07-21 NOTE — Assessment & Plan Note (Signed)
Well-controlled DM with A1c 6.5 (stable from 6.6) Complications - peripheral neuropathy (also secondary to multiple back surgeries, with nerve damage)  Plan:  1. Continue current therapy - Metformin 1000mg  BID 2. Encourage improved lifestyle - low carb, low sugar diet, reduce portion size, continue improving regular exercise 3. Check CBG, bring log to next visit for review 4. Continue ACEi, Statin 5. DM Foot exam done today / Advised to schedule DM ophtho exam, send record 6. Follow-up 6 months A1c, labs

## 2017-07-21 NOTE — Assessment & Plan Note (Addendum)
Stable, chronic problem multifactorial with post laminectomy syndrome bilateral foot numbness, most strongly affecting L5 dermatome, also with DM neuropathy likely contributing - Failed multiple therapies, including gabapentin and lyrica. Limited options now - Followed by [facility] - Reviewed several other potential med options such as TCA, Duloxetine, may discuss with Neuro vs Psych

## 2017-07-21 NOTE — Assessment & Plan Note (Signed)
Stable chronic mood disorder with depression Followed by Psychiatry CBC Dr Geanie Kenning On Paxil 40mg  daily in PM, Wellbutrin XL 150mg  daily, Xanax 1mg  TID Prior other medications, now limited regimen less polypharmacy Consider adjust meds possible Duloxetine/Cymbalta or SNRI for pain, less likely benefit from TCA, advised him to discuss this with Psychiatry soon

## 2017-07-23 ENCOUNTER — Other Ambulatory Visit: Payer: Self-pay

## 2017-08-04 LAB — LIPID PANEL
CHOLESTEROL: 144 mg/dL (ref <200)
HDL: 39 mg/dL — AB (ref >40)
LDL CHOLESTEROL (CALC): 73 mg/dL
Non-HDL Cholesterol (Calc): 105 mg/dL (calc) (ref <130)
Total CHOL/HDL Ratio: 3.7 (calc) (ref <5.0)
Triglycerides: 220 mg/dL — ABNORMAL HIGH (ref <150)

## 2017-08-04 LAB — TSH: TSH: 1.12 mIU/L (ref 0.40–4.50)

## 2017-08-04 LAB — PSA, TOTAL WITH REFLEX TO PSA, FREE: PSA, Total: 0.2 ng/mL (ref <=4.0)

## 2017-08-08 ENCOUNTER — Other Ambulatory Visit: Payer: Self-pay

## 2017-08-08 DIAGNOSIS — E785 Hyperlipidemia, unspecified: Secondary | ICD-10-CM

## 2017-08-08 DIAGNOSIS — E1140 Type 2 diabetes mellitus with diabetic neuropathy, unspecified: Secondary | ICD-10-CM

## 2017-08-08 MED ORDER — HYDROCHLOROTHIAZIDE 25 MG PO TABS: 25.0000 mg | ORAL_TABLET | Freq: Every day | ORAL | 1 refills | Status: DC

## 2017-08-08 MED ORDER — ATORVASTATIN CALCIUM 10 MG PO TABS: ORAL_TABLET | ORAL | 3 refills | Status: DC

## 2017-08-08 MED ORDER — LISINOPRIL 5 MG PO TABS: 5.0000 mg | ORAL_TABLET | Freq: Every day | ORAL | 3 refills | Status: DC

## 2017-08-20 ENCOUNTER — Other Ambulatory Visit: Payer: Self-pay | Admitting: Family Medicine

## 2017-08-20 DIAGNOSIS — L309 Dermatitis, unspecified: Secondary | ICD-10-CM

## 2017-08-20 DIAGNOSIS — G251 Drug-induced tremor: Secondary | ICD-10-CM | POA: Diagnosis not present

## 2017-08-26 ENCOUNTER — Other Ambulatory Visit: Payer: Self-pay | Admitting: Neurology

## 2017-08-26 DIAGNOSIS — G251 Drug-induced tremor: Secondary | ICD-10-CM

## 2017-08-26 DIAGNOSIS — R410 Disorientation, unspecified: Secondary | ICD-10-CM

## 2017-08-26 DIAGNOSIS — R413 Other amnesia: Secondary | ICD-10-CM

## 2017-09-03 ENCOUNTER — Ambulatory Visit
Admission: RE | Admit: 2017-09-03 | Discharge: 2017-09-03 | Disposition: A | Discharge status: Home / Self Care | Payer: 59 | Source: Ambulatory Visit | Attending: Neurology | Admitting: Neurology

## 2017-09-03 DIAGNOSIS — R410 Disorientation, unspecified: Secondary | ICD-10-CM

## 2017-09-03 DIAGNOSIS — R42 Dizziness and giddiness: Secondary | ICD-10-CM | POA: Diagnosis not present

## 2017-09-03 DIAGNOSIS — G251 Drug-induced tremor: Secondary | ICD-10-CM

## 2017-09-03 DIAGNOSIS — R413 Other amnesia: Secondary | ICD-10-CM

## 2017-09-03 LAB — POCT I-STAT CREATININE: CREATININE: 0.7 mg/dL (ref 0.61–1.24)

## 2017-09-03 MED ORDER — GADOBENATE DIMEGLUMINE 529 MG/ML IV SOLN
20.0000 mL | Freq: Once | INTRAVENOUS | Status: AC | PRN
Start: 2017-09-03 — End: 2017-09-03
  Administered 2017-09-03: 20 mL via INTRAVENOUS

## 2017-10-06 DIAGNOSIS — G8921 Chronic pain due to trauma: Secondary | ICD-10-CM | POA: Diagnosis not present

## 2017-10-06 DIAGNOSIS — M961 Postlaminectomy syndrome, not elsewhere classified: Secondary | ICD-10-CM | POA: Diagnosis not present

## 2017-10-06 DIAGNOSIS — Z9889 Other specified postprocedural states: Secondary | ICD-10-CM | POA: Diagnosis not present

## 2017-10-09 DIAGNOSIS — E876 Hypokalemia: Secondary | ICD-10-CM | POA: Diagnosis not present

## 2017-10-09 DIAGNOSIS — R079 Chest pain, unspecified: Secondary | ICD-10-CM | POA: Diagnosis not present

## 2017-10-09 DIAGNOSIS — R509 Fever, unspecified: Secondary | ICD-10-CM | POA: Diagnosis not present

## 2017-10-10 ENCOUNTER — Emergency Department: Payer: 59

## 2017-10-10 ENCOUNTER — Encounter: Payer: Self-pay | Admitting: Emergency Medicine

## 2017-10-10 ENCOUNTER — Telehealth: Payer: Self-pay

## 2017-10-10 ENCOUNTER — Emergency Department
Admission: EM | Admit: 2017-10-10 | Discharge: 2017-10-10 | Disposition: A | Discharge status: Home / Self Care | Payer: 59 | Attending: Emergency Medicine | Admitting: Emergency Medicine

## 2017-10-10 DIAGNOSIS — E876 Hypokalemia: Secondary | ICD-10-CM | POA: Diagnosis not present

## 2017-10-10 DIAGNOSIS — Z7984 Long term (current) use of oral hypoglycemic drugs: Secondary | ICD-10-CM | POA: Diagnosis not present

## 2017-10-10 DIAGNOSIS — Z79899 Other long term (current) drug therapy: Secondary | ICD-10-CM | POA: Diagnosis not present

## 2017-10-10 DIAGNOSIS — E119 Type 2 diabetes mellitus without complications: Secondary | ICD-10-CM | POA: Diagnosis not present

## 2017-10-10 DIAGNOSIS — I10 Essential (primary) hypertension: Secondary | ICD-10-CM | POA: Diagnosis not present

## 2017-10-10 DIAGNOSIS — R0789 Other chest pain: Secondary | ICD-10-CM | POA: Diagnosis not present

## 2017-10-10 DIAGNOSIS — I2510 Atherosclerotic heart disease of native coronary artery without angina pectoris: Secondary | ICD-10-CM | POA: Diagnosis not present

## 2017-10-10 DIAGNOSIS — Z87891 Personal history of nicotine dependence: Secondary | ICD-10-CM | POA: Insufficient documentation

## 2017-10-10 DIAGNOSIS — R079 Chest pain, unspecified: Secondary | ICD-10-CM | POA: Diagnosis not present

## 2017-10-10 LAB — BASIC METABOLIC PANEL
Anion gap: 13 (ref 5–15)
BUN: 14 mg/dL (ref 6–20)
CALCIUM: 10 mg/dL (ref 8.9–10.3)
CO2: 26 mmol/L (ref 22–32)
CREATININE: 0.73 mg/dL (ref 0.61–1.24)
Chloride: 100 mmol/L — ABNORMAL LOW (ref 101–111)
GFR calc non Af Amer: >60 mL/min (ref >60)
Glucose, Bld: 101 mg/dL — ABNORMAL HIGH (ref 65–99)
Potassium: 2.8 mmol/L — ABNORMAL LOW (ref 3.5–5.1)
SODIUM: 139 mmol/L (ref 135–145)

## 2017-10-10 LAB — CBC
HCT: 40.8 % (ref 40.0–52.0)
Hemoglobin: 13.9 g/dL (ref 13.0–18.0)
MCH: 29.3 pg (ref 26.0–34.0)
MCHC: 34 g/dL (ref 32.0–36.0)
MCV: 86.1 fL (ref 80.0–100.0)
PLATELETS: 251 10*3/uL (ref 150–440)
RBC: 4.74 MIL/uL (ref 4.40–5.90)
RDW: 14.4 % (ref 11.5–14.5)
WBC: 14.4 10*3/uL — AB (ref 3.8–10.6)

## 2017-10-10 LAB — HEPATIC FUNCTION PANEL
ALBUMIN: 4.6 g/dL (ref 3.5–5.0)
ALT: 38 U/L (ref 17–63)
AST: 43 U/L — AB (ref 15–41)
Alkaline Phosphatase: 71 U/L (ref 38–126)
BILIRUBIN DIRECT: 0.1 mg/dL (ref 0.1–0.5)
BILIRUBIN TOTAL: 0.8 mg/dL (ref 0.3–1.2)
Indirect Bilirubin: 0.7 mg/dL (ref 0.3–0.9)
Total Protein: 8 g/dL (ref 6.5–8.1)

## 2017-10-10 LAB — TROPONIN I

## 2017-10-10 LAB — LIPASE, BLOOD: LIPASE: 26 U/L (ref 11–51)

## 2017-10-10 NOTE — Discharge Instructions (Signed)
Return to the emergency room for any new or worsening symptoms including abdominal pain, vomiting, fever, chills, diarrhea, chest pain, shortness of breath or if you feel worse in any way.  You  would prefer not to have [place] give you potassium but is certainly the case that you must take at least 40 mEq of potassium a day for the next for 5 days.  We do notice that you have a trace amount of fluid around the lung, this is not something that requires emergent admission to the hospital most likely but it does mean that you need to follow closely with her primary care doctor for it.  If you have any other concerning symptoms or you change your mind about further care from [place] please return.  Follow closely with your primary care doctor.

## 2017-10-10 NOTE — ED Triage Notes (Signed)
Patient presents to ED via POV from home with c/o chest pain x 2 days. Patient was recently at [facility] and was told his potassium was very low. Patient left AMA.

## 2017-10-10 NOTE — Telephone Encounter (Signed)
Pt states he was at [facility] to see his father who is in rehab age 56 and started to smell chemical smell which stick with him. He was having chest pain in middle and went to ER yesterday in [place] his EKG came back normal but blood work like K and Mg came back as 1.6 they wanted to admit patient but he was concerned about bed weather and wanted to come back home. Pt called back to let us know and mentioned that he is suffering thought some emotional depression and started crying. Pt was suggested to go to ER to get evaluated. Dr. Parks Ranger agrees.

## 2017-10-10 NOTE — ED Notes (Signed)
Lab called to do an add-on for the Hepatic function panel and Lipase.

## 2017-10-10 NOTE — ED Notes (Signed)

## 2017-10-10 NOTE — ED Notes (Signed)
Unable to obtain E-Signature, E-Sign pad in the room is not working; hard copy printed and signed by the patient.  

## 2017-10-10 NOTE — ED Provider Notes (Signed)
[facility] Emergency Department Provider Note  ____________________________________________   I have reviewed the triage vital signs and the nursing notes.   HISTORY  Chief Complaint Chest Pain    HPI Dale [name] is a 56 y.o. male presents today complaining of total different complaints.  Patient states he is on a Dilaudid for years, but he went off of it on Monday.  He has had some diarrhea the diarrhea is completely resolved he had a few minutes of chest pain yesterday or the day before which went away with drinking cold water, he was at a outside hospital where he was told he had low potassium and they wanted to keep him from repletion but he left AMA.  He states that he feels "weird" especially because he states that his sister wore a cheap perfume and he smelled it  everywhere for a few hours.  He denies any seizure activity.  He has potassium at home he states he does not want me to replete his potassium he has no ongoing chest pain he just wants to make sure "everything is okay". He states he feels anxious.  He has no SI or HI.  He is sure that that "chest pain" was a "stomach thing" because he got better with water as it always does.  Is not exertional, no radiation, no shortness of breath nonpleuritic no  Leg swelling he states overall his symptoms are getting better.  Past Medical History:  Diagnosis Date  . ADHD (attention deficit hyperactivity disorder)   . Anxiety   . Diabetes mellitus, type II ([place])   . Heart disease     Patient Active Problem List   Diagnosis Date Noted  . Moderate episode of recurrent major depressive disorder ([place]) 07/21/2017  . Therapeutic opioid-induced constipation (OIC) 07/21/2017  . MCI (mild cognitive impairment) with memory loss 08/06/2016  . Peripheral neuropathy 08/06/2016  . Anxiety 07/17/2016  . Insomnia 07/17/2016  . Pain medication agreement 01/30/2016  . Eczema 01/22/2016  . Benign essential tremor  10/03/2015  . Glaucoma 06/27/2015  . B12 deficiency 06/27/2015  . Cataplexy and narcolepsy 06/26/2015  . Billowing mitral valve 06/26/2015  . History of prolonged Q-T interval on ECG 06/26/2015  . Herniation of nucleus pulposus 06/26/2015  . Depression 06/26/2015  . Low testosterone 06/26/2015  . Obesity (BMI 30.0-34.9) 06/26/2015  . CAD (coronary artery disease), native coronary artery 06/26/2015  . 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 Fatty infiltration of liver 10/21/2014  . Psoriasis 07/22/2014  . Acid reflux 07/22/2014  . Type 2 diabetes, controlled, with neuropathy ([place]) 06/22/2014  . HLD (hyperlipidemia) 06/22/2014  . Essential (primary) hypertension 06/22/2014  . Failed back syndrome of lumbar spine 04/29/2014  . Chronic pain associated with significant psychosocial dysfunction 04/29/2014  . History of surgical procedure 04/29/2014  . Polypharmacy 09/09/2013  . Pain medication agreement signed 09/09/2013  . Other long term (current) drug therapy 11/20/1998    Past Surgical History:  Procedure Laterality Date  . SPINE SURGERY     Multiple surgeries, initial 2001, thoracic disc repair and recurrent revisions.  . THROAT SURGERY      Prior to Admission medications   Medication Sig Start Date End Date Taking? Authorizing Provider  ALPRAZolam Duanne Moron) 1 MG tablet Take 1 tablet (1 mg total) by mouth 3 (three) times daily. 07/21/17   Karamalegos, Devonne Doughty, DO  amLODipine (NORVASC) 5 MG tablet Take 1 tablet by mouth daily. 09/27/15 12/17/16  [provider]  atorvastatin (  LIPITOR) 10 MG tablet TAKE 1 TABLET BY MOUTH  DAILY AT 2 PM. 08/08/17   Karamalegos, Devonne Doughty, DO  Blood Glucose Monitoring Suppl (ONETOUCH VERIO FLEX SYSTEM) w/Device KIT  06/26/16   [provider]  buPROPion (WELLBUTRIN XL) 150 MG 24 hr tablet Take 1 tablet (150 mg total) by mouth daily. 01/20/17   Rainey Pines, MD  BUTRANS 10 MCG/HR PTWK patch  08/05/16   [provider]   cholecalciferol (VITAMIN D) 400 units TABS tablet Take 400 Units by mouth.    [provider]  co-enzyme Q-10 30 MG capsule Take 30 mg by mouth 3 (three) times daily.    [provider]  desonide (DESOWEN) 0.05 % cream Apply topically 2 (two) times daily as needed. 01/30/16   Plonk, Gwyndolyn Saxon, MD  desoximetasone (TOPICORT) 0.25 % cream APPLY 1 APPLICATION TOPICALLY 2 (TWO) TIMES DAILY AS NEEDED. 08/20/17   Karamalegos, Devonne Doughty, DO  Flaxseed, Linseed, (FLAXSEED OIL) 1000 MG CAPS Take 1 capsule (1,000 mg total) by mouth daily. 07/30/16   Karamalegos, Devonne Doughty, DO  glucose blood (ONETOUCH VERIO) test strip Check blood sugar up to 3x daily. 07/18/16   Karamalegos, Devonne Doughty, DO  hydrochlorothiazide (HYDRODIURIL) 25 MG tablet Take 1 tablet (25 mg total) by mouth daily. 08/08/17   Karamalegos, Devonne Doughty, DO  latanoprost (XALATAN) 0.005 % ophthalmic solution Place 1 drop into both eyes at bedtime. 09/01/14   [provider]  lisinopril (PRINIVIL,ZESTRIL) 5 MG tablet Take 1 tablet (5 mg total) by mouth daily. 08/08/17   Karamalegos, Devonne Doughty, DO  metFORMIN (GLUCOPHAGE) 1000 MG tablet Take 1 tablet (1,000 mg total) by mouth 2 (two) times daily with a meal. 10/23/16   Karamalegos, Devonne Doughty, DO  Multiple Vitamin (MULTIVITAMIN) tablet Take 1 tablet by mouth daily.    [provider]  Omega-3 Fatty Acids (FISH OIL) 1200 MG CAPS Take 1 capsule (1,200 mg total) by mouth daily. 07/30/16   Karamalegos, Devonne Doughty, DO  PARoxetine (PAXIL) 40 MG tablet Take 1 tablet (40 mg total) by mouth every morning. 01/20/17   Rainey Pines, MD  Phosphatidylserine-DHA-EPA 100-19.5-6.5 MG CAPS Take 1 capsule by mouth daily. 08/06/16   Karamalegos, Devonne Doughty, DO  tamsulosin (FLOMAX) 0.4 MG CAPS capsule Take 1 capsule (0.4 mg total) by mouth daily. 02/19/17   Ardis Hughs, MD    Allergies Benzoin and Morphine  Family History  Problem Relation Age of Onset  . Depression Mother    . Anxiety disorder Mother   . Diabetes Mother   . Heart failure Mother   . Heart attack Father   . Diabetes Sister   . Obesity Sister   . Drug abuse Brother   . Alcohol abuse Brother   . Depression Sister   . Anxiety disorder Sister   . Drug abuse Sister   . Anxiety disorder Sister   . Seizures Sister   . Diabetes Sister   . Depression Sister     Social History Social History   Tobacco Use  . Smoking status: Former Smoker    Types: Cigarettes    Start date: 08/29/1979    Last attempt to quit: 11/04/2010    Years since quitting: 6.9  . Smokeless tobacco: Former Systems developer    Quit date: 11/19/2010  . Tobacco comment: 1 to 1.5 packs per day  Substance Use Topics  . Alcohol use: No    Alcohol/week: 0.0 oz  . Drug use: No    Review of Systems Constitutional:  No fever/chills Eyes: No visual changes. ENT: No sore throat. No stiff neck no neck pain Cardiovascular: Denies chest pain.  Yesterday where he had a few seconds of stomach discomfort that was relieved with cold water. Respiratory: Denies shortness of breath. Gastrointestinal:   no vomiting.  No diarrhea.  No constipation. Genitourinary: Negative for dysuria. Musculoskeletal: Negative lower extremity swelling Skin: Negative for rash. Neurological: Negative for severe headaches, focal weakness or numbness.   ____________________________________________   PHYSICAL EXAM:  VITAL SIGNS: ED Triage Vitals [10/10/17 1444]  Enc Vitals Group     BP 130/74     Pulse Rate 81     Resp 15     Temp 98.6 F (37 C)     Temp Source Oral     SpO2 97 %     Weight 220 lb (99.8 kg)     Height _0  (1.803 m)     Head Circumference      Peak Flow      Pain Score      Pain Loc      Pain Edu?      Excl. in [place]?     Constitutional: Alert and oriented. Well appearing and in no acute distress. Eyes: Conjunctivae are normal Head: Atraumatic HEENT: No congestion/rhinnorhea. Mucous membranes are moist.  Oropharynx  non-erythematous Neck:   Nontender with no meningismus, no masses, no stridor Cardiovascular: Normal rate, regular rhythm. Grossly normal heart sounds.  Good peripheral circulation. Respiratory: Normal respiratory effort.  No retractions. Lungs CTAB. Abdominal: Soft and nontender. No distention. No guarding no rebound Back:  There is no focal tenderness or step off.  there is no midline tenderness there are no lesions noted. there is no CVA tenderness Musculoskeletal: No lower extremity tenderness, no upper extremity tenderness. No joint effusions, no DVT signs strong distal pulses no edema Neurologic:  Normal speech and language. No gross focal neurologic deficits are appreciated.  Skin:  Skin is warm, dry and intact. No rash noted. Psychiatric: Mood and affect are anxious. Speech and behavior are normal.  ____________________________________________   LABS (all labs ordered are listed, but only abnormal results are displayed)  Labs Reviewed  BASIC METABOLIC PANEL - Abnormal; Notable for the following components:      Result Value   Potassium 2.8 (*)    Chloride 100 (*)    Glucose, Bld 101 (*)    All other components within normal limits  CBC - Abnormal; Notable for the following components:   WBC 14.4 (*)    All other components within normal limits  TROPONIN I    Pertinent labs  results that were available during my care of the patient were reviewed by me and considered in my medical decision making (see chart for details). ____________________________________________  EKG  I personally interpreted any EKGs ordered by me or triage Sinus rhythm 82 bpm normal axis no acute ST elevation or ST depression no specific ST changes ____________________________________________  RADIOLOGY  Pertinent labs & imaging results that were available during my care of the patient were reviewed by me and considered in my medical decision making (see chart for details). If possible, patient  and/or family made aware of any abnormal findings.  Dg Chest 2 View  Result Date: 10/10/2017 CLINICAL DATA:  Chest pain x3 days EXAM: CHEST  2 VIEW COMPARISON:  06/01/2015 FINDINGS: Small right pleural effusion. Left lung is essentially clear. No pneumothorax. The heart is normal in size. Mild degenerative changes of the visualized thoracolumbar spine.  IMPRESSION: Small right pleural effusion. Electronically Signed   By: [name] M.D.   On: 10/10/2017 15:13   ____________________________________________    PROCEDURES  Procedure(s) performed: None  Procedures  Critical Care performed: None  ____________________________________________   INITIAL IMPRESSION / ASSESSMENT AND PLAN / ED COURSE  Pertinent labs & imaging results that were available during my care of the patient were reviewed by me and considered in my medical decision making (see chart for details).  Had diarrhea, jitteriness, and feeling generally unwell after going off Dilaudid which she has been on for 7 years.  He is adamant this is not represent withdrawal even though all the symptoms started the day after he went off chronic Dilaudid.  He did state that they had something of a wean.  In any event, he has no symptoms at this time.  Patient does state he was also on a patch that begins with "B" and he took that off as well.  His potassium is borderline low and I have offered him repletion here but he refuses.  Patient states he wants to go home and take his potassium pills there instead.  He does not wish any further care for me at this time.  He is somewhat upset that I suggested that this could be withdrawal although again patient had diarrhea and jitteriness after going off Dilaudid.  In any event, he has excellent outpatient close follow-up with multiple different providers and he refuses further care for me.  I strongly advised him to take the potassium as directed at home he states he will do so.  There is no  evidence of ACS PE or dissection.  Patient had a stomachache accompanied by diarrhea which was relieved by taking a drink of cold water and that is completely resolved and not come back since that time that was over 24 hours ago he has negative troponins here, I do not think serial enzymes are indicated.  Nothing to suggest PE.  In addition, patient has had diarrhea but is no longer having diarrhea I suspect this is why his potassium was somewhat low.  His abdomen is completely benign.  At this time, there does not appear to be clinical evidence to support the diagnosis of pulmonary embolus, dissection, myocarditis, endocarditis, pericarditis, pericardial tamponade, acute coronary syndrome, pneumothorax, pneumonia, or any other acute intrathoracic pathology that will require admission or acute intervention. Nor is there evidence of any significant intra-abdominal pathology causing this discomfort.  Considering the patient's symptoms, medical history, and physical examination today, I have low suspicion for cholecystitis or biliary pathology, pancreatitis, perforation or bowel obstruction, hernia, intra-abdominal abscess, AAA or dissection, volvulus or intussusception, mesenteric ischemia, ischemic gut, pyelonephritis or appendicitis.  In addition, I do notice that the patient has appears to be a tiny pleural effusion of uncertain etiology.  We will advise close outpatient follow-up for this patient made aware of all of her findings here.  Given that he refuses potassium and declines further workup we will discharge him.  Return precautions and follow-up given and understood.    ____________________________________________   FINAL CLINICAL IMPRESSION(S) / ED DIAGNOSES  Final diagnoses:  None      This chart was dictated using voice recognition software.  Despite best efforts to proofread,  errors can occur which can change meaning.      Schuyler Amor, MD 10/10/17 [phone]

## 2017-10-20 ENCOUNTER — Ambulatory Visit: Payer: 59 | Admitting: Family Medicine

## 2017-10-20 ENCOUNTER — Encounter: Payer: Self-pay | Admitting: Family Medicine

## 2017-10-20 ENCOUNTER — Ambulatory Visit
Admission: RE | Admit: 2017-10-20 | Discharge: 2017-10-20 | Disposition: A | Discharge status: Home / Self Care | Payer: 59 | Source: Ambulatory Visit | Attending: Family Medicine | Admitting: Family Medicine

## 2017-10-20 VITALS — BP 146/90 | HR 90 | Temp 98.7°F | Resp 16 | Ht 72.0 in | Wt 219.4 lb

## 2017-10-20 DIAGNOSIS — J90 Pleural effusion, not elsewhere classified: Secondary | ICD-10-CM | POA: Diagnosis not present

## 2017-10-20 DIAGNOSIS — R918 Other nonspecific abnormal finding of lung field: Secondary | ICD-10-CM | POA: Insufficient documentation

## 2017-10-20 DIAGNOSIS — R05 Cough: Secondary | ICD-10-CM

## 2017-10-20 DIAGNOSIS — R058 Other specified cough: Secondary | ICD-10-CM

## 2017-10-20 DIAGNOSIS — E876 Hypokalemia: Secondary | ICD-10-CM | POA: Diagnosis not present

## 2017-10-20 DIAGNOSIS — M961 Postlaminectomy syndrome, not elsewhere classified: Secondary | ICD-10-CM | POA: Diagnosis not present

## 2017-10-20 MED ORDER — AZITHROMYCIN 250 MG PO TABS: ORAL_TABLET | ORAL | 0 refills | Status: DC

## 2017-10-20 NOTE — Progress Notes (Signed)
Subjective:    Patient ID: Dale [name]., male    DOB: February 12, 1961, 56 y.o.   MRN: [id number]  Dale [name]. is a 56 y.o. male presenting on 10/20/2017 for Hospitalization Follow-up (hypokalemia)   HPI   ED FOLLOW-UP VISIT  Hospital/Location: [facility] ED Date of ED Visit: 10/10/17  Reason for Presenting to ED: Atypical Chest Pain, Diarrhea, Withdrawal opiate, Hypokalemia  FOLLOW-UP  - ED provider note and record have been reviewed - Patient presents today about 10 days after recent ED visit. Brief summary of recent course, patient had symptoms of diarrhea, atypical chest pain, some difficulty breathing, hypokalemia and opiate withdrawal after recent STOP of Dilaudid pain pump from [facility] after being on this for many years, this was all initially evaluated at outside facility in [place], do not have records for this but it was reported back on 12/7. At [facility] ED, testing in ED with EKG, labs BMET showed K 2.8, borderline elevated AST 43, negative Troponin, Chest X-ray showed small Right pleural effusion, treated with K dose in ED, and advised close outpatient follow-up. - Today reports overall has done well after discharge from ED. Symptoms of chest discomfort and pains have resolved. He is now more concerned about some difficulty with breathing at times, still has some productive cough thicker sputum by his report, and has occasional chills, with subjective fever, not measured. He has history of pneumonia, wanted to get this checked out, and wants to know more about pleural effusion on X-ray. - Regarding potassium low, he is still taking K supplement 78mEq twice daily, along with OTC Magnesium supplement twice daily, magnesium level was not checked in hospital, he would like repeat blood work today. His diarrhea has resolved, this was a problem initially on opiate withdrawal as the cause for Low K - Additional updates, he had pre-op with [facility] today, and he has upcoming surgery scheduled  at [facility] for spine on 12/21 to remove pain pump (Dilaudid) - he states has had tremors as residual side effect off opiate, thinks related to withdrawal, gradual improvement, he has some improved sensation on outer aspect of lower extremity and foot, seems improved now has more sensation but can fee some pain in this area, seems improved  - New medications on discharge: Potassium and Magnesium - unsure doses both BID - Changes to current meds on discharge: None  No flowsheet data found.  Social History   Tobacco Use  . Smoking status: Former Smoker    Types: Cigarettes    Start date: 08/29/1979    Last attempt to quit: 11/04/2010    Years since quitting: 6.9  . Smokeless tobacco: Former Systems developer    Quit date: 11/19/2010  . Tobacco comment: 1 to 1.5 packs per day  Substance Use Topics  . Alcohol use: No    Alcohol/week: 0.0 oz  . Drug use: No    Review of Systems Per HPI unless specifically indicated above     Objective:    BP (!) 146/90   Pulse 90   Temp 98.7 F (37.1 C) (Oral)   Resp 16   Ht 6' (1.829 m)   Wt 219 lb 6.4 oz (99.5 kg)   SpO2 97%   BMI 29.76 kg/m   Wt Readings from Last 3 Encounters:  10/20/17 219 lb 6.4 oz (99.5 kg)  10/10/17 220 lb (99.8 kg)  07/21/17 234 lb (106.1 kg)    Physical Exam  Constitutional: He is oriented to person, place, and time.  He appears well-developed and well-nourished. No distress.  Well-appearing, comfortable, cooperative, has cane for ambulation  HENT:  Head: Normocephalic and atraumatic.  Mouth/Throat: Oropharynx is clear and moist.  Eyes: Conjunctivae are normal. Right eye exhibits no discharge. Left eye exhibits no discharge.  Neck: Normal range of motion. Neck supple.  Cardiovascular: Normal rate, regular rhythm, normal heart sounds and intact distal pulses.  No murmur heard. Pulmonary/Chest: Effort normal. No respiratory distress. He has no wheezes.  Mild reduced air movement bilateral lung bases R less than L. No focal  crackles. Good air movement. Speaks full sentences. No coughing in exam  Musculoskeletal: He exhibits no edema.  Lymphadenopathy:    He has no cervical adenopathy.  Neurological: He is alert and oriented to person, place, and time.  Skin: Skin is warm and dry. No rash noted. He is not diaphoretic. No erythema.  Psychiatric: He has a normal mood and affect. His behavior is normal.  Well groomed, good eye contact, normal speech and thoughts  Nursing note and vitals reviewed.   I have personally reviewed the radiology report from ED 10/10/17 Chest X-ray.  CLINICAL DATA:  Chest pain x3 days  EXAM: CHEST  2 VIEW  COMPARISON:  06/01/2015  FINDINGS: Small right pleural effusion. Left lung is essentially clear. No pneumothorax.  The heart is normal in size.  Mild degenerative changes of the visualized thoracolumbar spine.  IMPRESSION: Small right pleural effusion.   Electronically Signed   By: [name] M.D.   On: 10/10/2017 15:13  -------------------------------------------------------------  I have personally reviewed the radiology report from CXR TODAY 10/20/17  CLINICAL DATA:  Right pleural effusion.  Productive cough.  EXAM: CHEST  2 VIEW  COMPARISON:  10/10/2017, 05/07/2010  FINDINGS: There is no focal parenchymal opacity. There is chronic blunting of the right costophrenic angle likely reflecting scarring. There is no significant right pleural effusion. There is no left pleural effusion. There is no pneumothorax. The heart and mediastinal contours are unremarkable.  The osseous structures are unremarkable.  IMPRESSION: 1. There is chronic blunting of the right costophrenic angle likely reflecting scarring. There is no significant right pleural effusion. The overall appearance is unchanged compared with prior x-ray dated 05/07/2010.   Electronically Signed   By: Kathreen Devoid   On: 10/20/2017 15:05    Results for orders placed or  performed during the hospital encounter of 02/58/52  Basic metabolic panel  Result Value Ref Range   Sodium 139 135 - 145 mmol/L   Potassium 2.8 (L) 3.5 - 5.1 mmol/L   Chloride 100 (L) 101 - 111 mmol/L   CO2 26 22 - 32 mmol/L   Glucose, Bld 101 (H) 65 - 99 mg/dL   BUN 14 6 - 20 mg/dL   Creatinine, Ser 0.73 0.61 - 1.24 mg/dL   Calcium 10.0 8.9 - 10.3 mg/dL   GFR calc non Af Amer >60 >60 mL/min   GFR calc Af Amer >60 >60 mL/min   Anion gap 13 5 - 15  CBC  Result Value Ref Range   WBC 14.4 (H) 3.8 - 10.6 K/uL   RBC 4.74 4.40 - 5.90 MIL/uL   Hemoglobin 13.9 13.0 - 18.0 g/dL   HCT 40.8 40.0 - 52.0 %   MCV 86.1 80.0 - 100.0 fL   MCH 29.3 26.0 - 34.0 pg   MCHC 34.0 32.0 - 36.0 g/dL   RDW 14.4 11.5 - 14.5 %   Platelets 251 150 - 440 K/uL  Troponin I  Result  Value Ref Range   Troponin I <0.03 <0.03 ng/mL  Hepatic function panel  Result Value Ref Range   Total Protein 8.0 6.5 - 8.1 g/dL   Albumin 4.6 3.5 - 5.0 g/dL   AST 43 (H) 15 - 41 U/L   ALT 38 17 - 63 U/L   Alkaline Phosphatase 71 38 - 126 U/L   Total Bilirubin 0.8 0.3 - 1.2 mg/dL   Bilirubin, Direct 0.1 0.1 - 0.5 mg/dL   Indirect Bilirubin 0.7 0.3 - 0.9 mg/dL  Lipase, blood  Result Value Ref Range   Lipase 26 11 - 51 U/L      Assessment & Plan:   Problem List Items Addressed This Visit    None    Visit Diagnoses    Hypokalemia    -  Primary  Secondary to diarrhea GI losses from opiate withdrawal recently off Dilaudid pump (stopped, not removed yet), improved clinically, last check 12/7 in [facility] ED at 2.8, no mag level - Continue K Dur 90mEq BID and Mag OTC BID for now until result available - Check BMET and Magnesium today, lab draw in office this afternoon - Will notify patient how to adjust doses after result review - if needs lasix will have to continue K supplement while on lasix    Relevant Orders   BASIC METABOLIC PANEL WITH GFR   Magnesium   Pleural effusion, right     Based on radiology report from [facility] today in comparison to prior CXR in ED 12/7 and even years ago 2011 to 2016, consistent with stable R lower lung chronic scarring appearance, not actual pleural effusion., called patient after results to review this, will not pursue any diuretic therapy or other intervention for possible effusion - Based on reading cannot rule out possible infiltrate in this area, give clinical constellation of symptoms concerning for possible CAP, agree to cover with empiric Azithromycin Z-pak especially leading up to potential surgery    Relevant Orders   DG Chest 2 View   Productive cough       Relevant Orders   DG Chest 2 View      No orders of the defined types were placed in this encounter.   Follow up plan: Return in about 3 months (around 01/18/2018), or if symptoms worsen or fail to improve, for already scheduled apt.  [name], [facility] 10/20/2017, 2:40 PM

## 2017-10-20 NOTE — Patient Instructions (Addendum)
Thank you for coming to the clinic today.  1. I am encouraged that your pain and sensation will gradually improve, as we discussed it sounds like your withdrawal is improving.  2. For now continue Potassium twice daily and Magnesium twice daily - STAY TUNED for update from lab result today, will advise further  3. Pleural effusion - small area on Right lower lung, on last CXR 12/7 in hospital - we will double check to see if it is changed or improved  If still fluid - will prescribe Lasix fluid pill daily for about 3 days  Also consider possible pneumonia may add antibiotic  DUE for repeat lab today - Potassium / Magnesium  Please schedule a Follow-up Appointment to: Return in about 3 months (around 01/18/2018), or if symptoms worsen or fail to improve, for already scheduled apt.  If you have any other questions or concerns, please feel free to call the clinic or send a message through [place]. You may also schedule an earlier appointment if necessary.  Additionally, you may be receiving a survey about your experience at our clinic within a few days to 1 week by e-mail or mail. We value your feedback.  [name], DO [place]

## 2017-10-21 LAB — BASIC METABOLIC PANEL WITH GFR
BUN/Creatinine Ratio: 28 (calc) — ABNORMAL HIGH (ref 6–22)
BUN: 19 mg/dL (ref 7–25)
CALCIUM: 10.3 mg/dL (ref 8.6–10.3)
CO2: 30 mmol/L (ref 20–32)
CREATININE: 0.67 mg/dL — AB (ref 0.70–1.33)
Chloride: 101 mmol/L (ref 98–110)
GFR, EST NON AFRICAN AMERICAN: 107 mL/min/{1.73_m2} (ref >=60)
GFR, Est African American: 124 mL/min/{1.73_m2} (ref >=60)
GLUCOSE: 131 mg/dL — AB (ref 65–99)
POTASSIUM: 3.9 mmol/L (ref 3.5–5.3)
Sodium: 141 mmol/L (ref 135–146)

## 2017-10-21 LAB — MAGNESIUM: MAGNESIUM: 2 mg/dL (ref 1.5–2.5)

## 2017-10-22 DIAGNOSIS — G894 Chronic pain syndrome: Secondary | ICD-10-CM | POA: Diagnosis not present

## 2017-10-22 DIAGNOSIS — M961 Postlaminectomy syndrome, not elsewhere classified: Secondary | ICD-10-CM | POA: Diagnosis not present

## 2017-10-22 DIAGNOSIS — Z9889 Other specified postprocedural states: Secondary | ICD-10-CM | POA: Diagnosis not present

## 2017-10-24 DIAGNOSIS — M961 Postlaminectomy syndrome, not elsewhere classified: Secondary | ICD-10-CM | POA: Diagnosis not present

## 2017-10-24 DIAGNOSIS — E119 Type 2 diabetes mellitus without complications: Secondary | ICD-10-CM | POA: Diagnosis not present

## 2017-10-24 DIAGNOSIS — G8929 Other chronic pain: Secondary | ICD-10-CM | POA: Diagnosis not present

## 2017-10-24 DIAGNOSIS — G894 Chronic pain syndrome: Secondary | ICD-10-CM | POA: Diagnosis not present

## 2017-10-29 DIAGNOSIS — R51 Headache: Secondary | ICD-10-CM | POA: Diagnosis not present

## 2017-10-29 DIAGNOSIS — H53149 Visual discomfort, unspecified: Secondary | ICD-10-CM | POA: Diagnosis not present

## 2017-10-29 DIAGNOSIS — G960 Cerebrospinal fluid leak: Secondary | ICD-10-CM | POA: Diagnosis not present

## 2017-10-30 DIAGNOSIS — G960 Cerebrospinal fluid leak: Secondary | ICD-10-CM | POA: Diagnosis not present

## 2017-10-31 DIAGNOSIS — Z9289 Personal history of other medical treatment: Secondary | ICD-10-CM | POA: Diagnosis not present

## 2017-10-31 DIAGNOSIS — G960 Cerebrospinal fluid leak: Secondary | ICD-10-CM | POA: Diagnosis not present

## 2017-10-31 DIAGNOSIS — G9782 Other postprocedural complications and disorders of nervous system: Secondary | ICD-10-CM | POA: Insufficient documentation

## 2017-10-31 DIAGNOSIS — E119 Type 2 diabetes mellitus without complications: Secondary | ICD-10-CM | POA: Diagnosis not present

## 2017-11-07 DIAGNOSIS — Z9289 Personal history of other medical treatment: Secondary | ICD-10-CM | POA: Diagnosis not present

## 2017-11-14 DIAGNOSIS — S99922A Unspecified injury of left foot, initial encounter: Secondary | ICD-10-CM | POA: Diagnosis not present

## 2017-11-14 DIAGNOSIS — M47816 Spondylosis without myelopathy or radiculopathy, lumbar region: Secondary | ICD-10-CM | POA: Diagnosis not present

## 2017-11-14 DIAGNOSIS — G8929 Other chronic pain: Secondary | ICD-10-CM | POA: Diagnosis not present

## 2017-11-14 DIAGNOSIS — R51 Headache: Secondary | ICD-10-CM | POA: Diagnosis not present

## 2017-11-14 DIAGNOSIS — M19072 Primary osteoarthritis, left ankle and foot: Secondary | ICD-10-CM | POA: Diagnosis not present

## 2017-11-14 DIAGNOSIS — S92811A Other fracture of right foot, initial encounter for closed fracture: Secondary | ICD-10-CM | POA: Diagnosis not present

## 2017-12-12 DIAGNOSIS — M5136 Other intervertebral disc degeneration, lumbar region: Secondary | ICD-10-CM | POA: Diagnosis not present

## 2017-12-16 DIAGNOSIS — S92812D Other fracture of left foot, subsequent encounter for fracture with routine healing: Secondary | ICD-10-CM | POA: Diagnosis not present

## 2017-12-16 DIAGNOSIS — S92902D Unspecified fracture of left foot, subsequent encounter for fracture with routine healing: Secondary | ICD-10-CM | POA: Diagnosis not present

## 2017-12-17 DIAGNOSIS — Q667 Congenital pes cavus: Secondary | ICD-10-CM | POA: Diagnosis not present

## 2017-12-17 DIAGNOSIS — M2022 Hallux rigidus, left foot: Secondary | ICD-10-CM | POA: Diagnosis not present

## 2017-12-23 DIAGNOSIS — Z79899 Other long term (current) drug therapy: Secondary | ICD-10-CM | POA: Diagnosis not present

## 2018-01-09 ENCOUNTER — Other Ambulatory Visit: Payer: Self-pay | Admitting: Family Medicine

## 2018-01-09 DIAGNOSIS — M961 Postlaminectomy syndrome, not elsewhere classified: Secondary | ICD-10-CM

## 2018-01-15 ENCOUNTER — Ambulatory Visit (INDEPENDENT_AMBULATORY_CARE_PROVIDER_SITE_OTHER): Payer: 59 | Admitting: Urology

## 2018-01-15 ENCOUNTER — Encounter: Payer: Self-pay | Admitting: Urology

## 2018-01-15 VITALS — BP 138/82 | HR 101 | Resp 16 | Ht 72.0 in | Wt 231.0 lb

## 2018-01-15 DIAGNOSIS — Z125 Encounter for screening for malignant neoplasm of prostate: Secondary | ICD-10-CM | POA: Diagnosis not present

## 2018-01-15 DIAGNOSIS — R7989 Other specified abnormal findings of blood chemistry: Secondary | ICD-10-CM

## 2018-01-15 DIAGNOSIS — G8921 Chronic pain due to trauma: Secondary | ICD-10-CM | POA: Diagnosis not present

## 2018-01-15 DIAGNOSIS — M961 Postlaminectomy syndrome, not elsewhere classified: Secondary | ICD-10-CM | POA: Diagnosis not present

## 2018-01-15 DIAGNOSIS — Z9889 Other specified postprocedural states: Secondary | ICD-10-CM | POA: Diagnosis not present

## 2018-01-15 NOTE — Progress Notes (Signed)
01/15/2018 3:14 PM   Dale [name]. [birth year] [id number]  Referring provider: Olin Hauser, DO [address] [place], [place] [address]  Chief Complaint  Patient presents with  . Hypogonadism    HPI: The patient is a 57 year old gentleman with a past medical history of BPH on Flomax, pelvic floor dysfunction, severe constipation, and chronic back pain on opioids presents today to discuss low testosterone.  1.  BPH/pelvic floor dysfunction Patient has been on Flomax.  He he was recommended to undergo pelvic floor physical therapy at his last appointment in August 2017 but did not undergo this treatment.  Since having his Dilaudid pump removed, he has had resolution of his urinary symptoms.  He no longer feels that he needs Flomax.  2.  Hypogonadism The patient would like to discuss low testosterone levels today.  Patient reportedly has a testosterone of 69 and 70 on two different occasions.  These were apparently done at [place] [facility] however I do not see them in Epic.  Regardless however these labs were not drawn before 10 AM per the patient. The patient has been on testosterone in the past.  He stopped it because he ran out of refills and did not feel like  make a follow-up appointment.  Today he endorses severe fatigue.  He also has erectile dysfunction.  He is urged to main complaints.  He did feel the symptoms are much better when he is on testosterone in the past.  He did use injection therapy  at that time.  He would like to get back on testosterone.  PSA was 0.2 in September 2018.   PMH: Past Medical History:  Diagnosis Date  . ADHD (attention deficit hyperactivity disorder)   . Anxiety   . Diabetes mellitus, type II ([place])   . Heart disease     Surgical History: Past Surgical History:  Procedure Laterality Date  . SPINE SURGERY     Multiple surgeries, initial 2001, thoracic disc repair and recurrent revisions.  . THROAT SURGERY      Home  Medications:  Allergies as of 01/15/2018      Reactions   Benzoin Other (See Comments)   blisters   Morphine Other (See Comments)   Penicillins Other (See Comments)      Medication List        Accurate as of 01/15/18  3:14 PM. Always use your most recent med list.          ALPRAZolam 1 MG tablet Commonly known as:  XANAX Take 1 tablet (1 mg total) by mouth 3 (three) times daily.   amLODipine 5 MG tablet Commonly known as:  NORVASC Take 1 tablet by mouth daily.   amphetamine-dextroamphetamine 10 MG 24 hr capsule Commonly known as:  ADDERALL XR Take 10 mg by mouth daily.   atorvastatin 10 MG tablet Commonly known as:  LIPITOR TAKE 1 TABLET BY MOUTH  DAILY AT 2 PM.   azithromycin 250 MG tablet Commonly known as:  ZITHROMAX Z-PAK Take 2 tabs (545m total) on Day 1. Take 1 tab (2530m daily for next 4 days.   baclofen 10 MG tablet Commonly known as:  LIORESAL TAKE 1 TABLET (10 MG TOTAL) BY MOUTH 4 (FOUR) TIMES DAILY.   buPROPion 150 MG 24 hr tablet Commonly known as:  WELLBUTRIN XL Take 1 tablet (150 mg total) by mouth daily.   BUTRANS 10 MCG/HR Ptwk patch Generic drug:  buprenorphine   cholecalciferol 400 units Tabs tablet Commonly known  as:  VITAMIN D Take 400 Units by mouth.   co-enzyme Q-10 30 MG capsule Take 30 mg by mouth 3 (three) times daily.   desonide 0.05 % cream Commonly known as:  DESOWEN Apply topically 2 (two) times daily as needed.   desoximetasone 0.25 % cream Commonly known as:  TOPICORT APPLY 1 APPLICATION TOPICALLY 2 (TWO) TIMES DAILY AS NEEDED.   Fish Oil 1200 MG Caps Take 1 capsule (1,200 mg total) by mouth daily.   Flaxseed Oil 1000 MG Caps Take 1 capsule (1,000 mg total) by mouth daily.   gabapentin 300 MG capsule Commonly known as:  NEURONTIN Take 300 mg by mouth 3 (three) times daily.   glucose blood test strip Commonly known as:  ONETOUCH VERIO Check blood sugar up to 3x daily.   hydrochlorothiazide 25 MG  tablet Commonly known as:  HYDRODIURIL Take 1 tablet (25 mg total) by mouth daily.   latanoprost 0.005 % ophthalmic solution Commonly known as:  XALATAN Place 1 drop into both eyes at bedtime.   lisinopril 5 MG tablet Commonly known as:  PRINIVIL,ZESTRIL Take 1 tablet (5 mg total) by mouth daily.   metFORMIN 1000 MG tablet Commonly known as:  GLUCOPHAGE Take 1 tablet (1,000 mg total) by mouth 2 (two) times daily with a meal.   multivitamin tablet Take 1 tablet by mouth daily.   [place] w/Device Kit   PARoxetine 40 MG tablet Commonly known as:  PAXIL Take 1 tablet (40 mg total) by mouth every morning.   Phosphatidylserine-DHA-EPA 100-19.5-6.5 MG Caps Take 1 capsule by mouth daily.   tamsulosin 0.4 MG Caps capsule Commonly known as:  FLOMAX Take 1 capsule (0.4 mg total) by mouth daily.       Allergies:  Allergies  Allergen Reactions  . Benzoin Other (See Comments)    blisters  . Morphine Other (See Comments)  . Penicillins Other (See Comments)    Family History: Family History  Problem Relation Age of Onset  . Depression Mother   . Anxiety disorder Mother   . Diabetes Mother   . Heart failure Mother   . Heart attack Father   . Diabetes Sister   . Obesity Sister   . Drug abuse Brother   . Alcohol abuse Brother   . Depression Sister   . Anxiety disorder Sister   . Drug abuse Sister   . Anxiety disorder Sister   . Seizures Sister   . Diabetes Sister   . Depression Sister     Social History:  reports that he quit smoking about 7 years ago. His smoking use included cigarettes. He started smoking about 38 years ago. He quit smokeless tobacco use about 7 years ago. He reports that he does not drink alcohol or use drugs.  ROS: UROLOGY Frequent Urination?: No Hard to postpone urination?: No Burning/pain with urination?: No Get up at night to urinate?: No Leakage of urine?: No Urine stream starts and stops?: No Trouble starting  stream?: No Do you have to strain to urinate?: No Blood in urine?: No Urinary tract infection?: No Sexually transmitted disease?: No Injury to kidneys or bladder?: No Painful intercourse?: No Weak stream?: No Erection problems?: Yes Penile pain?: No  Gastrointestinal Nausea?: No Vomiting?: No Indigestion/heartburn?: No Diarrhea?: No Constipation?: No  Constitutional Fever: No Night sweats?: Yes Weight loss?: No Fatigue?: Yes  Skin Skin rash/lesions?: No Itching?: No  Eyes Blurred vision?: No Double vision?: No  Ears/Nose/Throat Sore throat?: No Sinus problems?: No  Hematologic/Lymphatic Swollen glands?: No Easy bruising?: No  Cardiovascular Leg swelling?: No Chest pain?: No  Respiratory Cough?: No Shortness of breath?: No  Endocrine Excessive thirst?: No  Musculoskeletal Back pain?: Yes Joint pain?: No  Neurological Headaches?: Yes Dizziness?: No  Psychologic Depression?: Yes Anxiety?: Yes  Physical Exam: BP 138/82   Pulse (!) 101   Resp 16   Ht 6' (1.829 m)   Wt 231 lb (104.8 kg)   SpO2 98%   BMI 31.33 kg/m   Constitutional:  Alert and oriented, No acute distress. HEENT: [place] AT, moist mucus membranes.  Trachea midline, no masses. Cardiovascular: No clubbing, cyanosis, or edema. Respiratory: Normal respiratory effort, no increased work of breathing. GI: Abdomen is soft, nontender, nondistended, no abdominal masses GU: No CVA tenderness.  DRE: 30 smooth benign Skin: No rashes, bruises or suspicious lesions. Lymph: No cervical or inguinal adenopathy. Neurologic: Grossly intact, no focal deficits, moving all 4 extremities. Psychiatric: Normal mood and affect.  Laboratory Data: Lab Results  Component Value Date   WBC 14.4 (H) 10/10/2017   HGB 13.9 10/10/2017   HCT 40.8 10/10/2017   MCV 86.1 10/10/2017   PLT 251 10/10/2017    Lab Results  Component Value Date   CREATININE 0.67 (L) 10/20/2017    Lab Results  Component Value  Date   PSA 0.2 07/18/2016    Lab Results  Component Value Date   TESTOSTERONE 69 (L) 07/18/2016    Lab Results  Component Value Date   HGBA1C 6.5 (A) 07/21/2017    Urinalysis    Component Value Date/Time   COLORURINE Yellow 08/20/2013 1920   APPEARANCEUR Clear 07/01/2016 0941   LABSPEC 1.015 08/20/2013 1920   PHURINE 5.0 08/20/2013 1920   GLUCOSEU Negative 07/01/2016 0941   GLUCOSEU Negative 08/20/2013 1920   HGBUR Negative 08/20/2013 1920   BILIRUBINUR Negative 07/01/2016 0941   BILIRUBINUR Negative 08/20/2013 1920   KETONESUR Trace 08/20/2013 1920   PROTEINUR Negative 07/01/2016 0941   PROTEINUR Negative 08/20/2013 1920   NITRITE Negative 07/01/2016 0941   NITRITE Negative 08/20/2013 1920   LEUKOCYTESUR Trace (A) 07/01/2016 0941   LEUKOCYTESUR 1+ 08/20/2013 1920    Assessment & Plan:    1.  Low testosterone I did discuss with the patient that we would need to repeat his labs prior to 10 AM to ensure that insurance will pay for any replacement medication.  I do suspect that his testosterone will remain low as has in the past.  We will also order prolactin, FSH, LH, and hematocrit prior to instituting therapy.  We will call him with results of these labs.  If they remain low as I suspect, we will start him on injection testosterone therapy.  He was warned of the risks of testosterone replacement.  He understands these include but are not limited to the theoretical propagation of prostate cancer, cardiovascular disease including MI and stroke, need for frequent laboratory testing.  Is elected to proceed with replacement therapy if his labs again confirm it was suspected already.  2.  Prostate cancer screening Up-to-date  Return for lab appt x 2.  Nickie Retort, MD  [facility] [address], [place], [place] [address] [phone]

## 2018-01-19 ENCOUNTER — Other Ambulatory Visit: Payer: 59

## 2018-01-19 DIAGNOSIS — R7989 Other specified abnormal findings of blood chemistry: Secondary | ICD-10-CM

## 2018-01-20 ENCOUNTER — Other Ambulatory Visit: Payer: Self-pay

## 2018-01-21 ENCOUNTER — Ambulatory Visit (INDEPENDENT_AMBULATORY_CARE_PROVIDER_SITE_OTHER): Payer: 59 | Admitting: Family Medicine

## 2018-01-21 ENCOUNTER — Encounter: Payer: Self-pay | Admitting: Urology

## 2018-01-21 ENCOUNTER — Encounter: Payer: Self-pay | Admitting: Family Medicine

## 2018-01-21 ENCOUNTER — Other Ambulatory Visit: Payer: 59

## 2018-01-21 VITALS — BP 146/79 | HR 79 | Temp 98.2°F | Resp 16 | Ht 72.0 in | Wt 237.0 lb

## 2018-01-21 DIAGNOSIS — F5101 Primary insomnia: Secondary | ICD-10-CM

## 2018-01-21 DIAGNOSIS — R51 Headache: Secondary | ICD-10-CM

## 2018-01-21 DIAGNOSIS — F419 Anxiety disorder, unspecified: Secondary | ICD-10-CM | POA: Diagnosis not present

## 2018-01-21 DIAGNOSIS — G603 Idiopathic progressive neuropathy: Secondary | ICD-10-CM | POA: Diagnosis not present

## 2018-01-21 DIAGNOSIS — F331 Major depressive disorder, recurrent, moderate: Secondary | ICD-10-CM

## 2018-01-21 DIAGNOSIS — E1140 Type 2 diabetes mellitus with diabetic neuropathy, unspecified: Secondary | ICD-10-CM

## 2018-01-21 DIAGNOSIS — R519 Headache, unspecified: Secondary | ICD-10-CM | POA: Insufficient documentation

## 2018-01-21 DIAGNOSIS — G8929 Other chronic pain: Secondary | ICD-10-CM

## 2018-01-21 LAB — FSH/LH
FSH: 7 m[IU]/mL (ref 1.5–12.4)
LH: 7.3 m[IU]/mL (ref 1.7–8.6)

## 2018-01-21 LAB — POCT GLYCOSYLATED HEMOGLOBIN (HGB A1C): HEMOGLOBIN A1C: 7 — AB (ref ?–5.7)

## 2018-01-21 LAB — PROLACTIN: Prolactin: 10.3 ng/mL (ref 4.0–15.2)

## 2018-01-21 LAB — TESTOSTERONE,FREE AND TOTAL
TESTOSTERONE FREE: 6.5 pg/mL — AB (ref 7.2–24.0)
TESTOSTERONE: 248 ng/dL — AB (ref 264–916)

## 2018-01-21 LAB — HEMOGLOBIN AND HEMATOCRIT, BLOOD
Hematocrit: 40.6 % (ref 37.5–51.0)
Hemoglobin: 13.7 g/dL (ref 13.0–17.7)

## 2018-01-21 MED ORDER — METFORMIN HCL 1000 MG PO TABS: 1000.0000 mg | ORAL_TABLET | Freq: Two times a day (BID) | ORAL | 3 refills | Status: DC

## 2018-01-21 MED ORDER — BUTALBITAL-APAP-CAFFEINE 50-325-40 MG PO TABS: 1.0000 | ORAL_TABLET | Freq: Every day | ORAL | 0 refills | Status: DC | PRN

## 2018-01-21 MED ORDER — DULOXETINE HCL 30 MG PO CPEP: 30.0000 mg | ORAL_CAPSULE | Freq: Every day | ORAL | 2 refills | Status: DC

## 2018-01-21 NOTE — Progress Notes (Signed)
Subjective:    Patient ID: Dale [name]., male    DOB: 05/19/1961, 57 y.o.   MRN: [id number]  Dale [name]. is a 57 y.o. 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 He was scheduled for routine follow-up today, as the time for routine hospital follow-up has lapsed.  Chronic Headaches - Reports prior history of headaches prior to 2001 ultimately this led to his back surgery and then the headaches had resolved, with mostly headache free for 16 years, he was not formally diagnosed with migraine headaches or other factors, when he had mild headache he would lay down and rest in dark room and take tylenol for relief, never on migraine specific medicine. - Now more recently, after spinal pump removal and complication with CSF leak, he has developed more severe chronic daily headaches, improved by Fioricet, was taking 1 daily recently with relief, request rx, he called [facility] today and they advised him to try medicine and call his PCP.  CHRONIC DM, Type 2: Reports he ran out of metformin for 3 days, also limited PO intake recently CBG 95 to 140s range fasting Meds: Metformin 1000mg  BID Reports good compliance. Tolerating well w/o side-effects Currently on ACEi low dose Lifestyle: - Exercise (limited due to pain and neuropathy) - Last DM Eye Exam at [facility] 07/2016, due for repeat Denies hypoglycemia  Chronic Pain / Peripheral Neuropathy - Followed by [facility], seen recently  01/2018. - Recent hospitalization in 11/2017 for removal of intrathecal dilaudid pump, this has been removed and he had complication with CSF leak - Previously has lost sensation on outside of feet - Now after procedure, he has sensation of worsening with "1000s of needles" stabbing inside both feet, improved on Gabapentin now on increased dose 600 / 600 / 900 daily, with improvement, tolerating well, but without any relief on headache - They have considered adding Cymbalta, did not start at last apt  Major Depression / Anxiety / Insomnia: Followed by [facility] (Dr Sherri Rad) - He saw Psych yesterday, no med changes, they discussed new rx nasal spray medicine, but he was concerned with cost and travel back and forth from office, no treatment decided - Had been improved, now recent worsening with multiple life stressors, his father has now just recently gone on hospice care 1 week ago. - Recent worsening mood and poor sleep for past few days - Taking Paxil 40mg  PM, Wellbutrin XL 150mg  daily - Was on Melatonin - Taking Xanax 1mg  TID now, off Temazepam, Ambien, Gabapentin, Seroquel   Depression screen [facility] 2/9 01/21/2018  Decreased Interest 2  Down, Depressed, Hopeless 2  PHQ - 2 Score 4  Altered sleeping 3  Tired, decreased energy 2  Change in appetite 3  Feeling bad or failure about yourself  1  Trouble concentrating 0  Moving slowly or fidgety/restless 0  Suicidal thoughts 1  PHQ-9 Score 14  Difficult doing work/chores Not difficult at all   No flowsheet data found.   Social History  Tobacco Use  . Smoking status: Former Smoker    Types: Cigarettes    Start date: 08/29/1979    Last attempt to quit: 11/04/2010    Years since quitting: 7.2  . Smokeless tobacco: Former Systems developer    Quit date: 11/19/2010  . Tobacco comment: 1 to 1.5 packs per day  Substance Use Topics  . Alcohol use: No    Alcohol/week: 0.0 oz  . Drug use: No    Review of Systems Per HPI unless  specifically indicated above     Objective:    BP (!) 146/79   Pulse 79   Temp 98.2 F (36.8 C) (Oral)   Resp 16   Ht 6' (1.829 m)   Wt 237 lb (107.5 kg)   BMI 32.14 kg/m   Wt Readings from Last 3 Encounters:  01/21/18 237 lb (107.5 kg)  01/15/18 231 lb (104.8 kg)  10/20/17 219 lb 6.4 oz (99.5 kg)    Physical Exam  Constitutional: He is oriented to person, place, and time. He appears well-developed and well-nourished. No distress.  Tired appearing, comfortable, cooperative  HENT:  Head: Normocephalic and atraumatic.  Mouth/Throat: Oropharynx is clear and moist.  Eyes: Conjunctivae and EOM are normal. Pupils are equal, round, and reactive to light. Right eye exhibits no discharge. Left eye exhibits no discharge.  Neck: Normal range of motion. Neck supple.  Cardiovascular: Normal rate, regular rhythm, normal heart sounds and intact distal pulses.  No murmur heard. Pulmonary/Chest: Effort normal and breath sounds normal. No respiratory distress. He has no wheezes. He has no rales.  Musculoskeletal: He exhibits no edema or tenderness.  Upper / Lower Extremities: - Normal muscle tone, strength bilateral upper extremities 5/5, lower extremities 5/5  Lymphadenopathy:    He has no cervical adenopathy.  Neurological: He is alert and oriented to person, place, and time.  Distal sensation intact to light touch ABOVE ankles, only mild intact sensation to touch on feet  Skin: Skin is warm and dry. No rash noted. He is not diaphoretic. No erythema.  Psychiatric: His behavior is normal.  Well groomed, good eye contact, normal speech and thoughts. Mood seems depressed. Not anxious  Nursing note and vitals reviewed.    Recent Labs    07/21/17 1424 01/21/18 1155  HGBA1C 6.5* 7.0*    Results for orders placed or performed in visit on 01/21/18  POCT HgB A1C  Result Value Ref Range   Hemoglobin A1C 7.0 (A) 5.7      Assessment & Plan:   Problem List Items Addressed This Visit     Anxiety   Relevant Medications   DULoxetine (CYMBALTA) 30 MG capsule   Chronic headaches    Uncertain exact etiology, suspect some relation to recent removal intrathecal pump and CSF leak, as this is aligned with onset - However not treated with specific treatment for CSF leak - He has contacted [facility] already, they recommend medicine and f/u here  Plan Rx refill Fioricet for 1 month - precaution given, only temporary rx, should follow-up with [facility] as planned with Dr Manuella Ghazi, and Pain Management, or back with Neurosurgery as this seems like surgical complication      Relevant Medications   naproxen (NAPROSYN) 500 MG tablet   DULoxetine (CYMBALTA) 30 MG capsule   butalbital-acetaminophen-caffeine (FIORICET, ESGIC) 50-325-40 MG tablet   Insomnia    Recent worsening with hospitalization, life stressors, poor mood Taper off Paxil and start Duloxetine 30mg  - see A&P  Relevant Medications   DULoxetine (CYMBALTA) 30 MG capsule   Moderate episode of recurrent major depressive disorder (HCC)    Recent worsening mood Chronic mood disorder with depression Followed by Psychiatry [facility] Dr Geanie Kenning - seen 01/2018 On Paxil 40mg  daily in PM, Wellbutrin XL 150mg  daily, Xanax 1mg  TID  Plan Taper off Paxil half tab for 3-5 days then start new Duloxetine 30mg  daily may need dose inc to 60mg  after 4 weeks Continue Wellbutrin, BDZ per Psych - Goal for SNRI for benefit neuropathy pain and mood/anxiety/insomnia      Relevant Medications   DULoxetine (CYMBALTA) 30 MG capsule   Peripheral neuropathy May benefit from SNRI Cymbalta now     Relevant Medications   QUEtiapine (SEROQUEL) 100 MG tablet   DULoxetine (CYMBALTA) 30 MG capsule   Type 2 diabetes, controlled, with neuropathy (HCC) - Primary    Mild elevated A1c to 7 from 6.5, near goal < 7 Complications - peripheral neuropathy (also secondary to multiple back surgeries, with nerve damage)  Plan:  1. Continue current therapy  - Metformin 1000mg  BID - re ordered 2. Encourage improved lifestyle - low carb, low sugar diet, reduce portion size, continue improving regular exercise 3. Check CBG, bring log to next visit for review 4. Continue ACEi, Statin 5. Advised to schedule DM ophtho exam, send record 6. Follow-up 3 months A1c      Relevant Medications   metFORMIN (GLUCOPHAGE) 1000 MG tablet   Other Relevant Orders   POCT HgB A1C (Completed)      Ultimately reviewed multiple systems with various problems today, and expressed my concerns with patient that due to the complexity of his medical issues from mental health to chronic pain / neurosurgical, he needs to continue follow-up with his specialists to help manage these issues, I have limited options with regards to some of his concerns today, and he needs to continue to seek appropriate management from neurosurgery / neurology, as well as his mood symptoms from Psychiatry. I offered to provide temporary rx for headaches for now, and also switched his SSRI to SNRI for goal of improved neuropathy symptoms, but he should review these with specialist. He understands this plan and my concerns.  Meds ordered this encounter  Medications  . metFORMIN (GLUCOPHAGE) 1000 MG tablet    Sig: Take 1 tablet (1,000 mg total) by mouth 2 (two) times daily with a meal.    Dispense:  180 tablet    Refill:  3  . DULoxetine (CYMBALTA) 30 MG capsule    Sig: Take 1 capsule (30 mg total) by mouth daily. May increase dose to 2 capsules (60mg ) after 4 weeks if not improved    Dispense:  30 capsule    Refill:  2  . butalbital-acetaminophen-caffeine (FIORICET, ESGIC) 50-325-40 MG tablet    Sig: Take 1 tablet by mouth daily as needed for headache.    Dispense:  30 tablet    Refill:  0      Follow up plan: Return in about 3 months (around 04/23/2018) for DM A1c, Headaches, nerve pain, Mood.  [name], [facility] 01/22/2018,  12:17 AM

## 2018-01-21 NOTE — Patient Instructions (Addendum)
Thank you for coming to the office today.  1.  Taper off Paxil 40mg  - cut tab in half for 20mg  - daily for 3 to 5 days, at most a week. Then SWAP to new med Duloxetine (Cymbalta) 30mg  capsule once daily - goal to help mood, anxiety, sleep and nerve pain - may take up to 3-4 weeks for full effect, if still not as effective by 4 weeks, then recommend taking 2 capsules for dose 60mg  - we can send new rx in if you need, or any of your other specialists Psychiatry and Pain can do this as well, as they have recommended this in the past based on your chart.  Rx Fiorciet take one daily as needed for headaches, caution long term use on this is not recommended, it is short term headache relief only. I am concerned that the headaches are truly related to spinal surgery and recent complications, and I would request that your Neurologist or Neurosurgery re-evaluate this and help [place] with your treatment  A1c 7.0, slightly increased but overall still within acceptable range given circumstances  Please have eye doctor send [place] a fax copy of your eye exam from [facility]  In future recommend to see Dr Nehemiah Massed for heart evaluation  If you have any significant chest pain that does not go away within 30 minutes, is accompanied by nausea, sweating, shortness of breath, or made worse by activity, this may be evidence of a heart attack, especially if symptoms worsening instead of improving, please call 911 or go directly to the emergency room immediately for evaluation.   Please schedule a Follow-up Appointment to: Return in about 3 months (around 04/23/2018) for DM A1c, Headaches, nerve pain, Mood.  If you have any other questions or concerns, please feel free to call the office or send a message through [place]. You may also schedule an earlier appointment if necessary.  Additionally, you may be receiving a survey about your experience at our office within a few days to 1 week by e-mail or mail. We value your  feedback.  [name], DO [place]

## 2018-01-22 ENCOUNTER — Telehealth: Payer: Self-pay

## 2018-01-22 ENCOUNTER — Other Ambulatory Visit: Payer: 59

## 2018-01-22 DIAGNOSIS — R7989 Other specified abnormal findings of blood chemistry: Secondary | ICD-10-CM

## 2018-01-22 NOTE — Telephone Encounter (Signed)
Nickie Retort, MD  Toniann Fail C, LPN        Please let patient know that his testosterone was slightly low however not nearly as low as it has been in the past. I would like for him to repeat this level before 10 AM in 2-3 weeks. Thanks    [facility]

## 2018-01-22 NOTE — Assessment & Plan Note (Signed)
Recent worsening with hospitalization, life stressors, poor mood Taper off Paxil and start Duloxetine 30mg  - see A&P

## 2018-01-22 NOTE — Assessment & Plan Note (Signed)
Uncertain exact etiology, suspect some relation to recent removal intrathecal pump and CSF leak, as this is aligned with onset - However not treated with specific treatment for CSF leak - He has contacted [facility] already, they recommend medicine and f/u here  Plan Rx refill Fioricet for 1 month - precaution given, only temporary rx, should follow-up with [facility] Neurology as planned with Dr Manuella Ghazi, and Pain Management, or back with Neurosurgery as this seems like surgical complication

## 2018-01-22 NOTE — Assessment & Plan Note (Signed)
Mild elevated A1c to 7 from 6.5, near goal < 7 Complications - peripheral neuropathy (also secondary to multiple back surgeries, with nerve damage)  Plan:  1. Continue current therapy - Metformin 1000mg  BID - re ordered 2. Encourage improved lifestyle - low carb, low sugar diet, reduce portion size, continue improving regular exercise 3. Check CBG, bring log to next visit for review 4. Continue ACEi, Statin 5. Advised to schedule DM ophtho exam, send record 6. Follow-up 3 months A1c

## 2018-01-22 NOTE — Assessment & Plan Note (Signed)
Recent worsening mood Chronic mood disorder with depression Followed by Psychiatry [facility] Dr Geanie Kenning - seen 01/2018 On Paxil 40mg  daily in PM, Wellbutrin XL 150mg  daily, Xanax 1mg  TID  Plan Taper off Paxil half tab for 3-5 days then start new Duloxetine 30mg  daily may need dose inc to 60mg  after 4 weeks Continue Wellbutrin, BDZ per Psych - Goal for SNRI for benefit neuropathy pain and mood/anxiety/insomnia

## 2018-01-23 ENCOUNTER — Other Ambulatory Visit: Payer: Self-pay

## 2018-01-23 LAB — TESTOSTERONE,FREE AND TOTAL
TESTOSTERONE FREE: 6.2 pg/mL — AB (ref 7.2–24.0)
Testosterone: 248 ng/dL — ABNORMAL LOW (ref 264–916)

## 2018-01-23 NOTE — Telephone Encounter (Signed)
Patient notified of testosterone lab results and scheduled a lab appointment in 3 weeks before 10am.  Patient would like to start testosterone injections.  Please advise dosage and frequency.

## 2018-01-26 NOTE — Telephone Encounter (Signed)
LMOM

## 2018-01-27 ENCOUNTER — Telehealth: Payer: Self-pay | Admitting: Radiology

## 2018-01-27 NOTE — Telephone Encounter (Signed)
Pt would like a return call regarding testosterone results.

## 2018-01-29 NOTE — Telephone Encounter (Signed)
Left pt mess to call/SW 

## 2018-02-03 ENCOUNTER — Other Ambulatory Visit: Payer: Self-pay | Admitting: Family Medicine

## 2018-02-09 ENCOUNTER — Telehealth: Payer: Self-pay | Admitting: Urology

## 2018-02-09 ENCOUNTER — Ambulatory Visit (INDEPENDENT_AMBULATORY_CARE_PROVIDER_SITE_OTHER): Payer: 59 | Admitting: Family Medicine

## 2018-02-09 ENCOUNTER — Other Ambulatory Visit: Payer: Self-pay | Admitting: Family Medicine

## 2018-02-09 ENCOUNTER — Telehealth: Payer: Self-pay | Admitting: Family Medicine

## 2018-02-09 ENCOUNTER — Encounter: Payer: Self-pay | Admitting: Family Medicine

## 2018-02-09 VITALS — BP 148/87 | HR 95 | Temp 98.3°F | Resp 16 | Ht 72.0 in | Wt 238.0 lb

## 2018-02-09 DIAGNOSIS — E1140 Type 2 diabetes mellitus with diabetic neuropathy, unspecified: Secondary | ICD-10-CM

## 2018-02-09 DIAGNOSIS — J209 Acute bronchitis, unspecified: Secondary | ICD-10-CM

## 2018-02-09 MED ORDER — BENZONATATE 100 MG PO CAPS: 100.0000 mg | ORAL_CAPSULE | Freq: Three times a day (TID) | ORAL | 0 refills | Status: DC | PRN

## 2018-02-09 MED ORDER — METFORMIN HCL 1000 MG PO TABS: 1000.0000 mg | ORAL_TABLET | Freq: Two times a day (BID) | ORAL | 0 refills | Status: DC

## 2018-02-09 MED ORDER — "NEEDLE (DISP) 21G X 1-1/2"" MISC": 0 refills | Status: DC

## 2018-02-09 MED ORDER — SYRINGE (DISPOSABLE) 3 ML MISC: 0 refills | Status: DC

## 2018-02-09 MED ORDER — TESTOSTERONE CYPIONATE 200 MG/ML IM SOLN: 200.0000 mg | INTRAMUSCULAR | 0 refills | Status: DC

## 2018-02-09 MED ORDER — AZITHROMYCIN 250 MG PO TABS: ORAL_TABLET | ORAL | 0 refills | Status: DC

## 2018-02-09 MED ORDER — ALBUTEROL SULFATE HFA 108 (90 BASE) MCG/ACT IN AERS: 2.0000 | INHALATION_SPRAY | RESPIRATORY_TRACT | 0 refills | Status: DC | PRN

## 2018-02-09 MED ORDER — "NEEDLE (DISP) 18G X 1"" MISC": 0 refills | Status: DC

## 2018-02-09 NOTE — Progress Notes (Signed)
Subjective:    Patient ID: Dale [name]., male    DOB: 24-Jan-1961, 57 y.o.   MRN: [id number]  Dale Paff. is a 57 y.o. male presenting on 02/09/2018 for Cough (onset 10 days )  Patient presents for a same day appointment.  HPI   ACUTE BRONCHITIS / BRONCHOSPASM Reports symptoms started with sinus pressure congestion head cold worsening over past 7 to 10 days now progressed into lungs lower resp tract, by day 4 seemed to move to chest, thicker productive coughs now with some dyspnea at times. Tried dayquil nyquil OTC meds with partial or limited relief. Not tried Mucinex No prior history of COPD or Asthma. - Denies fevers or chills, headache, sinus pressure or pain, ear pain or pressure, nausea vomiting myalgias, abdominal pain   Depression screen [facility] 2/9 01/21/2018  Decreased Interest 2  Down, Depressed, Hopeless 2  PHQ - 2 Score 4  Altered sleeping 3  Tired, decreased energy 2  Change in appetite 3  Feeling bad or failure about yourself  1  Trouble concentrating 0  Moving slowly or fidgety/restless 0  Suicidal thoughts 1  PHQ-9 Score 14  Difficult doing work/chores Not difficult at all    Social History   Tobacco Use  . Smoking status: Former Smoker    Types: Cigarettes    Start date: 08/29/1979    Last attempt to quit: 11/04/2010    Years since quitting: 7.2  . Smokeless tobacco: Former Systems developer    Quit date: 11/19/2010  . Tobacco comment: 1 to 1.5 packs per day  Substance Use Topics  . Alcohol use: No    Alcohol/week: 0.0 oz  . Drug use: No    Review of Systems Per HPI unless specifically indicated above     Objective:    BP (!) 148/87   Pulse 95   Temp 98.3 F (36.8 C) (Oral)   Resp 16   Ht 6' (1.829 m)   Wt 238 lb (108 kg)   SpO2 95%   BMI 32.28 kg/m   Wt Readings from Last 3 Encounters:  02/09/18 238 lb (108 kg)  01/21/18 237 lb (107.5 kg)  01/15/18 231 lb (104.8 kg)    Physical Exam  Constitutional: He is oriented to person, place,  and time. He appears well-developed and well-nourished. No distress.  Mildly tired and sick appearing, cooperative  HENT:  Head: Normocephalic and atraumatic.  Mouth/Throat: Oropharynx is clear and moist.  Frontal / maxillary sinuses non-tender. Nares patent without purulence or edema. Bilateral TMs clear without erythema, effusion or bulging. Oropharynx clear without erythema, exudates, edema or asymmetry.  Eyes: Conjunctivae are normal. Right eye exhibits no discharge. Left eye exhibits no discharge.  Neck: Normal range of motion. Neck supple. No thyromegaly present.  Cardiovascular: Normal rate, regular rhythm, normal heart sounds and intact distal pulses.  No murmur heard. Pulmonary/Chest: Effort normal. No respiratory distress. He has wheezes. He has no rales.  Diffuse abnormal breath sounds with coarse breathing, some rhonchi and scattered wheezes.  Musculoskeletal: Normal range of motion. He exhibits no edema.  Lymphadenopathy:    He has no cervical adenopathy.  Neurological: He is alert and oriented to person, place, and time.  Skin: Skin is warm and dry. No rash noted. He is not diaphoretic. No erythema.  Psychiatric: He has a normal mood and affect. His behavior is normal.  Well groomed, good eye contact, normal speech and thoughts  Nursing note and vitals reviewed.  Results for orders  placed or performed in visit on 01/22/18  Testosterone,Free and Total  Result Value Ref Range   Testosterone 248 (L) 264 - 916 ng/dL   Testosterone, Free 6.2 (L) 7.2 - 24.0 pg/mL      Assessment & Plan:   Problem List Items Addressed This Visit    Type 2 diabetes, controlled, with neuropathy ([place]) Refill metformin - sent to local pharmacy, problem with mail order [facility], sent rx 3/20 but he never received it    Relevant Medications   metFORMIN (GLUCOPHAGE) 1000 MG tablet    Other Visit Diagnoses    Acute bronchitis with bronchospasm    -  Primary   Relevant Medications   albuterol  (PROVENTIL HFA;VENTOLIN HFA) 108 (90 Base) MCG/ACT inhaler   azithromycin (ZITHROMAX Z-PAK) 250 MG tablet   benzonatate (TESSALON) 100 MG capsule      Consistent with worsening bronchitis in setting of likely viral URI and also likely allergic rhinitis component. Concern with duration >10 days now - Afebrile, lungs with generalized abnormalities without focal problem, and bronchospasm wheezing w/o asthma or COPD  Plan: 1. Start Azithromycin Z-pak dosing 500mg  then 250mg  daily x 4 days  Trial on Albuterol inhaler 2 puffs q 4-6 hour x 3-5 days regularly (discussed proper use, advised to ask pharmacist for demonstration) - Start Tessalon Perls take 1 capsule up to 3 times a day as needed for cough - May try OTC Mucinex (or may try Mucinex-DM for cough) up to 7-10 days then stop - May take OTC allergy meds  Return criteria reviewed, follow-up within 1 week if not improved - consider CXR for prior history of PNA   Meds ordered this encounter  Medications  . metFORMIN (GLUCOPHAGE) 1000 MG tablet    Sig: Take 1 tablet (1,000 mg total) by mouth 2 (two) times daily with a meal.    Dispense:  60 tablet    Refill:  0  . albuterol (PROVENTIL HFA;VENTOLIN HFA) 108 (90 Base) MCG/ACT inhaler    Sig: Inhale 2 puffs into the lungs every 4 (four) hours as needed for wheezing or shortness of breath (cough).    Dispense:  1 Inhaler    Refill:  0  . azithromycin (ZITHROMAX Z-PAK) 250 MG tablet    Sig: Take 2 tabs (500mg  total) on Day 1. Take 1 tab (250mg ) daily for next 4 days.    Dispense:  6 tablet    Refill:  0  . benzonatate (TESSALON) 100 MG capsule    Sig: Take 1 capsule (100 mg total) by mouth 3 (three) times daily as needed for cough.    Dispense:  30 capsule    Refill:  0    Follow up plan: Return in about 1 week (around 02/16/2018), or if symptoms worsen or fail to improve, for bronchitis.  [name], [facility] 02/10/2018, 12:23  AM

## 2018-02-09 NOTE — Telephone Encounter (Signed)
Called and lmom for pt to call back

## 2018-02-09 NOTE — Patient Instructions (Addendum)
Thank you for coming to the office today.  1. It sounds like you had an Upper Respiratory Virus that has settled into a Bronchitis, lower respiratory tract infection. I don't have concerns for pneumonia today, and think that this should gradually improve. Once you are feeling better, the cough may take a few weeks to fully resolve. I do hear wheezing and coarse breath sounds, this may be due to the virus  start Azithromycin Z pak (antibiotic) 2 tabs day 1, then 1 tab x 4 days, complete entire course even if improved  - Use Albuterol inhaler 2 puffs every 4-6 hours around the clock for next 2-3 days, max up to 5 days then use as needed (ask pharmacist to demonstrate proper inhaler use)  Start Tessalon Perls take 1 capsule up to 3 times a day as needed for cough  - Use nasal saline (Simply Saline or Ocean Spray) to flush nasal congestion multiple times a day, may help cough - Drink plenty of fluids to improve congestion  If your symptoms seem to worsen instead of improve over next several days, including significant fever / chills, worsening shortness of breath, worsening wheezing, or nausea / vomiting and can't take medicines - return sooner or go to hospital Emergency Department for more immediate treatment.   We sent rx Metformin to [facility] on 01/21/18 - check with them to see status - I can resend if not received  Please schedule a Follow-up Appointment to: Return in about 1 week (around 02/16/2018), or if symptoms worsen or fail to improve, for bronchitis.  If you have any other questions or concerns, please feel free to call the office or send a message through [place]. You may also schedule an earlier appointment if necessary.  Additionally, you may be receiving a survey about your experience at our office within a few days to 1 week by e-mail or mail. We value your feedback.  [name], DO [place]

## 2018-02-09 NOTE — Telephone Encounter (Signed)
Pt. Called requesting refill on Metformin called into [facility]  In  [place]

## 2018-02-09 NOTE — Telephone Encounter (Signed)
Pt has appointment today will send refill.

## 2018-02-09 NOTE — Telephone Encounter (Signed)
Pt LMOM asking for [facility] or his nurse to call him with his lab results.  Please advise. Thanks.

## 2018-02-10 ENCOUNTER — Encounter: Payer: Self-pay | Admitting: Family Medicine

## 2018-02-13 ENCOUNTER — Other Ambulatory Visit: Payer: Self-pay

## 2018-02-18 ENCOUNTER — Ambulatory Visit (INDEPENDENT_AMBULATORY_CARE_PROVIDER_SITE_OTHER): Payer: 59 | Admitting: Family Medicine

## 2018-02-18 ENCOUNTER — Encounter: Payer: Self-pay | Admitting: Family Medicine

## 2018-02-18 ENCOUNTER — Other Ambulatory Visit: Payer: Self-pay | Admitting: Nurse Practitioner

## 2018-02-18 ENCOUNTER — Other Ambulatory Visit (HOSPITAL_COMMUNITY): Payer: Self-pay | Admitting: Nurse Practitioner

## 2018-02-18 VITALS — BP 137/83 | HR 96 | Temp 98.7°F | Resp 16 | Ht 72.0 in | Wt 238.0 lb

## 2018-02-18 DIAGNOSIS — R413 Other amnesia: Secondary | ICD-10-CM

## 2018-02-18 DIAGNOSIS — G894 Chronic pain syndrome: Secondary | ICD-10-CM

## 2018-02-18 DIAGNOSIS — M961 Postlaminectomy syndrome, not elsewhere classified: Secondary | ICD-10-CM | POA: Diagnosis not present

## 2018-02-18 DIAGNOSIS — G251 Drug-induced tremor: Secondary | ICD-10-CM | POA: Diagnosis not present

## 2018-02-18 DIAGNOSIS — G603 Idiopathic progressive neuropathy: Secondary | ICD-10-CM

## 2018-02-18 MED ORDER — PREDNISONE 20 MG PO TABS: ORAL_TABLET | ORAL | 0 refills | Status: DC

## 2018-02-18 MED ORDER — DULOXETINE HCL 60 MG PO CPEP: 60.0000 mg | ORAL_CAPSULE | Freq: Every day | ORAL | 5 refills | Status: DC

## 2018-02-18 MED ORDER — PREGABALIN 50 MG PO CAPS: 50.0000 mg | ORAL_CAPSULE | Freq: Three times a day (TID) | ORAL | 2 refills | Status: DC

## 2018-02-18 NOTE — Patient Instructions (Addendum)
Thank you for coming to the office today.  Reduce Gabapentin dose by about half for one day, then next day can start Lyrica in its place, 50mg  capsule 3 times daily instead of gabapentin, may take a little time to get to full effect.  Go ahead and increase Cymbalta from 30 to 60mg  - take 2 of the 30s for now, sent new rx already to pharmacy  Also given Prednisone taper - instructions on bottle, 7 day taper gradual reduce dose, for acute worsening nerve pain back and neuropathy  We will contact [facility], to explore other options, before your next follow-up if needed then most likely we can refer you to alternative location and try to re-establish with [facility] instead.  If we get an apt and have a plan in place, I could potentially bridge you with Tramadol to that appointment   Please schedule a Follow-up Appointment to: Return in about 1 month (around 03/18/2018), or if symptoms worsen or fail to improve, for neuropathy / chronic pain.  If you have any other questions or concerns, please feel free to call the office or send a message through [place]. You may also schedule an earlier appointment if necessary.  Additionally, you may be receiving a survey about your experience at our office within a few days to 1 week by e-mail or mail. We value your feedback.  [name], DO [place]

## 2018-02-18 NOTE — Progress Notes (Signed)
Subjective:    Patient ID: Dale [name]., male    DOB: 1961-02-08, 57 y.o.   MRN: [id number]  Naif Alabi. is a 57 y.o. male presenting on 02/18/2018 for Back Pain (pain pump out 12/18 no replacement given for pain meds) and Foot Pain (both side)  Patient presents for a same day appointment.  HPI   FOLLOW-UP Chronic Pain / Peripheral Neuropathy - Followed by [facility], seen recently 01/2018. - Recent hospitalization in 11/2017 for removal of intrathecal dilaudid pump, this has been removed and he had complication with CSF leak, had initial improved sensation in feet then worse after repeat procedure to help fix CSF leak. - He has been improved on gabapentin and other meds before. - Now today has complaints dramatically worsening pain with peripheral neuropathy, had been slightly improved on gabapentin before then increased but difficulty tolerating medicine due to sedation. Last visit [facility], was advised to increase Gabapentin from 300 / 300 / 600 up to 600 / 600 / 900, but he cannot tolerate 600mg  dosing in AM and afternoon, he gets too sedated and groggy. - No longer taking Tizanidine, not effective - He was referred to PT but did not proceed - Taking max dose Tylenol with limited improvement - Previously took Lyrica in past, unsure if it was beneficial, this was long time ago and he had pain pump as well. He is agreeable to re-try this med - He is stating that he is not sure if wants to return to [facility] since they were not able to help his pain most recently, while her previous pain doctor is on maternity leave, they did not refill or offer any medicines, his prior pain doctor is not available until May Does not want to see Dr Dossie Arbour, concern with pain pump then no longer see, concern allergy to pump and repeat operations Regarding his back pain he has no new concerns, it is stable chronic pain for years, but worse concern is podiatry Next  apt with [facility] in June 2019, he has trip needs to drive to [place] May 1 Admits tingling numbness in feet - Denies injury trauma fall, acute worse back pain   Depression screen [facility] 2/9 01/21/2018  Decreased Interest 2  Down, Depressed, Hopeless 2  PHQ - 2 Score 4  Altered sleeping 3  Tired, decreased energy 2  Change in appetite 3  Feeling bad or failure about yourself  1  Trouble concentrating 0  Moving slowly or fidgety/restless 0  Suicidal thoughts 1  PHQ-9 Score 14  Difficult doing work/chores Not difficult at all    Social History   Tobacco Use  . Smoking status: Former Smoker    Types: Cigarettes    Start date: 08/29/1979    Last attempt to quit: 11/04/2010    Years since quitting: 7.2  . Smokeless tobacco: Former Systems developer    Quit date: 11/19/2010  . Tobacco comment: 1 to 1.5 packs per day  Substance Use Topics  . Alcohol use: No    Alcohol/week: 0.0 oz  . Drug use: No    Review of Systems Per HPI unless specifically indicated above     Objective:    BP 137/83   Pulse 96   Temp 98.7 F (37.1 C) (Oral)   Resp 16   Ht 6' (1.829 m)   Wt 238 lb (108 kg)   BMI 32.28 kg/m   Wt Readings from Last 3 Encounters:  02/18/18  238 lb (108 kg)  02/09/18 238 lb (108 kg)  01/21/18 237 lb (107.5 kg)    Physical Exam  Constitutional: He is oriented to person, place, and time. He appears well-developed and well-nourished. No distress.  Chronically ill but uncomfortable, cooperative  HENT:  Head: Normocephalic and atraumatic.  Mouth/Throat: Oropharynx is clear and moist.  Eyes: Conjunctivae are normal. Right eye exhibits no discharge. Left eye exhibits no discharge.  Cardiovascular: Normal rate, regular rhythm, normal heart sounds and intact distal pulses.  No murmur heard. Pulmonary/Chest: Effort normal and breath sounds normal. No respiratory distress. He has no wheezes. He has no rales.  Musculoskeletal: He exhibits no edema or tenderness.  Upper / Lower Extremities: -  Normal muscle tone, strength bilateral upper extremities 5/5, lower extremities 5/5  Neurological: He is alert and oriented to person, place, and time.  Distal sensation intact to light touch ABOVE ankles, only mild intact sensation to touch on feet  Skin: Skin is warm and dry. No rash noted. He is not diaphoretic. No erythema.  Psychiatric: His behavior is normal.  Well groomed, good eye contact, normal speech and thoughts. Mood seems depressed. Not anxious  Nursing note and vitals reviewed.  Results for orders placed or performed in visit on 01/22/18  Testosterone,Free and Total  Result Value Ref Range   Testosterone 248 (L) 264 - 916 ng/dL   Testosterone, Free 6.2 (L) 7.2 - 24.0 pg/mL      Assessment & Plan:   Problem List Items Addressed This Visit    Chronic pain syndrome   Relevant Medications   pregabalin (LYRICA) 50 MG capsule   predniSONE (DELTASONE) 20 MG tablet   Peripheral neuropathy - Primary    Recent worsening. Followed by [facility] and  Limited improvement already on Gabapentin, could not go on higher dose as asked due to sedation, now taking 300/300/60mg  Discussion on pain management and for neuropathy - emphasis that I usually more conservative care, which he has done, and now asking for rx opiates - Taper off Gabapentin as advised - Trial on Lyrica 50mg  TID for now, can titrate up dose to 300 to 450 in 24 hour daily - Rx Prednisone taper over 7 days for acute pain. Review precautions - Agree with prior cymbalta started, at 30mg  daily - now limited benefit, agree to inc dose from 30 to 15m, take x 2 of the 30 and already sent new rx 60mg  - DC muscle relaxants if not effective for other areas - Follow-up if not improved sooner - next step he should discuss neuropathy specifically w/ Neurology including involvement of upper ext. As last time today they only discussed headaches among other issues with cognition.  - Will follow-up with [facility] to  determine their future plans as recently they have not prescribed any opiate medicines. Would next consider alternative pain management such as [facility] or other location, given his complex chronic pain and spine and surgical history, he is not safe/ideal candidate for management with opiates at our office. He did improve with Tramadol before and we may consider a BRIDGE temporary only of this to get to Pain Management if changing in future.      Relevant Medications   hydrOXYzine (VISTARIL) 25 MG capsule   pregabalin (LYRICA) 50 MG capsule   DULoxetine (CYMBALTA) 60 MG capsule   predniSONE (DELTASONE) 20 MG tablet      Meds ordered this encounter  Medications  . pregabalin (LYRICA) 50 MG capsule  Sig: Take 1 capsule (50 mg total) by mouth 3 (three) times daily.    Dispense:  90 capsule    Refill:  2  . DULoxetine (CYMBALTA) 60 MG capsule    Sig: Take 1 capsule (60 mg total) by mouth daily. May increase dose to 2 capsules (60mg ) after 4 weeks if not improved    Dispense:  30 capsule    Refill:  5    Dose increase from 30 to 60mg   . predniSONE (DELTASONE) 20 MG tablet    Sig: Take daily with food. Start with 60mg  (3 pills) x 2 days, then reduce to 40mg  (2 pills) x 2 days, then 20mg  (1 pill) x 3 days    Dispense:  13 tablet    Refill:  0      Follow up plan: Return in about 1 month (around 03/18/2018), or if symptoms worsen or fail to improve, for neuropathy / chronic pain.  [name], [facility] 02/18/2018, 11:17 PM

## 2018-02-18 NOTE — Assessment & Plan Note (Addendum)
Recent worsening. Followed by [facility] and  Limited improvement already on Gabapentin, could not go on higher dose as asked due to sedation, now taking 300/300/60mg  Discussion on pain management and for neuropathy - emphasis that I usually more conservative care, which he has done, and now asking for rx opiates - Taper off Gabapentin as advised - Trial on Lyrica 50mg  TID for now, can titrate up dose to 300 to 450 in 24 hour daily - Rx Prednisone taper over 7 days for acute pain. Review precautions - Agree with prior cymbalta started, at 30mg  daily - now limited benefit, agree to inc dose from 30 to 9m, take x 2 of the 30 and already sent new rx 60mg  - DC muscle relaxants if not effective for other areas - Follow-up if not improved sooner - next step he should discuss neuropathy specifically w/ Neurology including involvement of upper ext. As last time today they only discussed headaches among other issues with cognition.  - Will follow-up with [facility] to determine their future plans as recently they have not prescribed any opiate medicines. Would next consider alternative pain management such as [facility] or other location, given his complex chronic pain and spine and surgical history, he is not safe/ideal candidate for management with opiates at our office. He did improve with Tramadol before and we may consider a BRIDGE temporary only of this to get to Pain Management if changing in future.

## 2018-02-19 ENCOUNTER — Telehealth: Payer: Self-pay | Admitting: Family Medicine

## 2018-02-19 NOTE — Telephone Encounter (Signed)
Attempted to call patient's [facility] providers today, 02/19/18, I last saw patient 4/17 see the note for details.  He has been seen and followed by Dr Hardin Negus and also seen recently by Dr Meade Maw. I have a few questions to review with their clinical staff or the provider to determine what they plan for his future care, as during last visit yesterday 4/17 he has been having worsening pain control, and I would like to review the changes that I made to his med list yesterday and get their input for further assistance.  I gave them clinic and my cell # for call back.  [name], [facility] 02/19/2018, 12:29 PM

## 2018-02-20 ENCOUNTER — Encounter (INDEPENDENT_AMBULATORY_CARE_PROVIDER_SITE_OTHER): Payer: Self-pay

## 2018-02-20 NOTE — Telephone Encounter (Signed)
Received call back today 02/20/18 from [facility], spoke with one of the fellows who has reviewed his chart, and discussed my concerns for him and the new changes that I have made add prednisone for short term relief, change gabapentin to Lyrica, and inc cymbalta from 30 to 60. They were concerned with past history of chronic opiate use and as mentioned they were not planning on immediately resuming opiate therapy at this time. I understood and am not requesting any specific treatment plan, but trying to get an update on current situation and plan for future. They did recommend that he return to see his previous provider Dr Hardin Negus on 03/09/18 to review everything at that time. And they understand he if seeks a 2nd opinion of other pain management.  Attempted to call patient, he did not pick up, left him a voicemail, and will send brief mychart message.  [name], [facility] 02/20/2018, 12:34 PM

## 2018-02-20 NOTE — Telephone Encounter (Signed)
Patient called back, and I spoke to him, relayed message, he will see [facility] on 5/6 discuss and determine next option after that visit  [name], [facility] 02/20/2018, 1:43 PM

## 2018-02-25 ENCOUNTER — Ambulatory Visit (HOSPITAL_COMMUNITY): Payer: 59

## 2018-02-26 DIAGNOSIS — K219 Gastro-esophageal reflux disease without esophagitis: Secondary | ICD-10-CM | POA: Diagnosis not present

## 2018-02-26 DIAGNOSIS — R1031 Right lower quadrant pain: Secondary | ICD-10-CM | POA: Diagnosis not present

## 2018-02-27 ENCOUNTER — Other Ambulatory Visit: Payer: Self-pay | Admitting: Nurse Practitioner

## 2018-02-27 DIAGNOSIS — R1031 Right lower quadrant pain: Secondary | ICD-10-CM

## 2018-02-27 DIAGNOSIS — R630 Anorexia: Secondary | ICD-10-CM

## 2018-02-27 DIAGNOSIS — R1032 Left lower quadrant pain: Principal | ICD-10-CM

## 2018-03-04 ENCOUNTER — Other Ambulatory Visit: Payer: Self-pay | Admitting: Family Medicine

## 2018-03-04 DIAGNOSIS — M961 Postlaminectomy syndrome, not elsewhere classified: Secondary | ICD-10-CM

## 2018-03-08 ENCOUNTER — Other Ambulatory Visit: Payer: Self-pay | Admitting: Family Medicine

## 2018-03-08 DIAGNOSIS — E1140 Type 2 diabetes mellitus with diabetic neuropathy, unspecified: Secondary | ICD-10-CM

## 2018-03-09 ENCOUNTER — Ambulatory Visit
Admission: RE | Admit: 2018-03-09 | Discharge: 2018-03-09 | Disposition: A | Discharge status: Home / Self Care | Payer: 59 | Source: Ambulatory Visit | Attending: Nurse Practitioner | Admitting: Nurse Practitioner

## 2018-03-09 ENCOUNTER — Other Ambulatory Visit
Admission: RE | Admit: 2018-03-09 | Discharge: 2018-03-09 | Disposition: A | Discharge status: Home / Self Care | Payer: 59 | Source: Ambulatory Visit | Attending: Student | Admitting: Student

## 2018-03-09 DIAGNOSIS — M47814 Spondylosis without myelopathy or radiculopathy, thoracic region: Secondary | ICD-10-CM | POA: Insufficient documentation

## 2018-03-09 DIAGNOSIS — R1032 Left lower quadrant pain: Secondary | ICD-10-CM | POA: Insufficient documentation

## 2018-03-09 DIAGNOSIS — M47816 Spondylosis without myelopathy or radiculopathy, lumbar region: Secondary | ICD-10-CM | POA: Diagnosis not present

## 2018-03-09 DIAGNOSIS — I2510 Atherosclerotic heart disease of native coronary artery without angina pectoris: Secondary | ICD-10-CM | POA: Insufficient documentation

## 2018-03-09 DIAGNOSIS — Z01812 Encounter for preprocedural laboratory examination: Secondary | ICD-10-CM | POA: Insufficient documentation

## 2018-03-09 DIAGNOSIS — G8921 Chronic pain due to trauma: Secondary | ICD-10-CM | POA: Diagnosis not present

## 2018-03-09 DIAGNOSIS — I700 Atherosclerosis of aorta: Secondary | ICD-10-CM | POA: Insufficient documentation

## 2018-03-09 DIAGNOSIS — R1031 Right lower quadrant pain: Secondary | ICD-10-CM | POA: Diagnosis not present

## 2018-03-09 DIAGNOSIS — G6289 Other specified polyneuropathies: Secondary | ICD-10-CM | POA: Diagnosis not present

## 2018-03-09 DIAGNOSIS — N281 Cyst of kidney, acquired: Secondary | ICD-10-CM | POA: Insufficient documentation

## 2018-03-09 DIAGNOSIS — K760 Fatty (change of) liver, not elsewhere classified: Secondary | ICD-10-CM | POA: Insufficient documentation

## 2018-03-09 DIAGNOSIS — M961 Postlaminectomy syndrome, not elsewhere classified: Secondary | ICD-10-CM | POA: Diagnosis not present

## 2018-03-09 DIAGNOSIS — R630 Anorexia: Secondary | ICD-10-CM

## 2018-03-09 DIAGNOSIS — K829 Disease of gallbladder, unspecified: Secondary | ICD-10-CM | POA: Diagnosis not present

## 2018-03-09 HISTORY — DX: Essential (primary) hypertension: I10

## 2018-03-09 LAB — CREATININE, SERUM
CREATININE: 0.64 mg/dL (ref 0.61–1.24)
GFR calc Af Amer: >60 mL/min (ref >60)

## 2018-03-09 MED ORDER — IOPAMIDOL (ISOVUE-370) INJECTION 76%
100.0000 mL | Freq: Once | INTRAVENOUS | Status: AC | PRN
  Administered 2018-03-09: 100 mL via INTRAVENOUS

## 2018-03-18 DIAGNOSIS — J0100 Acute maxillary sinusitis, unspecified: Secondary | ICD-10-CM | POA: Diagnosis not present

## 2018-03-18 DIAGNOSIS — R0602 Shortness of breath: Secondary | ICD-10-CM | POA: Diagnosis not present

## 2018-03-18 DIAGNOSIS — J209 Acute bronchitis, unspecified: Secondary | ICD-10-CM | POA: Diagnosis not present

## 2018-04-03 DIAGNOSIS — Z01812 Encounter for preprocedural laboratory examination: Secondary | ICD-10-CM | POA: Diagnosis not present

## 2018-04-03 DIAGNOSIS — R935 Abnormal findings on diagnostic imaging of other abdominal regions, including retroperitoneum: Secondary | ICD-10-CM | POA: Diagnosis not present

## 2018-04-07 DIAGNOSIS — M961 Postlaminectomy syndrome, not elsewhere classified: Secondary | ICD-10-CM | POA: Diagnosis not present

## 2018-04-07 DIAGNOSIS — G8921 Chronic pain due to trauma: Secondary | ICD-10-CM | POA: Diagnosis not present

## 2018-04-07 DIAGNOSIS — G6289 Other specified polyneuropathies: Secondary | ICD-10-CM | POA: Diagnosis not present

## 2018-04-21 DIAGNOSIS — R413 Other amnesia: Secondary | ICD-10-CM | POA: Diagnosis not present

## 2018-04-21 DIAGNOSIS — G251 Drug-induced tremor: Secondary | ICD-10-CM | POA: Diagnosis not present

## 2018-04-21 DIAGNOSIS — M961 Postlaminectomy syndrome, not elsewhere classified: Secondary | ICD-10-CM | POA: Diagnosis not present

## 2018-04-30 ENCOUNTER — Encounter: Payer: Self-pay | Admitting: Family Medicine

## 2018-04-30 ENCOUNTER — Other Ambulatory Visit: Payer: Self-pay | Admitting: Family Medicine

## 2018-04-30 ENCOUNTER — Ambulatory Visit (INDEPENDENT_AMBULATORY_CARE_PROVIDER_SITE_OTHER): Payer: 59 | Admitting: Family Medicine

## 2018-04-30 VITALS — BP 122/83 | HR 93 | Temp 98.6°F | Resp 16 | Ht 72.0 in | Wt 229.0 lb

## 2018-04-30 DIAGNOSIS — Q667 Congenital pes cavus, unspecified foot: Secondary | ICD-10-CM | POA: Insufficient documentation

## 2018-04-30 DIAGNOSIS — E785 Hyperlipidemia, unspecified: Secondary | ICD-10-CM

## 2018-04-30 DIAGNOSIS — E669 Obesity, unspecified: Secondary | ICD-10-CM

## 2018-04-30 DIAGNOSIS — E1140 Type 2 diabetes mellitus with diabetic neuropathy, unspecified: Secondary | ICD-10-CM

## 2018-04-30 DIAGNOSIS — J0101 Acute recurrent maxillary sinusitis: Secondary | ICD-10-CM | POA: Diagnosis not present

## 2018-04-30 DIAGNOSIS — F331 Major depressive disorder, recurrent, moderate: Secondary | ICD-10-CM

## 2018-04-30 DIAGNOSIS — I10 Essential (primary) hypertension: Secondary | ICD-10-CM

## 2018-04-30 DIAGNOSIS — G603 Idiopathic progressive neuropathy: Secondary | ICD-10-CM | POA: Diagnosis not present

## 2018-04-30 DIAGNOSIS — Z125 Encounter for screening for malignant neoplasm of prostate: Secondary | ICD-10-CM

## 2018-04-30 DIAGNOSIS — E538 Deficiency of other specified B group vitamins: Secondary | ICD-10-CM

## 2018-04-30 DIAGNOSIS — Z0000 Encounter for general adult medical examination without abnormal findings: Secondary | ICD-10-CM

## 2018-04-30 DIAGNOSIS — M961 Postlaminectomy syndrome, not elsewhere classified: Secondary | ICD-10-CM

## 2018-04-30 DIAGNOSIS — E1169 Type 2 diabetes mellitus with other specified complication: Secondary | ICD-10-CM

## 2018-04-30 DIAGNOSIS — E66811 Obesity, class 1: Secondary | ICD-10-CM

## 2018-04-30 DIAGNOSIS — I2510 Atherosclerotic heart disease of native coronary artery without angina pectoris: Secondary | ICD-10-CM

## 2018-04-30 LAB — POCT GLYCOSYLATED HEMOGLOBIN (HGB A1C): Hemoglobin A1C: 7.7 % — AB (ref 4.0–5.6)

## 2018-04-30 MED ORDER — LEVOFLOXACIN 500 MG PO TABS: 500.0000 mg | ORAL_TABLET | Freq: Every day | ORAL | 0 refills | Status: DC

## 2018-04-30 NOTE — Progress Notes (Signed)
Subjective:    Patient ID: Dale [name]., male    DOB: 03/19/1961, 57 y.o.   MRN: [id number]  Dale [name]. is a 57 y.o. male presenting on 04/30/2018 for Diabetes (obtw has cough onset month and feet swelling onset week left is worst than Right )   HPI   CHRONIC DM, Type 2: A1c elevated today to 7.7, from 7.0, he is concerned, attributes to steroid medicine. He has tried to improve diet. He has received Prednisone dosepak taper from me back in 02/2018 due to acute on chronic back pain, also recently within few weeks received Dexamethasone injection 10mg  from urgent care for cough bronchitis. CBG none available today, has been elevated Meds:Metformin 1000mg  BID Reports good compliance. Tolerating well w/o side-effects Currently on ACEilow dose Lifestyle: - Diet: reduced portions and less sweets - Exercise (limited due to pain and neuropathy) - Overdue for DM Eye Exam Chronic numbness tingling feet, with history of other neuropathy, followed by [facility] Neurology Denies hypoglycemia  Sinusitis, recurrent History of acute bronchitis seen by me in 02/2018, given prednisone and azithromycin z-pak among other therapy, then he improved but may not have fully resolved, developed another URI illness, describes stress with ill family member and travel back and forth to [place], father in hospice, he developed sinus pain and pressure and cough 4-6 weeks ago. - Recently seen at Urgent Care given 4 injections - found record of it, Toradol, Ceftriaxone, Dexamethasone, Promethazine, and given oral antibiotic Doxycycline, he had a bad reaction to this and had nausea and headache, stopped after 4 pills - Today still sinus pain and pressure and cough, thicker yellow sputum by report - Denies fevers chills, dyspnea  Additional complaint - Left Foot Deformity / Swelling / Pes Cavus - has chronic history of abnormal foot arches, and chronic neuropathy, he has not been seen by Podiatry by he  was advised to get custom orthotics and foot support due to arches and chronic pain / neuropathy. Now he has some increased swelling of Left Forefoot Denies erythema, swelling of leg, injury or trauma  Depression screen [facility] 2/9 01/21/2018  Decreased Interest 2  Down, Depressed, Hopeless 2  PHQ - 2 Score 4  Altered sleeping 3  Tired, decreased energy 2  Change in appetite 3  Feeling bad or failure about yourself  1  Trouble concentrating 0  Moving slowly or fidgety/restless 0  Suicidal thoughts 1  PHQ-9 Score 14  Difficult doing work/chores Not difficult at all    Social History   Tobacco Use  . Smoking status: Former Smoker    Types: Cigarettes    Start date: 08/29/1979    Last attempt to quit: 11/04/2010    Years since quitting: 7.4  . Smokeless tobacco: Former Systems developer    Quit date: 11/19/2010  . Tobacco comment: 1 to 1.5 packs per day  Substance Use Topics  . Alcohol use: No    Alcohol/week: 0.0 oz  . Drug use: No    Review of Systems Per HPI unless specifically indicated above     Objective:    BP 122/83   Pulse 93   Temp 98.6 F (37 C) (Oral)   Resp 16   Ht 6' (1.829 m)   Wt 229 lb (103.9 kg)   BMI 31.06 kg/m   Wt Readings from Last 3 Encounters:  04/30/18 229 lb (103.9 kg)  02/18/18 238 lb (108 kg)  02/09/18 238 lb (108 kg)    Physical Exam  Constitutional: He is oriented to person, place, and time. He appears well-developed and well-nourished. No distress.  Well-appearing, comfortable, cooperative  HENT:  Head: Normocephalic and atraumatic.  Mouth/Throat: Oropharynx is clear and moist.  Mild maxillary sinuses tender R>L. Nares with some deeper turbinate edema without purulence. R TM with mild clear to cloudy effusion, otherwise L TM is normal. No erythema or bulging. Oropharynx clear without erythema, exudates, edema or asymmetry.  Eyes: Conjunctivae are normal. Right eye exhibits no discharge. Left eye exhibits no discharge.  Neck: Neck supple.    Cardiovascular: Normal rate, regular rhythm, normal heart sounds and intact distal pulses.  No murmur heard. Pulmonary/Chest: Effort normal. No respiratory distress. He has no wheezes. He has no rales.  Seems preserved air movement without focal abnormality. No focal crackles, wheezing. Some rhonchi that clears with cough vs transmitted upper airway sounds.  Musculoskeletal: He exhibits no edema (lower extremity other than foot).  Left Foot Pes cavus higher arch, with deformity of forefoot has some non pitting edema and callus formation, see foot exam.  Lymphadenopathy:    He has no cervical adenopathy.  Neurological: He is alert and oriented to person, place, and time.  Skin: Skin is warm and dry. No rash noted. He is not diaphoretic. No erythema.  Psychiatric: His behavior is normal.  Well groomed, good eye contact, normal speech and thoughts. Mood is at baseline, slightly depressed. Not anxious.  Nursing note and vitals reviewed.   Diabetic Foot Exam - Simple   Simple Foot Form Diabetic Foot exam was performed with the following findings:  Yes 04/30/2018 11:55 AM  Visual Inspection See comments:  Yes Sensation Testing See comments:  Yes Pulse Check Posterior Tibialis and Dorsalis pulse intact bilaterally:  Yes Comments Chronic severe reduced sensation to monofilament testing bilaterally. He has pes cavus bilateral L>R with high arch and abnormal deformity of forefoot with callus formation and some non pitting appearance of fullness vs edema of forefoot.     Results for orders placed or performed in visit on 04/30/18  POCT HgB A1C  Result Value Ref Range   Hemoglobin A1C 7.7 (A) 4.0 - 5.6 %   HbA1c POC (<> result, manual entry)  4.0 - 5.6 %   HbA1c, POC (prediabetic range)  5.7 - 6.4 %   HbA1c, POC (controlled diabetic range)  0.0 - 7.0 %      Assessment & Plan:   Problem List Items Addressed This Visit    Peripheral neuropathy    Followed by [facility] Neurology Followed by [facility]  Pain Management - on Butrans Patch recently increased OFF Lyrica Continues on Gabapentin 900 TID, Cymbalta 60 Also on Tamezepam, and off seroquel for insomnia  Given complication with pes cavus and foot deformity and callus formation, DM and neuropathy of other etiology - will refer to [facility] for [facility] evaluation and order - he would benefit from Diabetic Shoes or Custom Shoes for neuropathy      Relevant Orders   Ambulatory referral to Podiatry   Pes cavus   Relevant Orders   Ambulatory referral to Podiatry   Type 2 diabetes, controlled, with neuropathy ([place]) - Primary    Again elevated A1c with worsening DM control 7.7 from prior 7.0 WIth hyperglycemia now. Concern due to recent steroids prednisone / dexamethasone for pain/cough also family stressors Complications - peripheral neuropathy (also secondary to multiple back surgeries, with nerve damage), Hyperlipidemia, GERD  Plan:  1. Continue current therapy - Metformin 1000mg  BID -  Remain off steroids for now if can avoid 2. Encourage improved lifestyle - low carb, low sugar diet, reduce portion size, continue improving regular exercise 3. Check CBG, bring log to next visit for review 4. Continue ACEi, Statin 5. Advised to schedule DM ophtho exam, send record 6. Follow-up 3 months Annual Physical - labs A1c - advised if still increasing A1c >8 we would consider GLP1 or other medication      Relevant Orders   POCT HgB A1C (Completed)   Ambulatory referral to Podiatry    Other Visit Diagnoses    Acute recurrent maxillary sinusitis     Persistent URI sinusitis symptoms, now 2 months after initial treatment for lower respiratory bronchitis, has had upper sinus symptoms and residual cough, now with 2nd sickening, was treated by Urgent Care with Ceftriaxone, uncertain if improved, he stopped doxycycline early due to side effect - Afebrile, lungs non focal  Will start trial on Levaquin for both lower respiratory  and sinusitis coverage Offered CXR, deferred for now Start nasal steroid Flonase 2 sprays in each nostril daily for 4-6 weeks, may repeat course seasonally or as needed May try OTC Mucinex (or may try Mucinex-DM for cough) up to 7-10 days then stop    Relevant Medications   benzonatate (TESSALON) 100 MG capsule   fluticasone (FLONASE) 50 MCG/ACT nasal spray   levofloxacin (LEVAQUIN) 500 MG tablet      Meds ordered this encounter  Medications  . levofloxacin (LEVAQUIN) 500 MG tablet    Sig: Take 1 tablet (500 mg total) by mouth daily. For 7 days    Dispense:  7 tablet    Refill:  0   Orders Placed This Encounter  Procedures  . Ambulatory referral to Podiatry    Referral Priority:   Routine    Referral Type:   Consultation    Referral Reason:   Specialty Services Required    Requested Specialty:   Podiatry    Number of Visits Requested:   1  . POCT HgB A1C    Follow up plan: Return in about 3 months (around 07/31/2018) for Annual Physical.  Future labs ordered for 07/08/18  [name], [facility] 04/30/2018, 6:32 PM

## 2018-04-30 NOTE — Assessment & Plan Note (Signed)
Again elevated A1c with worsening DM control 7.7 from prior 7.0 WIth hyperglycemia now. Concern due to recent steroids prednisone / dexamethasone for pain/cough also family stressors Complications - peripheral neuropathy (also secondary to multiple back surgeries, with nerve damage), Hyperlipidemia, GERD  Plan:  1. Continue current therapy - Metformin 1000mg  BID - Remain off steroids for now if can avoid 2. Encourage improved lifestyle - low carb, low sugar diet, reduce portion size, continue improving regular exercise 3. Check CBG, bring log to next visit for review 4. Continue ACEi, Statin 5. Advised to schedule DM ophtho exam, send record 6. Follow-up 3 months Annual Physical - labs A1c - advised if still increasing A1c >8 we would consider GLP1 or other medication

## 2018-04-30 NOTE — Patient Instructions (Addendum)
Thank you for coming to the office today.  A1c 7.7 - increased, as discussed, likely from Prednisone and Dexamethasone steroid injection, also general stress can increase this  Recommend to keep improving diet. Stay active  No med change today, we may reconsider in future if not improving  For persistent sinus symptoms and lower productive cough - we will cover now with levaquin antibiotic  Start nasal steroid Flonase 2 sprays in each nostril daily for 4-6 weeks, may repeat course seasonally or as needed  May try OTC Mucinex (or may try Mucinex-DM for cough) up to 7-10 days then stop  Stay tuned for apt - need custom orthotic / shoes from [facility] Address: [address] [place], [place] [address] Hours: Open 8AM-5PM Phone: [phone]  DUE for [place] (no food or drink after midnight before the lab appointment, only water or coffee without cream/sugar on the morning of)  SCHEDULE "Lab Only" visit in the morning at the clinic for lab draw in 3 MONTHS   - Make sure Lab Only appointment is at about 1 week before your next appointment, so that results will be available  For Lab Results, once available within 2-3 days of blood draw, you can can log in to MyChart online to view your results and a brief explanation. Also, we can discuss results at next follow-up visit.   Please schedule a Follow-up Appointment to: Return in about 3 months (around 07/31/2018) for Annual Physical.  If you have any other questions or concerns, please feel free to call the office or send a message through [place]. You may also schedule an earlier appointment if necessary.  Additionally, you may be receiving a survey about your experience at our office within a few days to 1 week by e-mail or mail. We value your feedback.  [name], DO [place]

## 2018-04-30 NOTE — Assessment & Plan Note (Signed)
Followed by [facility] Neurology Followed by [facility] Pain Management - on Butrans Patch recently increased OFF Lyrica Continues on Gabapentin 900 TID, Cymbalta 60 Also on Tamezepam, and off seroquel for insomnia  Given complication with pes cavus and foot deformity and callus formation, DM and neuropathy of other etiology - will refer to [place] for [facility] evaluation and order - he would benefit from Diabetic Shoes or Custom Shoes for neuropathy

## 2018-05-05 DIAGNOSIS — M5136 Other intervertebral disc degeneration, lumbar region: Secondary | ICD-10-CM | POA: Diagnosis not present

## 2018-05-06 DIAGNOSIS — R413 Other amnesia: Secondary | ICD-10-CM | POA: Diagnosis not present

## 2018-06-04 ENCOUNTER — Ambulatory Visit: Payer: 59 | Admitting: Podiatry

## 2018-06-04 ENCOUNTER — Encounter: Payer: Self-pay | Admitting: Podiatry

## 2018-06-04 DIAGNOSIS — E1142 Type 2 diabetes mellitus with diabetic polyneuropathy: Secondary | ICD-10-CM

## 2018-06-04 DIAGNOSIS — M216X9 Other acquired deformities of unspecified foot: Secondary | ICD-10-CM

## 2018-06-04 NOTE — Progress Notes (Signed)
This patient presents to the office for an evaluation and treatment of his diabetic feet.  He says he has diabetic neuropathy and is treated with gabapentin.  Patient states that 3-4 months ago he ended up with pain and swelling and a significant enlargement of the big toe joint, left foot. He was told to have his left big toe evaluated.  He says it was  painful walking and wearing his shoes.  He presents the office today stating that it has improved and he is not experiencing pain and discomfort. He also has callus that has formed under the big toe joint of the left foot.  He says he applies Neosporin to the site in an effort to limit the amount of callus.  He also says he was told he had high arches by the doctors at the [facility] clinic and he was told to obtain insoles for his shoes.He presents the office today for an evaluation of his diabetic feet as well as requesting insoles for his feet.  General Appearance  Alert, conversant and in no acute stress.  Vascular  Dorsalis pedis and posterior tibial  pulses are palpable  bilaterally.  Capillary return is within normal limits  bilaterally. Temperature is within normal limits  bilaterally.  Neurologic  Senn-Weinstein monofilament wire test absent  bilaterally. Muscle power within normal limits bilaterally.  Nails Normal nails noted with no evidence of bacterial or fungal infection.  Orthopedic  No limitations of motion of motion feet .  No crepitus or effusions noted.  Limited ROM 1st MPJ  Right foot.  Cavus foot  B/l  Skin  normotropic skin with no porokeratosis noted bilaterally.  No signs of infections or ulcers noted.    Diabetic neuropathy.  Cavus foot  B/L  IE.  Diabetic foot exam reveals absent LOPS. Due to his cavus foot he was dispensed power step insoles.  Examination of his big toe joint, left foot reveals no pathology.   Since there were no clinical findings today, x-rays were not taken.  Examination of his feet reveal normal findings with  the exception of the  LOPS.  RTC prn   Gardiner Barefoot DPM

## 2018-06-22 DIAGNOSIS — R413 Other amnesia: Secondary | ICD-10-CM | POA: Diagnosis not present

## 2018-06-22 DIAGNOSIS — G251 Drug-induced tremor: Secondary | ICD-10-CM | POA: Diagnosis not present

## 2018-06-25 ENCOUNTER — Other Ambulatory Visit: Payer: Self-pay | Admitting: Family Medicine

## 2018-06-25 DIAGNOSIS — E1140 Type 2 diabetes mellitus with diabetic neuropathy, unspecified: Secondary | ICD-10-CM

## 2018-06-25 DIAGNOSIS — G603 Idiopathic progressive neuropathy: Secondary | ICD-10-CM

## 2018-06-25 MED ORDER — GABAPENTIN 300 MG PO CAPS: 300.0000 mg | ORAL_CAPSULE | Freq: Three times a day (TID) | ORAL | 3 refills | Status: DC

## 2018-06-25 NOTE — Telephone Encounter (Signed)
Pt needs a refill on gabapentin sent to [facility] in [place].  He is leaving to go out of town in about 2 hours [phone]

## 2018-06-26 DIAGNOSIS — Z23 Encounter for immunization: Secondary | ICD-10-CM | POA: Diagnosis not present

## 2018-07-08 ENCOUNTER — Other Ambulatory Visit: Payer: 59

## 2018-07-08 DIAGNOSIS — Z0000 Encounter for general adult medical examination without abnormal findings: Secondary | ICD-10-CM

## 2018-07-08 DIAGNOSIS — I2510 Atherosclerotic heart disease of native coronary artery without angina pectoris: Secondary | ICD-10-CM | POA: Diagnosis not present

## 2018-07-08 DIAGNOSIS — F331 Major depressive disorder, recurrent, moderate: Secondary | ICD-10-CM

## 2018-07-08 DIAGNOSIS — E1140 Type 2 diabetes mellitus with diabetic neuropathy, unspecified: Secondary | ICD-10-CM | POA: Diagnosis not present

## 2018-07-08 DIAGNOSIS — E1169 Type 2 diabetes mellitus with other specified complication: Secondary | ICD-10-CM

## 2018-07-08 DIAGNOSIS — Z125 Encounter for screening for malignant neoplasm of prostate: Secondary | ICD-10-CM

## 2018-07-08 DIAGNOSIS — G603 Idiopathic progressive neuropathy: Secondary | ICD-10-CM

## 2018-07-08 DIAGNOSIS — I10 Essential (primary) hypertension: Secondary | ICD-10-CM

## 2018-07-08 DIAGNOSIS — E785 Hyperlipidemia, unspecified: Secondary | ICD-10-CM

## 2018-07-09 LAB — COMPLETE METABOLIC PANEL WITH GFR
AG Ratio: 1.7 (calc) (ref 1.0–2.5)
ALBUMIN MSPROF: 4.6 g/dL (ref 3.6–5.1)
ALKALINE PHOSPHATASE (APISO): 86 U/L (ref 40–115)
ALT: 20 U/L (ref 9–46)
AST: 21 U/L (ref 10–35)
BUN: 25 mg/dL (ref 7–25)
CO2: 29 mmol/L (ref 20–32)
Calcium: 10.1 mg/dL (ref 8.6–10.3)
Chloride: 99 mmol/L (ref 98–110)
Creat: 0.8 mg/dL (ref 0.70–1.33)
GFR, Est African American: 115 mL/min/{1.73_m2} (ref >=60)
GFR, Est Non African American: 99 mL/min/{1.73_m2} (ref >=60)
GLOBULIN: 2.7 g/dL (ref 1.9–3.7)
Glucose, Bld: 137 mg/dL — ABNORMAL HIGH (ref 65–99)
Potassium: 4.1 mmol/L (ref 3.5–5.3)
Sodium: 138 mmol/L (ref 135–146)
Total Bilirubin: 0.4 mg/dL (ref 0.2–1.2)
Total Protein: 7.3 g/dL (ref 6.1–8.1)

## 2018-07-09 LAB — CBC WITH DIFFERENTIAL/PLATELET
BASOS PCT: 0.3 %
Basophils Absolute: 27 cells/uL (ref 0–200)
EOS PCT: 2.1 %
Eosinophils Absolute: 187 cells/uL (ref 15–500)
HCT: 41.2 % (ref 38.5–50.0)
Hemoglobin: 13.4 g/dL (ref 13.2–17.1)
Lymphs Abs: 1842 cells/uL (ref 850–3900)
MCH: 25.8 pg — ABNORMAL LOW (ref 27.0–33.0)
MCHC: 32.5 g/dL (ref 32.0–36.0)
MCV: 79.4 fL — ABNORMAL LOW (ref 80.0–100.0)
MONOS PCT: 8.8 %
MPV: 11.6 fL (ref 7.5–12.5)
NEUTROS PCT: 68.1 %
Neutro Abs: 6061 cells/uL (ref 1500–7800)
PLATELETS: 266 10*3/uL (ref 140–400)
RBC: 5.19 10*6/uL (ref 4.20–5.80)
RDW: 14.9 % (ref 11.0–15.0)
TOTAL LYMPHOCYTE: 20.7 %
WBC: 8.9 10*3/uL (ref 3.8–10.8)
WBCMIX: 783 {cells}/uL (ref 200–950)

## 2018-07-09 LAB — LIPID PANEL
CHOL/HDL RATIO: 3.1 (calc) (ref <5.0)
CHOLESTEROL: 119 mg/dL (ref <200)
HDL: 39 mg/dL — AB (ref >40)
LDL Cholesterol (Calc): 57 mg/dL (calc)
Non-HDL Cholesterol (Calc): 80 mg/dL (calc) (ref <130)
Triglycerides: 142 mg/dL (ref <150)

## 2018-07-09 LAB — HEMOGLOBIN A1C
Hgb A1c MFr Bld: 6.9 % of total Hgb — ABNORMAL HIGH (ref <5.7)
MEAN PLASMA GLUCOSE: 151 (calc)
eAG (mmol/L): 8.4 (calc)

## 2018-07-09 LAB — PSA, TOTAL WITH REFLEX TO PSA, FREE: PSA, Total: 0.5 ng/mL (ref <=4.0)

## 2018-07-14 ENCOUNTER — Ambulatory Visit (INDEPENDENT_AMBULATORY_CARE_PROVIDER_SITE_OTHER): Payer: 59 | Admitting: Family Medicine

## 2018-07-14 ENCOUNTER — Encounter: Payer: Self-pay | Admitting: Family Medicine

## 2018-07-14 VITALS — BP 133/79 | HR 98 | Temp 98.4°F | Resp 16 | Ht 72.0 in | Wt 230.0 lb

## 2018-07-14 DIAGNOSIS — G8929 Other chronic pain: Secondary | ICD-10-CM

## 2018-07-14 DIAGNOSIS — G603 Idiopathic progressive neuropathy: Secondary | ICD-10-CM | POA: Diagnosis not present

## 2018-07-14 DIAGNOSIS — E1140 Type 2 diabetes mellitus with diabetic neuropathy, unspecified: Secondary | ICD-10-CM

## 2018-07-14 DIAGNOSIS — G894 Chronic pain syndrome: Secondary | ICD-10-CM

## 2018-07-14 DIAGNOSIS — E669 Obesity, unspecified: Secondary | ICD-10-CM

## 2018-07-14 DIAGNOSIS — Z0000 Encounter for general adult medical examination without abnormal findings: Secondary | ICD-10-CM

## 2018-07-14 DIAGNOSIS — E785 Hyperlipidemia, unspecified: Secondary | ICD-10-CM

## 2018-07-14 DIAGNOSIS — R109 Unspecified abdominal pain: Secondary | ICD-10-CM

## 2018-07-14 DIAGNOSIS — E1169 Type 2 diabetes mellitus with other specified complication: Secondary | ICD-10-CM

## 2018-07-14 MED ORDER — LISINOPRIL 5 MG PO TABS: 5.0000 mg | ORAL_TABLET | Freq: Every day | ORAL | 3 refills | Status: DC

## 2018-07-14 MED ORDER — DULOXETINE HCL 60 MG PO CPEP: 60.0000 mg | ORAL_CAPSULE | Freq: Every day | ORAL | 3 refills | Status: DC

## 2018-07-14 MED ORDER — METFORMIN HCL 1000 MG PO TABS: 1000.0000 mg | ORAL_TABLET | Freq: Two times a day (BID) | ORAL | 3 refills | Status: DC

## 2018-07-14 MED ORDER — ATORVASTATIN CALCIUM 10 MG PO TABS: ORAL_TABLET | ORAL | 3 refills | Status: DC

## 2018-07-14 NOTE — Patient Instructions (Addendum)
Thank you for coming to the office today.  May continue Dicyclomine 20mg  up to 4 times daily - try taking before meals to see if it helps prevent symptoms.  If not improving can contact GI for 2nd opinion  Recommend annual diabetic eye exam - also concern glaucoma as you mentioned, have the [facility] provider send [place] a copy of your report - and we can update the Diabetic Eye Exam.  Refilled 4 meds - Atorvastatin, Duloxetine, Metformin and Lisinopril - sent to [facility]  Please schedule a Follow-up Appointment to: Return in about 6 months (around 01/12/2019) for DM A1c, specialist f/u (pain, GI, Neuro).  If you have any other questions or concerns, please feel free to call the office or send a message through [place]. You may also schedule an earlier appointment if necessary.  Additionally, you may be receiving a survey about your experience at our office within a few days to 1 week by e-mail or mail. We value your feedback.  Dale Putnam, DO [place]

## 2018-07-14 NOTE — Progress Notes (Signed)
Subjective:    Patient ID: Dale [name]., male    DOB: [birth year], 57 y.o.   MRN: [id number]  Dale Chisolm. is a 57 y.o. male presenting on 07/14/2018 for Annual Exam   HPI   Here for Annual Physical and Lab Review.  CHRONIC DM, Type 2 / Peripheral Neuropathy Improved A1c on lab to 6.9 CBG improved, no log today Meds:Metformin 1071m BID Reports good compliance. Tolerating well w/o side-effects Currently on ACEilow dose Lifestyle: - Diet: reduced portions improved - Exercise (limited due to pain and neuropathy) - Overdue for DM Eye Exam - he will schedule. Chronic numbness tingling feet, with history of other neuropathy, [facility] limited benefit from Podiatry as well at previous visits. Podiatry gave him shoe insoles and neurology continued current meds with gabapentin [phone], Cymbalta 685m and offered Scrambler therapy / TENS unit for feet he is not interested at this time. Ultimately determined to be related to complication from prior back surgery Denies hypoglycemia  HYPERLIPIDEMIA: - Reports no concerns. Last lipid panel improved control 07/2018 - Currently taking Atorvastatin 1053mtolerating well without side effects or myalgias  Abdominal Pain, Chronic, post prandial Followed by [facility], prior work-up in 02/2018. He complains of same problem with GI pain bilateral aspect flank/abdomen, usually lasting 1-2 hours after eating. - Some loose stools or diarrhea - Denies any indigestion, nausea vomiting  Additional update - Per Psychiatry, attempted to add Seroquel but he could not tolerate whole or half tab due to grogginess  Health Maintenance:  PSA 0.5, negative screening.  Due for Flu Shot, [facility] - he felt tired and run down for 1-2 weeks.   Depression screen PHQ 2/9 01/21/2018  Decreased Interest 2  Down, Depressed, Hopeless 2  PHQ - 2 Score 4  Altered sleeping 3  Tired, decreased energy 2  Change in  appetite 3  Feeling bad or failure about yourself  1  Trouble concentrating 0  Moving slowly or fidgety/restless 0  Suicidal thoughts 1  PHQ-9 Score 14  Difficult doing work/chores Not difficult at all   No flowsheet data found.  Past Medical History:  Diagnosis Date  . ADHD (attention deficit hyperactivity disorder)   . Anxiety   . Heart disease   . Hypertension    Past Surgical History:  Procedure Laterality Date  . SPINE SURGERY     Multiple surgeries, initial 2001, thoracic disc repair and recurrent revisions.  . THROAT SURGERY     Social History   Socioeconomic History  . Marital status: Married    Spouse name: Not on file  . Number of children: Not on file  . Years of education: 1 yr of college  . Highest education level: Not on file  Occupational History  . Occupation: Disabled  Social Needs  . Financial resource strain: Not on file  . Food insecurity:    Worry: Not on file    Inability: Not on file  . Transportation needs:    Medical: Not on file    Non-medical: Not on file  Tobacco Use  . Smoking status: Former Smoker    Types: Cigarettes    Start date: 08/29/1979    Last attempt to quit: 11/04/2010    Years since quitting: 7.6  . Smokeless tobacco: Former UseSystems developer Quit date: 11/19/2010  . Tobacco comment: 1 to 1.5 packs per day  Substance and Sexual Activity  . Alcohol use: No  Alcohol/week: 0.0 standard drinks  . Drug use: No  . Sexual activity: Not Currently  Lifestyle  . Physical activity:    Days per week: Not on file    Minutes per session: Not on file  . Stress: Not on file  Relationships  . Social connections:    Talks on phone: Not on file    Gets together: Not on file    Attends religious service: Not on file    Active member of club or organization: Not on file    Attends meetings of clubs or organizations: Not on file    Relationship status: Not on file  . Intimate partner violence:    Fear of current or ex partner: Not on file      Emotionally abused: Not on file    Physically abused: Not on file    Forced sexual activity: Not on file  Other Topics Concern  . Not on file  Social History Narrative  . Not on file   Family History  Problem Relation Age of Onset  . Depression Mother   . Anxiety disorder Mother   . Diabetes Mother   . Heart failure Mother   . Heart attack Father   . Diabetes Sister   . Obesity Sister   . Drug abuse Brother   . Alcohol abuse Brother   . Depression Sister   . Anxiety disorder Sister   . Drug abuse Sister   . Anxiety disorder Sister   . Seizures Sister   . Diabetes Sister   . Depression Sister    Current Outpatient Medications on File Prior to Visit  Medication Sig  . acetaminophen (TYLENOL) 500 MG tablet Take by mouth.  Marland Kitchen albuterol (PROVENTIL HFA;VENTOLIN HFA) 108 (90 Base) MCG/ACT inhaler Inhale 2 puffs into the lungs every 4 (four) hours as needed for wheezing or shortness of breath (cough).  . ALPRAZolam (XANAX) 1 MG tablet Take 1 mg by mouth 3 (three) times daily.  Marland Kitchen amphetamine-dextroamphetamine (ADDERALL XR) 10 MG 24 hr capsule Take 10 mg by mouth daily.  Marland Kitchen b complex vitamins capsule Take by mouth.  . Blood Glucose Monitoring Suppl ([place]) w/Device KIT   . buPROPion (WELLBUTRIN XL) 150 MG 24 hr tablet Take 1 tablet (150 mg total) by mouth daily.  . butalbital-acetaminophen-caffeine (FIORICET, ESGIC) 50-325-40 MG tablet Take 1 tablet by mouth daily as needed for headache.  . cholecalciferol (VITAMIN D) 400 units TABS tablet Take 400 Units by mouth.  . co-enzyme Q-10 30 MG capsule Take 30 mg by mouth 3 (three) times daily.  Marland Kitchen desonide (DESOWEN) 0.05 % cream Apply topically 2 (two) times daily as needed.  . desoximetasone (TOPICORT) 0.25 % cream APPLY 1 APPLICATION TOPICALLY 2 (TWO) TIMES DAILY AS NEEDED.  Marland Kitchen diclofenac sodium (VOLTAREN) 1 % GEL Apply topically.  . dicyclomine (BENTYL) 20 MG tablet TAKE 1 TABLET (20 MG TOTAL) BY MOUTH EVERY 6 (SIX)  HOURS  . Flaxseed, Linseed, (FLAXSEED OIL) 1000 MG CAPS Take 1 capsule (1,000 mg total) by mouth daily.  . fluticasone (FLONASE) 50 MCG/ACT nasal spray Place 2 sprays into both nostrils daily.  Marland Kitchen gabapentin (NEURONTIN) 300 MG capsule Take 1 capsule (300 mg total) by mouth 3 (three) times daily.  Marland Kitchen glucose blood (ONETOUCH VERIO) test strip Check blood sugar up to 3x daily.  . hydrochlorothiazide (HYDRODIURIL) 25 MG tablet TAKE 1 TABLET BY MOUTH EVERY DAY  . hydrOXYzine (VISTARIL) 25 MG capsule 25 mg.  . latanoprost (XALATAN) 0.005 %  ophthalmic solution Place 1 drop into both eyes at bedtime.  . Multiple Vitamin (MULTIVITAMIN) tablet Take 1 tablet by mouth daily.  Marland Kitchen NEEDLE, DISP, 18 G (B-D DISP NEEDLE TW 18GX1") 18G X 1" MISC For use to draw up medication into the syringe  . NEEDLE, DISP, 21 G (BD SAFETYGLIDE SHIELDED NEEDLE) 21G X 1-1/2" MISC For use of administering medication in the IM location  . Omega-3 Fatty Acids (FISH OIL) 1200 MG CAPS Take 1 capsule (1,200 mg total) by mouth daily.  . pantoprazole (PROTONIX) 40 MG tablet Take 40 mg by mouth 2 (two) times daily.  . Phosphatidylserine-DHA-EPA 100-19.5-6.5 MG CAPS Take 1 capsule by mouth daily.  . Syringe, Disposable, 3 ML MISC Use syringes as directed by physician  . tamsulosin (FLOMAX) 0.4 MG CAPS capsule Take 1 capsule (0.4 mg total) by mouth daily.  Marland Kitchen testosterone cypionate (DEPOTESTOSTERONE CYPIONATE) 200 MG/ML injection Inject 1 mL (200 mg total) into the muscle every 14 (fourteen) days.  Marland Kitchen zinc gluconate 50 MG tablet Take by mouth.   No current facility-administered medications on file prior to visit.     Review of Systems  Constitutional: Negative for activity change, appetite change, chills, diaphoresis, fatigue and fever.  HENT: Negative for congestion and hearing loss.   Eyes: Negative for visual disturbance.  Respiratory: Negative for apnea, cough, choking, chest tightness, shortness of breath and wheezing.     Cardiovascular: Negative for chest pain, palpitations and leg swelling.  Gastrointestinal: Negative for abdominal pain, anal bleeding, blood in stool, constipation, diarrhea, nausea and vomiting.  Endocrine: Negative for cold intolerance.  Genitourinary: Negative for dysuria, frequency and hematuria.  Musculoskeletal: Positive for arthralgias and back pain. Negative for neck pain.  Skin: Negative for rash.  Allergic/Immunologic: Negative for environmental allergies.  Neurological: Positive for numbness. Negative for dizziness, weakness, light-headedness and headaches.  Hematological: Negative for adenopathy.  Psychiatric/Behavioral: Negative for behavioral problems, dysphoric mood and sleep disturbance. The patient is not nervous/anxious.    Per HPI unless specifically indicated above      Objective:    BP 133/79   Pulse 98   Temp 98.4 F (36.9 C) (Oral)   Resp 16   Ht 6' (1.829 m)   Wt 230 lb (104.3 kg)   BMI 31.19 kg/m   Wt Readings from Last 3 Encounters:  07/14/18 230 lb (104.3 kg)  04/30/18 229 lb (103.9 kg)  02/18/18 238 lb (108 kg)    Physical Exam  Constitutional: He is oriented to person, place, and time. He appears well-developed and well-nourished. No distress.  Well-appearing, comfortable, cooperative  HENT:  Head: Normocephalic and atraumatic.  Mouth/Throat: Oropharynx is clear and moist.  Eyes: Pupils are equal, round, and reactive to light. Conjunctivae and EOM are normal. Right eye exhibits no discharge. Left eye exhibits no discharge.  Neck: Normal range of motion. Neck supple. No thyromegaly present.  Cardiovascular: Normal rate, regular rhythm, normal heart sounds and intact distal pulses.  No murmur heard. Pulmonary/Chest: Effort normal and breath sounds normal. No respiratory distress. He has no wheezes. He has no rales.  Abdominal: Soft. Bowel sounds are normal. He exhibits no distension and no mass. There is no tenderness.  Musculoskeletal: Normal  range of motion. He exhibits no edema or tenderness.  Upper / Lower Extremities: - Normal muscle tone, strength bilateral upper extremities 5/5, lower extremities 5/5  Lymphadenopathy:    He has no cervical adenopathy.  Neurological: He is alert and oriented to person, place, and time.  Distal  sensation intact to light touch all extremities  Skin: Skin is warm and dry. No rash noted. He is not diaphoretic. No erythema.  Psychiatric: He has a normal mood and affect. His behavior is normal.  Well groomed, good eye contact, normal speech and thoughts  Nursing note and vitals reviewed.  Results for orders placed or performed in visit on 07/08/18  Hemoglobin A1c  Result Value Ref Range   Hgb A1c MFr Bld 6.9 (H) <5.7 % of total Hgb   Mean Plasma Glucose 151 (calc)   eAG (mmol/L) 8.4 (calc)  CBC with Differential/Platelet  Result Value Ref Range   WBC 8.9 3.8 - 10.8 Thousand/uL   RBC 5.19 4.20 - 5.80 Million/uL   Hemoglobin 13.4 13.2 - 17.1 g/dL   HCT 41.2 38.5 - 50.0 %   MCV 79.4 (L) 80.0 - 100.0 fL   MCH 25.8 (L) 27.0 - 33.0 pg   MCHC 32.5 32.0 - 36.0 g/dL   RDW 14.9 11.0 - 15.0 %   Platelets 266 140 - 400 Thousand/uL   MPV 11.6 7.5 - 12.5 fL   Neutro Abs 6,061 1,500 - 7,800 cells/uL   Lymphs Abs 1,842 850 - 3,900 cells/uL   WBC mixed population 783 200 - 950 cells/uL   Eosinophils Absolute 187 15 - 500 cells/uL   Basophils Absolute 27 0 - 200 cells/uL   Neutrophils Relative % 68.1 %   Total Lymphocyte 20.7 %   Monocytes Relative 8.8 %   Eosinophils Relative 2.1 %   Basophils Relative 0.3 %  COMPLETE METABOLIC PANEL WITH GFR  Result Value Ref Range   Glucose, Bld 137 (H) 65 - 99 mg/dL   BUN 25 7 - 25 mg/dL   Creat 0.80 0.70 - 1.33 mg/dL   GFR, Est Non African American 99 > OR = 60 mL/min/1.44m   GFR, Est African American 115 > OR = 60 mL/min/1.770m  BUN/Creatinine Ratio NOT APPLICABLE 6 - 22 (calc)   Sodium 138 135 - 146 mmol/L   Potassium 4.1 3.5 - 5.3 mmol/L    Chloride 99 98 - 110 mmol/L   CO2 29 20 - 32 mmol/L   Calcium 10.1 8.6 - 10.3 mg/dL   Total Protein 7.3 6.1 - 8.1 g/dL   Albumin 4.6 3.6 - 5.1 g/dL   Globulin 2.7 1.9 - 3.7 g/dL (calc)   AG Ratio 1.7 1.0 - 2.5 (calc)   Total Bilirubin 0.4 0.2 - 1.2 mg/dL   Alkaline phosphatase (APISO) 86 40 - 115 U/L   AST 21 10 - 35 U/L   ALT 20 9 - 46 U/L  Lipid panel  Result Value Ref Range   Cholesterol 119 <200 mg/dL   HDL 39 (L) >40 mg/dL   Triglycerides 142 <150 mg/dL   LDL Cholesterol (Calc) 57 mg/dL (calc)   Total CHOL/HDL Ratio 3.1 <5.0 (calc)   Non-HDL Cholesterol (Calc) 80 <130 mg/dL (calc)  PSA, Total with Reflex to PSA, Free  Result Value Ref Range   PSA, Total 0.5 < OR = 4.0 ng/mL      Assessment & Plan:   Problem List Items Addressed This Visit    Chronic pain syndrome F/u with [facility]    Hyperlipidemia associated with type 2 diabetes mellitus (HCC)    Improved control Last lipid panel 07/2018 Calculated ASCVD 10 yr risk score elevated  Plan: Continue current meds - Atorvastatin 1056mor now - may adjust dose Encourage improved lifestyle - low carb/cholesterol, reduce portion size, continue  improving regular exercise      Relevant Medications   metFORMIN (GLUCOPHAGE) 1000 MG tablet   lisinopril (PRINIVIL,ZESTRIL) 5 MG tablet   atorvastatin (LIPITOR) 10 MG tablet   Obesity (BMI 30.0-34.9)    BMI >31, some improved wt, with fluctuation. Overall improved diet/lifestyle Encourage continue improved diet and exercise as tolerated      Peripheral neuropathy    Followed by [facility] / [facility]iatry / [facility] - on Butrans Patch OFF Lyrica Continues on Gabapentin [phone] (recently updated), Cymbalta 60 Also on Tamezepam, and off seroquel for insomnia  Limited options at this point as discussed, requesting that he continue f/u with his specialists Neurology/Podiatry for further intervention. Unlikely to benefit from Cymbalta 90 but could consider if needs to.  Limited gabapentin titration now, prior failure Lyrica.      Relevant Medications   DULoxetine (CYMBALTA) 60 MG capsule   Type 2 diabetes, controlled, with neuropathy (HCC)    Improved DM A1c 6.9, from 7.7. Now off steroids/prednisone Reduced hyperglycemia Complications - peripheral neuropathy (also secondary to multiple back surgeries, with nerve damage), Hyperlipidemia, GERD  Plan:  1. Continue current therapy - Metformin 1094m BID 2. Encourage improved lifestyle - low carb, low sugar diet, reduce portion size, continue improving regular exercise 3. Check CBG, bring log to next visit for review 4. Continue ACEi, Statin 5. Advised to schedule DM ophtho exam, send record 6. Follow-up 6 months A1c - future consider adjust meds may add GLP1 if indicated      Relevant Medications   metFORMIN (GLUCOPHAGE) 1000 MG tablet   lisinopril (PRINIVIL,ZESTRIL) 5 MG tablet   atorvastatin (LIPITOR) 10 MG tablet    Other Visit Diagnoses    Annual physical exam    -  Primary  Updated Health Maintenance information Reviewed recent lab results with patient Encouraged improvement to lifestyle with diet and exercise - Goal of weight loss    Chronic abdominal pain       Seems to be chronic, bilateral postprandial. [facility] Advised him to take Dicyclomine prior to meals, f/u w/ GI if not improve.   Relevant Medications   DULoxetine (CYMBALTA) 60 MG capsule      Meds ordered this encounter  Medications  . metFORMIN (GLUCOPHAGE) 1000 MG tablet    Sig: Take 1 tablet (1,000 mg total) by mouth 2 (two) times daily with a meal.    Dispense:  180 tablet    Refill:  3  . lisinopril (PRINIVIL,ZESTRIL) 5 MG tablet    Sig: Take 1 tablet (5 mg total) by mouth daily.    Dispense:  90 tablet    Refill:  3  . DULoxetine (CYMBALTA) 60 MG capsule    Sig: Take 1 capsule (60 mg total) by mouth daily.    Dispense:  90 capsule    Refill:  3  . atorvastatin (LIPITOR) 10 MG tablet    Sig: TAKE 1  TABLET BY MOUTH  DAILY AT 2 PM.    Dispense:  90 tablet    Refill:  3    Follow up plan: Return in about 6 months (around 01/12/2019) for DM A1c, specialist f/u (pain, GI, Neuro).  ANobie Putnam [facility] 07/14/2018, 3:38 PM

## 2018-07-15 ENCOUNTER — Encounter: Payer: Self-pay | Admitting: Family Medicine

## 2018-07-15 NOTE — Assessment & Plan Note (Signed)
Followed by [facility] Neurology / Podiatry / [facility] - on Butrans Patch OFF Lyrica Continues on Gabapentin [phone] (recently updated), Cymbalta 9 Also on Tamezepam, and off seroquel for insomnia  Limited options at this point as discussed, requesting that he continue f/u with his specialists Neurology/Podiatry for further intervention. Unlikely to benefit from Cymbalta 90 but could consider if needs to. Limited gabapentin titration now, prior failure Lyrica.

## 2018-07-15 NOTE — Assessment & Plan Note (Signed)
BMI >31, some improved wt, with fluctuation. Overall improved diet/lifestyle Encourage continue improved diet and exercise as tolerated

## 2018-07-15 NOTE — Assessment & Plan Note (Signed)
Improved control Last lipid panel 07/2018 Calculated ASCVD 10 yr risk score elevated  Plan: Continue current meds - Atorvastatin 10mg  for now - may adjust dose Encourage improved lifestyle - low carb/cholesterol, reduce portion size, continue improving regular exercise

## 2018-07-15 NOTE — Assessment & Plan Note (Signed)
Improved DM A1c 6.9, from 7.7. Now off steroids/prednisone Reduced hyperglycemia Complications - peripheral neuropathy (also secondary to multiple back surgeries, with nerve damage), Hyperlipidemia, GERD  Plan:  1. Continue current therapy - Metformin 1000mg  BID 2. Encourage improved lifestyle - low carb, low sugar diet, reduce portion size, continue improving regular exercise 3. Check CBG, bring log to next visit for review 4. Continue ACEi, Statin 5. Advised to schedule DM ophtho exam, send record 6. Follow-up 6 months A1c - future consider adjust meds may add GLP1 if indicated

## 2018-07-21 ENCOUNTER — Other Ambulatory Visit: Payer: Self-pay

## 2018-07-21 ENCOUNTER — Other Ambulatory Visit: Payer: Self-pay | Admitting: Urology

## 2018-07-21 DIAGNOSIS — R51 Headache: Principal | ICD-10-CM

## 2018-07-21 DIAGNOSIS — R519 Headache, unspecified: Secondary | ICD-10-CM

## 2018-07-22 MED ORDER — "NEEDLE (DISP) 21G X 1-1/2"" MISC": 0 refills | Status: DC

## 2018-07-22 MED ORDER — SYRINGE (DISPOSABLE) 3 ML MISC: 0 refills | Status: DC

## 2018-07-24 MED ORDER — BUTALBITAL-APAP-CAFFEINE 50-325-40 MG PO TABS: 1.0000 | ORAL_TABLET | Freq: Every day | ORAL | 2 refills | Status: DC | PRN

## 2018-07-26 ENCOUNTER — Other Ambulatory Visit: Payer: Self-pay | Admitting: Family Medicine

## 2018-07-26 DIAGNOSIS — E1140 Type 2 diabetes mellitus with diabetic neuropathy, unspecified: Secondary | ICD-10-CM

## 2018-07-27 ENCOUNTER — Other Ambulatory Visit: Payer: Self-pay | Admitting: Family Medicine

## 2018-07-27 DIAGNOSIS — E785 Hyperlipidemia, unspecified: Secondary | ICD-10-CM

## 2018-08-03 ENCOUNTER — Other Ambulatory Visit: Payer: Self-pay

## 2018-08-03 NOTE — Telephone Encounter (Signed)
Attempted to contact the pt to confirm his pharmacy, no answer. LMOM to return my call.

## 2018-08-05 ENCOUNTER — Telehealth: Payer: Self-pay | Admitting: Family Medicine

## 2018-08-05 DIAGNOSIS — E119 Type 2 diabetes mellitus without complications: Secondary | ICD-10-CM | POA: Diagnosis not present

## 2018-08-05 DIAGNOSIS — G894 Chronic pain syndrome: Secondary | ICD-10-CM | POA: Diagnosis not present

## 2018-08-05 DIAGNOSIS — Z0289 Encounter for other administrative examinations: Secondary | ICD-10-CM | POA: Diagnosis not present

## 2018-08-05 DIAGNOSIS — M961 Postlaminectomy syndrome, not elsewhere classified: Secondary | ICD-10-CM | POA: Diagnosis not present

## 2018-08-05 NOTE — Telephone Encounter (Signed)
Left message for patient to call back  

## 2018-08-05 NOTE — Telephone Encounter (Signed)
Pt return call. Pt call back # is  [phone]

## 2018-08-05 NOTE — Telephone Encounter (Signed)
Received phone call from patient advised his question.

## 2018-08-27 ENCOUNTER — Other Ambulatory Visit: Payer: Self-pay | Admitting: Family Medicine

## 2018-08-27 DIAGNOSIS — F5101 Primary insomnia: Secondary | ICD-10-CM

## 2018-08-27 DIAGNOSIS — F331 Major depressive disorder, recurrent, moderate: Secondary | ICD-10-CM

## 2018-08-28 MED ORDER — ZOLPIDEM TARTRATE 5 MG PO TABS: 5.0000 mg | ORAL_TABLET | Freq: Every evening | ORAL | 2 refills | Status: DC | PRN

## 2018-08-28 MED ORDER — BUPROPION HCL ER (XL) 150 MG PO TB24: 150.0000 mg | ORAL_TABLET | Freq: Every day | ORAL | 1 refills | Status: DC

## 2018-08-28 MED ORDER — DULOXETINE HCL 30 MG PO CPEP: 30.0000 mg | ORAL_CAPSULE | Freq: Every day | ORAL | 0 refills | Status: DC

## 2018-09-07 ENCOUNTER — Telehealth: Payer: Self-pay | Admitting: Family Medicine

## 2018-09-07 DIAGNOSIS — M961 Postlaminectomy syndrome, not elsewhere classified: Secondary | ICD-10-CM

## 2018-09-07 DIAGNOSIS — G603 Idiopathic progressive neuropathy: Secondary | ICD-10-CM

## 2018-09-07 DIAGNOSIS — G894 Chronic pain syndrome: Secondary | ICD-10-CM

## 2018-09-07 NOTE — Telephone Encounter (Signed)
Pt. Called requesting a referral to [facility]  Dr. Remus Loffler. Pt call back # [phone]

## 2018-09-08 NOTE — Telephone Encounter (Signed)
   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

## 2018-09-10 ENCOUNTER — Telehealth: Payer: Self-pay | Admitting: Urology

## 2018-09-10 DIAGNOSIS — N401 Enlarged prostate with lower urinary tract symptoms: Secondary | ICD-10-CM

## 2018-09-10 DIAGNOSIS — R7989 Other specified abnormal findings of blood chemistry: Secondary | ICD-10-CM

## 2018-09-10 NOTE — Telephone Encounter (Signed)
Patient called the office today.  He is in need of a prescription for his testosterone.    He needs a [facility] [place] [place] and a 3 month sent to [facility].   He can be reached at [phone].

## 2018-09-11 MED ORDER — TESTOSTERONE CYPIONATE 200 MG/ML IM SOLN: 200.0000 mg | INTRAMUSCULAR | 0 refills | Status: DC

## 2018-09-11 NOTE — Telephone Encounter (Signed)
I can send in the 1 month appointment.  He has not been seen since starting testosterone.  He needs a follow-up appointment and labs.

## 2018-09-15 NOTE — Telephone Encounter (Signed)
Called pt informed him the information below. Scheduled pt for f/u appt and lab appt. Lab orders placed.

## 2018-09-15 NOTE — Addendum Note (Signed)
Addended by: Donalee Citrin on: 09/15/2018 12:21 PM   Modules accepted: Orders

## 2018-09-17 ENCOUNTER — Telehealth: Payer: Self-pay | Admitting: Family Medicine

## 2018-09-17 NOTE — Telephone Encounter (Signed)
Pt said [facility] did not receive referral.  Please fax to [phone].  His call back number is [phone]

## 2018-09-18 NOTE — Telephone Encounter (Signed)
Patient paperwork send is in review takes upto 3 weeks left message for patient.

## 2018-10-05 ENCOUNTER — Other Ambulatory Visit: Payer: Self-pay

## 2018-10-09 ENCOUNTER — Ambulatory Visit: Payer: Self-pay | Admitting: Urology

## 2018-10-09 ENCOUNTER — Other Ambulatory Visit: Payer: Self-pay | Admitting: Family Medicine

## 2018-10-09 DIAGNOSIS — L309 Dermatitis, unspecified: Secondary | ICD-10-CM

## 2018-10-13 ENCOUNTER — Other Ambulatory Visit: Payer: Self-pay | Admitting: Family Medicine

## 2018-10-13 DIAGNOSIS — R109 Unspecified abdominal pain: Secondary | ICD-10-CM | POA: Diagnosis not present

## 2018-10-13 DIAGNOSIS — K219 Gastro-esophageal reflux disease without esophagitis: Secondary | ICD-10-CM | POA: Diagnosis not present

## 2018-10-13 DIAGNOSIS — G8929 Other chronic pain: Secondary | ICD-10-CM

## 2018-10-13 DIAGNOSIS — R51 Headache: Principal | ICD-10-CM

## 2018-10-13 DIAGNOSIS — K760 Fatty (change of) liver, not elsewhere classified: Secondary | ICD-10-CM | POA: Diagnosis not present

## 2018-10-13 MED ORDER — BUTALBITAL-APAP-CAFFEINE 50-325-40 MG PO TABS: 1.0000 | ORAL_TABLET | Freq: Every day | ORAL | 2 refills | Status: AC | PRN

## 2018-10-16 ENCOUNTER — Other Ambulatory Visit: Payer: 59

## 2018-10-16 DIAGNOSIS — R7989 Other specified abnormal findings of blood chemistry: Secondary | ICD-10-CM | POA: Diagnosis not present

## 2018-10-17 LAB — HEMOGLOBIN AND HEMATOCRIT, BLOOD
HEMATOCRIT: 41.5 % (ref 37.5–51.0)
Hemoglobin: 13.2 g/dL (ref 13.0–17.7)

## 2018-10-17 LAB — FSH/LH
FSH: 2.8 m[IU]/mL (ref 1.5–12.4)
LH: 3.1 m[IU]/mL (ref 1.7–8.6)

## 2018-10-17 LAB — TESTOSTERONE: Testosterone: 843 ng/dL (ref 264–916)

## 2018-10-21 ENCOUNTER — Encounter: Payer: Self-pay | Admitting: Urology

## 2018-10-21 ENCOUNTER — Ambulatory Visit: Payer: Self-pay | Admitting: Urology

## 2018-11-09 DIAGNOSIS — M25561 Pain in right knee: Secondary | ICD-10-CM | POA: Diagnosis not present

## 2018-11-09 DIAGNOSIS — G8921 Chronic pain due to trauma: Secondary | ICD-10-CM | POA: Diagnosis not present

## 2018-11-09 DIAGNOSIS — M1711 Unilateral primary osteoarthritis, right knee: Secondary | ICD-10-CM | POA: Diagnosis not present

## 2018-11-09 DIAGNOSIS — M961 Postlaminectomy syndrome, not elsewhere classified: Secondary | ICD-10-CM | POA: Diagnosis not present

## 2018-11-09 DIAGNOSIS — G8929 Other chronic pain: Secondary | ICD-10-CM | POA: Diagnosis not present

## 2018-11-09 DIAGNOSIS — M25562 Pain in left knee: Secondary | ICD-10-CM | POA: Diagnosis not present

## 2018-11-09 DIAGNOSIS — M1712 Unilateral primary osteoarthritis, left knee: Secondary | ICD-10-CM | POA: Diagnosis not present

## 2018-11-17 ENCOUNTER — Encounter: Payer: Self-pay | Admitting: *Deleted

## 2018-11-18 ENCOUNTER — Encounter
Admission: RE | Disposition: A | Discharge status: Home / Self Care | Payer: Self-pay | Source: Home / Self Care | Attending: Unknown Physician Specialty

## 2018-11-18 ENCOUNTER — Ambulatory Visit
Admission: RE | Admit: 2018-11-18 | Discharge: 2018-11-18 | Disposition: A | Discharge status: Home / Self Care | Payer: 59 | Attending: Unknown Physician Specialty | Admitting: Unknown Physician Specialty

## 2018-11-18 ENCOUNTER — Encounter: Payer: Self-pay | Admitting: *Deleted

## 2018-11-18 ENCOUNTER — Ambulatory Visit: Payer: 59 | Admitting: Certified Registered"

## 2018-11-18 DIAGNOSIS — K219 Gastro-esophageal reflux disease without esophagitis: Secondary | ICD-10-CM | POA: Insufficient documentation

## 2018-11-18 DIAGNOSIS — E781 Pure hyperglyceridemia: Secondary | ICD-10-CM | POA: Diagnosis not present

## 2018-11-18 DIAGNOSIS — Z79899 Other long term (current) drug therapy: Secondary | ICD-10-CM | POA: Diagnosis not present

## 2018-11-18 DIAGNOSIS — Z79890 Hormone replacement therapy (postmenopausal): Secondary | ICD-10-CM | POA: Insufficient documentation

## 2018-11-18 DIAGNOSIS — K648 Other hemorrhoids: Secondary | ICD-10-CM | POA: Diagnosis not present

## 2018-11-18 DIAGNOSIS — K640 First degree hemorrhoids: Secondary | ICD-10-CM | POA: Diagnosis not present

## 2018-11-18 DIAGNOSIS — Z7984 Long term (current) use of oral hypoglycemic drugs: Secondary | ICD-10-CM | POA: Diagnosis not present

## 2018-11-18 DIAGNOSIS — Z87891 Personal history of nicotine dependence: Secondary | ICD-10-CM | POA: Insufficient documentation

## 2018-11-18 DIAGNOSIS — E119 Type 2 diabetes mellitus without complications: Secondary | ICD-10-CM | POA: Insufficient documentation

## 2018-11-18 DIAGNOSIS — I10 Essential (primary) hypertension: Secondary | ICD-10-CM | POA: Diagnosis not present

## 2018-11-18 DIAGNOSIS — H409 Unspecified glaucoma: Secondary | ICD-10-CM | POA: Diagnosis not present

## 2018-11-18 DIAGNOSIS — R109 Unspecified abdominal pain: Secondary | ICD-10-CM | POA: Diagnosis not present

## 2018-11-18 DIAGNOSIS — E291 Testicular hypofunction: Secondary | ICD-10-CM | POA: Diagnosis not present

## 2018-11-18 DIAGNOSIS — Z1211 Encounter for screening for malignant neoplasm of colon: Secondary | ICD-10-CM | POA: Insufficient documentation

## 2018-11-18 DIAGNOSIS — K635 Polyp of colon: Secondary | ICD-10-CM | POA: Insufficient documentation

## 2018-11-18 DIAGNOSIS — D123 Benign neoplasm of transverse colon: Secondary | ICD-10-CM | POA: Diagnosis not present

## 2018-11-18 DIAGNOSIS — Z7951 Long term (current) use of inhaled steroids: Secondary | ICD-10-CM | POA: Diagnosis not present

## 2018-11-18 DIAGNOSIS — I2510 Atherosclerotic heart disease of native coronary artery without angina pectoris: Secondary | ICD-10-CM | POA: Diagnosis not present

## 2018-11-18 DIAGNOSIS — F419 Anxiety disorder, unspecified: Secondary | ICD-10-CM | POA: Diagnosis not present

## 2018-11-18 DIAGNOSIS — E785 Hyperlipidemia, unspecified: Secondary | ICD-10-CM | POA: Diagnosis not present

## 2018-11-18 DIAGNOSIS — F329 Major depressive disorder, single episode, unspecified: Secondary | ICD-10-CM | POA: Insufficient documentation

## 2018-11-18 DIAGNOSIS — Z8371 Family history of colonic polyps: Secondary | ICD-10-CM | POA: Insufficient documentation

## 2018-11-18 DIAGNOSIS — F909 Attention-deficit hyperactivity disorder, unspecified type: Secondary | ICD-10-CM | POA: Insufficient documentation

## 2018-11-18 DIAGNOSIS — Z7982 Long term (current) use of aspirin: Secondary | ICD-10-CM | POA: Diagnosis not present

## 2018-11-18 DIAGNOSIS — K2960 Other gastritis without bleeding: Secondary | ICD-10-CM | POA: Diagnosis not present

## 2018-11-18 HISTORY — DX: Fatty (change of) liver, not elsewhere classified: K76.0

## 2018-11-18 HISTORY — DX: Unspecified glaucoma: H40.9

## 2018-11-18 HISTORY — DX: Headache: R51

## 2018-11-18 HISTORY — DX: Narcolepsy with cataplexy: G47.411

## 2018-11-18 HISTORY — DX: Type 2 diabetes mellitus without complications: E11.9

## 2018-11-18 HISTORY — DX: Pure hyperglyceridemia: E78.1

## 2018-11-18 HISTORY — DX: Testicular hypofunction: E29.1

## 2018-11-18 HISTORY — PX: COLONOSCOPY WITH PROPOFOL: SHX5780

## 2018-11-18 HISTORY — DX: Atherosclerotic heart disease of native coronary artery without angina pectoris: I25.10

## 2018-11-18 HISTORY — DX: Gastro-esophageal reflux disease without esophagitis: K21.9

## 2018-11-18 HISTORY — DX: Depression, unspecified: F32.A

## 2018-11-18 HISTORY — DX: Major depressive disorder, single episode, unspecified: F32.9

## 2018-11-18 HISTORY — PX: ESOPHAGOGASTRODUODENOSCOPY (EGD) WITH PROPOFOL: SHX5813

## 2018-11-18 HISTORY — DX: Hyperlipidemia, unspecified: E78.5

## 2018-11-18 HISTORY — DX: Nonrheumatic mitral (valve) prolapse: I34.1

## 2018-11-18 HISTORY — DX: Headache, unspecified: R51.9

## 2018-11-18 LAB — GLUCOSE, CAPILLARY: Glucose-Capillary: 118 mg/dL — ABNORMAL HIGH (ref 70–99)

## 2018-11-18 SURGERY — COLONOSCOPY WITH PROPOFOL
Anesthesia: General

## 2018-11-18 MED ORDER — MIDAZOLAM HCL 2 MG/2ML IJ SOLN
INTRAMUSCULAR | Status: DC | PRN
  Administered 2018-11-18 (×2): 1 mg via INTRAVENOUS

## 2018-11-18 MED ORDER — PROPOFOL 10 MG/ML IV BOLUS
INTRAVENOUS | Status: DC | PRN
  Administered 2018-11-18: 20 mg via INTRAVENOUS
  Administered 2018-11-18: 30 mg via INTRAVENOUS
  Administered 2018-11-18: 20 mg via INTRAVENOUS
  Administered 2018-11-18: 50 mg via INTRAVENOUS

## 2018-11-18 MED ORDER — PROPOFOL 500 MG/50ML IV EMUL
INTRAVENOUS | Status: DC | PRN
  Administered 2018-11-18: 100 ug/kg/min via INTRAVENOUS

## 2018-11-18 MED ORDER — PROPOFOL 500 MG/50ML IV EMUL
INTRAVENOUS | Status: AC
  Filled 2018-11-18: qty 50

## 2018-11-18 MED ORDER — LIDOCAINE HCL (PF) 2 % IJ SOLN
INTRAMUSCULAR | Status: AC
  Filled 2018-11-18: qty 10

## 2018-11-18 MED ORDER — LIDOCAINE HCL (CARDIAC) PF 100 MG/5ML IV SOSY
PREFILLED_SYRINGE | INTRAVENOUS | Status: DC | PRN
  Administered 2018-11-18: 80 mg via INTRATRACHEAL

## 2018-11-18 MED ORDER — SODIUM CHLORIDE 0.9 % IV SOLN
INTRAVENOUS | Status: DC
  Administered 2018-11-18: 08:00:00 via INTRAVENOUS

## 2018-11-18 MED ORDER — SODIUM CHLORIDE 0.9 % IV SOLN: INTRAVENOUS | Status: DC

## 2018-11-18 MED ORDER — FENTANYL CITRATE (PF) 100 MCG/2ML IJ SOLN
INTRAMUSCULAR | Status: AC
  Filled 2018-11-18: qty 2

## 2018-11-18 MED ORDER — MIDAZOLAM HCL 2 MG/2ML IJ SOLN
INTRAMUSCULAR | Status: AC
  Filled 2018-11-18: qty 2

## 2018-11-18 NOTE — H&P (Signed)
Primary Care Physician:  Olin Hauser, DO Primary Gastroenterologist:  Dr. Vira Agar  Pre-Procedure History & Physical: HPI:  Dale [name]. is a 58 y.o. male is here for an endoscopy and colonoscopy.   Past Medical History:  Diagnosis Date  . ADHD (attention deficit hyperactivity disorder)   . Anxiety   . Coronary artery disease   . Coronary artery disease   . Coronary atherosclerosis   . Depression   . Diabetes mellitus without complication ([place])   . GERD (gastroesophageal reflux disease)   . Glaucoma   . Headache    migrane  . Heart disease   . Hyperlipidemia   . Hypertension   . Hypertriglyceridemia   . Hypogonadism in male   . MVP (mitral valve prolapse)   . Narcolepsy and cataplexy   . Non-alcoholic fatty liver disease     Past Surgical History:  Procedure Laterality Date  . pain pump implant and removal Bilateral    Dec. 21 2019  . SPINE SURGERY     Multiple surgeries, initial 2001, thoracic disc repair and recurrent revisions.  . THROAT SURGERY      Prior to Admission medications   Medication Sig Start Date End Date Taking? Authorizing Provider  ALPRAZolam Duanne Moron) 1 MG tablet Take 1 mg by mouth 3 (three) times daily. 07/21/17  Yes Sherri Rad, MD  amLODipine (NORVASC) 5 MG tablet Take 5 mg by mouth daily.   Yes [provider]  aspirin EC 81 MG tablet Take 81 mg by mouth daily.   Yes [provider]  atorvastatin (LIPITOR) 10 MG tablet TAKE 1 TABLET BY MOUTH  DAILY AT 2 PM. 07/14/18  Yes Karamalegos, Devonne Doughty, DO  DULoxetine (CYMBALTA) 30 MG capsule Take 1 capsule (30 mg total) by mouth daily. Take with '60mg'$  capsule for total daily dose of '90mg'$  08/28/18  Yes Karamalegos, Devonne Doughty, DO  gabapentin (NEURONTIN) 300 MG capsule Take 1 capsule (300 mg total) by mouth 3 (three) times daily. 06/25/18  Yes Karamalegos, Devonne Doughty, DO  hydrochlorothiazide (HYDRODIURIL) 25 MG tablet TAKE 1 TABLET BY MOUTH EVERY DAY 07/26/18  Yes  Karamalegos, Devonne Doughty, DO  lisinopril (PRINIVIL,ZESTRIL) 5 MG tablet Take 1 tablet (5 mg total) by mouth daily. 07/14/18  Yes Karamalegos, Devonne Doughty, DO  metFORMIN (GLUCOPHAGE) 1000 MG tablet Take 1 tablet (1,000 mg total) by mouth 2 (two) times daily with a meal. 07/14/18  Yes Karamalegos, Alexander J, DO  Naloxone HCl 0.4 MG/0.4ML SOAJ Inject as directed.   Yes [provider]  pantoprazole (PROTONIX) 40 MG tablet Take 40 mg by mouth 2 (two) times daily. 02/07/18  Yes [provider]  zolpidem (AMBIEN) 5 MG tablet Take 1 tablet (5 mg total) by mouth at bedtime as needed for sleep. 08/28/18  Yes Karamalegos, Devonne Doughty, DO  acetaminophen (TYLENOL) 500 MG tablet Take by mouth.    [provider]  albuterol (PROVENTIL HFA;VENTOLIN HFA) 108 (90 Base) MCG/ACT inhaler Inhale 2 puffs into the lungs every 4 (four) hours as needed for wheezing or shortness of breath (cough). Patient not taking: Reported on 11/18/2018 02/09/18   Olin Hauser, DO  amphetamine-dextroamphetamine (ADDERALL XR) 10 MG 24 hr capsule Take 10 mg by mouth daily.    [provider]  b complex vitamins capsule Take by mouth.    [provider]  Blood Glucose Monitoring Suppl ([place]) w/Device KIT  06/26/16   [provider]  buPROPion (WELLBUTRIN XL) 300 MG  24 hr tablet Take 1 tablet (300 mg total) by mouth daily. 08/28/18   Karamalegos, Devonne Doughty, DO  butalbital-acetaminophen-caffeine (FIORICET, ESGIC) [phone] MG tablet Take 1 tablet by mouth daily as needed for headache. 10/13/18 10/13/19  Olin Hauser, DO  cholecalciferol (VITAMIN D) 400 units TABS tablet Take 400 Units by mouth.    [provider]  co-enzyme Q-10 30 MG capsule Take 30 mg by mouth 3 (three) times daily.    [provider]  desonide (DESOWEN) 0.05 % cream Apply topically 2 (two) times daily as needed. 01/30/16   Plonk, Gwyndolyn Saxon, MD  desoximetasone  (TOPICORT) 0.25 % cream APPLY 1 APPLICATION TOPICALLY 2 (TWO) TIMES DAILY AS NEEDED. 10/10/18   Parks Ranger, Devonne Doughty, DO  diclofenac sodium (VOLTAREN) 1 % GEL Apply topically. 04/10/18   [provider]  dicyclomine (BENTYL) 20 MG tablet TAKE 1 TABLET (20 MG TOTAL) BY MOUTH EVERY 6 (SIX) HOURS 04/07/18   [provider]  DULoxetine (CYMBALTA) 60 MG capsule Take 1 capsule (60 mg total) by mouth daily. 07/14/18   Karamalegos, Devonne Doughty, DO  Flaxseed, Linseed, (FLAXSEED OIL) 1000 MG CAPS Take 1 capsule (1,000 mg total) by mouth daily. 07/30/16   Karamalegos, Devonne Doughty, DO  fluticasone (FLONASE) 50 MCG/ACT nasal spray Place 2 sprays into both nostrils daily.    [provider]  glucose blood (ONETOUCH VERIO) test strip Check blood sugar up to 3x daily. 07/18/16   Karamalegos, Devonne Doughty, DO  hydrOXYzine (VISTARIL) 25 MG capsule 25 mg. 07/21/15   [provider]  latanoprost (XALATAN) 0.005 % ophthalmic solution Place 1 drop into both eyes at bedtime. 09/01/14   [provider]  lisinopril (PRINIVIL,ZESTRIL) 5 MG tablet TAKE 1 TABLET BY MOUTH EVERY DAY 07/26/18   Olin Hauser, DO  Multiple Vitamin (MULTIVITAMIN) tablet Take 1 tablet by mouth daily.    [provider]  NEEDLE, DISP, 18 G (B-D DISP NEEDLE TW 18GX1") 18G X 1" MISC For use to draw up medication into the syringe 02/09/18   Stoioff, Ronda Fairly, MD  NEEDLE, DISP, 21 G (BD SAFETYGLIDE SHIELDED NEEDLE) 21G X 1-1/2" MISC For use of administering medication in the IM location 07/22/18   Stoioff, Ronda Fairly, MD  Omega-3 Fatty Acids (FISH OIL) 1200 MG CAPS Take 1 capsule (1,200 mg total) by mouth daily. 07/30/16   Karamalegos, Devonne Doughty, DO  Phosphatidylserine-DHA-EPA 100-19.5-6.5 MG CAPS Take 1 capsule by mouth daily. 08/06/16   Olin Hauser, DO  Syringe, Disposable, 3 ML MISC Use syringes as directed by physician 07/22/18   Abbie Sons, MD  tamsulosin (FLOMAX) 0.4 MG CAPS  capsule Take 1 capsule (0.4 mg total) by mouth daily. 02/19/17   Ardis Hughs, MD  testosterone cypionate (DEPOTESTOSTERONE CYPIONATE) 200 MG/ML injection Inject 1 mL (200 mg total) into the muscle every 14 (fourteen) days. 09/11/18   Stoioff, Ronda Fairly, MD  zinc gluconate 50 MG tablet Take by mouth. 01/03/11   [provider]    Allergies as of 10/16/2018 - Review Complete 07/15/2018  Allergen Reaction Noted  . Benzoin Other (See Comments) 06/26/2015  . Doxycycline hyclate Nausea Only 04/30/2018  . Morphine Other (See Comments) 06/26/2015  . Penicillins Other (See Comments) 06/26/2015    Family History  Problem Relation Age of Onset  . Depression Mother   . Anxiety disorder Mother   . Diabetes Mother   . Heart failure Mother   . Heart attack Father   . Diabetes Sister   .  Obesity Sister   . Drug abuse Brother   . Alcohol abuse Brother   . Depression Sister   . Anxiety disorder Sister   . Drug abuse Sister   . Anxiety disorder Sister   . Seizures Sister   . Diabetes Sister   . Depression Sister     Social History   Socioeconomic History  . Marital status: Married    Spouse name: Not on file  . Number of children: Not on file  . Years of education: 1 yr of college  . Highest education level: Not on file  Occupational History  . Occupation: Disabled  Social Needs  . Financial resource strain: Not on file  . Food insecurity:    Worry: Not on file    Inability: Not on file  . Transportation needs:    Medical: Not on file    Non-medical: Not on file  Tobacco Use  . Smoking status: Former Smoker    Types: Cigarettes    Start date: 08/29/1979    Last attempt to quit: 11/04/2010    Years since quitting: 8.0  . Smokeless tobacco: Former Systems developer    Quit date: 11/19/2010  . Tobacco comment: 1 to 1.5 packs per day  Substance and Sexual Activity  . Alcohol use: No    Alcohol/week: 0.0 standard drinks  . Drug use: No  . Sexual activity: Not Currently  Lifestyle   . Physical activity:    Days per week: Not on file    Minutes per session: Not on file  . Stress: Not on file  Relationships  . Social connections:    Talks on phone: Not on file    Gets together: Not on file    Attends religious service: Not on file    Active member of club or organization: Not on file    Attends meetings of clubs or organizations: Not on file    Relationship status: Not on file  . Intimate partner violence:    Fear of current or ex partner: Not on file    Emotionally abused: Not on file    Physically abused: Not on file    Forced sexual activity: Not on file  Other Topics Concern  . Not on file  Social History Narrative  . Not on file    Review of Systems: See HPI, otherwise negative ROS  Physical Exam: BP (!) 146/103   Pulse 88   Temp (!) 97.3 F (36.3 C) (Tympanic)   Resp 16   Ht '5\' 11"'$  (1.803 m)   Wt 102.1 kg   SpO2 99%   BMI 31.38 kg/m  General:   Alert,  pleasant and cooperative in NAD Head:  Normocephalic and atraumatic. Neck:  Supple; no masses or thyromegaly. Lungs:  Clear throughout to auscultation.    Heart:  Regular rate and rhythm. Abdomen:  Soft, nontender and nondistended. Normal bowel sounds, without guarding, and without rebound.   Neurologic:  Alert and  oriented x4;  grossly normal neurologically.  Impression/Plan: [name] is here for an endoscopy and colonoscopy to be performed for FH colon polyps and GERD.Previous colonoscopy showed no polyps.  Risks, benefits, limitations, and alternatives regarding  endoscopy and colonoscopy have been reviewed with the patient.  Questions have been answered.  All parties agreeable.   Gaylyn Cheers, MD  11/18/2018, 7:29 AM

## 2018-11-18 NOTE — Op Note (Signed)
[facility] Gastroenterology Patient Name: Dale [name] Procedure Date: 11/18/2018 7:40 AM MRN: [id number] Account #: [id number] Date of Birth: 09/02/1961 Admit Type: Outpatient Age: 58 Room: [facility] ENDO ROOM 3 Gender: Male Note Status: Finalized Procedure:            Colonoscopy Indications:          Colon cancer screening in patient at increased risk:                        Family history of 1st-degree relative with colon polyps Providers:            Manya Silvas, MD Medicines:            Propofol per Anesthesia Complications:        No immediate complications. Procedure:            Pre-Anesthesia Assessment:                       - After reviewing the risks and benefits, the patient                        was deemed in satisfactory condition to undergo the                        procedure.                       After obtaining informed consent, the colonoscope was                        passed under direct vision. Throughout the procedure,                        the patient's blood pressure, pulse, and oxygen                        saturations were monitored continuously. The                        Colonoscope was introduced through the anus and                        advanced to the the cecum, identified by appendiceal                        orifice and ileocecal valve. The colonoscopy was                        performed without difficulty. The patient tolerated the                        procedure well. The quality of the bowel preparation                        was excellent. Findings:      A diminutive polyp was found in the splenic flexure. The polyp was       sessile. The polyp was removed with a jumbo cold forceps. Resection and       retrieval were complete.      Internal hemorrhoids were found during endoscopy. The hemorrhoids were  small and Grade I (internal hemorrhoids that do not prolapse).      The exam was otherwise without  abnormality. Impression:           - One diminutive polyp at the splenic flexure, removed                        with a jumbo cold forceps. Resected and retrieved.                       - Internal hemorrhoids.                       - The examination was otherwise normal. Recommendation:       - Await pathology results. Manya Silvas, MD 11/18/2018 8:23:45 AM This report has been signed electronically. Number of Addenda: 0 Note Initiated On: 11/18/2018 7:40 AM Scope Withdrawal Time: 0 hours 14 minutes 19 seconds  Total Procedure Duration: 0 hours 21 minutes 35 seconds       [facility]

## 2018-11-18 NOTE — Op Note (Signed)
[facility] Gastroenterology Patient Name: Dale [name] Procedure Date: 11/18/2018 7:40 AM MRN: [id number] Account #: [id number] Date of Birth: Oct 11, 1961 Admit Type: Outpatient Age: 58 Room: [facility] ENDO ROOM 3 Gender: Male Note Status: Finalized Procedure:            Upper GI endoscopy Indications:          Follow-up of gastro-esophageal reflux disease Providers:            Manya Silvas, MD Referring MD:         Olin Hauser (Referring MD) Medicines:            Propofol per Anesthesia Complications:        No immediate complications. Procedure:            Pre-Anesthesia Assessment:                       - After reviewing the risks and benefits, the patient                        was deemed in satisfactory condition to undergo the                        procedure.                       After obtaining informed consent, the endoscope was                        passed under direct vision. Throughout the procedure,                        the patient's blood pressure, pulse, and oxygen                        saturations were monitored continuously. The Endoscope                        was introduced through the mouth, and advanced to the                        second part of duodenum. The upper GI endoscopy was                        accomplished without difficulty. The patient tolerated                        the procedure well. Findings:      The examined esophagus was normal.      Patchy mildly erythematous mucosa without bleeding was found in the       gastric body. Biopsies were taken with a cold forceps for histology.       Biopsies were taken with a cold forceps for Helicobacter pylori testing.       Antrum normal.      The examined duodenum was normal. Impression:           - Normal esophagus.                       - Erythematous mucosa in the gastric body. Biopsied.                       -  Normal examined duodenum. Recommendation:       -  Await pathology results.                       - Perform a colonoscopy today. Manya Silvas, MD 11/18/2018 7:56:39 AM This report has been signed electronically. Number of Addenda: 0 Note Initiated On: 11/18/2018 7:40 AM      [facility]

## 2018-11-18 NOTE — Anesthesia Post-op Follow-up Note (Signed)
Anesthesia QCDR form completed.        

## 2018-11-18 NOTE — Anesthesia Postprocedure Evaluation (Signed)
Anesthesia Post Note  Patient: Dale [name].  Procedure(s) Performed: COLONOSCOPY WITH PROPOFOL (N/A ) ESOPHAGOGASTRODUODENOSCOPY (EGD) WITH PROPOFOL (N/A )  Patient location during evaluation: Endoscopy Anesthesia Type: General Level of consciousness: awake and alert Pain management: pain level controlled Vital Signs Assessment: post-procedure vital signs reviewed and stable Respiratory status: spontaneous breathing, nonlabored ventilation, respiratory function stable and patient connected to nasal cannula oxygen Cardiovascular status: blood pressure returned to baseline and stable Postop Assessment: no apparent nausea or vomiting Anesthetic complications: no     Last Vitals:  Vitals:   11/18/18 0836 11/18/18 0846  BP: 127/73 124/63  Pulse: 81 75  Resp: 19 14  Temp:    SpO2: 99% 98%    Last Pain:  Vitals:   11/18/18 0846  TempSrc:   PainSc: 4                  Precious Haws Careen Mauch

## 2018-11-18 NOTE — Anesthesia Preprocedure Evaluation (Signed)
Anesthesia Evaluation  Patient identified by MRN, date of birth, ID band Patient awake    Reviewed: Allergy & Precautions, H&P , NPO status , Patient's Chart, lab work & pertinent test results  History of Anesthesia Complications Negative for: history of anesthetic complications  Airway Mallampati: III  TM Distance: <3 FB Neck ROM: limited    Dental  (+) Chipped, Poor Dentition, Missing   Pulmonary neg pulmonary ROS, neg shortness of breath, former smoker,           Cardiovascular Exercise Tolerance: Good hypertension, (-) angina+ CAD  (-) Past MI and (-) DOE      Neuro/Psych  Headaches, PSYCHIATRIC DISORDERS  Neuromuscular disease    GI/Hepatic Neg liver ROS, GERD  Medicated and Controlled,  Endo/Other  diabetes, Type 2  Renal/GU negative Renal ROS  negative genitourinary   Musculoskeletal   Abdominal   Peds  Hematology negative hematology ROS (+)   Anesthesia Other Findings Past Medical History: No date: ADHD (attention deficit hyperactivity disorder) No date: Anxiety No date: Coronary artery disease No date: Coronary artery disease No date: Coronary atherosclerosis No date: Depression No date: Diabetes mellitus without complication ([facility]) No date: GERD (gastroesophageal reflux disease) No date: Glaucoma No date: Headache     Comment:  migrane No date: Heart disease No date: Hyperlipidemia No date: Hypertension No date: Hypertriglyceridemia No date: Hypogonadism in male No date: MVP (mitral valve prolapse) 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 21 2019 No date: SPINE SURGERY     Comment:  Multiple surgeries, initial 2001, thoracic disc repair               and recurrent revisions. No date: THROAT SURGERY  BMI    Body Mass Index:  31.38 kg/m      Reproductive/Obstetrics negative OB ROS                             Anesthesia Physical Anesthesia Plan  ASA: III  Anesthesia Plan: General   Post-op Pain Management:    Induction: Intravenous  PONV Risk Score and Plan: Propofol infusion and TIVA  Airway Management Planned: Natural Airway and Nasal Cannula  Additional Equipment:   Intra-op Plan:   Post-operative Plan:   Informed Consent: I have reviewed the patients History and Physical, chart, labs and discussed the procedure including the risks, benefits and alternatives for the proposed anesthesia with the patient or authorized representative who has indicated his/her understanding and acceptance.     Dental Advisory Given  Plan Discussed with: Anesthesiologist, CRNA and Surgeon  Anesthesia Plan Comments: (Patient consented for risks of anesthesia including but not limited to:  - adverse reactions to medications - risk of intubation if required - damage to teeth, lips or other oral mucosa - sore throat or hoarseness - Damage to heart, brain, lungs or loss of life  Patient voiced understanding.)        Anesthesia Quick Evaluation

## 2018-11-18 NOTE — Transfer of Care (Signed)
Immediate Anesthesia Transfer of Care Note  Patient: Dale [name].  Procedure(s) Performed: COLONOSCOPY WITH PROPOFOL (N/A ) ESOPHAGOGASTRODUODENOSCOPY (EGD) WITH PROPOFOL (N/A )  Patient Location: Endoscopy Unit  Anesthesia Type:General  Level of Consciousness: drowsy  Airway & Oxygen Therapy: Patient Spontanous Breathing and Patient connected to nasal cannula oxygen  Post-op Assessment: Report given to RN and Post -op Vital signs reviewed and stable  Post vital signs: stable  Last Vitals:  Vitals Value Taken Time  BP 133/64 11/18/2018  8:26 AM  Temp 36.1 C 11/18/2018  8:26 AM  Pulse 80 11/18/2018  8:29 AM  Resp 23 11/18/2018  8:29 AM  SpO2 98 % 11/18/2018  8:29 AM  Vitals shown include unvalidated device data.  Last Pain:  Vitals:   11/18/18 0826  TempSrc: Tympanic  PainSc: 7          Complications: No apparent anesthesia complications

## 2018-11-19 LAB — SURGICAL PATHOLOGY

## 2018-12-03 ENCOUNTER — Other Ambulatory Visit: Payer: Self-pay | Admitting: Student

## 2018-12-03 DIAGNOSIS — R109 Unspecified abdominal pain: Principal | ICD-10-CM

## 2018-12-03 DIAGNOSIS — K8020 Calculus of gallbladder without cholecystitis without obstruction: Secondary | ICD-10-CM

## 2018-12-03 DIAGNOSIS — K760 Fatty (change of) liver, not elsewhere classified: Secondary | ICD-10-CM

## 2018-12-03 DIAGNOSIS — G8929 Other chronic pain: Secondary | ICD-10-CM

## 2018-12-03 DIAGNOSIS — K219 Gastro-esophageal reflux disease without esophagitis: Secondary | ICD-10-CM | POA: Diagnosis not present

## 2018-12-04 ENCOUNTER — Ambulatory Visit (INDEPENDENT_AMBULATORY_CARE_PROVIDER_SITE_OTHER): Payer: 59 | Admitting: Urology

## 2018-12-04 ENCOUNTER — Encounter: Payer: Self-pay | Admitting: Urology

## 2018-12-04 VITALS — BP 144/72 | HR 94 | Ht 70.98 in | Wt 229.0 lb

## 2018-12-04 DIAGNOSIS — N401 Enlarged prostate with lower urinary tract symptoms: Secondary | ICD-10-CM | POA: Diagnosis not present

## 2018-12-04 DIAGNOSIS — R7989 Other specified abnormal findings of blood chemistry: Secondary | ICD-10-CM

## 2018-12-04 MED ORDER — TADALAFIL 20 MG PO TABS: ORAL_TABLET | ORAL | 1 refills | Status: DC

## 2018-12-04 NOTE — Progress Notes (Signed)
 12/04/2018 10:40 AM   Dale E Altemose Jr. 01/05/1961 [id number]  Referring provider: Karamalegos, Alexander J, DO [address] [place], [place] [address]  Chief Complaint  Patient presents with  . Hypogonadism    HPI: 57 year-old male presents for follow-up of hypogonadism.  He saw Dr. Budzyn in March 2019 for lower urinary tract symptoms and hypogonadism.  He had been on tamsulosin and his voiding symptoms had resolved.  He is no longer taking this medication.  He was started on testosterone in March and is currently injecting 200 mg every 2 weeks.  He has not had a follow-up since starting testosterone.  His initial symptoms were severe tiredness and fatigue and erectile dysfunction.  His tiredness/fatigue is significantly improved however his erectile dysfunction persists.  Blood work obtained last month remarkable for a testosterone level of 843, Hematocrit 41.5 and a PSA in September 2019 of 0.5  He complains of difficulty achieving an erection.  He has partial erections which are not firm enough for penetration.  He estimates his current erections at 30% of normal.  He denies pain with erection.  He states he has had mild curvature to the left for years which did not interfere with intercourse when he was sexually active.  Organic risk factors include coronary artery disease, diabetes, hypertension, hyperlipidemia, antihypertensive medications including lisinopril, hydrochlorothiazide.  He smoked for 39 years and quit 8 years ago.   PMH: Past Medical History:  Diagnosis Date  . ADHD (attention deficit hyperactivity disorder)   . Anxiety   . Coronary artery disease   . Coronary artery disease   . Coronary atherosclerosis   . Depression   . Diabetes mellitus without complication (HCC)   . GERD (gastroesophageal reflux disease)   . Glaucoma   . Headache    migrane  . Heart disease   . Hyperlipidemia   . Hypertension   . Hypertriglyceridemia   . Hypogonadism in male   . MVP  (mitral valve prolapse)   . Narcolepsy and cataplexy   . Non-alcoholic fatty liver disease     Surgical History: Past Surgical History:  Procedure Laterality Date  . COLONOSCOPY WITH PROPOFOL N/A 11/18/2018   Procedure: COLONOSCOPY WITH PROPOFOL;  Surgeon: Elliott, Robert T, MD;  Location: [facility];  Service: Endoscopy;  Laterality: N/A;  . ESOPHAGOGASTRODUODENOSCOPY (EGD) WITH PROPOFOL N/A 11/18/2018   Procedure: ESOPHAGOGASTRODUODENOSCOPY (EGD) WITH PROPOFOL;  Surgeon: Elliott, Robert T, MD;  Location: [facility] ENDOSCOPY;  Service: Endoscopy;  Laterality: N/A;  . pain pump implant and removal Bilateral    Dec. 21 2019  . SPINE SURGERY     Multiple surgeries, initial 2001, thoracic disc repair and recurrent revisions.  . THROAT SURGERY      Home Medications:  Allergies as of 12/04/2018      Reactions   Benzoin Other (See Comments)   blisters   Doxycycline Hyclate Nausea Only   Morphine Other (See Comments)   Penicillins Other (See Comments)   Tape Rash      Medication List       Accurate as of December 04, 2018 10:40 AM. Always use your most recent med list.        acetaminophen 500 MG tablet Commonly known as:  TYLENOL Take by mouth.   ALPRAZolam 1 MG tablet Commonly known as:  XANAX Take 1 mg by mouth 3 (three) times daily.   amLODipine 5 MG tablet Commonly known as:  NORVASC Take 5 mg by mouth daily.   amphetamine-dextroamphetamine 10   MG 24 hr capsule Commonly known as:  ADDERALL XR Take 10 mg by mouth daily.   aspirin EC 81 MG tablet Take 81 mg by mouth daily.   atorvastatin 10 MG tablet Commonly known as:  LIPITOR TAKE 1 TABLET BY MOUTH  DAILY AT 2 PM.   b complex vitamins capsule Take by mouth.   Buprenorphine 15 MCG/HR Ptwk Place onto the skin.   butalbital-acetaminophen-caffeine 50-325-40 MG tablet Commonly known as:  FIORICET, ESGIC Take 1 tablet by mouth daily as needed for headache.   cholecalciferol 10 MCG (400 UNIT) Tabs  tablet Commonly known as:  VITAMIN D3 Take 400 Units by mouth.   co-enzyme Q-10 30 MG capsule Take 30 mg by mouth 3 (three) times daily.   desoximetasone 0.25 % cream Commonly known as:  TOPICORT APPLY 1 APPLICATION TOPICALLY 2 (TWO) TIMES DAILY AS NEEDED.   diclofenac sodium 1 % Gel Commonly known as:  VOLTAREN Apply topically.   dicyclomine 20 MG tablet Commonly known as:  BENTYL TAKE 1 TABLET (20 MG TOTAL) BY MOUTH EVERY 6 (SIX) HOURS   DULoxetine 60 MG capsule Commonly known as:  CYMBALTA Take 1 capsule (60 mg total) by mouth daily.   DULoxetine 30 MG capsule Commonly known as:  CYMBALTA Take 1 capsule (30 mg total) by mouth daily. Take with 60mg capsule for total daily dose of 90mg   Fish Oil 1200 MG Caps Take 1 capsule (1,200 mg total) by mouth daily.   fluticasone 50 MCG/ACT nasal spray Commonly known as:  FLONASE Place 2 sprays into both nostrils daily.   gabapentin 300 MG capsule Commonly known as:  NEURONTIN Take 1 capsule (300 mg total) by mouth 3 (three) times daily.   glucose blood test strip Commonly known as:  ONETOUCH VERIO Check blood sugar up to 3x daily.   hydrochlorothiazide 25 MG tablet Commonly known as:  HYDRODIURIL TAKE 1 TABLET BY MOUTH EVERY DAY   hydrOXYzine 25 MG capsule Commonly known as:  VISTARIL 25 mg.   lisinopril 5 MG tablet Commonly known as:  PRINIVIL,ZESTRIL Take 1 tablet (5 mg total) by mouth daily.   lisinopril 5 MG tablet Commonly known as:  PRINIVIL,ZESTRIL TAKE 1 TABLET BY MOUTH EVERY DAY   LYRICA 50 MG capsule Generic drug:  pregabalin TAKE 1 CAPSULE (50 MG TOTAL) BY MOUTH 3 (THREE) TIMES DAILY.   metFORMIN 1000 MG tablet Commonly known as:  GLUCOPHAGE Take 1 tablet (1,000 mg total) by mouth 2 (two) times daily with a meal.   multivitamin tablet Take 1 tablet by mouth daily.   Naloxone HCl 0.4 MG/0.4ML Soaj Inject as directed.   NEEDLE (DISP) 18 G 18G X 1" Misc Commonly known as:  B-D DISP NEEDLE TW  18GX1" For use to draw up medication into the syringe   NEEDLE (DISP) 21 G 21G X 1-1/2" Misc Commonly known as:  BD SAFETYGLIDE SHIELDED NEEDLE For use of administering medication in the IM location   ONETOUCH VERIO FLEX SYSTEM w/Device Kit   pantoprazole 40 MG tablet Commonly known as:  PROTONIX Take 40 mg by mouth 2 (two) times daily.   REXULTI 2 MG Tabs Generic drug:  Brexpiprazole Take 1 tablet by mouth daily.   Syringe (Disposable) 3 ML Misc Use syringes as directed by physician   testosterone cypionate 200 MG/ML injection Commonly known as:  DEPOTESTOSTERONE CYPIONATE Inject 1 mL (200 mg total) into the muscle every 14 (fourteen) days.   tiZANidine 2 MG tablet Commonly known as:  ZANAFLEX     WELLBUTRIN XL 300 MG 24 hr tablet Generic drug:  buPROPion Take 1 tablet (300 mg total) by mouth daily.   zinc gluconate 50 MG tablet Take by mouth.   zolpidem 5 MG tablet Commonly known as:  AMBIEN Take 1 tablet (5 mg total) by mouth at bedtime as needed for sleep.       Allergies:  Allergies  Allergen Reactions  . Benzoin Other (See Comments)    blisters  . Doxycycline Hyclate Nausea Only  . Morphine Other (See Comments)  . Penicillins Other (See Comments)  . Tape Rash    Family History: Family History  Problem Relation Age of Onset  . Depression Mother   . Anxiety disorder Mother   . Diabetes Mother   . Heart failure Mother   . Heart attack Father   . Diabetes Sister   . Obesity Sister   . Drug abuse Brother   . Alcohol abuse Brother   . Depression Sister   . Anxiety disorder Sister   . Drug abuse Sister   . Anxiety disorder Sister   . Seizures Sister   . Diabetes Sister   . Depression Sister     Social History:  reports that he quit smoking about 8 years ago. His smoking use included cigarettes. He started smoking about 39 years ago. He quit smokeless tobacco use about 8 years ago. He reports that he does not drink alcohol or use  drugs.  ROS: UROLOGY Frequent Urination?: No Hard to postpone urination?: No Burning/pain with urination?: No Get up at night to urinate?: No Leakage of urine?: No Urine stream starts and stops?: No Trouble starting stream?: No Do you have to strain to urinate?: No Blood in urine?: No Urinary tract infection?: No Sexually transmitted disease?: No Injury to kidneys or bladder?: No Painful intercourse?: No Weak stream?: No Erection problems?: No Penile pain?: No  Gastrointestinal Nausea?: No Vomiting?: No Indigestion/heartburn?: No Diarrhea?: No Constipation?: No  Constitutional Fever: No Night sweats?: No Weight loss?: No Fatigue?: No  Skin Skin rash/lesions?: No Itching?: No  Eyes Blurred vision?: No Double vision?: No  Ears/Nose/Throat Sore throat?: No Sinus problems?: No  Hematologic/Lymphatic Swollen glands?: No Easy bruising?: No  Cardiovascular Leg swelling?: No Chest pain?: No  Respiratory Cough?: No Shortness of breath?: No  Endocrine Excessive thirst?: No  Musculoskeletal Back pain?: Yes Joint pain?: Yes  Neurological Headaches?: No Dizziness?: No  Psychologic Depression?: Yes Anxiety?: Yes  Physical Exam: BP (!) 144/72 (BP Location: Left Arm, Patient Position: Sitting, Cuff Size: Large)   Pulse 94   Ht 5' 10.98" (1.803 m)   Wt 229 lb (103.9 kg)   BMI 31.96 kg/m   Constitutional:  Alert and oriented, No acute distress. HEENT: [place] AT, moist mucus membranes.  Trachea midline, no masses. Cardiovascular: No clubbing, cyanosis, or edema. Respiratory: Normal respiratory effort, no increased work of breathing. GI: Abdomen is soft, nontender, nondistended, no abdominal masses GU: No CVA tenderness.  Penis without lesions, testes descended bilaterally without masses or tenderness.  No paratesticular abnormalities.  Prostate 40 g, smooth without nodules. Lymph: No cervical or inguinal lymphadenopathy. Skin: No rashes, bruises or  suspicious lesions. Neurologic: Grossly intact, no focal deficits, moving all 4 extremities. Psychiatric: Normal mood and affect.  Laboratory Data:  Lab Results  Component Value Date   TESTOSTERONE 843 10/16/2018    Assessment & Plan:   57-year-old male with hypogonadism and erectile dysfunction.  He has significant improvement in his energy level on TRT.  Recent   monitoring blood work looks good.  Testosterone was refilled.  Recommend a follow-up in 6 months.  He has significant organic risk factors for ED.  He was interested in PDE 5 inhibitor and was given an Rx for generic tadalafil 20 mg.  Return in about 6 months (around 06/04/2019) for Recheck.   C , MD  [facility] [address], [address] [place], [place] [address] [phone]  

## 2018-12-04 NOTE — Patient Instructions (Signed)
Testosterone Test  Why am I having this test?  Testosterone is a hormone made by the adrenal glands in the abdomen in both males and females. In males, it is also made by the testicles. Starting at puberty, testosterone stimulates the development of secondary sex characteristics in males. This includes a deeper voice, muscle and body hair growth, and penis enlargement.  In females, testosterone is also produced in the ovaries. The male body converts testosterone into estradiol, the main male sex hormone.  An abnormal level of testosterone can cause health issues in both males and females. You may have this test if your health care provider suspects that an abnormal testosterone level is causing or contributing to other health problems.  In males, an abnormally low testosterone level can cause:   Inability to have children (infertility).   Trouble getting or maintaining an erection (erectile dysfunction).   Delayed puberty.  In females, an abnormally high testosterone level can cause:   Infertility.   Polycystic ovary syndrome (PCOS).   Development of masculine features (virilization).  What is being tested?  This test measures the amount of total testosterone in your blood.  What kind of sample is taken?    A blood sample is required for this test. It is usually collected by inserting a needle into a blood vessel. The sample is most often collected in the morning because that is when testosterone is usually the highest.  Tell a health care provider about:   Any allergies you have.   All medicines you are taking, including vitamins, herbs, eye drops, creams, and over-the-counter medicines.   Any blood disorders you have.   Any surgeries you have had.   Any medical conditions you have.   Whether you are pregnant or may be pregnant.  How are the results reported?  Your test results will be reported as a value that indicates how much testosterone is in your blood. This will be given as nanograms of  testosterone per deciliter of blood (ng/dL).  Your health care provider will compare your test results to normal ranges that were established after testing a large group of people (reference ranges). Reference ranges may vary among labs and hospitals. For this test, common reference ranges for total testosterone are:   Male:  ? 7 months to 59 years old: less than 30 ng/dL.  ? 10-13 years old: less than 300 ng/dL.  ? 14-15 years old: 170-540 ng/dL.  ? 16-19 years old: 250-910 ng/dL.  ? 20 years and older: 280-1,080 ng/dL.   Male:  ? 7 months to 59 years old: less than 30 ng/dL.  ? 10-13 years old: less than 40 ng/dL.  ? 14-15 years old: less than 60 ng/dL.  ? 16-19 years old: less than 70 ng/dL.  ? 20 years and older: less than 70 ng/dL.  What do the results mean?  A result that is within your reference range means that you have a normal amount of testosterone in your blood.  In males:   A high testosterone level may mean that you:  ? Have certain types of tumors.  ? Have an overactive thyroid gland (hyperthyroidism).  ? Currently use anabolic steroids or used anabolic steroids in the past.  ? Have an inherited disorder that affects the adrenal glands (congenital adrenal hyperplasia).  ? Are starting puberty early (precocious puberty).   A low testosterone level may mean that you:  ? Have certain genetic diseases.  ? Have had certain viral infections, such as   mumps.  ? Have a condition that affects the pituitary gland.  ? Have injured your testicles.  In females:   A high testosterone level may mean that you have:  ? Certain types of tumors, such as ovarian or adrenal gland tumors.  ? An inherited disorder that affects certain cells in the adrenal glands (congenital adrenal hyperplasia).  ? Polycystic ovary syndrome.   A low testosterone level usually will not cause health problems.  Talk with your health care provider about what your results mean.  Questions to ask your health care provider  Ask your health  care provider, or the department that is doing the test:   When will my results be ready?   How will I get my results?   What are my treatment options?   What other tests do I need?   What are my next steps?  Summary   Testosterone is a hormone made by the adrenal glands in the abdomen in both males and females. In males, it is also made by the testicles. Starting at puberty, testosterone stimulates the development of secondary sex characteristics in males.   In females, testosterone is also produced in the ovaries. The male body converts testosterone into estradiol, the main male sex hormone.   An abnormal level of testosterone can cause health issues in both males and females. You may have this test if your health care provider suspects that an abnormal testosterone level is causing or contributing to other health problems.  This information is not intended to replace advice given to you by your health care provider. Make sure you discuss any questions you have with your health care provider.  Document Released: 11/07/2004 Document Revised: 07/22/2017 Document Reviewed: 07/22/2017  [employer]vier Interactive Patient Education  2019 [employer]

## 2018-12-06 ENCOUNTER — Encounter: Payer: Self-pay | Admitting: Urology

## 2018-12-06 MED ORDER — TESTOSTERONE CYPIONATE 200 MG/ML IM SOLN: 200.0000 mg | INTRAMUSCULAR | 0 refills | Status: DC

## 2018-12-06 NOTE — Progress Notes (Signed)
12/04/2018 10:40 AM   Dale [name]. 10-Dec-1960 [id number]  Referring provider: Olin Hauser, DO [address] [place], [place] [address]  Chief Complaint  Patient presents with  . Hypogonadism    HPI: 58 year-old male presents for follow-up of hypogonadism.  He saw Dr. Pilar Jarvis in March 2019 for lower urinary tract symptoms and hypogonadism.  He had been on tamsulosin and his voiding symptoms had resolved.  He is no longer taking this medication.  He was started on testosterone in March and is currently injecting 200 mg every 2 weeks.  He has not had a follow-up since starting testosterone.  His initial symptoms were severe tiredness and fatigue and erectile dysfunction.  His tiredness/fatigue is significantly improved however his erectile dysfunction persists.  Blood work obtained last month remarkable for a testosterone level of 843, Hematocrit 41.5 and a PSA in September 2019 of 0.5  He complains of difficulty achieving an erection.  He has partial erections which are not firm enough for penetration.  He estimates his current erections at 30% of normal.  He denies pain with erection.  He states he has had mild curvature to the left for years which did not interfere with intercourse when he was sexually active.  Organic risk factors include coronary artery disease, diabetes, hypertension, hyperlipidemia, antihypertensive medications including lisinopril, hydrochlorothiazide.  He smoked for 39 years and quit 8 years ago.   PMH: Past Medical History:  Diagnosis Date  . ADHD (attention deficit hyperactivity disorder)   . Anxiety   . Coronary artery disease   . Coronary artery disease   . Coronary atherosclerosis   . Depression   . Diabetes mellitus without complication ([place])   . GERD (gastroesophageal reflux disease)   . Glaucoma   . Headache    migrane  . Heart disease   . Hyperlipidemia   . Hypertension   . Hypertriglyceridemia   . Hypogonadism in male   . MVP  (mitral valve prolapse)   . Narcolepsy and cataplexy   . Non-alcoholic fatty liver disease     Surgical History: Past Surgical History:  Procedure Laterality Date  . COLONOSCOPY WITH PROPOFOL N/A 11/18/2018   Procedure: COLONOSCOPY WITH PROPOFOL;  Surgeon: Manya Silvas, MD;  Location: [facility] ENDOSCOPY;  Service: Endoscopy;  Laterality: N/A;  . ESOPHAGOGASTRODUODENOSCOPY (EGD) WITH PROPOFOL N/A 11/18/2018   Procedure: ESOPHAGOGASTRODUODENOSCOPY (EGD) WITH PROPOFOL;  Surgeon: Manya Silvas, MD;  Location: [facility] ENDOSCOPY;  Service: Endoscopy;  Laterality: N/A;  . pain pump implant and removal Bilateral    Dec. 21 2019  . SPINE SURGERY     Multiple surgeries, initial 2001, thoracic disc repair and recurrent revisions.  . THROAT SURGERY      Home Medications:  Allergies as of 12/04/2018      Reactions   Benzoin Other (See Comments)   blisters   Doxycycline Hyclate Nausea Only   Morphine Other (See Comments)   Penicillins Other (See Comments)   Tape Rash      Medication List       Accurate as of December 04, 2018 10:40 AM. Always use your most recent med list.        acetaminophen 500 MG tablet Commonly known as:  TYLENOL Take by mouth.   ALPRAZolam 1 MG tablet Commonly known as:  XANAX Take 1 mg by mouth 3 (three) times daily.   amLODipine 5 MG tablet Commonly known as:  NORVASC Take 5 mg by mouth daily.   amphetamine-dextroamphetamine 10  MG 24 hr capsule Commonly known as:  ADDERALL XR Take 10 mg by mouth daily.   aspirin EC 81 MG tablet Take 81 mg by mouth daily.   atorvastatin 10 MG tablet Commonly known as:  LIPITOR TAKE 1 TABLET BY MOUTH  DAILY AT 2 PM.   b complex vitamins capsule Take by mouth.   Buprenorphine 15 MCG/HR Ptwk Place onto the skin.   butalbital-acetaminophen-caffeine 50-325-40 MG tablet Commonly known as:  FIORICET, ESGIC Take 1 tablet by mouth daily as needed for headache.   cholecalciferol 10 MCG (400 UNIT) Tabs  tablet Commonly known as:  VITAMIN D3 Take 400 Units by mouth.   co-enzyme Q-10 30 MG capsule Take 30 mg by mouth 3 (three) times daily.   desoximetasone 0.25 % cream Commonly known as:  TOPICORT APPLY 1 APPLICATION TOPICALLY 2 (TWO) TIMES DAILY AS NEEDED.   diclofenac sodium 1 % Gel Commonly known as:  VOLTAREN Apply topically.   dicyclomine 20 MG tablet Commonly known as:  BENTYL TAKE 1 TABLET (20 MG TOTAL) BY MOUTH EVERY 6 (SIX) HOURS   DULoxetine 60 MG capsule Commonly known as:  CYMBALTA Take 1 capsule (60 mg total) by mouth daily.   DULoxetine 30 MG capsule Commonly known as:  CYMBALTA Take 1 capsule (30 mg total) by mouth daily. Take with 18m capsule for total daily dose of 949m  Fish Oil 1200 MG Caps Take 1 capsule (1,200 mg total) by mouth daily.   fluticasone 50 MCG/ACT nasal spray Commonly known as:  FLONASE Place 2 sprays into both nostrils daily.   gabapentin 300 MG capsule Commonly known as:  NEURONTIN Take 1 capsule (300 mg total) by mouth 3 (three) times daily.   glucose blood test strip Commonly known as:  ONETOUCH VERIO Check blood sugar up to 3x daily.   hydrochlorothiazide 25 MG tablet Commonly known as:  HYDRODIURIL TAKE 1 TABLET BY MOUTH EVERY DAY   hydrOXYzine 25 MG capsule Commonly known as:  VISTARIL 25 mg.   lisinopril 5 MG tablet Commonly known as:  PRINIVIL,ZESTRIL Take 1 tablet (5 mg total) by mouth daily.   lisinopril 5 MG tablet Commonly known as:  PRINIVIL,ZESTRIL TAKE 1 TABLET BY MOUTH EVERY DAY   LYRICA 50 MG capsule Generic drug:  pregabalin TAKE 1 CAPSULE (50 MG TOTAL) BY MOUTH 3 (THREE) TIMES DAILY.   metFORMIN 1000 MG tablet Commonly known as:  GLUCOPHAGE Take 1 tablet (1,000 mg total) by mouth 2 (two) times daily with a meal.   multivitamin tablet Take 1 tablet by mouth daily.   Naloxone HCl 0.4 MG/0.4ML Soaj Inject as directed.   NEEDLE (DISP) 18 G 18G X 1" Misc Commonly known as:  B-D DISP NEEDLE TW  18GX1" For use to draw up medication into the syringe   NEEDLE (DISP) 21 G 21G X 1-1/2" Misc Commonly known as:  BD SAFETYGLIDE SHIELDED NEEDLE For use of administering medication in the IM location   ONETOUCH VERIO FLEX SYSTEM w/Device Kit   pantoprazole 40 MG tablet Commonly known as:  PROTONIX Take 40 mg by mouth 2 (two) times daily.   REXULTI 2 MG Tabs Generic drug:  Brexpiprazole Take 1 tablet by mouth daily.   Syringe (Disposable) 3 ML Misc Use syringes as directed by physician   testosterone cypionate 200 MG/ML injection Commonly known as:  DEPOTESTOSTERONE CYPIONATE Inject 1 mL (200 mg total) into the muscle every 14 (fourteen) days.   tiZANidine 2 MG tablet Commonly known as:  ZANAFLEX  WELLBUTRIN XL 300 MG 24 hr tablet Generic drug:  buPROPion Take 1 tablet (300 mg total) by mouth daily.   zinc gluconate 50 MG tablet Take by mouth.   zolpidem 5 MG tablet Commonly known as:  AMBIEN Take 1 tablet (5 mg total) by mouth at bedtime as needed for sleep.       Allergies:  Allergies  Allergen Reactions  . Benzoin Other (See Comments)    blisters  . Doxycycline Hyclate Nausea Only  . Morphine Other (See Comments)  . Penicillins Other (See Comments)  . Tape Rash    Family History: Family History  Problem Relation Age of Onset  . Depression Mother   . Anxiety disorder Mother   . Diabetes Mother   . Heart failure Mother   . Heart attack Father   . Diabetes Sister   . Obesity Sister   . Drug abuse Brother   . Alcohol abuse Brother   . Depression Sister   . Anxiety disorder Sister   . Drug abuse Sister   . Anxiety disorder Sister   . Seizures Sister   . Diabetes Sister   . Depression Sister     Social History:  reports that he quit smoking about 8 years ago. His smoking use included cigarettes. He started smoking about 39 years ago. He quit smokeless tobacco use about 8 years ago. He reports that he does not drink alcohol or use  drugs.  ROS: UROLOGY Frequent Urination?: No Hard to postpone urination?: No Burning/pain with urination?: No Get up at night to urinate?: No Leakage of urine?: No Urine stream starts and stops?: No Trouble starting stream?: No Do you have to strain to urinate?: No Blood in urine?: No Urinary tract infection?: No Sexually transmitted disease?: No Injury to kidneys or bladder?: No Painful intercourse?: No Weak stream?: No Erection problems?: No Penile pain?: No  Gastrointestinal Nausea?: No Vomiting?: No Indigestion/heartburn?: No Diarrhea?: No Constipation?: No  Constitutional Fever: No Night sweats?: No Weight loss?: No Fatigue?: No  Skin Skin rash/lesions?: No Itching?: No  Eyes Blurred vision?: No Double vision?: No  Ears/Nose/Throat Sore throat?: No Sinus problems?: No  Hematologic/Lymphatic Swollen glands?: No Easy bruising?: No  Cardiovascular Leg swelling?: No Chest pain?: No  Respiratory Cough?: No Shortness of breath?: No  Endocrine Excessive thirst?: No  Musculoskeletal Back pain?: Yes Joint pain?: Yes  Neurological Headaches?: No Dizziness?: No  Psychologic Depression?: Yes Anxiety?: Yes  Physical Exam: BP (!) 144/72 (BP Location: Left Arm, Patient Position: Sitting, Cuff Size: Large)   Pulse 94   Ht 5' 10.98" (1.803 m)   Wt 229 lb (103.9 kg)   BMI 31.96 kg/m   Constitutional:  Alert and oriented, No acute distress. HEENT: [place] AT, moist mucus membranes.  Trachea midline, no masses. Cardiovascular: No clubbing, cyanosis, or edema. Respiratory: Normal respiratory effort, no increased work of breathing. GI: Abdomen is soft, nontender, nondistended, no abdominal masses GU: No CVA tenderness.  Penis without lesions, testes descended bilaterally without masses or tenderness.  No paratesticular abnormalities.  Prostate 40 g, smooth without nodules. Lymph: No cervical or inguinal lymphadenopathy. Skin: No rashes, bruises or  suspicious lesions. Neurologic: Grossly intact, no focal deficits, moving all 4 extremities. Psychiatric: Normal mood and affect.  Laboratory Data:  Lab Results  Component Value Date   TESTOSTERONE 782 10/16/2018    Assessment & Plan:   58 year old male with hypogonadism and erectile dysfunction.  He has significant improvement in his energy level on TRT.  Recent  monitoring blood work looks good.  Testosterone was refilled.  Recommend a follow-up in 6 months.  He has significant organic risk factors for ED.  He was interested in PDE 5 inhibitor and was given an Rx for generic tadalafil 20 mg.  Return in about 6 months (around 06/04/2019) for Recheck.  Abbie Sons, [place] [address] [place], [place] [address] [phone]

## 2018-12-10 DIAGNOSIS — M545 Low back pain: Secondary | ICD-10-CM | POA: Diagnosis not present

## 2018-12-10 DIAGNOSIS — K760 Fatty (change of) liver, not elsewhere classified: Secondary | ICD-10-CM | POA: Diagnosis not present

## 2018-12-10 DIAGNOSIS — G8929 Other chronic pain: Secondary | ICD-10-CM | POA: Diagnosis not present

## 2018-12-10 DIAGNOSIS — R200 Anesthesia of skin: Secondary | ICD-10-CM | POA: Diagnosis not present

## 2018-12-17 ENCOUNTER — Encounter
Admission: RE | Admit: 2018-12-17 | Discharge: 2018-12-17 | Disposition: A | Discharge status: Home / Self Care | Payer: 59 | Source: Ambulatory Visit | Attending: Student | Admitting: Student

## 2018-12-17 ENCOUNTER — Ambulatory Visit
Admission: RE | Admit: 2018-12-17 | Discharge: 2018-12-17 | Disposition: A | Discharge status: Home / Self Care | Payer: 59 | Source: Ambulatory Visit | Attending: Student | Admitting: Student

## 2018-12-17 DIAGNOSIS — K8020 Calculus of gallbladder without cholecystitis without obstruction: Secondary | ICD-10-CM | POA: Diagnosis not present

## 2018-12-17 DIAGNOSIS — G8929 Other chronic pain: Secondary | ICD-10-CM

## 2018-12-17 DIAGNOSIS — K760 Fatty (change of) liver, not elsewhere classified: Secondary | ICD-10-CM | POA: Insufficient documentation

## 2018-12-17 DIAGNOSIS — R109 Unspecified abdominal pain: Secondary | ICD-10-CM | POA: Insufficient documentation

## 2018-12-17 MED ORDER — TECHNETIUM TC 99M MEBROFENIN IV KIT
5.4200 | PACK | Freq: Once | INTRAVENOUS | Status: AC | PRN
  Administered 2018-12-17: 5.42 via INTRAVENOUS

## 2018-12-20 IMAGING — DX DG CHEST 2V
3 series · 3 of 3 positions shown · non-contrast
Comparison: 10/10/2017, 05/07/2010

CLINICAL DATA: Right pleural effusion.  Productive cough.

EXAM:
CHEST  2 VIEW

[chest pa (1 of 2)]
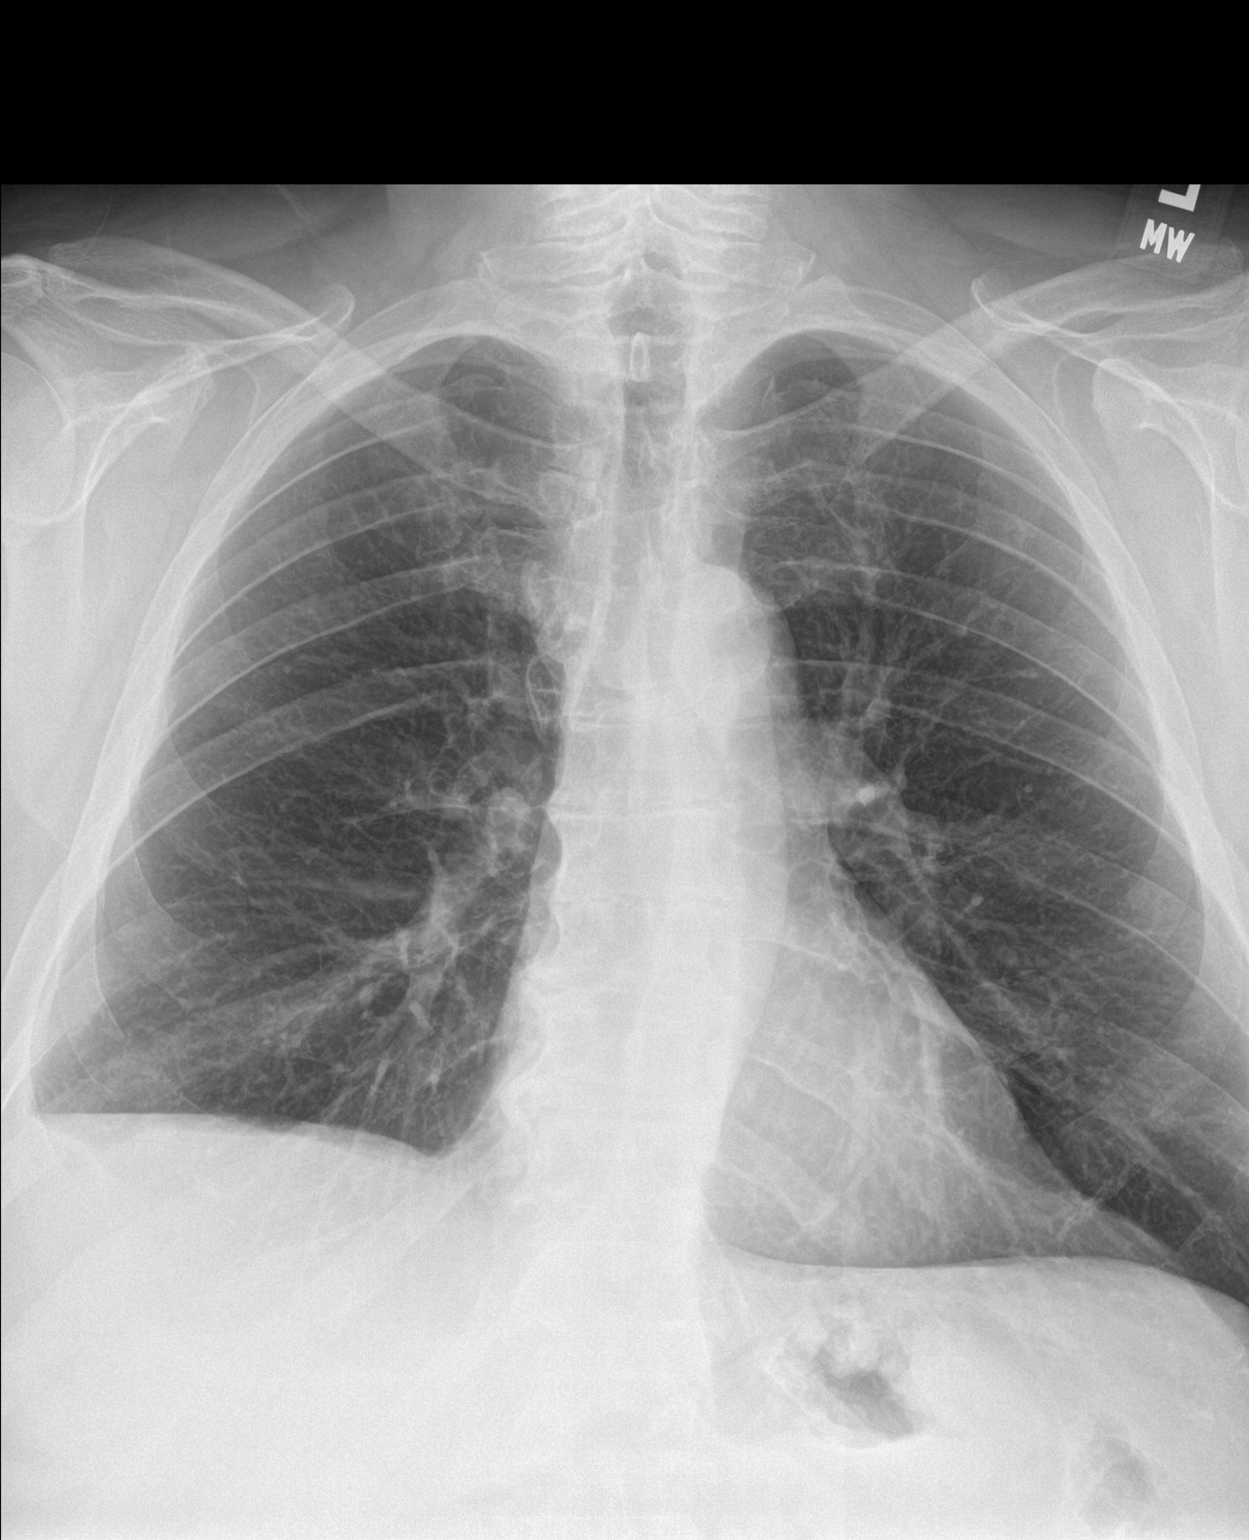

[chest lat]
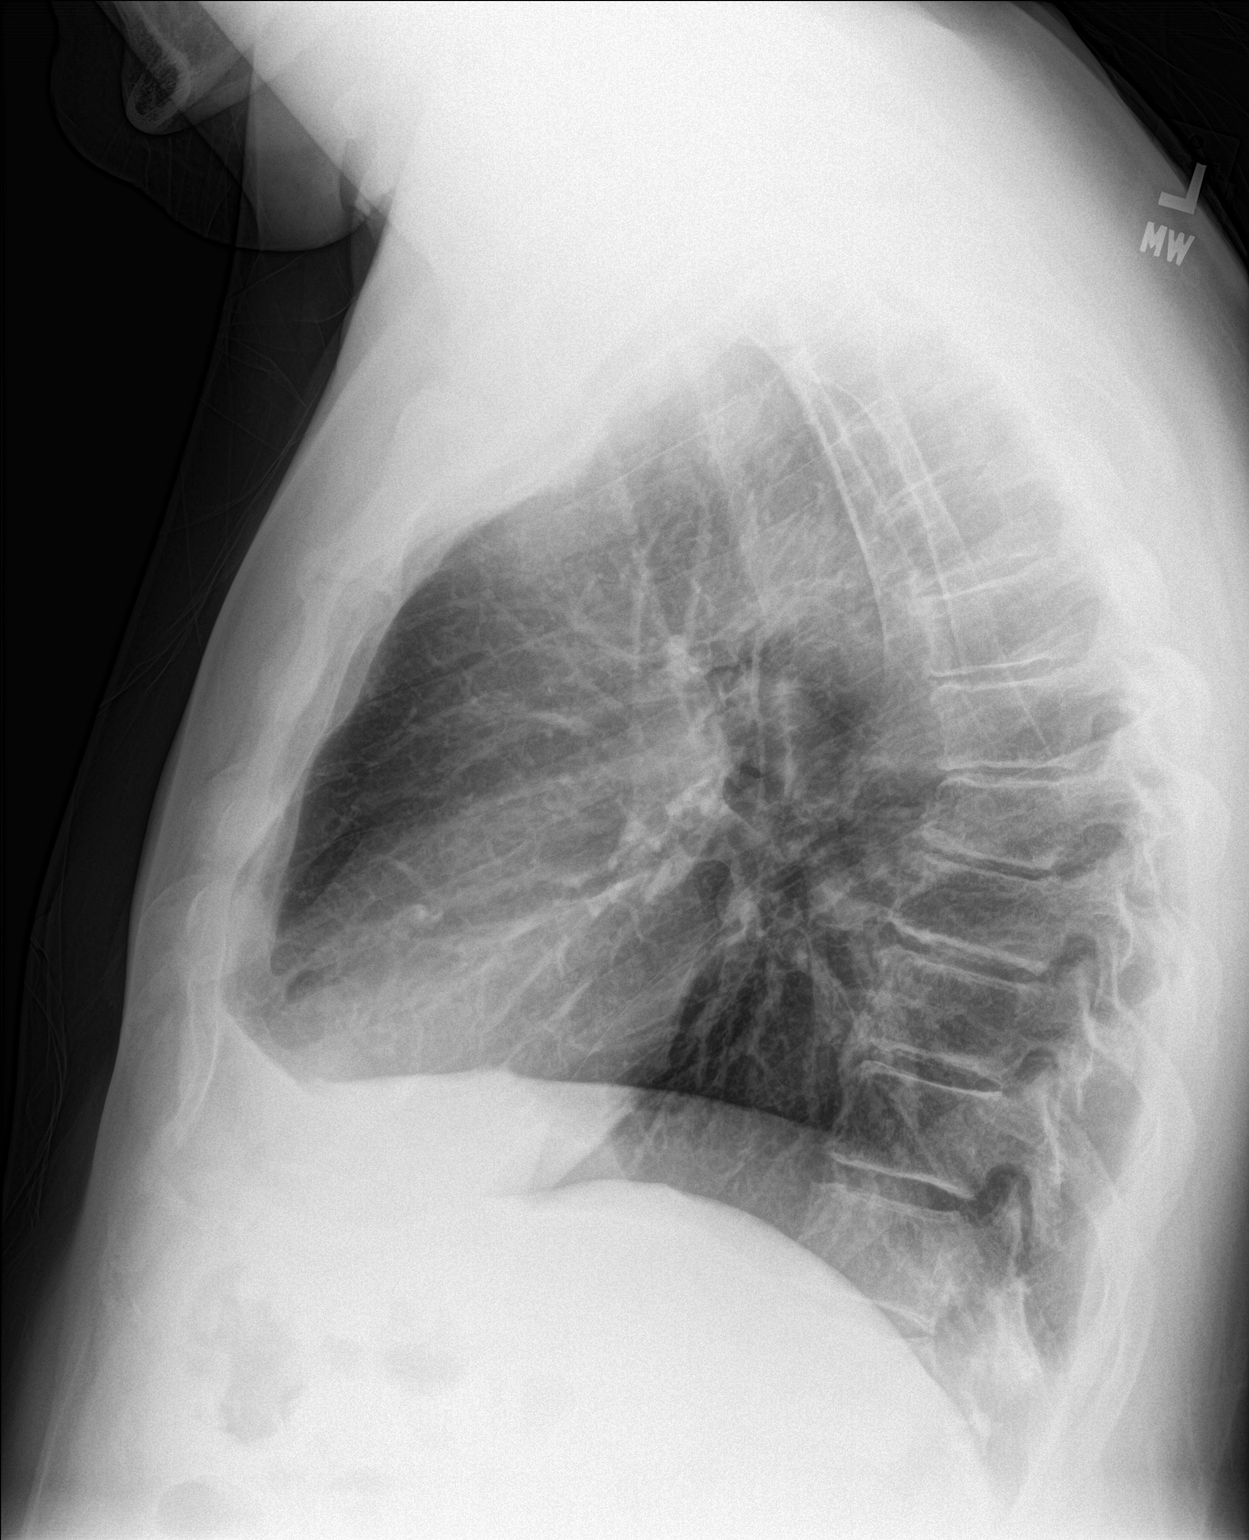

[chest pa (2 of 2)]
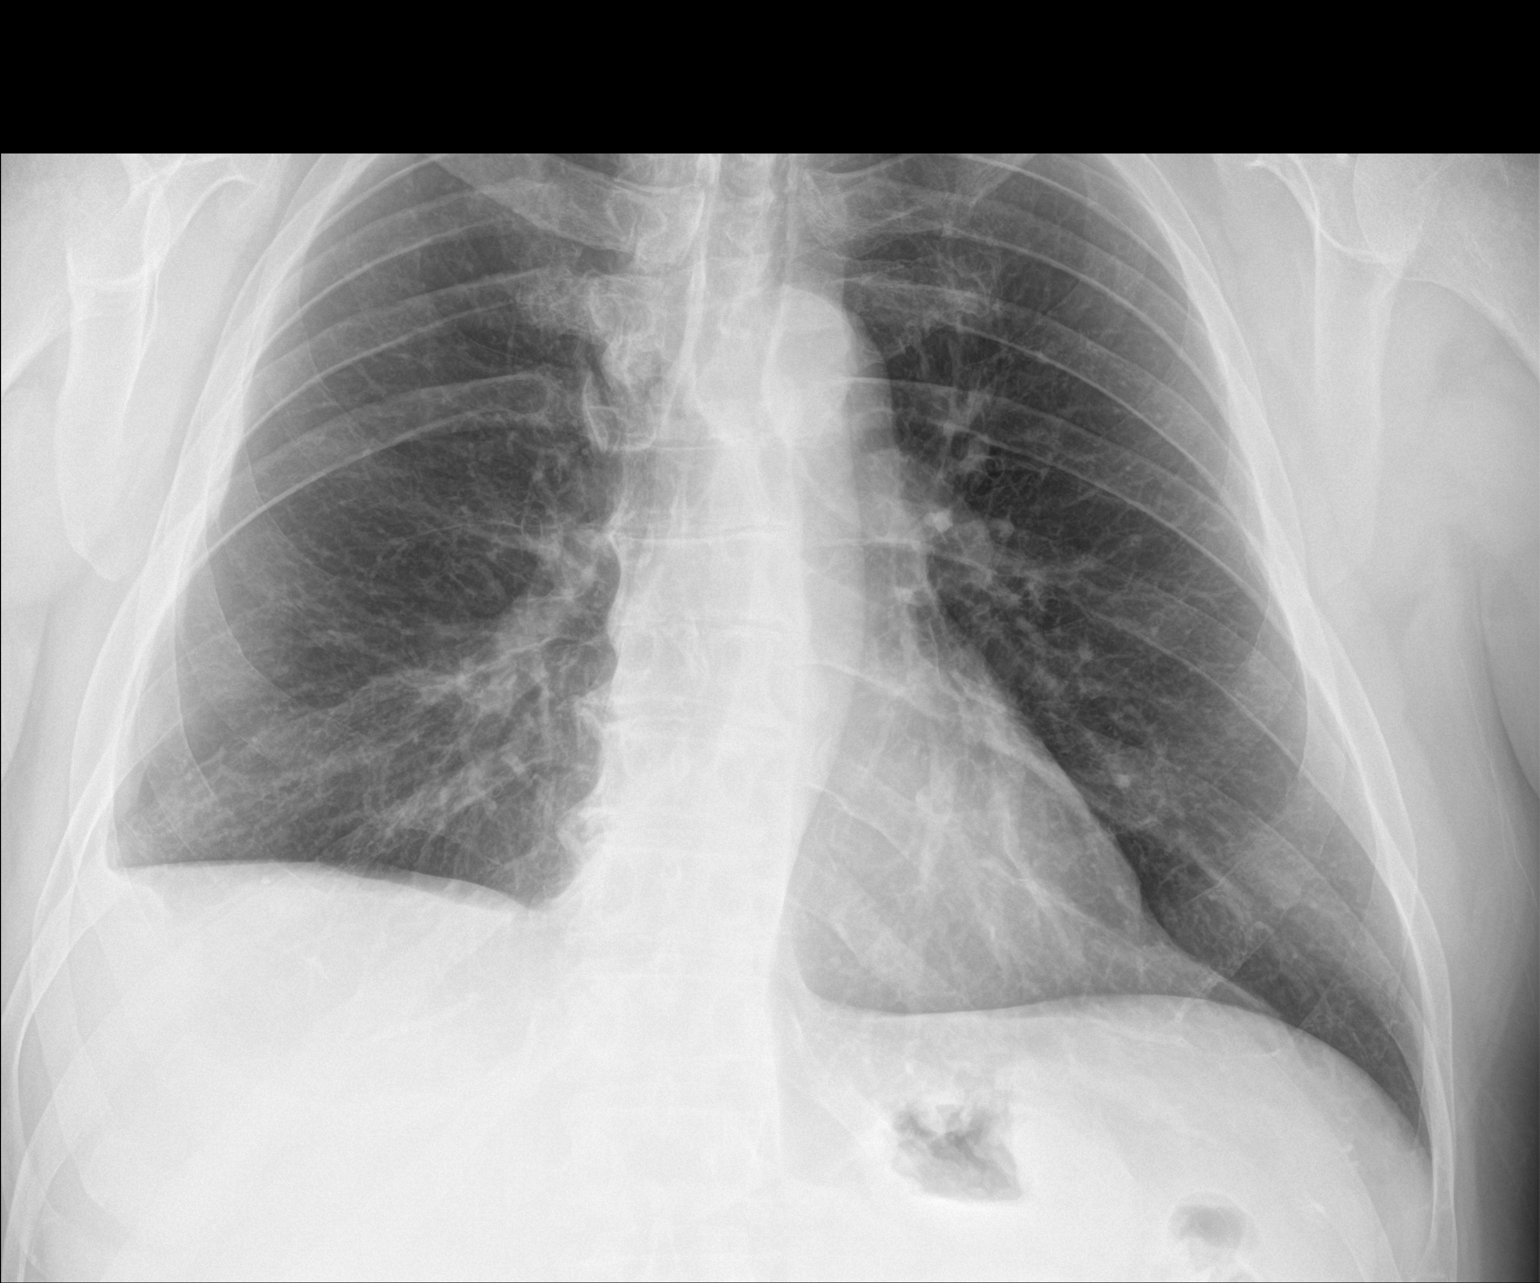

[3 of 3 positions shown; findings below may reference images not displayed]

FINDINGS: There is no focal parenchymal opacity. There is chronic blunting of
the right costophrenic angle likely reflecting scarring. There is no
significant right pleural effusion. There is no left pleural
effusion. There is no pneumothorax. The heart and mediastinal
contours are unremarkable.

The osseous structures are unremarkable.
IMPRESSION: 1. There is chronic blunting of the right costophrenic angle likely
reflecting scarring. There is no significant right pleural effusion.
The overall appearance is unchanged compared with prior x-ray dated
05/07/2010.

## 2019-01-14 ENCOUNTER — Encounter: Payer: Self-pay | Admitting: Family Medicine

## 2019-01-14 ENCOUNTER — Ambulatory Visit (INDEPENDENT_AMBULATORY_CARE_PROVIDER_SITE_OTHER): Payer: 59 | Admitting: Family Medicine

## 2019-01-14 ENCOUNTER — Other Ambulatory Visit: Payer: Self-pay

## 2019-01-14 ENCOUNTER — Other Ambulatory Visit: Payer: Self-pay | Admitting: Family Medicine

## 2019-01-14 VITALS — BP 149/89 | HR 91 | Temp 98.7°F | Resp 16 | Ht 70.0 in | Wt 228.0 lb

## 2019-01-14 DIAGNOSIS — Z125 Encounter for screening for malignant neoplasm of prostate: Secondary | ICD-10-CM

## 2019-01-14 DIAGNOSIS — E1169 Type 2 diabetes mellitus with other specified complication: Secondary | ICD-10-CM

## 2019-01-14 DIAGNOSIS — I10 Essential (primary) hypertension: Secondary | ICD-10-CM

## 2019-01-14 DIAGNOSIS — E1140 Type 2 diabetes mellitus with diabetic neuropathy, unspecified: Secondary | ICD-10-CM | POA: Diagnosis not present

## 2019-01-14 DIAGNOSIS — Z0000 Encounter for general adult medical examination without abnormal findings: Secondary | ICD-10-CM

## 2019-01-14 DIAGNOSIS — K760 Fatty (change of) liver, not elsewhere classified: Secondary | ICD-10-CM

## 2019-01-14 DIAGNOSIS — G894 Chronic pain syndrome: Secondary | ICD-10-CM

## 2019-01-14 DIAGNOSIS — T2153XA Corrosion of first degree of upper back, initial encounter: Secondary | ICD-10-CM

## 2019-01-14 DIAGNOSIS — E785 Hyperlipidemia, unspecified: Secondary | ICD-10-CM

## 2019-01-14 DIAGNOSIS — E538 Deficiency of other specified B group vitamins: Secondary | ICD-10-CM

## 2019-01-14 LAB — POCT GLYCOSYLATED HEMOGLOBIN (HGB A1C): Hemoglobin A1C: 6.9 % — AB (ref 4.0–5.6)

## 2019-01-14 MED ORDER — SILVER SULFADIAZINE 1 % EX CREA: 1.0000 "application " | TOPICAL_CREAM | Freq: Every day | CUTANEOUS | 0 refills | Status: DC

## 2019-01-14 NOTE — Assessment & Plan Note (Signed)
Recent imaging shows persistent fatty liver infiltration, and fibrascan shows moderate risk fibrosis - Concerns for at risk cirrhosis Followed by [facility], has had other work-up EGD and NM HIDA  Plan - Reviewed prior imaging results with patient, dating back to 11/2006 that showed similar infiltration, difficult to compare different test, but on several past ultrasound radiology report it is commented on having fatty infiltration or hepatic steatosis, advised patient reassurance this may not be new, but may be chronic problem - Recommend avoid alcohol still, manage cholesterol and lifestyle wt - Follow with [facility] still to discuss treatment options
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
## 2019-01-14 NOTE — Assessment & Plan Note (Signed)
Stable DM A1c 6.9, remains controlled Resolved hyperglycemia Complications - peripheral neuropathy (also secondary to multiple back surgeries, with nerve damage), Hyperlipidemia, GERD  Plan:  1. Continue current therapy - Metformin 1000mg  BID 2. Encourage improved lifestyle - low carb, low sugar diet, reduce portion size, continue improving regular exercise 3. Check CBG, bring log to next visit for review 4. Continue ACEi, Statin 5. Request record from last DM ophtho exam, send record - [facility] [place] - ?Dr Wyatt Portela 6. Follow-up 6 months annual
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
## 2019-01-15 ENCOUNTER — Telehealth: Payer: Self-pay | Admitting: Family Medicine

## 2019-01-15 NOTE — Telephone Encounter (Signed)
We attempted to request Diabetic Eye Exam from [facility] Dr Wyatt Portela, patient said he went in 10/2018.  We received fax back stating that his last visit was 08/29/2010.  Could you call patient to ask if he can clarify name of his eye doctor and when / where? He will likely have to call the eye doctor office to request that they send it to our office via fax.  [name], [facility] 01/15/2019, 5:36 PM

## 2019-01-18 NOTE — Telephone Encounter (Signed)
Advised patient to call the opthalmologist and let them fax [place] the report from 08/2018.

## 2019-01-25 DIAGNOSIS — M961 Postlaminectomy syndrome, not elsewhere classified: Secondary | ICD-10-CM | POA: Diagnosis not present

## 2019-02-09 ENCOUNTER — Other Ambulatory Visit: Payer: Self-pay

## 2019-02-09 MED ORDER — TESTOSTERONE CYPIONATE 200 MG/ML IM SOLN: 200.0000 mg | INTRAMUSCULAR | 0 refills | Status: DC

## 2019-02-23 DIAGNOSIS — G6289 Other specified polyneuropathies: Secondary | ICD-10-CM | POA: Diagnosis not present

## 2019-02-23 DIAGNOSIS — M961 Postlaminectomy syndrome, not elsewhere classified: Secondary | ICD-10-CM | POA: Diagnosis not present

## 2019-02-23 DIAGNOSIS — G8921 Chronic pain due to trauma: Secondary | ICD-10-CM | POA: Diagnosis not present

## 2019-03-16 DIAGNOSIS — M961 Postlaminectomy syndrome, not elsewhere classified: Secondary | ICD-10-CM | POA: Diagnosis not present

## 2019-03-16 DIAGNOSIS — G6289 Other specified polyneuropathies: Secondary | ICD-10-CM | POA: Diagnosis not present

## 2019-03-16 DIAGNOSIS — G8921 Chronic pain due to trauma: Secondary | ICD-10-CM | POA: Diagnosis not present

## 2019-04-10 ENCOUNTER — Other Ambulatory Visit: Payer: Self-pay | Admitting: Family Medicine

## 2019-04-10 DIAGNOSIS — F5101 Primary insomnia: Secondary | ICD-10-CM

## 2019-04-10 DIAGNOSIS — F331 Major depressive disorder, recurrent, moderate: Secondary | ICD-10-CM

## 2019-04-13 ENCOUNTER — Encounter: Payer: Self-pay | Admitting: Family Medicine

## 2019-04-13 ENCOUNTER — Other Ambulatory Visit: Payer: Self-pay

## 2019-04-13 ENCOUNTER — Ambulatory Visit (INDEPENDENT_AMBULATORY_CARE_PROVIDER_SITE_OTHER): Payer: 59 | Admitting: Family Medicine

## 2019-04-13 VITALS — BP 128/80 | HR 93 | Temp 98.7°F | Resp 16 | Ht 70.0 in | Wt 236.0 lb

## 2019-04-13 DIAGNOSIS — E1140 Type 2 diabetes mellitus with diabetic neuropathy, unspecified: Secondary | ICD-10-CM

## 2019-04-13 DIAGNOSIS — L97512 Non-pressure chronic ulcer of other part of right foot with fat layer exposed: Secondary | ICD-10-CM

## 2019-04-13 DIAGNOSIS — E11621 Type 2 diabetes mellitus with foot ulcer: Secondary | ICD-10-CM

## 2019-04-13 MED ORDER — GLUCOSE BLOOD VI STRP: ORAL_STRIP | 3 refills | Status: DC

## 2019-04-13 MED ORDER — MUPIROCIN 2 % EX OINT: 1.0000 "application " | TOPICAL_OINTMENT | Freq: Two times a day (BID) | CUTANEOUS | 0 refills | Status: DC

## 2019-04-13 NOTE — Patient Instructions (Addendum)
Thank you for coming to the office today.  Use topical antibiotic rx to help prevent infection, can keep using alcohol cleanse daily, podiatry will work on debridement and can treat the toe area. Goal to offload pressure while walking.  [place] Address: [address] [place], [place] [address] Hours: Open 8AM-5PM Phone: [phone]  If spreading redness other concerns, or new issue with wound, may need wound care center, but likely not.  Notify sooner if need  Please schedule a Follow-up Appointment to: Return in about 2 weeks (around 04/27/2019), or if symptoms worsen or fail to improve, for toe / foot ulcer.  If you have any other questions or concerns, please feel free to call the office or send a message through [place]. You may also schedule an earlier appointment if necessary.  Additionally, you may be receiving a survey about your experience at our office within a few days to 1 week by e-mail or mail. We value your feedback.  [name], DO [place]

## 2019-04-13 NOTE — Progress Notes (Signed)
Subjective:    Patient ID: [name], male    DOB: Mar 20, 1961, 58 y.o.   MRN: [id number]  Sylvan Sookdeo. is a 58 y.o. male presenting on 04/13/2019 for Toe Pain (underneath of toe pain has open wound onset 3 weeks)   HPI  Right Foot Ulceration, Diabetic, Peripheral Neuropathy Chronic problem with type 2 diabetes mostly controlled, last A1c 6.9 in March 2020, he has had chronic complication with peripheral neuropathy both feet with dramatic reduced sensation and neuropathic pain among other chronic pain. He has callus formation and now here for evaluation of unroofed opened callus on R foot, onset 3 weeks ago with exposed wound ulcer after callus came off, has other callus on side of R great toe as well that he has been managing. He does not see a podiatrist now, but he saw one at [facility] previously for high arches of feet. He has used topical alcohol rub to help keep ulceration clean, and using dressing as well to keep covered and allow it to air dry at times. - Denies pain due to neuropathy unable to have good sensation, drainage of pus, fever chills, spreading redness   Depression screen [facility] 2/9 04/13/2019 01/14/2019 01/21/2018  Decreased Interest 0 2 2  Down, Depressed, Hopeless 1 2 2   PHQ - 2 Score 1 4 4   Altered sleeping 1 3 3   Tired, decreased energy 0 2 2  Change in appetite 0 3 3  Feeling bad or failure about yourself  0 1 1  Trouble concentrating 0 0 0  Moving slowly or fidgety/restless 0 0 0  Suicidal thoughts 0 0 1  PHQ-9 Score 2 13 14   Difficult doing work/chores Not difficult at all Somewhat difficult Not difficult at all    Social History   Tobacco Use  . Smoking status: Former Smoker    Types: Cigarettes    Start date: 08/29/1979    Last attempt to quit: 11/04/2010    Years since quitting: 8.4  . Smokeless tobacco: Former Systems developer    Quit date: 11/19/2010  . Tobacco comment: 1 to 1.5 packs per day  Substance Use Topics  . Alcohol use: No    Alcohol/week: 0.0  standard drinks  . Drug use: No    Review of Systems Per HPI unless specifically indicated above     Objective:    BP 128/80   Pulse 93   Temp 98.7 F (37.1 C) (Oral)   Resp 16   Ht 5\' 10"  (1.778 m)   Wt 236 lb (107 kg)   BMI 33.86 kg/m   Wt Readings from Last 3 Encounters:  04/13/19 236 lb (107 kg)  01/14/19 228 lb (103.4 kg)  12/04/18 229 lb (103.9 kg)    Physical Exam Vitals signs and nursing note reviewed.  Constitutional:      General: He is not in acute distress.    Appearance: He is well-developed. He is not diaphoretic.     Comments: Well-appearing, comfortable, cooperative  HENT:     Head: Normocephalic and atraumatic.  Eyes:     General:        Right eye: No discharge.        Left eye: No discharge.     Conjunctiva/sclera: Conjunctivae normal.  Cardiovascular:     Rate and Rhythm: Normal rate.  Pulmonary:     Effort: Pulmonary effort is normal.  Skin:    General: Skin is warm and dry.     Findings: No  erythema or rash.     Comments: R foot callus multiple locations medial aspect of great toe thick callus uncomplicated, deformity of R great toe mild tender no fluctuance, and R base of great toe plantar with larger 2-3 cm unroofed callus with ulceration some deeper subcutaneous tissue present, appears dry, no active drainage, no extending erythema.  Reduced sensation to light touch.  Neurological:     Mental Status: He is alert and oriented to person, place, and time.  Psychiatric:        Behavior: Behavior normal.     Comments: Well groomed, good eye contact, normal speech and thoughts     Diabetic Foot Exam - Simple   Simple Foot Form Diabetic Foot exam was performed with the following findings:  Yes 04/13/2019  3:14 PM  Visual Inspection See comments:  Yes Sensation Testing See comments:  Yes Pulse Check Posterior Tibialis and Dorsalis pulse intact bilaterally:  Yes Comments Diffuse reduced sensation to monofilament with neuropathy bilateral  feet, with callus formation multiple locations, in particular R foot with larger thick callus R medial great toe, and base of R MTP with larger opened callus, with unroofed callus with ulceration deeper subcutaneous tissue present     Right Foot.      Recent Labs    04/30/18 1209 07/08/18 0946 01/14/19 1138  HGBA1C 7.7* 6.9* 6.9*     Results for orders placed or performed in visit on 01/14/19  POCT HgB A1C  Result Value Ref Range   Hemoglobin A1C 6.9 (A) 4.0 - 5.6 %      Assessment & Plan:   Problem List Items Addressed This Visit    Type 2 diabetes, controlled, with neuropathy ([place])   Relevant Medications   glucose blood (ONETOUCH VERIO) test strip    Other Visit Diagnoses    Diabetic ulcer of toe of right foot associated with type 2 diabetes mellitus, with fat layer exposed ([place])    -  Primary   Relevant Medications   mupirocin ointment (BACTROBAN) 2 %   Other Relevant Orders   Ambulatory referral to Podiatry      Clinically ulceration Stage II approx with some non healing area plantar aspect R foot MTP and unroofed non open callus/ulcer of side of R great toe - Complicated diabetes w/ neuropathy  Plan - Reviewed routine wound care - Rx topical Mupirocin ointment prophylaxis BID 7-10 days as needed - Emphasized primarily needs debridement of these callus, also concern with swollen painful knot on side of R great toe to request evaluation  Referral to [facility]) for evaluation and management of Right foot diabetic ulcer caused by injury to callus that has come off and has poor healing 3-4 weeks open ulceration with other callus formation, extensive diabetic neuropathy, requesting ulcer wound care debridement of callus, and treatment of area Right great toe with bony deformity   Return criteria given if not improving.  Orders Placed This Encounter  Procedures  . Ambulatory referral to Podiatry    Referral Priority:   Routine    Referral  Type:   Consultation    Referral Reason:   Specialty Services Required    Requested Specialty:   Podiatry    Number of Visits Requested:   1     Meds ordered this encounter  Medications  . mupirocin ointment (BACTROBAN) 2 %    Sig: Apply 1 application topically 2 (two) times daily. Up to 1-2 weeks as needed to prevent infection  Dispense:  22 g    Refill:  0  . glucose blood (ONETOUCH VERIO) test strip    Sig: Check blood sugar up to 3x daily.    Dispense:  300 each    Refill:  3    For use with One Touch Verio Meter    Follow up plan: Return in about 2 weeks (around 04/27/2019), or if symptoms worsen or fail to improve, for toe / foot ulcer.  [name], DO [facility] 04/13/2019, 1:51 PM

## 2019-04-15 ENCOUNTER — Encounter: Payer: Self-pay | Admitting: Podiatry

## 2019-04-15 ENCOUNTER — Ambulatory Visit (INDEPENDENT_AMBULATORY_CARE_PROVIDER_SITE_OTHER): Payer: 59 | Admitting: Podiatry

## 2019-04-15 ENCOUNTER — Other Ambulatory Visit: Payer: Self-pay

## 2019-04-15 DIAGNOSIS — E1140 Type 2 diabetes mellitus with diabetic neuropathy, unspecified: Secondary | ICD-10-CM | POA: Diagnosis not present

## 2019-04-15 DIAGNOSIS — L97509 Non-pressure chronic ulcer of other part of unspecified foot with unspecified severity: Secondary | ICD-10-CM | POA: Diagnosis not present

## 2019-04-15 MED ORDER — SULFAMETHOXAZOLE-TRIMETHOPRIM 400-80 MG PO TABS: 1.0000 | ORAL_TABLET | Freq: Two times a day (BID) | ORAL | 0 refills | Status: DC

## 2019-04-15 NOTE — Progress Notes (Signed)
This patient presents the office for an evaluation of an open wound on the bottom of his right foot.  He says this area started as a callus but then developed into a blister which finally ruptured.  He says that this open wound has been present for approximately 3 weeks.  He was seen by his PCP 2 days ago.  The PCP recommended that he presents to the office for an evaluation and off weightbearing of this ulcer.  He also gave him Bactroban to apply to the wound in an effort to close the wound.  Patient has been diagnosed with diabetes with neuropathy.  Patient is also concerned about the callus on the inside border of the right great toe.  He presents to the office today for continued evaluation and treatment of this condition.  Vascular  Dorsalis pedis and posterior tibial pulses are palpable  B/L.  Capillary return  WNL.  Temperature gradient is  WNL.  Skin turgor  WNL  Sensorium  Senn Weinstein monofilament wire absent.   Absent  tactile sensation.  Nail Exam  Patient has normal nails with no evidence of bacterial or fungal infection.  Orthopedic  Exam  Muscle tone and muscle strength  WNL.  No limitations of motion feet  B/L.  No crepitus or joint effusion noted.  Foot type is unremarkable and digits show no abnormalities.   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  Cavus foot type  B.L.Pinch callus right hallux.  Skin    Normal skin texture and turgor.  Open wound under the 1st MPJ and under tibial sesamoid.  No redness or swelling noted.  Crust skin noted on the outer aspect open wound.  No drainage or infection noted.  Diabetic Ulcer.  Diabetic neuropathy.  ROV.  Discussed this condition with this patient.  Told him the callus/blister formed to a functional hallux limitus deformity first MPJ right foot.  Debrided the crusty dead tissue at the site of the ulcer/open wound.  Neosporin dry sterile dressing was applied.  Patient was told to perform home soaks and re-bandaged the open wounds.   Prescribed Septra for this patient to take since his big toe joint looks bigger redder and inflamed than the left big toe joint.  He is to return to the office in 10 days for further evaluation and treatment.  Padding was applied to the insole in his shoe.  This is to off weight-bear this area during gait.  RTC prn.   Gardiner Barefoot DPM

## 2019-04-19 ENCOUNTER — Telehealth: Payer: Self-pay

## 2019-04-19 DIAGNOSIS — E1140 Type 2 diabetes mellitus with diabetic neuropathy, unspecified: Secondary | ICD-10-CM

## 2019-04-19 NOTE — Telephone Encounter (Signed)
Patient called back and reports that he has a card for a free one touch verio flex and would like that send to [facility] because he can not test sugar. This will get him through till the other meter is approved.

## 2019-04-19 NOTE — Telephone Encounter (Signed)
Free style libra for meter and sensors sent to [facility]  It will prior auth [phone]

## 2019-04-20 MED ORDER — ONETOUCH VERIO FLEX SYSTEM W/DEVICE KIT: PACK | 0 refills | Status: DC

## 2019-04-20 MED ORDER — ONETOUCH VERIO W/DEVICE KIT: PACK | 0 refills | Status: DC

## 2019-04-20 MED ORDER — ONETOUCH ULTRASOFT LANCETS MISC: 2 refills | Status: DC

## 2019-04-20 MED ORDER — ONETOUCH VERIO VI STRP: ORAL_STRIP | 2 refills | Status: DC

## 2019-04-20 NOTE — Telephone Encounter (Signed)
Ordered One Touch Verio Flex and supplies  [name], DO [facility] 04/20/2019, 2:21 PM

## 2019-04-29 ENCOUNTER — Ambulatory Visit: Payer: 59 | Admitting: Podiatry

## 2019-05-03 ENCOUNTER — Ambulatory Visit (INDEPENDENT_AMBULATORY_CARE_PROVIDER_SITE_OTHER): Payer: 59 | Admitting: Podiatry

## 2019-05-03 ENCOUNTER — Encounter: Payer: Self-pay | Admitting: Podiatry

## 2019-05-03 ENCOUNTER — Other Ambulatory Visit: Payer: Self-pay

## 2019-05-03 DIAGNOSIS — L97509 Non-pressure chronic ulcer of other part of unspecified foot with unspecified severity: Secondary | ICD-10-CM | POA: Insufficient documentation

## 2019-05-03 DIAGNOSIS — E1140 Type 2 diabetes mellitus with diabetic neuropathy, unspecified: Secondary | ICD-10-CM | POA: Diagnosis not present

## 2019-05-03 DIAGNOSIS — L97511 Non-pressure chronic ulcer of other part of right foot limited to breakdown of skin: Secondary | ICD-10-CM | POA: Diagnosis not present

## 2019-05-03 NOTE — Progress Notes (Signed)
This patient presents to the office with multiple foot problems.  He was seen in the office approximately 3 weeks ago for an open wound that had developed under the big toe joint right foot.  He says that the open wound was a callus  that burst open due to a blister.  He was referred to this office by his PCP to off weight-bear the site. He was also taking septra for previous infection.   He says that he has not seen much improvement on the ulcer under the big toe joint right foot.  The pinch callus that was present in his right big toe has healed with hemorrhagic callus.  He says he also dropped a storm door on the outside of his toenail right hallux and a blood blister formed.  Finally he has developed a skin lesion at the proximal nail fold medial border left hallux.  He states he is not having any drainage from this site.  He presents the office today for an evaluation and treatment of his feet.  This patient has been diagnosed as having diabetic neuropathy.  Vascular  Dorsalis pedis and posterior tibial pulses are palpable  B/L.  Capillary return  WNL.  Temperature gradient is  WNL.  Skin turgor  WNL  Sensorium  Senn Weinstein monofilament wire absent . Absent  tactile sensation.  Nail Exam  Patient has normal nails with no evidence of bacterial or fungal infection.  Lateral aspect of nail plate is attached at proximal nail fold.  Orthopedic  Exam  Muscle tone and muscle strength  WNL.  No limitations of motion feet  B/L.  No crepitus or joint effusion noted.  Foot type is unremarkable and digits show no abnormalities.  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  Small healing open wound left hallux.  Skin  No open lesions.  Normal skin texture and turgor.  Hemorrhagic pinch callus right hallux.   There is a plantar medial ulcer noted under the first MPJ right foot.  No evidence of any redness swelling or drainage.  The lateral most aspect of the ulcer has subcutaneous tissue present.  The medial  most aspect of the ulcer has crusty hyperkeratotic tissue noted.  No evidence of infection noted at ulcer site. Small open wound left hallux.  Diabetic ulcer right foot.  Subungual trauma right hallux.  Pinch callus right foot.    ROV.  The debride pinch callus revealing healing  skin.  Examination of the blister/ulcer under the first MPJ revealed minimal healing from my examination 3 weeks ago.  The infection at the site of the ulcer has resolved.  This patient may need advanced care such as total contact cast and/or Unna boot and or cam walker.  Therefore I decided to refer him to Dr. Amalia Hailey for him to evaluate and treat this patient.  He is scheduled for an appointment with Dr. Amalia Hailey in 2 days.   Gardiner Barefoot DPM

## 2019-05-05 ENCOUNTER — Encounter: Payer: Self-pay | Admitting: Podiatry

## 2019-05-05 ENCOUNTER — Ambulatory Visit (INDEPENDENT_AMBULATORY_CARE_PROVIDER_SITE_OTHER): Payer: 59 | Admitting: Podiatry

## 2019-05-05 ENCOUNTER — Other Ambulatory Visit: Payer: Self-pay

## 2019-05-05 VITALS — Temp 97.6°F

## 2019-05-05 DIAGNOSIS — L97512 Non-pressure chronic ulcer of other part of right foot with fat layer exposed: Secondary | ICD-10-CM

## 2019-05-05 DIAGNOSIS — E0843 Diabetes mellitus due to underlying condition with diabetic autonomic (poly)neuropathy: Secondary | ICD-10-CM | POA: Diagnosis not present

## 2019-05-05 MED ORDER — GENTAMICIN SULFATE 0.1 % EX CREA: 1.0000 "application " | TOPICAL_CREAM | Freq: Two times a day (BID) | CUTANEOUS | 1 refills | Status: DC

## 2019-05-12 NOTE — Progress Notes (Signed)
   Subjective:  58 y.o. male with PMHx of diabetes mellitus presenting today for follow up evaluation of an ulceration of the right foot. He expresses concern that the wound is not healing appropriately. He denies any change in the appearance of the wound in the last six weeks. There are no modifying factors noted. Patient is here for further evaluation and treatment.   Past Medical History:  Diagnosis Date  . ADHD (attention deficit hyperactivity disorder)   . Anxiety   . Coronary artery disease   . Coronary artery disease   . Coronary atherosclerosis   . Depression   . Diabetes mellitus without complication ([place])   . GERD (gastroesophageal reflux disease)   . Glaucoma   . Headache    migrane  . Heart disease   . Hyperlipidemia   . Hypertension   . Hypertriglyceridemia   . Hypogonadism in male   . MVP (mitral valve prolapse)   . Narcolepsy and cataplexy   . Non-alcoholic fatty liver disease       Objective/Physical Exam General: The patient is alert and oriented x3 in no acute distress.  Dermatology:  Wound #1 noted to the right foot measuring 2.5 x 0.8 x 0.2 cm (LxWxD).   To the noted ulceration(s), there is no eschar. There is a moderate amount of slough, fibrin, and necrotic tissue noted. Granulation tissue and wound base is red. There is a minimal amount of serosanguineous drainage noted. There is no exposed bone muscle-tendon ligament or joint. There is no malodor. Periwound integrity is intact. Skin is warm, dry and supple bilateral lower extremities.  Vascular: Palpable pedal pulses bilaterally. No edema or erythema noted. Capillary refill within normal limits.  Neurological: Epicritic and protective threshold diminished bilaterally.   Musculoskeletal Exam: Range of motion within normal limits to all pedal and ankle joints bilateral. Muscle strength 5/5 in all groups bilateral.   Assessment: 1. Ulceration of the right foot secondary to diabetes mellitus 2.  diabetes mellitus w/ peripheral neuropathy   Plan of Care:  1. Patient was evaluated. 2. medically necessary excisional debridement including subcutaneous tissue was performed using a tissue nipper and a chisel blade. Excisional debridement of all the necrotic nonviable tissue down to healthy bleeding viable tissue was performed with post-debridement measurements same as pre-. 3. the wound was cleansed and dry sterile dressing applied. 4. Prescription for Gentamicin cream provided to patient to use daily with a bandage.  5. Post op shoe dispensed.  6. Offloading dancer's pads dispensed.  7. patient is to return to clinic in 3 weeks.   [name] Kins, DPM [facility]  Dr. [name] Kins, [place]                                        [place], [place] [address]                Office [phone]  Fax [phone]

## 2019-05-13 DIAGNOSIS — E1140 Type 2 diabetes mellitus with diabetic neuropathy, unspecified: Secondary | ICD-10-CM

## 2019-05-14 ENCOUNTER — Telehealth: Payer: Self-pay

## 2019-05-14 DIAGNOSIS — E1140 Type 2 diabetes mellitus with diabetic neuropathy, unspecified: Secondary | ICD-10-CM

## 2019-05-14 MED ORDER — FREESTYLE LIBRE 14 DAY SENSOR MISC: 1.0000 | 3 refills | Status: DC

## 2019-05-14 MED ORDER — OZEMPIC (0.25 OR 0.5 MG/DOSE) 2 MG/1.5ML ~~LOC~~ SOPN: 0.2500 mg | PEN_INJECTOR | SUBCUTANEOUS | 2 refills | Status: DC

## 2019-05-14 MED ORDER — OZEMPIC (0.25 OR 0.5 MG/DOSE) 2 MG/1.5ML ~~LOC~~ SOPN: 0.5000 mg | PEN_INJECTOR | SUBCUTANEOUS | 1 refills | Status: DC

## 2019-05-14 MED ORDER — FREESTYLE LIBRE 14 DAY READER DEVI: 1.0000 | Freq: Every day | 0 refills | Status: DC

## 2019-05-14 NOTE — Telephone Encounter (Signed)
Sent rx to pharmacy.  In addition, - patient sent a detailed mychart message asking for [place] to get [place] authorized w/ his insurance.  I have found the original one and I took a reading out of each of the 2 m and there was at least 50 points difference between the two readings taken at the same time. I have talked to my insurance company already about upgrading to the [employer] And they said they would cover it with no problem as long as I had a prescription and the doctors office needs to call my insurance company to get a authorization for the freestyle. Can you please take care of this for me so that I can try to get something that I feel is a bit more accurate especially since I'm going to this Ozempic pen  Is copy of patient mychart message.  Can you please contact his insurance to provide authorization or request a PA for the freestyle libre? I am not sure what requirements are needed to get it approved.  Dale [name], [facility] 05/14/2019, 5:30 PM

## 2019-05-14 NOTE — Addendum Note (Signed)
Addended by: Olin Hauser on: 05/14/2019 12:15 PM   Modules accepted: Orders

## 2019-05-14 NOTE — Telephone Encounter (Signed)
The pt requesting that we send a prescription for a freestyle libre glucometer.

## 2019-05-14 NOTE — Addendum Note (Signed)
Addended by: Olin Hauser on: 05/14/2019 12:31 PM   Modules accepted: Orders

## 2019-05-28 ENCOUNTER — Ambulatory Visit: Payer: 59 | Admitting: Podiatry

## 2019-05-28 ENCOUNTER — Encounter: Payer: Self-pay | Admitting: Podiatry

## 2019-05-28 ENCOUNTER — Other Ambulatory Visit: Payer: Self-pay

## 2019-05-28 VITALS — Temp 98.1°F

## 2019-05-28 DIAGNOSIS — E0843 Diabetes mellitus due to underlying condition with diabetic autonomic (poly)neuropathy: Secondary | ICD-10-CM | POA: Diagnosis not present

## 2019-05-28 DIAGNOSIS — L97512 Non-pressure chronic ulcer of other part of right foot with fat layer exposed: Secondary | ICD-10-CM

## 2019-05-31 ENCOUNTER — Telehealth: Payer: Self-pay | Admitting: Podiatry

## 2019-05-31 ENCOUNTER — Telehealth: Payer: Self-pay | Admitting: *Deleted

## 2019-05-31 NOTE — Telephone Encounter (Signed)
Pt wanted to get a message to Dr. Amalia Hailey that he cancelled his appt for diabetic shoe measurements due to his insurance not covering the shoes and is unable to afford the cost of the shoes.

## 2019-05-31 NOTE — Telephone Encounter (Signed)
Dr. Amalia Hailey, this is FYI

## 2019-05-31 NOTE — Telephone Encounter (Signed)
Pt states he is scheduled Wednesday 06/02/2019 with podiatrist for orthotics and his insurance does not cover, he will have to cancel his appt.

## 2019-06-02 ENCOUNTER — Other Ambulatory Visit: Payer: 59 | Admitting: Orthotics

## 2019-06-04 NOTE — Progress Notes (Signed)
   Subjective:  58 y.o. male with PMHx of diabetes mellitus presenting today for follow up evaluation of an ulceration of the right foot. He states the wound looks to be improving but reports one area that is not healing like the rest of the area. He has been using Gentamicin cream as directed. There are no worsening factors noted. Patient is here for further evaluation and treatment.   Past Medical History:  Diagnosis Date  . ADHD (attention deficit hyperactivity disorder)   . Anxiety   . Coronary artery disease   . Coronary artery disease   . Coronary atherosclerosis   . Depression   . Diabetes mellitus without complication ([place])   . GERD (gastroesophageal reflux disease)   . Glaucoma   . Headache    migrane  . Heart disease   . Hyperlipidemia   . Hypertension   . Hypertriglyceridemia   . Hypogonadism in male   . MVP (mitral valve prolapse)   . Narcolepsy and cataplexy   . Non-alcoholic fatty liver disease       Objective/Physical Exam General: The patient is alert and oriented x3 in no acute distress.  Dermatology:  Wound #1 noted to the right foot measuring 1.5 x 0.4 x 0.3 cm (LxWxD).   To the noted ulceration(s), there is no eschar. There is a moderate amount of slough, fibrin, and necrotic tissue noted. Granulation tissue and wound base is red. There is a minimal amount of serosanguineous drainage noted. There is no exposed bone muscle-tendon ligament or joint. There is no malodor. Periwound integrity is intact. Skin is warm, dry and supple bilateral lower extremities.  Vascular: Palpable pedal pulses bilaterally. No edema or erythema noted. Capillary refill within normal limits.  Neurological: Epicritic and protective threshold diminished bilaterally.   Musculoskeletal Exam: Range of motion within normal limits to all pedal and ankle joints bilateral. Muscle strength 5/5 in all groups bilateral.   Assessment: 1. Ulceration of the right foot secondary to diabetes  mellitus 2. diabetes mellitus w/ peripheral neuropathy   Plan of Care:  1. Patient was evaluated. 2. medically necessary excisional debridement including subcutaneous tissue was performed using a tissue nipper and a chisel blade. Excisional debridement of all the necrotic nonviable tissue down to healthy bleeding viable tissue was performed with post-debridement measurements same as pre-. 3. the wound was cleansed and dry sterile dressing applied. 4. Continue using Gentamicin cream daily.  5. Continue using post op shoe.  6. Appointment with Liliane Channel, Pedorthist, for DM shoes. 7. Return to clinic in 3 weeks.    [name] Kins, DPM [facility]  Dr. [name] Kins, [place]                                        [place], [place] [address]                Office [phone]  Fax [phone]

## 2019-06-09 ENCOUNTER — Ambulatory Visit: Payer: Self-pay | Admitting: Urology

## 2019-06-22 ENCOUNTER — Encounter: Payer: Self-pay | Admitting: Podiatry

## 2019-06-22 ENCOUNTER — Ambulatory Visit (INDEPENDENT_AMBULATORY_CARE_PROVIDER_SITE_OTHER): Payer: 59

## 2019-06-22 ENCOUNTER — Ambulatory Visit: Payer: 59 | Admitting: Podiatry

## 2019-06-22 ENCOUNTER — Other Ambulatory Visit: Payer: Self-pay

## 2019-06-22 VITALS — Temp 98.2°F

## 2019-06-22 DIAGNOSIS — L97512 Non-pressure chronic ulcer of other part of right foot with fat layer exposed: Secondary | ICD-10-CM

## 2019-06-22 DIAGNOSIS — E1142 Type 2 diabetes mellitus with diabetic polyneuropathy: Secondary | ICD-10-CM

## 2019-06-22 DIAGNOSIS — E0843 Diabetes mellitus due to underlying condition with diabetic autonomic (poly)neuropathy: Secondary | ICD-10-CM

## 2019-06-22 MED ORDER — GENTAMICIN SULFATE 0.1 % EX CREA: 1.0000 "application " | TOPICAL_CREAM | Freq: Two times a day (BID) | CUTANEOUS | 1 refills | Status: DC

## 2019-06-24 NOTE — Progress Notes (Signed)
   Subjective:  58 y.o. male with PMHx of diabetes mellitus presenting today for follow up evaluation of an ulceration of the right foot. He states he is doing well. He denies any problems concerning the wound. He has been using the Gentamicin cream as needed. Patient is here for further evaluation and treatment.   Past Medical History:  Diagnosis Date  . ADHD (attention deficit hyperactivity disorder)   . Anxiety   . Coronary artery disease   . Coronary artery disease   . Coronary atherosclerosis   . Depression   . Diabetes mellitus without complication ([place])   . GERD (gastroesophageal reflux disease)   . Glaucoma   . Headache    migrane  . Heart disease   . Hyperlipidemia   . Hypertension   . Hypertriglyceridemia   . Hypogonadism in male   . MVP (mitral valve prolapse)   . Narcolepsy and cataplexy   . Non-alcoholic fatty liver disease       Objective/Physical Exam General: The patient is alert and oriented x3 in no acute distress.  Dermatology:  Wound #1 noted to the right foot measuring 0.5 x 0.3 x 0.1 cm (LxWxD).   To the noted ulceration(s), there is no eschar. There is a moderate amount of slough, fibrin, and necrotic tissue noted. Granulation tissue and wound base is red. There is a minimal amount of serosanguineous drainage noted. There is no exposed bone muscle-tendon ligament or joint. There is no malodor. Periwound integrity is intact. Skin is warm, dry and supple bilateral lower extremities.  Vascular: Palpable pedal pulses bilaterally. No edema or erythema noted. Capillary refill within normal limits.  Neurological: Epicritic and protective threshold diminished bilaterally.   Musculoskeletal Exam: Range of motion within normal limits to all pedal and ankle joints bilateral. Muscle strength 5/5 in all groups bilateral.   Assessment: 1. Ulceration of the right foot secondary to diabetes mellitus 2. diabetes mellitus w/ peripheral neuropathy   Plan of Care:   1. Patient was evaluated. X-Rays reviewed.  2. medically necessary excisional debridement including subcutaneous tissue was performed using a tissue nipper and a chisel blade. Excisional debridement of all the necrotic nonviable tissue down to healthy bleeding viable tissue was performed with post-debridement measurements same as pre-. 3. the wound was cleansed and dry sterile dressing applied. 4. Refill prescription for Gentamicin cream provided to patient.  5. Continue wearing [employer] shoes. Insurance will not cover DM shoes.  6. Return to clinic in 6 weeks.    [name] Kins, DPM [facility]  Dr. [name] Kins, [address]                                        [place], [place] [address]                Office [phone]  Fax [phone]

## 2019-06-28 ENCOUNTER — Ambulatory Visit (INDEPENDENT_AMBULATORY_CARE_PROVIDER_SITE_OTHER): Payer: 59 | Admitting: Urology

## 2019-06-28 ENCOUNTER — Other Ambulatory Visit: Payer: Self-pay

## 2019-06-28 ENCOUNTER — Encounter: Payer: Self-pay | Admitting: Urology

## 2019-06-28 VITALS — BP 135/85 | HR 92 | Ht 70.0 in | Wt 232.0 lb

## 2019-06-28 DIAGNOSIS — N5201 Erectile dysfunction due to arterial insufficiency: Secondary | ICD-10-CM

## 2019-06-28 DIAGNOSIS — E291 Testicular hypofunction: Secondary | ICD-10-CM | POA: Diagnosis not present

## 2019-06-28 NOTE — Progress Notes (Signed)
06/28/2019 10:23 AM   Dale [name]. 1961-01-30 [id number]  Referring provider: Olin Hauser, DO [address] [place],  [place] [address]  Chief Complaint  Patient presents with  . Hypogonadism    Urologic history: 1.  Hypogonadism  -Testosterone cypionate every 2 weeks  2.  Erectile dysfunction   HPI: 58 y.o. male presents for follow-up of hypogonadism. His primary symptoms were severe tiredness and fatigue.  He had persistent erectile dysfunction.  He is injecting 200 mg every 2 weeks and last injected yesterday.  He does have ED and at his last visit was given a trial of tadalafil which was not effective.  He has no bothersome lower urinary tract symptoms.  IPSS completed today was 1/2.  Denies lower extremity edema or breast tenderness/enlargement.   PMH: Past Medical History:  Diagnosis Date  . ADHD (attention deficit hyperactivity disorder)   . Anxiety   . Coronary artery disease   . Coronary artery disease   . Coronary atherosclerosis   . Depression   . Diabetes mellitus without complication ([place])   . GERD (gastroesophageal reflux disease)   . Glaucoma   . Headache    migrane  . Heart disease   . Hyperlipidemia   . Hypertension   . Hypertriglyceridemia   . Hypogonadism in male   . MVP (mitral valve prolapse)   . Narcolepsy and cataplexy   . Non-alcoholic fatty liver disease     Surgical History: Past Surgical History:  Procedure Laterality Date  . COLONOSCOPY WITH PROPOFOL N/A 11/18/2018   Procedure: COLONOSCOPY WITH PROPOFOL;  Surgeon: Manya Silvas, MD;  Location: [facility] ENDOSCOPY;  Service: Endoscopy;  Laterality: N/A;  . ESOPHAGOGASTRODUODENOSCOPY (EGD) WITH PROPOFOL N/A 11/18/2018   Procedure: ESOPHAGOGASTRODUODENOSCOPY (EGD) WITH PROPOFOL;  Surgeon: Manya Silvas, MD;  Location: [facility] ENDOSCOPY;  Service: Endoscopy;  Laterality: N/A;  . pain pump implant and removal Bilateral    Dec. 21 2019  . SPINE SURGERY     Multiple  surgeries, initial 2001, thoracic disc repair and recurrent revisions.  . THROAT SURGERY      Home Medications:  Allergies as of 06/28/2019      Reactions   Benzoin Other (See Comments)   blisters   Butrans [buprenorphine]    Severe Burn pain patch, blisters   Doxycycline Hyclate Nausea Only   Morphine Other (See Comments)   Penicillins Other (See Comments)   Tape Rash      Medication List       Accurate as of June 28, 2019 10:23 AM. If you have any questions, ask your nurse or doctor.        acetaminophen 500 MG tablet Commonly known as: TYLENOL Take by mouth.   ALPRAZolam 1 MG tablet Commonly known as: XANAX Take 1 mg by mouth 3 (three) times daily.   amLODipine 5 MG tablet Commonly known as: NORVASC Take 5 mg by mouth daily.   amphetamine-dextroamphetamine 10 MG 24 hr capsule Commonly known as: ADDERALL XR Take 10 mg by mouth daily.   aspirin EC 81 MG tablet Take 81 mg by mouth daily.   atorvastatin 10 MG tablet Commonly known as: LIPITOR TAKE 1 TABLET BY MOUTH  DAILY AT 2 PM.   b complex vitamins capsule Take by mouth.   butalbital-acetaminophen-caffeine 50-325-40 MG tablet Commonly known as: FIORICET Take 1 tablet by mouth daily as needed for headache.   cholecalciferol 10 MCG (400 UNIT) Tabs tablet Commonly known as: VITAMIN D3 Take 400 Units  by mouth.   co-enzyme Q-10 30 MG capsule Take 30 mg by mouth 3 (three) times daily.   desoximetasone 0.25 % cream Commonly known as: TOPICORT APPLY 1 APPLICATION TOPICALLY 2 (TWO) TIMES DAILY AS NEEDED.   diclofenac sodium 1 % Gel Commonly known as: VOLTAREN Apply topically.   dicyclomine 20 MG tablet Commonly known as: BENTYL TAKE 1 TABLET (20 MG TOTAL) BY MOUTH EVERY 6 (SIX) HOURS   DULoxetine 60 MG capsule Commonly known as: CYMBALTA Take 1 capsule (60 mg total) by mouth daily.   DULoxetine 30 MG capsule Commonly known as: CYMBALTA Take 1 capsule (30 mg total) by mouth daily. Take with  existing '60mg'$  dose = total daily dose '90mg'$    Fish Oil 1200 MG Caps Take 1 capsule (1,200 mg total) by mouth daily.   fluticasone 50 MCG/ACT nasal spray Commonly known as: FLONASE Place 2 sprays into both nostrils daily.   FreeStyle Libre 14 Day Reader Kerrin Mo 1 Device by Does not apply route daily.   FreeStyle Libre 14 Day Sensor Misc 1 each by Does not apply route every 14 (fourteen) days.   gabapentin 300 MG capsule Commonly known as: NEURONTIN Take 1 capsule (300 mg total) by mouth 3 (three) times daily.   gentamicin cream 0.1 % Commonly known as: GARAMYCIN Apply 1 application topically 2 (two) times daily.   hydrochlorothiazide 25 MG tablet Commonly known as: HYDRODIURIL TAKE 1 TABLET BY MOUTH EVERY DAY   hydrOXYzine 25 MG capsule Commonly known as: VISTARIL 25 mg.   lisinopril 5 MG tablet Commonly known as: ZESTRIL Take 1 tablet (5 mg total) by mouth daily.   meloxicam 7.5 MG tablet Commonly known as: MOBIC   metFORMIN 1000 MG tablet Commonly known as: GLUCOPHAGE Take 1 tablet (1,000 mg total) by mouth 2 (two) times daily with a meal.   multivitamin tablet Take 1 tablet by mouth daily.   mupirocin ointment 2 % Commonly known as: Bactroban Apply 1 application topically 2 (two) times daily. Up to 1-2 weeks as needed to prevent infection   NEEDLE (DISP) 18 G 18G X 1" Misc Commonly known as: B-D DISP NEEDLE TW 18GX1" For use to draw up medication into the syringe   NEEDLE (DISP) 21 G 21G X 1-1/2" Misc Commonly known as: BD SafetyGlide Shielded Needle For use of administering medication in the IM location   onetouch ultrasoft lancets Check blood sugar up to 2 times daily   OneTouch Verio Flex System w/Device Kit Check blood sugar up to 2 times daily   OneTouch Verio test strip Generic drug: glucose blood Check blood sugar up to 2 times daily   Ozempic (0.25 or 0.5 MG/DOSE) 2 MG/1.5ML Sopn Generic drug: Semaglutide(0.25 or 0.'5MG'$ /DOS) Inject 0.5 mg into  the skin once a week. First 4 weeks can be dosed at 0.'25mg'$  weekly. Then increase.   pantoprazole 40 MG tablet Commonly known as: PROTONIX Take 40 mg by mouth 2 (two) times daily.   sulfamethoxazole-trimethoprim 400-80 MG tablet Commonly known as: Bactrim Take 1 tablet by mouth 2 (two) times daily.   Syringe (Disposable) 3 ML Misc Use syringes as directed by physician   testosterone cypionate 200 MG/ML injection Commonly known as: DEPOTESTOSTERONE CYPIONATE Inject 1 mL (200 mg total) into the muscle every 14 (fourteen) days.   tiZANidine 2 MG tablet Commonly known as: ZANAFLEX   traMADol 50 MG tablet Commonly known as: ULTRAM Take by mouth every 6 (six) hours as needed.   Wellbutrin XL 300 MG 24 hr  tablet Generic drug: buPROPion Take 1 tablet (300 mg total) by mouth daily.   zinc gluconate 50 MG tablet Take by mouth.       Allergies:  Allergies  Allergen Reactions  . Benzoin Other (See Comments)    blisters  . Butrans [Buprenorphine]     Severe Burn pain patch, blisters  . Doxycycline Hyclate Nausea Only  . Morphine Other (See Comments)  . Penicillins Other (See Comments)  . Tape Rash    Family History: Family History  Problem Relation Age of Onset  . Depression Mother   . Anxiety disorder Mother   . Diabetes Mother   . Heart failure Mother   . Heart attack Father   . Diabetes Sister   . Obesity Sister   . Drug abuse Brother   . Alcohol abuse Brother   . Depression Sister   . Anxiety disorder Sister   . Drug abuse Sister   . Anxiety disorder Sister   . Seizures Sister   . Diabetes Sister   . Depression Sister     Social History:  reports that he quit smoking about 8 years ago. His smoking use included cigarettes. He started smoking about 39 years ago. He quit smokeless tobacco use about 8 years ago. He reports that he does not drink alcohol or use drugs.  ROS: UROLOGY Frequent Urination?: No Hard to postpone urination?: No Burning/pain with  urination?: No Get up at night to urinate?: No Leakage of urine?: No Urine stream starts and stops?: No Trouble starting stream?: No Do you have to strain to urinate?: No Blood in urine?: No Urinary tract infection?: No Sexually transmitted disease?: No Injury to kidneys or bladder?: No Painful intercourse?: No Weak stream?: No Erection problems?: Yes Penile pain?: No  Gastrointestinal Nausea?: No Vomiting?: No Indigestion/heartburn?: No Diarrhea?: No Constipation?: No  Constitutional Fever: No Night sweats?: Yes Weight loss?: No Fatigue?: No  Skin Skin rash/lesions?: No Itching?: No  Eyes Blurred vision?: No Double vision?: No  Ears/Nose/Throat Sore throat?: No Sinus problems?: No  Hematologic/Lymphatic Swollen glands?: No Easy bruising?: No  Cardiovascular Leg swelling?: No Chest pain?: No  Respiratory Cough?: No Shortness of breath?: No  Endocrine Excessive thirst?: No  Musculoskeletal Back pain?: Yes Joint pain?: No  Neurological Headaches?: No Dizziness?: No  Psychologic Depression?: Yes Anxiety?: Yes  Physical Exam: BP 135/85   Pulse 92   Ht '5\' 10"'$  (1.778 m)   Wt 232 lb (105.2 kg)   BMI 33.29 kg/m   Constitutional:  Alert and oriented, No acute distress. HEENT: [place] AT, moist mucus membranes.  Trachea midline, no masses. Cardiovascular: No clubbing, cyanosis, or edema. Respiratory: Normal respiratory effort, no increased work of breathing. GI: Abdomen is soft, nontender, nondistended, no abdominal masses GU: No CVA tenderness.  Prostate 30 g, smooth without nodules Skin: No rashes, bruises or suspicious lesions. Neurologic: Grossly intact, no focal deficits, moving all 4 extremities. Psychiatric: Normal mood and affect.   Assessment & Plan:    - Hypogonadism Tiredness and fatigue significantly improved on TRT which he desires to continue.  Testosterone, PSA hematocrit were ordered today and he will be notified with results.   If stable recommend a lab visit in 6 months for testosterone/hematocrit and office visit 1 year for labs and DRE.  - Erectile dysfunction PDE 5 inhibitor therapy has not been effective.  I discussed intracavernosal injections and vacuum erection devices.  He was provided literature and website information and if interested will call back.  Abbie Sons, [place] [address] [place], [place] [address] [phone]

## 2019-06-29 ENCOUNTER — Encounter: Payer: Self-pay | Admitting: Urology

## 2019-06-29 DIAGNOSIS — N5201 Erectile dysfunction due to arterial insufficiency: Secondary | ICD-10-CM | POA: Insufficient documentation

## 2019-06-29 DIAGNOSIS — E291 Testicular hypofunction: Secondary | ICD-10-CM | POA: Insufficient documentation

## 2019-06-29 LAB — PSA: Prostate Specific Ag, Serum: 0.5 ng/mL (ref 0.0–4.0)

## 2019-06-29 LAB — HEMATOCRIT: Hematocrit: 47.8 % (ref 37.5–51.0)

## 2019-06-29 LAB — TESTOSTERONE: Testosterone: 338 ng/dL (ref 264–916)

## 2019-07-05 ENCOUNTER — Other Ambulatory Visit: Payer: Self-pay | Admitting: Family Medicine

## 2019-07-05 DIAGNOSIS — F331 Major depressive disorder, recurrent, moderate: Secondary | ICD-10-CM

## 2019-07-05 DIAGNOSIS — F5101 Primary insomnia: Secondary | ICD-10-CM

## 2019-07-06 ENCOUNTER — Other Ambulatory Visit: Payer: Self-pay | Admitting: Family Medicine

## 2019-07-06 MED ORDER — "BD DISP NEEDLE TW 18G X 1"" MISC": 0 refills | Status: DC

## 2019-07-06 MED ORDER — "BD SAFETYGLIDE SHIELDED NEEDLE 21G X 1-1/2"" MISC": 0 refills | Status: DC

## 2019-07-06 MED ORDER — SYRINGE (DISPOSABLE) 3 ML MISC: 0 refills | Status: DC

## 2019-07-06 MED ORDER — TESTOSTERONE CYPIONATE 200 MG/ML IM SOLN: 200.0000 mg | INTRAMUSCULAR | 0 refills | Status: DC

## 2019-07-13 ENCOUNTER — Other Ambulatory Visit: Payer: Self-pay

## 2019-07-13 ENCOUNTER — Other Ambulatory Visit: Payer: 59

## 2019-07-13 DIAGNOSIS — E1140 Type 2 diabetes mellitus with diabetic neuropathy, unspecified: Secondary | ICD-10-CM

## 2019-07-13 DIAGNOSIS — Z0000 Encounter for general adult medical examination without abnormal findings: Secondary | ICD-10-CM

## 2019-07-13 DIAGNOSIS — I10 Essential (primary) hypertension: Secondary | ICD-10-CM

## 2019-07-13 DIAGNOSIS — Z125 Encounter for screening for malignant neoplasm of prostate: Secondary | ICD-10-CM

## 2019-07-13 DIAGNOSIS — E538 Deficiency of other specified B group vitamins: Secondary | ICD-10-CM

## 2019-07-13 DIAGNOSIS — K760 Fatty (change of) liver, not elsewhere classified: Secondary | ICD-10-CM

## 2019-07-13 DIAGNOSIS — E1169 Type 2 diabetes mellitus with other specified complication: Secondary | ICD-10-CM

## 2019-07-13 DIAGNOSIS — E785 Hyperlipidemia, unspecified: Secondary | ICD-10-CM

## 2019-07-14 LAB — CBC WITH DIFFERENTIAL/PLATELET
Absolute Monocytes: 956 cells/uL — ABNORMAL HIGH (ref 200–950)
Basophils Absolute: 63 cells/uL (ref 0–200)
Basophils Relative: 0.6 %
Eosinophils Absolute: 410 cells/uL (ref 15–500)
Eosinophils Relative: 3.9 %
HCT: 45 % (ref 38.5–50.0)
Hemoglobin: 14.2 g/dL (ref 13.2–17.1)
Lymphs Abs: 2867 cells/uL (ref 850–3900)
MCH: 25.8 pg — ABNORMAL LOW (ref 27.0–33.0)
MCHC: 31.6 g/dL — ABNORMAL LOW (ref 32.0–36.0)
MCV: 81.7 fL (ref 80.0–100.0)
MPV: 10.8 fL (ref 7.5–12.5)
Monocytes Relative: 9.1 %
Neutro Abs: 6206 cells/uL (ref 1500–7800)
Neutrophils Relative %: 59.1 %
Platelets: 291 10*3/uL (ref 140–400)
RBC: 5.51 10*6/uL (ref 4.20–5.80)
RDW: 19.3 % — ABNORMAL HIGH (ref 11.0–15.0)
Total Lymphocyte: 27.3 %
WBC: 10.5 10*3/uL (ref 3.8–10.8)

## 2019-07-14 LAB — COMPLETE METABOLIC PANEL WITH GFR
AG Ratio: 1.7 (calc) (ref 1.0–2.5)
ALT: 20 U/L (ref 9–46)
AST: 21 U/L (ref 10–35)
Albumin: 4.6 g/dL (ref 3.6–5.1)
Alkaline phosphatase (APISO): 80 U/L (ref 35–144)
BUN: 24 mg/dL (ref 7–25)
CO2: 31 mmol/L (ref 20–32)
Calcium: 10.6 mg/dL — ABNORMAL HIGH (ref 8.6–10.3)
Chloride: 95 mmol/L — ABNORMAL LOW (ref 98–110)
Creat: 1.07 mg/dL (ref 0.70–1.33)
GFR, Est African American: 88 mL/min/{1.73_m2} (ref >=60)
GFR, Est Non African American: 76 mL/min/{1.73_m2} (ref >=60)
Globulin: 2.7 g/dL (calc) (ref 1.9–3.7)
Glucose, Bld: 98 mg/dL (ref 65–99)
Potassium: 4.6 mmol/L (ref 3.5–5.3)
Sodium: 138 mmol/L (ref 135–146)
Total Bilirubin: 0.5 mg/dL (ref 0.2–1.2)
Total Protein: 7.3 g/dL (ref 6.1–8.1)

## 2019-07-14 LAB — HEMOGLOBIN A1C
Hgb A1c MFr Bld: 6.2 % of total Hgb — ABNORMAL HIGH (ref <5.7)
Mean Plasma Glucose: 131 (calc)
eAG (mmol/L): 7.3 (calc)

## 2019-07-14 LAB — LIPID PANEL
Cholesterol: 113 mg/dL (ref <200)
HDL: 32 mg/dL — ABNORMAL LOW (ref >=40)
LDL Cholesterol (Calc): 55 mg/dL (calc)
Non-HDL Cholesterol (Calc): 81 mg/dL (calc) (ref <130)
Total CHOL/HDL Ratio: 3.5 (calc) (ref <5.0)
Triglycerides: 184 mg/dL — ABNORMAL HIGH (ref <150)

## 2019-07-14 LAB — HM DIABETES EYE EXAM

## 2019-07-14 LAB — VITAMIN B12: Vitamin B-12: 547 pg/mL (ref 200–1100)

## 2019-07-14 LAB — PSA: PSA: 0.5 ng/mL (ref <=4.0)

## 2019-07-16 ENCOUNTER — Other Ambulatory Visit: Payer: Self-pay | Admitting: Family Medicine

## 2019-07-16 DIAGNOSIS — E785 Hyperlipidemia, unspecified: Secondary | ICD-10-CM

## 2019-07-16 DIAGNOSIS — E1169 Type 2 diabetes mellitus with other specified complication: Secondary | ICD-10-CM

## 2019-07-20 ENCOUNTER — Ambulatory Visit (INDEPENDENT_AMBULATORY_CARE_PROVIDER_SITE_OTHER): Payer: 59 | Admitting: Family Medicine

## 2019-07-20 ENCOUNTER — Encounter: Payer: Self-pay | Admitting: Family Medicine

## 2019-07-20 ENCOUNTER — Other Ambulatory Visit: Payer: Self-pay

## 2019-07-20 VITALS — BP 122/70 | HR 82 | Ht 70.0 in | Wt 232.0 lb

## 2019-07-20 DIAGNOSIS — I10 Essential (primary) hypertension: Secondary | ICD-10-CM

## 2019-07-20 DIAGNOSIS — I2510 Atherosclerotic heart disease of native coronary artery without angina pectoris: Secondary | ICD-10-CM

## 2019-07-20 DIAGNOSIS — L309 Dermatitis, unspecified: Secondary | ICD-10-CM | POA: Diagnosis not present

## 2019-07-20 DIAGNOSIS — Z0000 Encounter for general adult medical examination without abnormal findings: Secondary | ICD-10-CM

## 2019-07-20 DIAGNOSIS — F419 Anxiety disorder, unspecified: Secondary | ICD-10-CM

## 2019-07-20 DIAGNOSIS — E1169 Type 2 diabetes mellitus with other specified complication: Secondary | ICD-10-CM | POA: Diagnosis not present

## 2019-07-20 DIAGNOSIS — F331 Major depressive disorder, recurrent, moderate: Secondary | ICD-10-CM

## 2019-07-20 DIAGNOSIS — E1140 Type 2 diabetes mellitus with diabetic neuropathy, unspecified: Secondary | ICD-10-CM | POA: Diagnosis not present

## 2019-07-20 DIAGNOSIS — F5101 Primary insomnia: Secondary | ICD-10-CM

## 2019-07-20 DIAGNOSIS — E785 Hyperlipidemia, unspecified: Secondary | ICD-10-CM

## 2019-07-20 MED ORDER — DESOXIMETASONE 0.25 % EX CREA: 1.0000 "application " | TOPICAL_CREAM | Freq: Two times a day (BID) | CUTANEOUS | 2 refills | Status: DC | PRN

## 2019-07-20 MED ORDER — OZEMPIC (1 MG/DOSE) 2 MG/1.5ML ~~LOC~~ SOPN: 1.0000 mg | PEN_INJECTOR | SUBCUTANEOUS | 3 refills | Status: DC

## 2019-07-20 NOTE — Assessment & Plan Note (Addendum)
Stable without anginal symptoms Followed by Cardiology On ASA, Statin

## 2019-07-20 NOTE — Assessment & Plan Note (Signed)
Stable, controlled chronic mood disorder Followed by Psychiatry [facility] On Duloxetine, Wellbutrin, Alprazolam

## 2019-07-20 NOTE — Progress Notes (Signed)
Subjective:    Patient ID: Dale Bunting., male    DOB: [birth year], 58 y.o.   MRN: [id number]  Dale [name]. is a 58 y.o. male presenting on 07/20/2019 for Annual Exam   HPI   Here for Annual Physical and Lab Review  CHRONIC DM, Type 2/ Peripheral Neuropathy Today he reports improved neuropathy now with better sugar control on ozempic. Reduced fluctuations of sugar and high readings, now more steady. Last A1c result 6.2 now - previous 6.9 CBGimproved, no log today Meds:Metformin 1085m BID, Ozempic 0.565mweekly inj Reports good compliance. Tolerating well w/o side-effects Currently on ACEilow dose Lifestyle: - Diet: reduced portions improved - Exercise (limited due to pain and neuropathy) Chronic numbness tingling feet, with history of other neuropathy, [facility] limited benefit from Podiatry as well at previous visits. Podiatry gave him shoe insoles and neurology continued current meds with gabapentin [phone], Cymbalta 6045mand offered Scrambler therapy / TENS unit for feet previously. Ultimately determined to be related to complication from prior back surgery Last DM Eye [facility] [place] - 10/2018 Denies hypoglycemia, polyuria, visual changes  Hepatic Steatosis / Fatty Liver / Chronic Abdominal pain postprandial See prior note for background. He is followed by [facility] - Dr JohWynetta Emeryrecently had Colonoscopy and EGD, see report notes, later followed by GI for chronic abdominal pain, proceeded with imaging Abdominal US Koread Hepatic Fibrascan - see results below, he was concerned by report of diagnosis "fibrosis" and at risk of cirrhosis, his father passed away due to alcoholic cirrhosis, and patient does not drink alcohol anymore. - Last visit with GI was in 12/2018  PMH - Chronic Pain Syndrome  HYPERLIPIDEMIA: - Reports no concerns. Last lipid panel improved control 07/2019, controlled LDL, mild elevated TG but still improved. - Currently taking  Atorvastatin 44m40molerating well without side effects or myalgias   Additional update  Needs refill Topicort for eczema PRN.  Health Maintenance:  PSA 0.5, negative screening, 07/13/19  Due [facility], [facility]    Depression screen PHQ [facility] 07/20/2019 04/13/2019 01/14/2019  Decreased Interest 0 0 2  Down, Depressed, Hopeless 0 1 2  PHQ - 2 Score 0 1 4  Altered sleeping _0 Tired, decreased energy 3 0 2  Change in appetite 0 0 3  Feeling bad or failure about yourself  0 0 1  Trouble concentrating 0 0 0  Moving slowly or fidgety/restless 0 0 0  Suicidal thoughts 0 0 0  PHQ-9 Score _1 Difficult doing work/chores Not difficult at all Not difficult at all Somewhat difficult   GAD 7 : Generalized Anxiety Score 04/13/2019  Nervous, Anxious, on Edge 1  Control/stop worrying 0  Worry too much - different things 0  Trouble relaxing 0  Restless 0  Easily annoyed or irritable 1  Afraid - awful might happen 0  Total GAD 7 Score 2  Anxiety Difficulty Not difficult at all     Past Medical History:  Diagnosis Date  . ADHD (attention deficit hyperactivity disorder)   . Anxiety   . Coronary artery disease   . Coronary artery disease   . Coronary atherosclerosis   . Depression   . GERD (gastroesophageal reflux disease)   . Glaucoma   . Headache    migrane  . Heart disease   . Hyperlipidemia   . Hypertension   . Hypertriglyceridemia   . Hypogonadism in male   .  MVP (mitral valve prolapse)   . Narcolepsy and cataplexy   . Non-alcoholic fatty liver disease    Past Surgical History:  Procedure Laterality Date  . COLONOSCOPY WITH PROPOFOL N/A 11/18/2018   Procedure: COLONOSCOPY WITH PROPOFOL;  Surgeon: Manya Silvas, MD;  Location[facility] ENDOSCOPY;  Service: Endoscopy;  Laterality: N/A;  . ESOPHAGOGASTRODUODENOSCOPY (EGD) WITH PROPOFOL N/A 11/18/2018   Procedure: ESOPHAGOGASTRODUODENOSCOPY (EGD) WITH PROPOFOL;  Surgeon: Manya Silvas, MD;   Location: [facility] ENDOSCOPY;  Service: Endoscopy;  Laterality: N/A;  . pain pump implant and removal Bilateral    Dec. 21 2019  . SPINE SURGERY     Multiple surgeries, initial 2001, thoracic disc repair and recurrent revisions.  . THROAT SURGERY     Social History   Socioeconomic History  . Marital status: Married    Spouse name: Not on file  . Number of children: Not on file  . Years of education: 1 yr of college  . Highest education level: Not on file  Occupational History  . Occupation: Disabled  Social Needs  . Financial resource strain: Not on file  . Food insecurity    Worry: Not on file    Inability: Not on file  . Transportation needs    Medical: Not on file    Non-medical: Not on file  Tobacco Use  . Smoking status: Former Smoker    Types: Cigarettes    Start date: 08/29/1979    Quit date: 11/04/2010    Years since quitting: 8.7  . Smokeless tobacco: Former Systems developer    Quit date: 11/19/2010  . Tobacco comment: 1 to 1.5 packs per day  Substance and Sexual Activity  . Alcohol use: No    Alcohol/week: 0.0 standard drinks  . Drug use: No  . Sexual activity: Not Currently  Lifestyle  . Physical activity    Days per week: Not on file    Minutes per session: Not on file  . Stress: Not on file  Relationships  . Social Herbalist on phone: Not on file    Gets together: Not on file    Attends religious service: Not on file    Active member of club or organization: Not on file    Attends meetings of clubs or organizations: Not on file    Relationship status: Not on file  . Intimate partner violence    Fear of current or ex partner: Not on file    Emotionally abused: Not on file    Physically abused: Not on file    Forced sexual activity: Not on file  Other Topics Concern  . Not on file  Social History Narrative  . Not on file   Family History  Problem Relation Age of Onset  . Depression Mother   . Anxiety disorder Mother   . Diabetes Mother   . Heart  failure Mother   . Heart attack Father   . Diabetes Sister   . Obesity Sister   . Drug abuse Brother   . Alcohol abuse Brother   . Depression Sister   . Anxiety disorder Sister   . Drug abuse Sister   . Anxiety disorder Sister   . Seizures Sister   . Diabetes Sister   . Depression Sister    Current Outpatient Medications on File Prior to Visit  Medication Sig  . acetaminophen (TYLENOL) 500 MG tablet Take by mouth.  . ALPRAZolam (XANAX) 1 MG tablet Take 1 mg by mouth 3 (three) times  daily.  . amLODipine (NORVASC) 5 MG tablet Take 5 mg by mouth daily.  Marland Kitchen amphetamine-dextroamphetamine (ADDERALL XR) 10 MG 24 hr capsule Take 10 mg by mouth daily.  Marland Kitchen aspirin EC 81 MG tablet Take 81 mg by mouth daily.  Marland Kitchen atorvastatin (LIPITOR) 10 MG tablet TAKE 1 TABLET BY MOUTH DAILY AT 2 PM  . b complex vitamins capsule Take by mouth.  . Blood Glucose Monitoring Suppl (ONETOUCH VERIO FLEX SYSTEM) w/Device KIT Check blood sugar up to 2 times daily  . buPROPion (WELLBUTRIN XL) 300 MG 24 hr tablet Take 1 tablet (300 mg total) by mouth daily.  . butalbital-acetaminophen-caffeine (FIORICET, ESGIC) 50-325-40 MG tablet Take 1 tablet by mouth daily as needed for headache.  . cholecalciferol (VITAMIN D) 400 units TABS tablet Take 400 Units by mouth.  . co-enzyme Q-10 30 MG capsule Take 30 mg by mouth 3 (three) times daily.  . Continuous Blood Gluc Receiver (FREESTYLE LIBRE 14 DAY READER) DEVI 1 Device by Does not apply route daily.  . Continuous Blood Gluc Sensor (FREESTYLE LIBRE 14 DAY SENSOR) MISC 1 each by Does not apply route every 14 (fourteen) days.  . diclofenac sodium (VOLTAREN) 1 % GEL Apply topically.  . dicyclomine (BENTYL) 20 MG tablet TAKE 1 TABLET (20 MG TOTAL) BY MOUTH EVERY 6 (SIX) HOURS  . DULoxetine (CYMBALTA) 30 MG capsule TAKE 1 CAPSULE (30 MG TOTAL) BY MOUTH DAILY. TAKE WITH EXISTING 60MG DOSE = TOTAL DAILY DOSE 90MG  . DULoxetine (CYMBALTA) 60 MG capsule Take 1 capsule (60 mg total) by mouth  daily.  . fluticasone (FLONASE) 50 MCG/ACT nasal spray Place 2 sprays into both nostrils daily.  Marland Kitchen gabapentin (NEURONTIN) 300 MG capsule Take 1 capsule (300 mg total) by mouth 3 (three) times daily. (Patient taking differently: Take 300 mg by mouth 3 (three) times daily. )  . gentamicin cream (GARAMYCIN) 0.1 % Apply 1 application topically 2 (two) times daily.  . hydrochlorothiazide (HYDRODIURIL) 25 MG tablet TAKE 1 TABLET BY MOUTH EVERY DAY  . hydrOXYzine (VISTARIL) 25 MG capsule 25 mg.  . Lancets (ONETOUCH ULTRASOFT) lancets Check blood sugar up to 2 times daily  . lisinopril (PRINIVIL,ZESTRIL) 5 MG tablet Take 1 tablet (5 mg total) by mouth daily.  . meloxicam (MOBIC) 7.5 MG tablet   . metFORMIN (GLUCOPHAGE) 1000 MG tablet Take 1 tablet (1,000 mg total) by mouth 2 (two) times daily with a meal.  . Multiple Vitamin (MULTIVITAMIN) tablet Take 1 tablet by mouth daily.  . mupirocin ointment (BACTROBAN) 2 % Apply 1 application topically 2 (two) times daily. Up to 1-2 weeks as needed to prevent infection  . NEEDLE, DISP, 18 G (B-D DISP NEEDLE TW 18GX1") 18G X 1" MISC For use to draw up medication into the syringe  . NEEDLE, DISP, 21 G (BD SAFETYGLIDE SHIELDED NEEDLE) 21G X 1-1/2" MISC For use of administering medication in the IM location  . Omega-3 Fatty Acids (FISH OIL) 1200 MG CAPS Take 1 capsule (1,200 mg total) by mouth daily.  Glory Rosebush VERIO test strip Check blood sugar up to 2 times daily  . pantoprazole (PROTONIX) 40 MG tablet Take 40 mg by mouth 2 (two) times daily.  . Syringe, Disposable, 3 ML MISC Use syringes as directed by physician  . testosterone cypionate (DEPOTESTOSTERONE CYPIONATE) 200 MG/ML injection Inject 1 mL (200 mg total) into the muscle every 14 (fourteen) days.  Marland Kitchen tiZANidine (ZANAFLEX) 2 MG tablet   . traMADol (ULTRAM) 50 MG tablet Take by mouth  every 6 (six) hours as needed.  . zinc gluconate 50 MG tablet Take by mouth.   No current facility-administered medications  on file prior to visit.     Review of Systems  Constitutional: Negative for activity change, appetite change, chills, diaphoresis, fatigue and fever.  HENT: Negative for congestion and hearing loss.   Eyes: Negative for visual disturbance.  Respiratory: Negative for apnea, cough, choking, chest tightness, shortness of breath and wheezing.   Cardiovascular: Negative for chest pain, palpitations and leg swelling.  Gastrointestinal: Negative for abdominal pain, anal bleeding, blood in stool, constipation, diarrhea, nausea and vomiting.  Endocrine: Negative for cold intolerance.  Genitourinary: Negative for decreased urine volume, difficulty urinating, dysuria, frequency, hematuria, testicular pain and urgency.  Musculoskeletal: Negative for arthralgias, back pain and neck pain.  Skin: Negative for rash.  Allergic/Immunologic: Negative for environmental allergies.  Neurological: Positive for numbness (diabetic neuropathy). Negative for dizziness, weakness, light-headedness and headaches.  Hematological: Negative for adenopathy.  Psychiatric/Behavioral: Negative for behavioral problems, dysphoric mood and sleep disturbance. The patient is not nervous/anxious.    Per HPI unless specifically indicated above      Objective:    BP 122/70   Pulse 82   Ht _0  (1.778 m)   Wt 232 lb (105.2 kg)   SpO2 96%   BMI 33.29 kg/m   Wt Readings from Last 3 Encounters:  07/20/19 232 lb (105.2 kg)  06/28/19 232 lb (105.2 kg)  04/13/19 236 lb (107 kg)    Physical Exam Vitals signs and nursing note reviewed.  Constitutional:      General: He is not in acute distress.    Appearance: He is well-developed. He is not diaphoretic.     Comments: Well-appearing, comfortable, cooperative  HENT:     Head: Normocephalic and atraumatic.  Eyes:     General:        Right eye: No discharge.        Left eye: No discharge.     Conjunctiva/sclera: Conjunctivae normal.     Pupils: Pupils are equal, round,  and reactive to light.  Neck:     Musculoskeletal: Normal range of motion and neck supple.     Thyroid: No thyromegaly.  Cardiovascular:     Rate and Rhythm: Normal rate and regular rhythm.     Heart sounds: Normal heart sounds. No murmur.  Pulmonary:     Effort: Pulmonary effort is normal. No respiratory distress.     Breath sounds: Normal breath sounds. No wheezing or rales.  Abdominal:     General: Bowel sounds are normal. There is no distension.     Palpations: Abdomen is soft. There is no mass.     Tenderness: There is no abdominal tenderness.  Musculoskeletal: Normal range of motion.        General: No tenderness.     Comments: Upper / Lower Extremities: - Normal muscle tone, strength bilateral upper extremities 5/5, lower extremities 5/5  Lymphadenopathy:     Cervical: No cervical adenopathy.  Skin:    General: Skin is warm and dry.     Findings: No erythema or rash.  Neurological:     Mental Status: He is alert and oriented to person, place, and time.     Comments: Distal sensation intact to light touch all extremities  Psychiatric:        Behavior: Behavior normal.     Comments: Well groomed, good eye contact, normal speech and thoughts    Results for orders placed  or performed in visit on 07/13/19  Vitamin B12  Result Value Ref Range   Vitamin B-12 547 200 - 1,100 pg/mL  PSA  Result Value Ref Range   PSA 0.5 < OR = 4.0 ng/mL  Lipid panel  Result Value Ref Range   Cholesterol 113 <200 mg/dL   HDL 32 (L) > OR = 40 mg/dL   Triglycerides 184 (H) <150 mg/dL   LDL Cholesterol (Calc) 55 mg/dL (calc)   Total CHOL/HDL Ratio 3.5 <5.0 (calc)   Non-HDL Cholesterol (Calc) 81 <130 mg/dL (calc)  COMPLETE METABOLIC PANEL WITH GFR  Result Value Ref Range   Glucose, Bld 98 65 - 99 mg/dL   BUN 24 7 - 25 mg/dL   Creat 1.07 0.70 - 1.33 mg/dL   GFR, Est Non African American 76 > OR = 60 mL/min/1.16m   GFR, Est African American 88 > OR = 60 mL/min/1.774m  BUN/Creatinine  Ratio NOT APPLICABLE 6 - 22 (calc)   Sodium 138 135 - 146 mmol/L   Potassium 4.6 3.5 - 5.3 mmol/L   Chloride 95 (L) 98 - 110 mmol/L   CO2 31 20 - 32 mmol/L   Calcium 10.6 (H) 8.6 - 10.3 mg/dL   Total Protein 7.3 6.1 - 8.1 g/dL   Albumin 4.6 3.6 - 5.1 g/dL   Globulin 2.7 1.9 - 3.7 g/dL (calc)   AG Ratio 1.7 1.0 - 2.5 (calc)   Total Bilirubin 0.5 0.2 - 1.2 mg/dL   Alkaline phosphatase (APISO) 80 35 - 144 U/L   AST 21 10 - 35 U/L   ALT 20 9 - 46 U/L  CBC with Differential/Platelet  Result Value Ref Range   WBC 10.5 3.8 - 10.8 Thousand/uL   RBC 5.51 4.20 - 5.80 Million/uL   Hemoglobin 14.2 13.2 - 17.1 g/dL   HCT 45.0 38.5 - 50.0 %   MCV 81.7 80.0 - 100.0 fL   MCH 25.8 (L) 27.0 - 33.0 pg   MCHC 31.6 (L) 32.0 - 36.0 g/dL   RDW 19.3 (H) 11.0 - 15.0 %   Platelets 291 140 - 400 Thousand/uL   MPV 10.8 7.5 - 12.5 fL   Neutro Abs 6,206 1,500 - 7,800 cells/uL   Lymphs Abs 2,867 850 - 3,900 cells/uL   Absolute Monocytes 956 (H) 200 - 950 cells/uL   Eosinophils Absolute 410 15 - 500 cells/uL   Basophils Absolute 63 0 - 200 cells/uL   Neutrophils Relative % 59.1 %   Total Lymphocyte 27.3 %   Monocytes Relative 9.1 %   Eosinophils Relative 3.9 %   Basophils Relative 0.6 %  Hemoglobin A1c  Result Value Ref Range   Hgb A1c MFr Bld 6.2 (H) <5.7 % of total Hgb   Mean Plasma Glucose 131 (calc)   eAG (mmol/L) 7.3 (calc)      Assessment & Plan:   Problem List Items Addressed This Visit    Anxiety    Controlled on current medications Followed by Psychiatry On Xanax, Wellbutrin      CAD (coronary artery disease), native coronary artery    Stable without anginal symptoms Followed by Cardiology On ASA, Statin      Eczema    Refill Topicort      Relevant Medications   desoximetasone (TOPICORT) 0.25 % cream   Essential (primary) hypertension    Well-controlled HTN - Home BP readings normal  Complication with history of CAD    Plan:  1. Trial to reduce BP meds - can start  with  Amlodipine 24m - now to HALF tab for dose of 2.567mfor few week trial may be able to DC med, also could have caused some edema as side effect -  Continue other meds - Lisinopril 5m56maily, HCTZ 59m34mily 2. Encourage improved lifestyle - low sodium diet, regular exercise 3. Continue monitor BP outside office, bring readings to next visit, if persistently >140/90 or new symptoms notify office sooner      Hyperlipidemia associated with type 2 diabetes mellitus (HCC)[place] Controlled LDL < 70 on statin Last lipid panel 07/2019 Calculated ASCVD 10 yr risk score elevated  Plan: Continue current meds - Atorvastatin 10mg91mourage improved lifestyle - low carb/cholesterol, reduce portion size, continue improving regular exercise      Relevant Medications   OZEMPIC, 1 MG/DOSE, 2 MG/1.5ML SOPN   Insomnia   Moderate episode of recurrent major depressive disorder (HCC)    Stable, controlled chronic mood disorder Followed by Psychiatry CBC On Duloxetine, Wellbutrin, Alprazolam      Type 2 diabetes, controlled, with neuropathy (HCC) [place] improvement in DM control, A1c down to 6.2, previous 6.9 Improved on GLP1 Resolved hyperglycemia, more stabilized sugar now Complications - peripheral neuropathy (also secondary to multiple back surgeries, with nerve damage), Hyperlipidemia, GERD  Plan:  1. INCREASE Ozempic from 0.5mg w22mly up to 1mg we71my inj -  Continue current therapy - Metformin 1000mg BI36may reduce in future) 2. Encourage improved lifestyle - low carb, low sugar diet, reduce portion size, continue improving regular exercise 3. Check CBG, bring log to next visit for review 4. Continue ACEi, Statin 5. DM eye in 2020 due 6. Follow-up 6 months      Relevant Medications   OZEMPIC, 1 MG/DOSE, 2 MG/1.5ML SOPN    Other Visit Diagnoses    Annual physical exam    -  Primary       Updated Health Maintenance information Reviewed recent lab results with patient Encouraged  improvement to lifestyle with diet and exercise - Goal of weight loss. Improving on ozempic, lifestyle   Meds ordered this encounter  Medications  . desoximetasone (TOPICORT) 0.25 % cream    Sig: Apply 1 application topically 2 (two) times daily as needed.    Dispense:  15 g    Refill:  2  . OZEMPIC, 1 MG/DOSE, 2 MG/1.5ML SOPN    Sig: Inject 1 mg into the skin once a week.    Dispense:  6 pen    Refill:  3    Dose increase from 0.5 weekly up to 1mg     47mlow up plan: Return in about 6 months (around 01/17/2020) for 6 month DM A1c.  AlexanderNobie Putnamh [facility] 07/20/2019, 10:37 AM

## 2019-07-20 NOTE — Assessment & Plan Note (Signed)
Controlled LDL < 70 on statin Last lipid panel 07/2019 Calculated ASCVD 10 yr risk score elevated  Plan: Continue current meds - Atorvastatin 10mg  Encourage improved lifestyle - low carb/cholesterol, reduce portion size, continue improving regular exercise

## 2019-07-20 NOTE — Patient Instructions (Addendum)
Thank you for coming to the office today.  Increased dose on Ozempic up to 1mg  - sent to [facility]  Re ordered cream locally  Reminder to get DIabetic Eye Exam (have them fax [place] copy - [facility] [place]) October 2020  Try to reduce Amlodipine 5mg  - can cut in HALF first for few weeks to see how you do, if ready can stop medication completely for a trial off. Let me know - mychart message. With update. You may have less swelling off of this medicine.  Consider Adderall or Tizanidine muscle relaxant if these are causing tremors.   Please schedule a Follow-up Appointment to: Return in about 6 months (around 01/17/2020) for 6 month DM A1c.  If you have any other questions or concerns, please feel free to call the office or send a message through [place]. You may also schedule an earlier appointment if necessary.  Additionally, you may be receiving a survey about your experience at our office within a few days to 1 week by e-mail or mail. We value your feedback.  [name], DO [place]

## 2019-07-20 NOTE — Assessment & Plan Note (Signed)
Notable improvement in DM control, A1c down to 6.2, previous 6.9 Improved on GLP1 Resolved hyperglycemia, more stabilized sugar now Complications - peripheral neuropathy (also secondary to multiple back surgeries, with nerve damage), Hyperlipidemia, GERD  Plan:  1. INCREASE Ozempic from 0.5mg  weekly up to 1mg  weekly inj -  Continue current therapy - Metformin 1000mg  BID (may reduce in future) 2. Encourage improved lifestyle - low carb, low sugar diet, reduce portion size, continue improving regular exercise 3. Check CBG, bring log to next visit for review 4. Continue ACEi, Statin 5. DM eye in 2020 due 6. Follow-up 6 months

## 2019-07-20 NOTE — Assessment & Plan Note (Signed)
Controlled on current medications Followed by Psychiatry On Xanax, Wellbutrin

## 2019-07-20 NOTE — Assessment & Plan Note (Addendum)
Well-controlled HTN - Home BP readings normal  Complication with history of CAD    Plan:  1. Trial to reduce BP meds - can start with Amlodipine 5mg  - now to HALF tab for dose of 2.5mg  for few week trial may be able to DC med, also could have caused some edema as side effect -  Continue other meds - Lisinopril 5mg  daily, HCTZ 25mg  daily 2. Encourage improved lifestyle - low sodium diet, regular exercise 3. Continue monitor BP outside office, bring readings to next visit, if persistently >140/90 or new symptoms notify office sooner

## 2019-07-20 NOTE — Assessment & Plan Note (Signed)
Refill Topicort

## 2019-07-27 ENCOUNTER — Other Ambulatory Visit: Payer: Self-pay | Admitting: Family Medicine

## 2019-07-27 DIAGNOSIS — E1140 Type 2 diabetes mellitus with diabetic neuropathy, unspecified: Secondary | ICD-10-CM

## 2019-07-28 ENCOUNTER — Other Ambulatory Visit: Payer: Self-pay | Admitting: Family Medicine

## 2019-08-02 ENCOUNTER — Encounter: Payer: Self-pay | Admitting: Family Medicine

## 2019-08-03 ENCOUNTER — Ambulatory Visit: Payer: 59 | Admitting: Podiatry

## 2019-08-22 ENCOUNTER — Other Ambulatory Visit: Payer: Self-pay | Admitting: Family Medicine

## 2019-08-22 DIAGNOSIS — E1140 Type 2 diabetes mellitus with diabetic neuropathy, unspecified: Secondary | ICD-10-CM

## 2019-10-20 DIAGNOSIS — G6289 Other specified polyneuropathies: Secondary | ICD-10-CM | POA: Diagnosis not present

## 2019-10-20 DIAGNOSIS — G8921 Chronic pain due to trauma: Secondary | ICD-10-CM | POA: Diagnosis not present

## 2019-10-20 DIAGNOSIS — M961 Postlaminectomy syndrome, not elsewhere classified: Secondary | ICD-10-CM | POA: Diagnosis not present

## 2019-12-06 ENCOUNTER — Other Ambulatory Visit: Payer: Self-pay

## 2019-12-06 NOTE — Patient Outreach (Signed)
[facility] [facility]) Care Management  12/06/2019  Dale [name]. [birth year] [id number]   Medication Adherence call to Mr. Dale [name] HIPPA Compliant Voice message left with a call back number. [name] is showing past due on Metformin 1000 mg under [place].   [facility] Management Direct Dial [phone]  Fax [phone] [name]

## 2019-12-09 DIAGNOSIS — F418 Other specified anxiety disorders: Secondary | ICD-10-CM | POA: Diagnosis not present

## 2019-12-09 DIAGNOSIS — F331 Major depressive disorder, recurrent, moderate: Secondary | ICD-10-CM | POA: Diagnosis not present

## 2019-12-09 DIAGNOSIS — Z79899 Other long term (current) drug therapy: Secondary | ICD-10-CM | POA: Diagnosis not present

## 2019-12-20 ENCOUNTER — Other Ambulatory Visit: Payer: Self-pay | Admitting: Family Medicine

## 2019-12-20 DIAGNOSIS — E1140 Type 2 diabetes mellitus with diabetic neuropathy, unspecified: Secondary | ICD-10-CM

## 2019-12-20 NOTE — Telephone Encounter (Signed)
Pt. Called requesting refill on  ozempic called into [facility] [place]

## 2019-12-21 MED ORDER — OZEMPIC (1 MG/DOSE) 2 MG/1.5ML ~~LOC~~ SOPN: 1.0000 mg | PEN_INJECTOR | SUBCUTANEOUS | 3 refills | Status: DC

## 2019-12-29 ENCOUNTER — Encounter: Payer: Self-pay | Admitting: Urology

## 2019-12-29 ENCOUNTER — Other Ambulatory Visit: Payer: 59

## 2020-01-06 DIAGNOSIS — F331 Major depressive disorder, recurrent, moderate: Secondary | ICD-10-CM | POA: Diagnosis not present

## 2020-01-06 DIAGNOSIS — Z79899 Other long term (current) drug therapy: Secondary | ICD-10-CM | POA: Diagnosis not present

## 2020-01-06 DIAGNOSIS — F418 Other specified anxiety disorders: Secondary | ICD-10-CM | POA: Diagnosis not present

## 2020-01-10 DIAGNOSIS — G894 Chronic pain syndrome: Secondary | ICD-10-CM | POA: Diagnosis not present

## 2020-01-10 DIAGNOSIS — G6289 Other specified polyneuropathies: Secondary | ICD-10-CM | POA: Diagnosis not present

## 2020-01-10 DIAGNOSIS — Z0289 Encounter for other administrative examinations: Secondary | ICD-10-CM | POA: Diagnosis not present

## 2020-01-10 DIAGNOSIS — M961 Postlaminectomy syndrome, not elsewhere classified: Secondary | ICD-10-CM | POA: Diagnosis not present

## 2020-01-10 DIAGNOSIS — M7918 Myalgia, other site: Secondary | ICD-10-CM | POA: Diagnosis not present

## 2020-01-10 DIAGNOSIS — G8921 Chronic pain due to trauma: Secondary | ICD-10-CM | POA: Diagnosis not present

## 2020-01-18 ENCOUNTER — Other Ambulatory Visit: Payer: Self-pay

## 2020-01-18 NOTE — Patient Outreach (Signed)
[place] [facility]) Care Management  01/18/2020  Trevis Nylen. Jun 30, 1961 [id number]   Medication Adherence call to Mr. Jerrell Ashmead HIPPA Compliant Voice message left with a call back number. Mr. Tram is showing past due on Atorvastatin 10 mg and Metformin 1000 mg under [place].   [facility] Management Direct Dial [phone]  Fax [phone] [email or web address]

## 2020-01-19 ENCOUNTER — Ambulatory Visit (INDEPENDENT_AMBULATORY_CARE_PROVIDER_SITE_OTHER): Payer: Medicare HMO | Admitting: Family Medicine

## 2020-01-19 ENCOUNTER — Other Ambulatory Visit: Payer: Self-pay

## 2020-01-19 ENCOUNTER — Encounter: Payer: Self-pay | Admitting: Family Medicine

## 2020-01-19 ENCOUNTER — Other Ambulatory Visit: Payer: Self-pay | Admitting: Family Medicine

## 2020-01-19 VITALS — BP 134/84 | HR 86 | Temp 97.1°F | Resp 16 | Ht 70.0 in | Wt 213.0 lb

## 2020-01-19 DIAGNOSIS — E669 Obesity, unspecified: Secondary | ICD-10-CM

## 2020-01-19 DIAGNOSIS — E1140 Type 2 diabetes mellitus with diabetic neuropathy, unspecified: Secondary | ICD-10-CM

## 2020-01-19 DIAGNOSIS — G250 Essential tremor: Secondary | ICD-10-CM

## 2020-01-19 DIAGNOSIS — G8929 Other chronic pain: Secondary | ICD-10-CM

## 2020-01-19 DIAGNOSIS — Z0000 Encounter for general adult medical examination without abnormal findings: Secondary | ICD-10-CM

## 2020-01-19 DIAGNOSIS — F331 Major depressive disorder, recurrent, moderate: Secondary | ICD-10-CM | POA: Diagnosis not present

## 2020-01-19 DIAGNOSIS — R109 Unspecified abdominal pain: Secondary | ICD-10-CM

## 2020-01-19 DIAGNOSIS — E1169 Type 2 diabetes mellitus with other specified complication: Secondary | ICD-10-CM

## 2020-01-19 DIAGNOSIS — I10 Essential (primary) hypertension: Secondary | ICD-10-CM

## 2020-01-19 DIAGNOSIS — Z125 Encounter for screening for malignant neoplasm of prostate: Secondary | ICD-10-CM

## 2020-01-19 DIAGNOSIS — E785 Hyperlipidemia, unspecified: Secondary | ICD-10-CM

## 2020-01-19 DIAGNOSIS — E538 Deficiency of other specified B group vitamins: Secondary | ICD-10-CM

## 2020-01-19 LAB — POCT GLYCOSYLATED HEMOGLOBIN (HGB A1C): Hemoglobin A1C: 5.6 % (ref 4.0–5.6)

## 2020-01-19 MED ORDER — AMLODIPINE BESYLATE 5 MG PO TABS: 5.0000 mg | ORAL_TABLET | Freq: Every day | ORAL | 3 refills | Status: DC

## 2020-01-19 MED ORDER — LISINOPRIL 5 MG PO TABS: 5.0000 mg | ORAL_TABLET | Freq: Every day | ORAL | 3 refills | Status: DC

## 2020-01-19 NOTE — Assessment & Plan Note (Signed)
Stable, controlled chronic mood disorder Followed by Psychiatry [facility] On Duloxetine, Wellbutrin, Alprazolam

## 2020-01-19 NOTE — Patient Instructions (Addendum)
Thank you for coming to the office today.  For blood sugar  Recent Labs    07/13/19 0810 01/19/20 1113  HGBA1C 6.2* 5.6   Great job on [employer]. Keep on 1mg  weekly - you should have plenty of refills. Note - they will change the pen eventually so it is 1 pen per month for 1mg  dosage.  Try to stop or scale back on Metformin. If need to, can go back to maybe a HALF dose 500mg  twice a day.  For Blood pressure Due to elevated bottom number, we can restart Amlodipine 5mg  daily (or lisinopril whichever one you stopped). And REDUCE HCTZ by half - so dose would be 12.5mg  daily, if you want to continue on this regimen, message or call in 4-6 weeks and we can re order the HCTZ when you request at a 12.5 pill instead so you don't have to cut in half.  For tremors if not better can message in 2-4 weeks, we can look at BP numbers and consider an add on med for tremors. Or in future refer to Neuro  Please schedule a Follow-up Appointment to: Return in about 6 months (around 07/21/2020) for Annual Physical.  If you have any other questions or concerns, please feel free to call the office or send a message through [place]. You may also schedule an earlier appointment if necessary.  Additionally, you may be receiving a survey about your experience at our office within a few days to 1 week by e-mail or mail. We value your feedback.  [name], DO [place]

## 2020-01-19 NOTE — Assessment & Plan Note (Signed)
Mild elevated DBP, improve reading on recheck - Home BP readings normal due to recheck more, and outside BP mild elevated DBP Complication with history of CAD    Plan:  1. RESTART Amlodipine 5mg  daily, continue / resume Lisinopril 5mg  daily - REDUCE HCTZ by half now, from 25 to 12.5mg  2. Encourage improved lifestyle - low sodium diet, regular exercise 3. Continue monitor BP outside office, bring readings to next visit, if persistently >140/90 or new symptoms notify office sooner

## 2020-01-19 NOTE — Progress Notes (Signed)
Subjective:    Patient ID: Dale [name]., male    DOB: 19-May-1961, 59 y.o.   MRN: [id number]  Dale Mackley. is a 59 y.o. male presenting on 01/19/2020 for Diabetes   HPI   CHRONIC DM, Type 2/ Peripheral Neuropathy Today he reports overall pleased with good results. Sugar control and weight loss, last visit ozempic was inc from 0.5 up to 1mg  weekly (07/2019) Today A1c 5.6, previously 6 range CBGimproved Meds:Metformin 1000mg  BID, Ozempic 1mg  weekly inj Reports good compliance. Tolerating well w/o side-effects Currently on ACEilow dose Lifestyle: - Weight down about 20 lbs in 6 months - Diet: reduced portions improved - Exercise (limited due to pain and neuropathy) Chronic numbness tingling feet, with history of other neuropathy, followed by [facility] Neurology- limited benefit from Podiatry as well at previous visits. Podiatry gave him shoe insoles and neurology continued current meds with gabapentin [phone], Cymbalta 60mg , and offered Scrambler therapy / TENS unit for feetpreviously. Ultimately determined to be related to complication from prior back surgery Last DM [facility] [place] - 07/2019 Denies hypoglycemia, polyuria, visual changes  CHRONIC HTN: Reports recent readings at doctors office high DBP 90-100+, he has not checked home BP as much lately Current Meds - HCTZ 25mg  daily, Lisinopril 5mg  - OFF Amlodipine or possibly Lisinopril, now  He will check meds Reports good compliance, took meds today. Tolerating well, w/o complaints. Denies CP, dyspnea, HA, edema, dizziness / lightheadedness  Essential Tremors Worse in past 6 months, off amlodipine or lisinopril - not on other meds BB or primidone, not seeing neurologist  Major Depression, recurrent No new concerns Followed by [facility]iatry at [facility] On med management  PMH Hepatic Steatosis / Fatty Liver PMH - Chronic Pain Syndrome   Depression screen [facility] 2/9 01/19/2020 07/20/2019 04/13/2019  Decreased  Interest 1 0 0  Down, Depressed, Hopeless 1 0 1  PHQ - 2 Score 2 0 1  Altered sleeping 0 3 1  Tired, decreased energy 2 3 0  Change in appetite 0 0 0  Feeling bad or failure about yourself  0 0 0  Trouble concentrating 1 0 0  Moving slowly or fidgety/restless 0 0 0  Suicidal thoughts 0 0 0  PHQ-9 Score 5 6 2   Difficult doing work/chores Somewhat difficult Not difficult at all Not difficult at all   GAD 7 : Generalized Anxiety Score 01/19/2020 04/13/2019  Nervous, Anxious, on Edge 1 1  Control/stop worrying 0 0  Worry too much - different things 0 0  Trouble relaxing 1 0  Restless 0 0  Easily annoyed or irritable 1 1  Afraid - awful might happen 0 0  Total GAD 7 Score 3 2  Anxiety Difficulty Somewhat difficult Not difficult at all     Social History   Tobacco Use  . Smoking status: Former Smoker    Types: Cigarettes    Start date: 08/29/1979    Quit date: 11/04/2010    Years since quitting: 9.2  . Smokeless tobacco: Former Systems developer    Quit date: 11/19/2010  . Tobacco comment: 1 to 1.5 packs per day  Substance Use Topics  . Alcohol use: No    Alcohol/week: 0.0 standard drinks  . Drug use: No    Review of Systems Per HPI unless specifically indicated above     Objective:    BP 134/84 (BP Location: Left Arm, Cuff Size: Normal)   Pulse 86   Temp (!) 97.1 F (36.2 C) (Temporal)  Resp 16   Ht 5\' 10"  (1.778 m)   Wt 213 lb (96.6 kg)   BMI 30.56 kg/m   Wt Readings from Last 3 Encounters:  01/19/20 213 lb (96.6 kg)  07/20/19 232 lb (105.2 kg)  06/28/19 232 lb (105.2 kg)     Physical Exam   Recent Labs    07/13/19 0810 01/19/20 1113  HGBA1C 6.2* 5.6     Results for orders placed or performed in visit on 01/19/20  POCT HgB A1C  Result Value Ref Range   Hemoglobin A1C 5.6 4.0 - 5.6 %      Assessment & Plan:   Problem List Items Addressed This Visit    Type 2 diabetes, controlled, with neuropathy ([place]) - Primary    Significantly improved DM control now on  Ozempic higher dose, A1c at 5.6 Weight loss on med Resolved hyperglycemia, more stabilized sugar now Complications - peripheral neuropathy (also secondary to multiple back surgeries, with nerve damage), Hyperlipidemia, GERD  Plan:  1. Continue Ozempic 1mg  weekly inj - may DC or reduce dose Metformin to see if tolerated 2. Encourage improved lifestyle - low carb, low sugar diet, reduce portion size, continue improving regular exercise 3. Check CBG, bring log to next visit for review 4. Continue ACEi, Statin Follow-up 6 months      Relevant Medications   lisinopril (ZESTRIL) 5 MG tablet   Other Relevant Orders   POCT HgB A1C (Completed)   Moderate episode of recurrent major depressive disorder (HCC)    Stable, controlled chronic mood disorder Followed by Psychiatry [facility] On Duloxetine, Wellbutrin, Alprazolam      Essential (primary) hypertension    Mild elevated DBP, improve reading on recheck - Home BP readings normal due to recheck more, and outside BP mild elevated DBP Complication with history of CAD    Plan:  1. RESTART Amlodipine 5mg  daily, continue / resume Lisinopril 5mg  daily - REDUCE HCTZ by half now, from 25 to 12.5mg  2. Encourage improved lifestyle - low sodium diet, regular exercise 3. Continue monitor BP outside office, bring readings to next visit, if persistently >140/90 or new symptoms notify office sooner      Relevant Medications   hydrochlorothiazide (HYDRODIURIL) 25 MG tablet   amLODipine (NORVASC) 5 MG tablet   lisinopril (ZESTRIL) 5 MG tablet   Benign essential tremor    Chronic problem Recent worsening Maybe worse off Amlodipine? Trial back on Amlodipine, unless the meds were mixed up and he stopped lisinopril instead F/u with patient within few weeks to see if improved, otherwise consider add BB propranolol or primidone option or neurologist         Meds ordered this encounter  Medications  . amLODipine (NORVASC) 5 MG tablet    Sig: Take 1  tablet (5 mg total) by mouth daily.    Dispense:  90 tablet    Refill:  3  . lisinopril (ZESTRIL) 5 MG tablet    Sig: Take 1 tablet (5 mg total) by mouth daily.    Dispense:  90 tablet    Refill:  3      Follow up plan: Return in about 6 months (around 07/21/2020) for Annual Physical.  Future labs ordered for 07/18/20   [name], DO [facility] 01/19/2020, 11:12 AM

## 2020-01-19 NOTE — Assessment & Plan Note (Signed)
Chronic problem Recent worsening Maybe worse off Amlodipine? Trial back on Amlodipine, unless the meds were mixed up and he stopped lisinopril instead F/u with patient within few weeks to see if improved, otherwise consider add BB propranolol or primidone option or neurologist

## 2020-01-19 NOTE — Assessment & Plan Note (Signed)
Significantly improved DM control now on Ozempic higher dose, A1c at 5.6 Weight loss on med Resolved hyperglycemia, more stabilized sugar now Complications - peripheral neuropathy (also secondary to multiple back surgeries, with nerve damage), Hyperlipidemia, GERD  Plan:  1. Continue Ozempic 1mg  weekly inj - may DC or reduce dose Metformin to see if tolerated 2. Encourage improved lifestyle - low carb, low sugar diet, reduce portion size, continue improving regular exercise 3. Check CBG, bring log to next visit for review 4. Continue ACEi, Statin Follow-up 6 months

## 2020-02-07 DIAGNOSIS — F5101 Primary insomnia: Secondary | ICD-10-CM

## 2020-02-07 DIAGNOSIS — E785 Hyperlipidemia, unspecified: Secondary | ICD-10-CM

## 2020-02-07 DIAGNOSIS — I10 Essential (primary) hypertension: Secondary | ICD-10-CM

## 2020-02-07 DIAGNOSIS — E1169 Type 2 diabetes mellitus with other specified complication: Secondary | ICD-10-CM

## 2020-02-07 DIAGNOSIS — G603 Idiopathic progressive neuropathy: Secondary | ICD-10-CM

## 2020-02-07 DIAGNOSIS — E1140 Type 2 diabetes mellitus with diabetic neuropathy, unspecified: Secondary | ICD-10-CM

## 2020-02-07 DIAGNOSIS — F331 Major depressive disorder, recurrent, moderate: Secondary | ICD-10-CM

## 2020-02-07 MED ORDER — ATORVASTATIN CALCIUM 10 MG PO TABS: ORAL_TABLET | ORAL | 3 refills | Status: DC

## 2020-02-07 MED ORDER — HYDROCHLOROTHIAZIDE 25 MG PO TABS: 12.5000 mg | ORAL_TABLET | Freq: Every day | ORAL | 3 refills | Status: DC

## 2020-02-07 MED ORDER — DULOXETINE HCL 30 MG PO CPEP: 30.0000 mg | ORAL_CAPSULE | Freq: Every day | ORAL | 3 refills | Status: DC

## 2020-02-07 MED ORDER — METFORMIN HCL 1000 MG PO TABS: 1000.0000 mg | ORAL_TABLET | Freq: Two times a day (BID) | ORAL | 3 refills | Status: DC

## 2020-02-08 ENCOUNTER — Other Ambulatory Visit: Payer: Self-pay | Admitting: Urology

## 2020-02-12 ENCOUNTER — Other Ambulatory Visit: Payer: Self-pay | Admitting: Family Medicine

## 2020-02-12 DIAGNOSIS — E1140 Type 2 diabetes mellitus with diabetic neuropathy, unspecified: Secondary | ICD-10-CM

## 2020-02-14 NOTE — Telephone Encounter (Signed)
If this patient calls back he just needs a lab app see message below   [place]

## 2020-02-21 ENCOUNTER — Other Ambulatory Visit: Payer: Self-pay | Admitting: *Deleted

## 2020-02-21 ENCOUNTER — Other Ambulatory Visit: Payer: Medicare HMO

## 2020-02-21 ENCOUNTER — Other Ambulatory Visit: Payer: Self-pay

## 2020-02-21 DIAGNOSIS — E291 Testicular hypofunction: Secondary | ICD-10-CM

## 2020-02-22 ENCOUNTER — Telehealth: Payer: Self-pay | Admitting: *Deleted

## 2020-02-22 LAB — HEMATOCRIT: Hematocrit: 45.5 % (ref 37.5–51.0)

## 2020-02-22 LAB — TESTOSTERONE: Testosterone: 915 ng/dL (ref 264–916)

## 2020-02-22 NOTE — Telephone Encounter (Signed)
-----   Message from Abbie Sons, MD sent at 02/22/2020  1:44 PM EDT ----- Testosterone level looks good at 915.  Hematocrit was normal.

## 2020-02-22 NOTE — Telephone Encounter (Signed)
Notified patient as instructed, patient pleased. Discussed follow-up appointments, patient agrees  

## 2020-03-10 DIAGNOSIS — F5101 Primary insomnia: Secondary | ICD-10-CM

## 2020-03-10 DIAGNOSIS — F331 Major depressive disorder, recurrent, moderate: Secondary | ICD-10-CM

## 2020-03-10 DIAGNOSIS — G603 Idiopathic progressive neuropathy: Secondary | ICD-10-CM

## 2020-03-10 MED ORDER — BUPROPION HCL ER (XL) 300 MG PO TB24: 300.0000 mg | ORAL_TABLET | Freq: Every day | ORAL | 1 refills | Status: DC

## 2020-03-10 MED ORDER — DULOXETINE HCL 60 MG PO CPEP: 60.0000 mg | ORAL_CAPSULE | Freq: Every day | ORAL | 1 refills | Status: DC

## 2020-04-07 DIAGNOSIS — M7918 Myalgia, other site: Secondary | ICD-10-CM | POA: Diagnosis not present

## 2020-04-07 DIAGNOSIS — G6289 Other specified polyneuropathies: Secondary | ICD-10-CM | POA: Diagnosis not present

## 2020-04-07 DIAGNOSIS — M961 Postlaminectomy syndrome, not elsewhere classified: Secondary | ICD-10-CM | POA: Diagnosis not present

## 2020-04-07 DIAGNOSIS — G894 Chronic pain syndrome: Secondary | ICD-10-CM | POA: Diagnosis not present

## 2020-04-07 DIAGNOSIS — G8921 Chronic pain due to trauma: Secondary | ICD-10-CM | POA: Diagnosis not present

## 2020-04-18 ENCOUNTER — Telehealth: Payer: Self-pay | Admitting: Family Medicine

## 2020-04-18 DIAGNOSIS — E1140 Type 2 diabetes mellitus with diabetic neuropathy, unspecified: Secondary | ICD-10-CM

## 2020-04-18 NOTE — Telephone Encounter (Signed)
I called [facility]. As far as they can tell - they said the medicaid is APPROVED. But the cost is higher - most likely due to a Medicare Donut hole - it has to do with his insurance plan the cost may have increased.  I have already placed an order for CCM Pharmacy referral now to Harlow Asa [facility] to help with financial assistance on this one.  We do have ozempic samples. He can use the 0.5mg  sample and do 2 doses if he prefers on one day to equal 1mg  if he runs out and we need to give him a temporary supply until he can get any cost savings.  Likely other medication options will not be cheaper.  I called patient and he said it is the Medicare Donut hole as well - they should send [place] a form and to show it is medically necessary to get it at lower cost. They can resend the form.  He may come by for Ozempic pen sample if he wants to, advised him dosing.  [name], [facility] 04/18/2020, 5:00 PM

## 2020-04-18 NOTE — Telephone Encounter (Signed)
Pt called in stating his previous medication for OZEMPIC, 1 MG/DOSE, 2 MG/1.5ML SOPN [[id number]]   the price has went up and he is not able to afford the cost of meds . There is a form that needs to be filled out and faxed back to pharmacy . Please reach out to pharmacy  [id number] [place], [place]  [place] [address]  Phone: [phone] Fax: [phone]   Must send back to [place] /.

## 2020-04-18 NOTE — Telephone Encounter (Signed)
Can you check on what form this is?  If needed, we may need to involve [place] for CCM pharmacy referral if it is a cost saving program through [employer] or the company patient assistance program  [name], [facility] 04/18/2020, 3:07 PM

## 2020-04-20 ENCOUNTER — Telehealth: Payer: Self-pay

## 2020-04-20 NOTE — Telephone Encounter (Signed)
Called [employer] back and informed that all the information needed was faxed.

## 2020-04-20 NOTE — Telephone Encounter (Signed)
Copied from [place] [id number]. Topic: General - Other >> Apr 19, 2020  4:11 PM Oneta Rack wrote: Caller name: Selena  Relation to pt: [employer] Clinical review  Call back number: [phone] reference # [id number]   Reason for call:Clinical questionnaire form was faxed today to [phone] regarding OZEMPIC, 1 MG/DOSE, 2 MG/1.5ML SOPN, please note when received.

## 2020-04-21 ENCOUNTER — Telehealth: Payer: Self-pay | Admitting: Family Medicine

## 2020-04-21 ENCOUNTER — Ambulatory Visit: Payer: Self-pay | Admitting: Pharmacist

## 2020-04-21 NOTE — Chronic Care Management (AMB) (Signed)
  Chronic Care Management   Follow Up Note   04/21/2020 Name: Dale [name]. MRN: [id number] DOB: [birth year]  Referred by: Olin Hauser, DO Reason for referral : Chronic Care Management (Initial Patient Outreach Call)   Dale [name]. is a 59 y.o. year old male who is a primary care patient of Olin Hauser, DO. The CCM team was consulted for assistance with chronic disease management and care coordination needs.    Was unable to reach patient via telephone today and have left HIPAA compliant voicemail asking patient to return my call.   Plan  The care management team will reach out to the patient again over the next 30 days.   Harlow Asa, PharmD, [place] [employer] [phone]

## 2020-04-21 NOTE — Chronic Care Management (AMB) (Signed)
Chronic Care Management   Note  04/21/2020 Name: Dale [name]. MRN: [id number] DOB: [birth year]  [name] is a 58 y.o. year old male who is a primary care patient of Olin Hauser, DO. I reached out to [name] by phone today in response to a referral sent by Mr. MAREO PORTILLA Jr.'s PCP, [name] DO     Mr. Sawyers was given information about Chronic Care Management services today including:  1. CCM service includes personalized support from designated clinical staff supervised by his physician, including individualized plan of care and coordination with other care providers 2. 24/7 contact phone numbers for assistance for urgent and routine care needs. 3. Service will only be billed when office clinical staff spend 20 minutes or more in a month to coordinate care. 4. Only one practitioner may furnish and bill the service in a calendar month. 5. The patient may stop CCM services at any time (effective at the end of the month) by phone call to the office staff. 6. The patient will be responsible for cost sharing (co-pay) of up to 20% of the service fee (after annual deductible is met).  Patient agreed to services and verbal consent obtained.   Follow up plan: Telephone appointment with care management team member scheduled for:05/17/2020  Glenna Durand, LPN Health Advisor, [facility] ??[email or web address]  ??[phone]

## 2020-04-26 ENCOUNTER — Other Ambulatory Visit: Payer: Self-pay | Admitting: Family Medicine

## 2020-04-26 DIAGNOSIS — E1140 Type 2 diabetes mellitus with diabetic neuropathy, unspecified: Secondary | ICD-10-CM

## 2020-04-26 NOTE — Telephone Encounter (Signed)
Dale [name] from [facility] called in for medication refill for pt however meds are not on current medslist .   Tru metric air meter  tru metric test strips  Lancets ... please reach out to pharmacy     [phone]

## 2020-04-27 ENCOUNTER — Other Ambulatory Visit: Payer: Self-pay

## 2020-04-27 ENCOUNTER — Other Ambulatory Visit: Payer: Self-pay | Admitting: Student

## 2020-04-27 ENCOUNTER — Ambulatory Visit: Payer: Medicare HMO

## 2020-04-27 ENCOUNTER — Ambulatory Visit: Payer: Self-pay

## 2020-04-27 DIAGNOSIS — G8929 Other chronic pain: Secondary | ICD-10-CM | POA: Diagnosis not present

## 2020-04-27 DIAGNOSIS — K760 Fatty (change of) liver, not elsewhere classified: Secondary | ICD-10-CM | POA: Diagnosis not present

## 2020-04-27 DIAGNOSIS — R109 Unspecified abdominal pain: Secondary | ICD-10-CM | POA: Diagnosis not present

## 2020-04-27 DIAGNOSIS — K219 Gastro-esophageal reflux disease without esophagitis: Secondary | ICD-10-CM | POA: Diagnosis not present

## 2020-04-27 DIAGNOSIS — K5909 Other constipation: Secondary | ICD-10-CM | POA: Diagnosis not present

## 2020-04-27 LAB — HM DIABETES EYE EXAM

## 2020-04-27 MED ORDER — TRUE METRIX AIR GLUCOSE METER W/DEVICE KIT: PACK | 0 refills | Status: AC

## 2020-04-27 MED ORDER — TRUE METRIX BLOOD GLUCOSE TEST VI STRP: ORAL_STRIP | 5 refills | Status: DC

## 2020-04-27 MED ORDER — TRUE METRIX LEVEL 1 LOW VI SOLN: 5 refills | Status: DC

## 2020-04-27 MED ORDER — TRUEPLUS LANCETS 33G MISC: 5 refills | Status: DC

## 2020-04-27 MED ORDER — BD SWAB SINGLE USE REGULAR PADS: MEDICATED_PAD | 5 refills | Status: DC

## 2020-04-27 NOTE — Telephone Encounter (Signed)
I have already ordered this earlier today. I believe it should all be correct now.  [name], [facility] 04/27/2020, 5:08 PM

## 2020-04-27 NOTE — Telephone Encounter (Signed)
Ordered True Clinical research associate to [employer] as requested  [name], DO [facility] 04/27/2020, 1:21 PM

## 2020-04-28 ENCOUNTER — Ambulatory Visit: Payer: Self-pay

## 2020-05-01 ENCOUNTER — Ambulatory Visit: Payer: Self-pay | Admitting: Pharmacist

## 2020-05-01 NOTE — Chronic Care Management (AMB) (Signed)
  Chronic Care Management   Follow Up Note   05/01/2020 Name: Dale [name]. MRN: [id number] DOB: Oct 02, 1961  Referred by: Olin Hauser, DO Reason for referral : Chronic Care Management (Patient Phone Call)   Dale [name]. is a 59 y.o. year old male who is a primary care patient of Olin Hauser, DO. The CCM team was consulted for assistance with chronic disease management and care coordination needs.    Was unable to reach patient via telephone today and have left HIPAA compliant voicemail asking patient to return my call.   Plan  Face to Face appointment with care management team member scheduled for: 7/14 at 62 am  Harlow Asa, PharmD, [place] [phone]

## 2020-05-03 ENCOUNTER — Other Ambulatory Visit: Payer: Self-pay

## 2020-05-03 ENCOUNTER — Ambulatory Visit
Admission: RE | Admit: 2020-05-03 | Discharge: 2020-05-03 | Disposition: A | Discharge status: Home / Self Care | Payer: Medicare HMO | Source: Ambulatory Visit | Attending: Student | Admitting: Student

## 2020-05-03 DIAGNOSIS — K760 Fatty (change of) liver, not elsewhere classified: Secondary | ICD-10-CM | POA: Diagnosis not present

## 2020-05-03 DIAGNOSIS — N281 Cyst of kidney, acquired: Secondary | ICD-10-CM | POA: Diagnosis not present

## 2020-05-03 DIAGNOSIS — Q600 Renal agenesis, unilateral: Secondary | ICD-10-CM | POA: Diagnosis not present

## 2020-05-11 ENCOUNTER — Ambulatory Visit (INDEPENDENT_AMBULATORY_CARE_PROVIDER_SITE_OTHER): Payer: Medicare HMO | Admitting: Pharmacist

## 2020-05-11 DIAGNOSIS — E1140 Type 2 diabetes mellitus with diabetic neuropathy, unspecified: Secondary | ICD-10-CM

## 2020-05-11 NOTE — Chronic Care Management (AMB) (Signed)
Chronic Care Management   Follow Up Note   05/11/2020 Name: Dale [name]. MRN: [id number] DOB: 06-Feb-1961  Referred by: Olin Hauser, DO Reason for referral : Chronic Care Management (Patient Phone Call)   Dale [name]. is a 59 y.o. year old male who is a primary care patient of Olin Hauser, DO. The CCM team was consulted for assistance with chronic disease management and care coordination needs.    Receive voicemail message from patient requesting a call back.  I reached out to Dale [name]. by phone today.   Review of patient status, including review of consultants reports, relevant laboratory and other test results, and collaboration with appropriate care team members and the patient's provider was performed as part of comprehensive patient evaluation and provision of chronic care management services.     Outpatient Encounter Medications as of 05/11/2020  Medication Sig  . acetaminophen (TYLENOL) 500 MG tablet Take by mouth.  . Alcohol Swabs (B-D SINGLE USE SWABS REGULAR) PADS Check blood sugar 2 times daily.  Marland Kitchen ALPRAZolam (XANAX) 1 MG tablet Take 1 mg by mouth 3 (three) times daily.  Marland Kitchen amLODipine (NORVASC) 5 MG tablet Take 1 tablet (5 mg total) by mouth daily.  Marland Kitchen amphetamine-dextroamphetamine (ADDERALL XR) 10 MG 24 hr capsule Take 10 mg by mouth daily.  Marland Kitchen aspirin EC 81 MG tablet Take 81 mg by mouth daily.  Marland Kitchen atorvastatin (LIPITOR) 10 MG tablet TAKE 1 TABLET BY MOUTH DAILY AT 2 PM  . b complex vitamins capsule Take by mouth.  . Blood Glucose Calibration (TRUE METRIX LEVEL 1) Low SOLN Check blood sugar 2 times daily  . Blood Glucose Monitoring Suppl (TRUE METRIX AIR GLUCOSE METER) w/Device KIT Check blood sugar 2 times daily  . buPROPion (WELLBUTRIN XL) 300 MG 24 hr tablet Take 1 tablet (300 mg total) by mouth daily.  . cholecalciferol (VITAMIN D) 400 units TABS tablet Take 400 Units by mouth.  . co-enzyme Q-10 30 MG capsule Take 30 mg by  mouth 3 (three) times daily.  Marland Kitchen desoximetasone (TOPICORT) 0.25 % cream Apply 1 application topically 2 (two) times daily as needed.  . diclofenac sodium (VOLTAREN) 1 % GEL Apply topically.  . dicyclomine (BENTYL) 20 MG tablet TAKE 1 TABLET (20 MG TOTAL) BY MOUTH EVERY 6 (SIX) HOURS  . DULoxetine (CYMBALTA) 30 MG capsule Take 1 capsule (30 mg total) by mouth daily. Take with existing '60mg'$  dose = total daily dose '90mg'$   . DULoxetine (CYMBALTA) 60 MG capsule Take 1 capsule (60 mg total) by mouth daily. Take with existing '30mg'$  dose = total daily dose '90mg'$   . fluticasone (FLONASE) 50 MCG/ACT nasal spray Place 2 sprays into both nostrils daily.  Marland Kitchen gabapentin (NEURONTIN) 300 MG capsule Take 1 capsule (300 mg total) by mouth 3 (three) times daily. (Patient taking differently: Take 300 mg by mouth 3 (three) times daily. )  . gentamicin cream (GARAMYCIN) 0.1 % Apply 1 application topically 2 (two) times daily.  . hydrochlorothiazide (HYDRODIURIL) 25 MG tablet Take 0.5 tablets (12.5 mg total) by mouth daily.  . hydrOXYzine (VISTARIL) 25 MG capsule 25 mg.  . lisinopril (ZESTRIL) 5 MG tablet Take 1 tablet (5 mg total) by mouth daily.  . meloxicam (MOBIC) 7.5 MG tablet   . metFORMIN (GLUCOPHAGE) 1000 MG tablet Take 1 tablet (1,000 mg total) by mouth 2 (two) times daily with a meal.  . Multiple Vitamin (MULTIVITAMIN) tablet Take 1 tablet by mouth daily.  . mupirocin  ointment (BACTROBAN) 2 % Apply 1 application topically 2 (two) times daily. Up to 1-2 weeks as needed to prevent infection  . NEEDLE, DISP, 18 G (B-D DISP NEEDLE TW 18GX1") 18G X 1" MISC For use to draw up medication into the syringe  . NEEDLE, DISP, 21 G (BD SAFETYGLIDE SHIELDED NEEDLE) 21G X 1-1/2" MISC For use of administering medication in the IM location  . Omega-3 Fatty Acids (FISH OIL) 1200 MG CAPS Take 1 capsule (1,200 mg total) by mouth daily.  Marland Kitchen OZEMPIC, 1 MG/DOSE, 2 MG/1.5ML SOPN Inject 1 mg into the skin once a week.  . pantoprazole  (PROTONIX) 40 MG tablet Take 40 mg by mouth 2 (two) times daily.  . Syringe, Disposable, 3 ML MISC Use syringes as directed by physician  . testosterone cypionate (DEPOTESTOSTERONE CYPIONATE) 200 MG/ML injection INJECT 1 ML (200 MG TOTAL) INTO THE MUSCLE EVERY 14 (FOURTEEN) DAYS.  Marland Kitchen tiZANidine (ZANAFLEX) 2 MG tablet   . traMADol (ULTRAM) 50 MG tablet Take by mouth every 6 (six) hours as needed.  . TRUE METRIX BLOOD GLUCOSE TEST test strip Check blood sugar 2 times a day  . TRUEplus Lancets 33G MISC Check blood sugar 2 times a day  . zinc gluconate 50 MG tablet Take by mouth.   No facility-administered encounter medications on file as of 05/11/2020.    Goals Addressed            This Visit's Progress   . PharmD - Medication Assistance       CARE PLAN ENTRY (see longitudinal plan of care for additional care plan information)  Current Barriers:  . Financial Barriers in complicated patient with multiple medical conditions including T2DM, HTN, peripheral neuropathy, depression, GERD, tremors and chronic pain; patient has [employer] and reports copay for Ozempic is cost prohibitive at this time as he is currently in the coverage gap  Pharmacist Clinical Goal(s):  Marland Kitchen Over the next 30 days, patient will work with PharmD and providers to relieve medication access concerns  Interventions: . Review medication assistance options with Dale [name] o Based on reported income, patient not eligible for Extra Help Subsidy o Patient reports income is below/close to income limit for Patient Assistance Program for [place] through [employer] and would like to proceed with this application  . Will collaborate with [facility] CPhT to assist patient with applying for patient assistance for Ozempic through [place] with patient supporting documents that will be needed and provide patient with link to assistance program website so that he can review these documents/requirements with  spouse . Encourage patient to follow up with PCP regarding new or worsening medical concerns o Reports recently had lab work (result available through shared record with [facility]) and an abdominal ultrasound completed and wanted to let provider know for his review - patient will send a MyChart message to provider with questions/concerns o Reports recently changed psychiatrists due to health benefit coverage and is concerned that he will have a gap in refills for his medications prior to appointment with new provider.  - States that he will message PCP regarding this concern as well . Will keep scheduled appointment in order to follow up regarding medication assistance and complete medication review. . Will collaborate with PCP . Inter-disciplinary care team collaboration (see longitudinal plan of care)  Patient Self Care Activities:  . Patient will provide necessary portions of application   Initial goal documentation        Plan  Telephone follow up appointment with care management team member scheduled for: 7/14 at 9 am  Harlow Asa, PharmD, [place] [phone]

## 2020-05-11 NOTE — Patient Instructions (Signed)
Thank you allowing the Chronic Care Management Team to be a part of your care! It was a pleasure speaking with you today!     CCM (Chronic Care Management) Team    Noreene Larsson RN, MSN, CCM Nurse Care Coordinator  [phone]   Harlow Asa PharmD  Clinical Pharmacist  [phone]   Eula Fried LCSW Clinical Social Worker [phone]  Visit Information  Goals Addressed            This Visit's Progress   . PharmD - Medication Assistance       CARE PLAN ENTRY (see longitudinal plan of care for additional care plan information)  Current Barriers:  . Financial Barriers in complicated patient with multiple medical conditions including T2DM, HTN, peripheral neuropathy, depression, GERD, tremors and chronic pain; patient has [employer] and reports copay for Ozempic is cost prohibitive at this time as he is currently in the coverage gap  Pharmacist Clinical Goal(s):  Marland Kitchen Over the next 30 days, patient will work with PharmD and providers to relieve medication access concerns  Interventions: . Review medication assistance options with Mr. Wishart o Based on reported income, patient not eligible for Extra Help Subsidy o Patient reports income is below/close to income limit for Patient Assistance Program for [place] through [employer] and would like to proceed with this application  . Will collaborate with [facility] CPhT to assist patient with applying for patient assistance for Ozempic through [place] with patient supporting documents that will be needed and provide patient with link to assistance program website so that he can review these documents/requirements with spouse . Encourage patient to follow up with PCP regarding new or worsening medical concerns o Reports recently had lab work (result available through shared record with [facility]) and an abdominal ultrasound completed and wanted to let provider know for his review - patient will send  a MyChart message to provider with questions/concerns o Reports recently changed psychiatrists due to health benefit coverage and is concerned that he will have a gap in refills for his medications prior to appointment with new provider.  - States that he will message PCP regarding this concern as well . Will keep scheduled appointment in order to follow up regarding medication assistance and complete medication review. . Will collaborate with PCP . Inter-disciplinary care team collaboration (see longitudinal plan of care)  Patient Self Care Activities:  . Patient will provide necessary portions of application   Initial goal documentation        Patient verbalizes understanding of instructions provided today.   Telephone follow up appointment with care management team member scheduled for: 7/14 at 9 am  Harlow Asa, PharmD, [place] [phone]

## 2020-05-12 ENCOUNTER — Other Ambulatory Visit: Payer: Self-pay | Admitting: Pharmacy Technician

## 2020-05-12 NOTE — Patient Outreach (Signed)
[facility]) Care Management  05/12/2020  Jaman Aro 1961-11-02 [id number]                                      Medication Assistance Referral  Referral From: [facility] Embedded RPh Dorthula Perfect   Medication/Company: Larna Daughters / Novo [employer] Patient application portion:  Mailed Provider application portion: Faxed  to Dr. [name] Provider address/fax verified via: Office website     Follow up:  Will follow up with patient in 5-15 business days to confirm application(s) have been received.  Josemaria Brining P. Okema Rollinson, [place]  [phone]

## 2020-05-17 ENCOUNTER — Ambulatory Visit: Payer: Medicare HMO | Admitting: Pharmacist

## 2020-05-17 DIAGNOSIS — E1140 Type 2 diabetes mellitus with diabetic neuropathy, unspecified: Secondary | ICD-10-CM

## 2020-05-17 NOTE — Chronic Care Management (AMB) (Signed)
Chronic Care Management   Follow Up Note   05/17/2020 Name: Dale [name]. MRN: [id number] DOB: 1961-10-06  Referred by: Olin Hauser, DO Reason for referral : No chief complaint on file.   Dale [name]. is a 59 y.o. year old male who is a primary care patient of Olin Hauser, DO. The CCM team was consulted for assistance with chronic disease management and care coordination needs.    I reached out to [name] by phone today.   Review of patient status, including review of consultants reports, relevant laboratory and other test results, and collaboration with appropriate care team members and the patient's provider was performed as part of comprehensive patient evaluation and provision of chronic care management services.      Outpatient Encounter Medications as of 05/17/2020  Medication Sig  . acetaminophen (TYLENOL) 500 MG tablet Take by mouth.  . Alcohol Swabs (B-D SINGLE USE SWABS REGULAR) PADS Check blood sugar 2 times daily.  Marland Kitchen ALPRAZolam (XANAX) 1 MG tablet Take 1 mg by mouth 3 (three) times daily.  Marland Kitchen amLODipine (NORVASC) 5 MG tablet Take 1 tablet (5 mg total) by mouth daily.  Marland Kitchen amphetamine-dextroamphetamine (ADDERALL XR) 10 MG 24 hr capsule Take 10 mg by mouth daily.  Marland Kitchen aspirin EC 81 MG tablet Take 81 mg by mouth daily.  Marland Kitchen atorvastatin (LIPITOR) 10 MG tablet TAKE 1 TABLET BY MOUTH DAILY AT 2 PM  . b complex vitamins capsule Take by mouth.  . Blood Glucose Calibration (TRUE METRIX LEVEL 1) Low SOLN Check blood sugar 2 times daily  . Blood Glucose Monitoring Suppl (TRUE METRIX AIR GLUCOSE METER) w/Device KIT Check blood sugar 2 times daily  . buPROPion (WELLBUTRIN XL) 300 MG 24 hr tablet Take 1 tablet (300 mg total) by mouth daily.  . cholecalciferol (VITAMIN D) 400 units TABS tablet Take 400 Units by mouth.  . co-enzyme Q-10 30 MG capsule Take 30 mg by mouth 3 (three) times daily.  Marland Kitchen desoximetasone (TOPICORT) 0.25 % cream Apply 1  application topically 2 (two) times daily as needed.  . diclofenac sodium (VOLTAREN) 1 % GEL Apply topically.  . dicyclomine (BENTYL) 20 MG tablet TAKE 1 TABLET (20 MG TOTAL) BY MOUTH EVERY 6 (SIX) HOURS  . DULoxetine (CYMBALTA) 30 MG capsule Take 1 capsule (30 mg total) by mouth daily. Take with existing 78m dose = total daily dose 963m . DULoxetine (CYMBALTA) 60 MG capsule Take 1 capsule (60 mg total) by mouth daily. Take with existing 3048mose = total daily dose 53m49m fluticasone (FLONASE) 50 MCG/ACT nasal spray Place 2 sprays into both nostrils daily.  . gaMarland Kitchenapentin (NEURONTIN) 300 MG capsule Take 1 capsule (300 mg total) by mouth 3 (three) times daily. (Patient taking differently: Take 300 mg by mouth 3 (three) times daily. )  . gentamicin cream (GARAMYCIN) 0.1 % Apply 1 application topically 2 (two) times daily.  . hydrochlorothiazide (HYDRODIURIL) 25 MG tablet Take 0.5 tablets (12.5 mg total) by mouth daily.  . hydrOXYzine (VISTARIL) 25 MG capsule 25 mg.  . lisinopril (ZESTRIL) 5 MG tablet Take 1 tablet (5 mg total) by mouth daily.  . meloxicam (MOBIC) 7.5 MG tablet   . metFORMIN (GLUCOPHAGE) 1000 MG tablet Take 1 tablet (1,000 mg total) by mouth 2 (two) times daily with a meal.  . Multiple Vitamin (MULTIVITAMIN) tablet Take 1 tablet by mouth daily.  . mupirocin ointment (BACTROBAN) 2 % Apply 1 application topically 2 (two)  times daily. Up to 1-2 weeks as needed to prevent infection  . NEEDLE, DISP, 18 G (B-D DISP NEEDLE TW 18GX1") 18G X 1" MISC For use to draw up medication into the syringe  . NEEDLE, DISP, 21 G (BD SAFETYGLIDE SHIELDED NEEDLE) 21G X 1-1/2" MISC For use of administering medication in the IM location  . Omega-3 Fatty Acids (FISH OIL) 1200 MG CAPS Take 1 capsule (1,200 mg total) by mouth daily.  Marland Kitchen OZEMPIC, 1 MG/DOSE, 2 MG/1.5ML SOPN Inject 1 mg into the skin once a week.  . pantoprazole (PROTONIX) 40 MG tablet Take 40 mg by mouth 2 (two) times daily.  . Syringe,  Disposable, 3 ML MISC Use syringes as directed by physician  . testosterone cypionate (DEPOTESTOSTERONE CYPIONATE) 200 MG/ML injection INJECT 1 ML (200 MG TOTAL) INTO THE MUSCLE EVERY 14 (FOURTEEN) DAYS.  Marland Kitchen tiZANidine (ZANAFLEX) 2 MG tablet   . traMADol (ULTRAM) 50 MG tablet Take by mouth every 6 (six) hours as needed.  . TRUE METRIX BLOOD GLUCOSE TEST test strip Check blood sugar 2 times a day  . TRUEplus Lancets 33G MISC Check blood sugar 2 times a day  . zinc gluconate 50 MG tablet Take by mouth.   No facility-administered encounter medications on file as of 05/17/2020.    Goals Addressed            This Visit's Progress   . PharmD - Medication Assistance       CARE PLAN ENTRY (see longitudinal plan of care for additional care plan information)  Current Barriers:  . Financial Barriers in complicated patient with multiple medical conditions including T2DM, HTN, peripheral neuropathy, depression, GERD, tremors and chronic pain; patient has [employer] and reports copay for Ozempic is cost prohibitive at this time as he is currently in the coverage gap  Pharmacist Clinical Goal(s):  Marland Kitchen Over the next 30 days, patient will work with PharmD and providers to relieve medication access concerns  Interventions: . Collaborated with PCP . Follow up with patient to complete medication review - patient states unable to complete this today; reschedule as requested . Follow up regarding medication assistance o Note [facility] CPhT mailed application for patient assistance for Ozempic through [employer] to patient on 7/9.  o Reports that he and his wife have gathered the financial supporting documents needed for the applicaiton . Again encourage patient to follow up with PCP. Note patient previously mentioned recent change in psychiatrists and concern that he would have a gap in refills for his medications prior to appointment with new provider.  - States that he will send message to PCP  today . Inter-disciplinary care team collaboration (see longitudinal plan of care)  Patient Self Care Activities:  . Patient will provide necessary portions of application   Please see past updates related to this goal by clicking on the "Past Updates" button in the selected goal         Plan  Telephone follow up appointment with care management team member scheduled for: 8/9 at 9 am  Harlow Asa, PharmD, [place] [phone]

## 2020-05-17 NOTE — Patient Instructions (Signed)
Thank you allowing the Chronic Care Management Team to be a part of your care! It was a pleasure speaking with you today!     CCM (Chronic Care Management) Team    Noreene Larsson RN, MSN, CCM Nurse Care Coordinator  [phone]   Harlow Asa PharmD  Clinical Pharmacist  [phone]   Eula Fried LCSW Clinical Social Worker [phone]  Visit Information  Goals Addressed            This Visit's Progress    PharmD - Medication Assistance       CARE PLAN ENTRY (see longitudinal plan of care for additional care plan information)  Current Barriers:   Financial Barriers in complicated patient with multiple medical conditions including T2DM, HTN, peripheral neuropathy, depression, GERD, tremors and chronic pain; patient has [employer] and reports copay for Ozempic is cost prohibitive at this time as he is currently in the coverage gap  Pharmacist Clinical Goal(s):   Over the next 30 days, patient will work with PharmD and providers to relieve medication access concerns  Interventions:  Collaborated with PCP  Follow up with patient to complete medication review - patient states unable to complete this today; reschedule as requested  Follow up regarding medication assistance o Note [facility] CPhT mailed application for patient assistance for Ozempic through [employer] to patient on 7/9.  o Reports that he and his wife have gathered the financial supporting documents needed for the applicaiton  Again encourage patient to follow up with PCP. Note patient previously mentioned recent change in psychiatrists and concern that he would have a gap in refills for his medications prior to appointment with new provider.  - States that he will send message to PCP today  Inter-disciplinary care team collaboration (see longitudinal plan of care)  Patient Self Care Activities:   Patient will provide necessary portions of application   Please see past updates  related to this goal by clicking on the "Past Updates" button in the selected goal         Patient verbalizes understanding of instructions provided today.   Telephone follow up appointment with care management team member scheduled for: 8/9 at 40 am  Harlow Asa, PharmD, [place] [phone]

## 2020-05-24 ENCOUNTER — Other Ambulatory Visit: Payer: Self-pay | Admitting: Pharmacy Technician

## 2020-05-24 NOTE — Patient Outreach (Signed)
[facility]) Care Management  05/24/2020  Rushi Chasen. [birth year] [id number]   Unsuccessful call placed to patient regarding patient assistance application(s) for Ozempic with [employer] , HIPAA compliant voicemail left.   Unfortunately patient did not answer the phone. Was calling to inquire if he received the application that was mailed to him on 05/12/2020.  Follow up:  Will follow up in 3-7 business days if call is not returned.  Jean Alejos P. Danyelle Brookover, [place]  [phone]

## 2020-05-29 ENCOUNTER — Ambulatory Visit: Payer: Self-pay | Admitting: Pharmacist

## 2020-05-29 ENCOUNTER — Other Ambulatory Visit: Payer: Self-pay | Admitting: Urology

## 2020-05-29 ENCOUNTER — Other Ambulatory Visit: Payer: Self-pay | Admitting: Pharmacy Technician

## 2020-05-29 DIAGNOSIS — E1140 Type 2 diabetes mellitus with diabetic neuropathy, unspecified: Secondary | ICD-10-CM

## 2020-05-29 NOTE — Patient Outreach (Signed)
[facility]) Care Management  05/29/2020  Zylan Almquist. 1961-07-11 [id number]    Unsuccessful call placed to patient regarding patient assistance application(s) for Ozempic with [employer] , HIPAA compliant voicemail left.   Second unsuccessful call.  Was calling to inquire if patient had received the application that was mailed to him on 05/12/2020.  Follow up:  Will route note to embedded [facility] RPh Harlow Asa for case closure since unable to get in contact with patient and messages have not been returned.  Will gladly reopen the case if the application is received back.  Jezlyn Westerfield P. Jeovany Huitron, [place]  [phone]

## 2020-05-29 NOTE — Chronic Care Management (AMB) (Signed)
Chronic Care Management   Follow Up Note   05/29/2020 Name: Dale [name]. MRN: [id number] DOB: 1961-03-28  Referred by: Dale Hauser, DO Reason for referral : Chronic Care Management (Patient Phone Call)   Dale [name]. is a 59 y.o. year old male who is a primary care patient of Dale Hauser, DO. The CCM team was consulted for assistance with chronic disease management and care coordination needs.    Receive coordination of care message from Dale [name].  I reached out to Dale [name]. by phone today.   Review of patient status, including review of consultants reports, relevant laboratory and other test results, and collaboration with appropriate care team members and the patient's provider was performed as part of comprehensive patient evaluation and provision of chronic care management services.     Outpatient Encounter Medications as of 05/29/2020  Medication Sig  . acetaminophen (TYLENOL) 500 MG tablet Take by mouth.  . Alcohol Swabs (B-D SINGLE USE SWABS REGULAR) PADS Check blood sugar 2 times daily.  Marland Kitchen ALPRAZolam (XANAX) 1 MG tablet Take 1 mg by mouth 3 (three) times daily.  Marland Kitchen amLODipine (NORVASC) 5 MG tablet Take 1 tablet (5 mg total) by mouth daily.  Marland Kitchen amphetamine-dextroamphetamine (ADDERALL XR) 10 MG 24 hr capsule Take 10 mg by mouth daily.  Marland Kitchen aspirin EC 81 MG tablet Take 81 mg by mouth daily.  Marland Kitchen atorvastatin (LIPITOR) 10 MG tablet TAKE 1 TABLET BY MOUTH DAILY AT 2 PM  . b complex vitamins capsule Take by mouth.  . Blood Glucose Calibration (TRUE METRIX LEVEL 1) Low SOLN Check blood sugar 2 times daily  . Blood Glucose Monitoring Suppl (TRUE METRIX AIR GLUCOSE METER) w/Device KIT Check blood sugar 2 times daily  . buPROPion (WELLBUTRIN XL) 300 MG 24 hr tablet Take 1 tablet (300 mg total) by mouth daily.  . cholecalciferol (VITAMIN D) 400 units TABS tablet Take 400 Units by mouth.  . co-enzyme Q-10 30 MG capsule Take 30 mg by mouth 3  (three) times daily.  Marland Kitchen desoximetasone (TOPICORT) 0.25 % cream Apply 1 application topically 2 (two) times daily as needed.  . diclofenac sodium (VOLTAREN) 1 % GEL Apply topically.  . dicyclomine (BENTYL) 20 MG tablet TAKE 1 TABLET (20 MG TOTAL) BY MOUTH EVERY 6 (SIX) HOURS  . DULoxetine (CYMBALTA) 30 MG capsule Take 1 capsule (30 mg total) by mouth daily. Take with existing 59m dose = total daily dose 987m . DULoxetine (CYMBALTA) 60 MG capsule Take 1 capsule (60 mg total) by mouth daily. Take with existing 309mose = total daily dose 4m48m fluticasone (FLONASE) 50 MCG/ACT nasal spray Place 2 sprays into both nostrils daily.  . gaMarland Kitchenapentin (NEURONTIN) 300 MG capsule Take 1 capsule (300 mg total) by mouth 3 (three) times daily. (Patient taking differently: Take 300 mg by mouth 3 (three) times daily. )  . gentamicin cream (GARAMYCIN) 0.1 % Apply 1 application topically 2 (two) times daily.  . hydrochlorothiazide (HYDRODIURIL) 25 MG tablet Take 0.5 tablets (12.5 mg total) by mouth daily.  . hydrOXYzine (VISTARIL) 25 MG capsule 25 mg.  . lisinopril (ZESTRIL) 5 MG tablet Take 1 tablet (5 mg total) by mouth daily.  . meloxicam (MOBIC) 7.5 MG tablet   . metFORMIN (GLUCOPHAGE) 1000 MG tablet Take 1 tablet (1,000 mg total) by mouth 2 (two) times daily with a meal.  . Multiple Vitamin (MULTIVITAMIN) tablet Take 1 tablet by mouth daily.  . mupirocin ointment (  BACTROBAN) 2 % Apply 1 application topically 2 (two) times daily. Up to 1-2 weeks as needed to prevent infection  . NEEDLE, DISP, 18 G (B-D DISP NEEDLE TW 18GX1") 18G X 1" MISC For use to draw up medication into the syringe  . NEEDLE, DISP, 21 G (BD SAFETYGLIDE SHIELDED NEEDLE) 21G X 1-1/2" MISC For use of administering medication in the IM location  . Omega-3 Fatty Acids (FISH OIL) 1200 MG CAPS Take 1 capsule (1,200 mg total) by mouth daily.  Marland Kitchen OZEMPIC, 1 MG/DOSE, 2 MG/1.5ML SOPN Inject 1 mg into the skin once a week.  . pantoprazole (PROTONIX) 40  MG tablet Take 40 mg by mouth 2 (two) times daily.  . Syringe, Disposable, 3 ML MISC Use syringes as directed by physician  . testosterone cypionate (DEPOTESTOSTERONE CYPIONATE) 200 MG/ML injection INJECT 1 ML (200 MG TOTAL) INTO THE MUSCLE EVERY 14 (FOURTEEN) DAYS.  Marland Kitchen tiZANidine (ZANAFLEX) 2 MG tablet   . traMADol (ULTRAM) 50 MG tablet Take by mouth every 6 (six) hours as needed.  . TRUE METRIX BLOOD GLUCOSE TEST test strip Check blood sugar 2 times a day  . TRUEplus Lancets 33G MISC Check blood sugar 2 times a day  . zinc gluconate 50 MG tablet Take by mouth.   No facility-administered encounter medications on file as of 05/29/2020.    Goals Addressed            This Visit's Progress   . PharmD - Medication Assistance       CARE PLAN ENTRY (see longitudinal plan of care for additional care plan information)  Current Barriers:  . Financial Barriers in complicated patient with multiple medical conditions including T2DM, HTN, peripheral neuropathy, depression, GERD, tremors and chronic pain; patient has [employer] and reports copay for Ozempic is cost prohibitive at this time as he is currently in the coverage gap  Pharmacist Clinical Goal(s):  Marland Kitchen Over the next 30 days, patient will work with PharmD and providers to relieve medication access concerns  Interventions: . Receive InBasket message from Dale [name] letting me know that she has been unable to reach patient to follow up regarding [employer] assistance application . Follow up regarding medication assistance  o Reports that he and his wife have gathered the financial supporting documents needed for the application and that he is planning to complete filling this out tomorrow to return to [place] - However, reports from reviewing these income documents, his household income slightly exceeds program limit o Counsel patient on paperwork needed for appeal for [employer] assistance program, including medical expense  reports from both [facility] and [place], copy of both sides of insurance card and letter of hardship. . Send patient a sample Letter of Hardship as requested via [place] . Inter-disciplinary care team collaboration (see longitudinal plan of care)  Patient Self Care Activities:  . Patient will provide necessary portions of application   Please see past updates related to this goal by clicking on the "Past Updates" button in the selected goal         Plan  Telephone follow up appointment with care management team member scheduled for: 8/9 at 9 am  Harlow Asa, PharmD, [place] [phone]

## 2020-05-29 NOTE — Patient Instructions (Signed)
Thank you allowing the Chronic Care Management Team to be a part of your care! It was a pleasure speaking with you today!     CCM (Chronic Care Management) Team    Noreene Larsson RN, MSN, CCM Nurse Care Coordinator  [phone]   Harlow Asa PharmD  Clinical Pharmacist  [phone]   Eula Fried LCSW Clinical Social Worker [phone]  Visit Information  Goals Addressed            This Visit's Progress   . PharmD - Medication Assistance       CARE PLAN ENTRY (see longitudinal plan of care for additional care plan information)  Current Barriers:  . Financial Barriers in complicated patient with multiple medical conditions including T2DM, HTN, peripheral neuropathy, depression, GERD, tremors and chronic pain; patient has [employer] and reports copay for Ozempic is cost prohibitive at this time as he is currently in the coverage gap  Pharmacist Clinical Goal(s):  Marland Kitchen Over the next 30 days, patient will work with PharmD and providers to relieve medication access concerns  Interventions: . Receive InBasket message from [place] letting me know that she has been unable to reach patient to follow up regarding [employer] assistance application . Follow up regarding medication assistance  o Reports that he and his wife have gathered the financial supporting documents needed for the application and that he is planning to complete filling this out tomorrow to return to [place] - However, reports from reviewing these income documents, his household income slightly exceeds program limit o Counsel patient on paperwork needed for appeal for [employer] assistance program, including medical expense reports from both [facility] and [place], copy of both sides of insurance card and letter of hardship. . Send patient a sample Letter of Hardship as requested via [place] . Inter-disciplinary care team collaboration (see longitudinal plan of care)  Patient Self Care Activities:   . Patient will provide necessary portions of application   Please see past updates related to this goal by clicking on the "Past Updates" button in the selected goal         Patient verbalizes understanding of instructions provided today.   Telephone follow up appointment with care management team member scheduled for: 8/9 at 17 am  Harlow Asa, PharmD, [place] [phone]

## 2020-05-30 DIAGNOSIS — M792 Neuralgia and neuritis, unspecified: Secondary | ICD-10-CM | POA: Diagnosis not present

## 2020-05-30 DIAGNOSIS — R200 Anesthesia of skin: Secondary | ICD-10-CM | POA: Diagnosis not present

## 2020-05-30 DIAGNOSIS — E538 Deficiency of other specified B group vitamins: Secondary | ICD-10-CM | POA: Diagnosis not present

## 2020-05-30 DIAGNOSIS — R202 Paresthesia of skin: Secondary | ICD-10-CM | POA: Diagnosis not present

## 2020-05-30 DIAGNOSIS — G250 Essential tremor: Secondary | ICD-10-CM | POA: Diagnosis not present

## 2020-05-30 DIAGNOSIS — R4189 Other symptoms and signs involving cognitive functions and awareness: Secondary | ICD-10-CM | POA: Diagnosis not present

## 2020-05-30 DIAGNOSIS — E519 Thiamine deficiency, unspecified: Secondary | ICD-10-CM | POA: Diagnosis not present

## 2020-06-01 DIAGNOSIS — R200 Anesthesia of skin: Secondary | ICD-10-CM | POA: Diagnosis not present

## 2020-06-01 DIAGNOSIS — E519 Thiamine deficiency, unspecified: Secondary | ICD-10-CM | POA: Diagnosis not present

## 2020-06-01 DIAGNOSIS — R4189 Other symptoms and signs involving cognitive functions and awareness: Secondary | ICD-10-CM | POA: Diagnosis not present

## 2020-06-01 DIAGNOSIS — E538 Deficiency of other specified B group vitamins: Secondary | ICD-10-CM | POA: Diagnosis not present

## 2020-06-07 ENCOUNTER — Ambulatory Visit: Payer: Self-pay | Admitting: Pharmacist

## 2020-06-07 DIAGNOSIS — E1140 Type 2 diabetes mellitus with diabetic neuropathy, unspecified: Secondary | ICD-10-CM

## 2020-06-07 NOTE — Patient Instructions (Signed)
Thank you allowing the Chronic Care Management Team to be a part of your care! It was a pleasure speaking with you today!     CCM (Chronic Care Management) Team    Noreene Larsson RN, MSN, CCM Nurse Care Coordinator  [phone]   Harlow Asa PharmD  Clinical Pharmacist  [phone]   Eula Fried LCSW Clinical Social Worker [phone]  Visit Information  Goals Addressed            This Visit's Progress    PharmD - Medication Assistance       CARE PLAN ENTRY (see longitudinal plan of care for additional care plan information)  Current Barriers:   Financial Barriers in complicated patient with multiple medical conditions including T2DM, HTN, peripheral neuropathy, depression, GERD, tremors and chronic pain; patient has [employer] and reports copay for Ozempic is cost prohibitive at this time as he is currently in the coverage gap  Pharmacist Clinical Goal(s):   Over the next 30 days, patient will work with PharmD and providers to relieve medication access concerns  Interventions:  Receive message from patient requesting follow up regarding [employer] assistance application - patient received sample Letter of Hardship sent via [place], but requests resent via [place] patient a sample Letter of Hardship as requested via [place] care team collaboration (see longitudinal plan of care)  Patient Self Care Activities:   Patient will provide necessary portions of application   Please see past updates related to this goal by clicking on the "Past Updates" button in the selected goal         Patient verbalizes understanding of instructions provided today.   Telephone follow up appointment with care management team member scheduled for: 8/9 at 26 am  Harlow Asa, PharmD, [place] [phone]

## 2020-06-07 NOTE — Chronic Care Management (AMB) (Signed)
Chronic Care Management   Follow Up Note   06/07/2020 Name: Dale [name]. MRN: [id number] DOB: [birth year]  Referred by: Olin Hauser, DO Reason for referral : Chronic Care Management (Patient Phone Call)   Dale [name]. is a 59 y.o. year old male who is a primary care patient of Olin Hauser, DO. The CCM team was consulted for assistance with chronic disease management and care coordination needs.    Receive message from patient regarding assistance application.  I reached out to [name] by phone today.   Review of patient status, including review of consultants reports, relevant laboratory and other test results, and collaboration with appropriate care team members and the patient's provider was performed as part of comprehensive patient evaluation and provision of chronic care management services.     Outpatient Encounter Medications as of 06/07/2020  Medication Sig  . acetaminophen (TYLENOL) 500 MG tablet Take by mouth.  . Alcohol Swabs (B-D SINGLE USE SWABS REGULAR) PADS Check blood sugar 2 times daily.  Marland Kitchen ALPRAZolam (XANAX) 1 MG tablet Take 1 mg by mouth 3 (three) times daily.  Marland Kitchen amLODipine (NORVASC) 5 MG tablet Take 1 tablet (5 mg total) by mouth daily.  Marland Kitchen amphetamine-dextroamphetamine (ADDERALL XR) 10 MG 24 hr capsule Take 10 mg by mouth daily.  Marland Kitchen aspirin EC 81 MG tablet Take 81 mg by mouth daily.  Marland Kitchen atorvastatin (LIPITOR) 10 MG tablet TAKE 1 TABLET BY MOUTH DAILY AT 2 PM  . b complex vitamins capsule Take by mouth.  . Blood Glucose Calibration (TRUE METRIX LEVEL 1) Low SOLN Check blood sugar 2 times daily  . Blood Glucose Monitoring Suppl (TRUE METRIX AIR GLUCOSE METER) w/Device KIT Check blood sugar 2 times daily  . buPROPion (WELLBUTRIN XL) 300 MG 24 hr tablet Take 1 tablet (300 mg total) by mouth daily.  . cholecalciferol (VITAMIN D) 400 units TABS tablet Take 400 Units by mouth.  . co-enzyme Q-10 30 MG capsule Take 30 mg by  mouth 3 (three) times daily.  Marland Kitchen desoximetasone (TOPICORT) 0.25 % cream Apply 1 application topically 2 (two) times daily as needed.  . diclofenac sodium (VOLTAREN) 1 % GEL Apply topically.  . dicyclomine (BENTYL) 20 MG tablet TAKE 1 TABLET (20 MG TOTAL) BY MOUTH EVERY 6 (SIX) HOURS  . DULoxetine (CYMBALTA) 30 MG capsule Take 1 capsule (30 mg total) by mouth daily. Take with existing 28m dose = total daily dose 976m . DULoxetine (CYMBALTA) 60 MG capsule Take 1 capsule (60 mg total) by mouth daily. Take with existing 3045mose = total daily dose 49m22m fluticasone (FLONASE) 50 MCG/ACT nasal spray Place 2 sprays into both nostrils daily.  . gaMarland Kitchenapentin (NEURONTIN) 300 MG capsule Take 1 capsule (300 mg total) by mouth 3 (three) times daily. (Patient taking differently: Take 300 mg by mouth 3 (three) times daily. )  . gentamicin cream (GARAMYCIN) 0.1 % Apply 1 application topically 2 (two) times daily.  . hydrochlorothiazide (HYDRODIURIL) 25 MG tablet Take 0.5 tablets (12.5 mg total) by mouth daily.  . hydrOXYzine (VISTARIL) 25 MG capsule 25 mg.  . lisinopril (ZESTRIL) 5 MG tablet Take 1 tablet (5 mg total) by mouth daily.  . meloxicam (MOBIC) 7.5 MG tablet   . metFORMIN (GLUCOPHAGE) 1000 MG tablet Take 1 tablet (1,000 mg total) by mouth 2 (two) times daily with a meal.  . Multiple Vitamin (MULTIVITAMIN) tablet Take 1 tablet by mouth daily.  . mupirocin ointment (BACTROBAN)  2 % Apply 1 application topically 2 (two) times daily. Up to 1-2 weeks as needed to prevent infection  . NEEDLE, DISP, 18 G (B-D DISP NEEDLE TW 18GX1") 18G X 1" MISC For use to draw up medication into the syringe  . NEEDLE, DISP, 21 G (BD SAFETYGLIDE SHIELDED NEEDLE) 21G X 1-1/2" MISC For use of administering medication in the IM location  . Omega-3 Fatty Acids (FISH OIL) 1200 MG CAPS Take 1 capsule (1,200 mg total) by mouth daily.  Marland Kitchen OZEMPIC, 1 MG/DOSE, 2 MG/1.5ML SOPN Inject 1 mg into the skin once a week.  . pantoprazole  (PROTONIX) 40 MG tablet Take 40 mg by mouth 2 (two) times daily.  . Syringe, Disposable, 3 ML MISC Use syringes as directed by physician  . testosterone cypionate (DEPOTESTOSTERONE CYPIONATE) 200 MG/ML injection INJECT 1 ML (200 MG TOTAL) INTO THE MUSCLE EVERY 14 (FOURTEEN) DAYS.  Marland Kitchen tiZANidine (ZANAFLEX) 2 MG tablet   . traMADol (ULTRAM) 50 MG tablet Take by mouth every 6 (six) hours as needed.  . TRUE METRIX BLOOD GLUCOSE TEST test strip Check blood sugar 2 times a day  . TRUEplus Lancets 33G MISC Check blood sugar 2 times a day  . zinc gluconate 50 MG tablet Take by mouth.   No facility-administered encounter medications on file as of 06/07/2020.    Goals Addressed            This Visit's Progress   . PharmD - Medication Assistance       CARE PLAN ENTRY (see longitudinal plan of care for additional care plan information)  Current Barriers:  . Financial Barriers in complicated patient with multiple medical conditions including T2DM, HTN, peripheral neuropathy, depression, GERD, tremors and chronic pain; patient has [employer] and reports copay for Ozempic is cost prohibitive at this time as he is currently in the coverage gap  Pharmacist Clinical Goal(s):  Marland Kitchen Over the next 30 days, patient will work with PharmD and providers to relieve medication access concerns  Interventions: . Receive message from patient requesting follow up regarding [employer] assistance application - patient received sample Letter of Hardship sent via [employer], but requests resent via Secure Email  . Send patient a sample Letter of Hardship as requested via Secure Email . Inter-disciplinary care team collaboration (see longitudinal plan of care)  Patient Self Care Activities:  . Patient will provide necessary portions of application   Please see past updates related to this goal by clicking on the "Past Updates" button in the selected goal         Plan  Telephone follow up appointment  with care management team member scheduled for: 8/9 at 9 am  Harlow Asa, PharmD, [place] [phone]

## 2020-06-12 ENCOUNTER — Ambulatory Visit (INDEPENDENT_AMBULATORY_CARE_PROVIDER_SITE_OTHER): Payer: Medicare HMO | Admitting: Pharmacist

## 2020-06-12 ENCOUNTER — Other Ambulatory Visit: Payer: Self-pay | Admitting: Family Medicine

## 2020-06-12 DIAGNOSIS — I10 Essential (primary) hypertension: Secondary | ICD-10-CM | POA: Diagnosis not present

## 2020-06-12 DIAGNOSIS — E1140 Type 2 diabetes mellitus with diabetic neuropathy, unspecified: Secondary | ICD-10-CM

## 2020-06-12 NOTE — Patient Instructions (Signed)
Thank you allowing the Chronic Care Management Team to be a part of your care! It was a pleasure speaking with you today!     CCM (Chronic Care Management) Team    Noreene Larsson RN, MSN, CCM Nurse Care Coordinator  [phone]   Harlow Asa PharmD  Clinical Pharmacist  [phone]   Eula Fried LCSW Clinical Social Worker [phone]  Visit Information  Goals Addressed            This Visit's Progress   . PharmD - Medication Assistance       CARE PLAN ENTRY (see longitudinal plan of care for additional care plan information)  Current Barriers:  . Financial Barriers in complicated patient with multiple medical conditions including T2DM, HTN, peripheral neuropathy, depression, GERD, tremors and chronic pain; patient has [employer] and reports copay for Ozempic is cost prohibitive at this time as he is currently in the coverage gap  Pharmacist Clinical Goal(s):  Marland Kitchen Over the next 30 days, patient will work with PharmD and providers to relieve medication access concerns  Interventions: . Follow up with patient regarding medication assistance. Reports has completed letter of financial hardship and will return this along with [employer] patient assistance application and other supporting documents to [employer] [facility] via mail. . Comprehensive medication review performed; medication list updated in electronic medical record o Caution for increased risk of dizziness/sedation with alprazolam, gabapentin, tizanidine and tramadol, particularly when taken in combination - Mr. Tanzi verbalizes understanding. Denies any current dizziness or sedation with these medications o Note patient currently using both oral NSAID (meloxicam) and topical NSAID (diclofenac gel), in addition to aspirin 81 mg daily - Counsel on increased GI risks with using topical NSAID with oral NSAID - Reports using diclofenac gel only as needed. Note patient using meloxicam and diclofenac gel as  directed by [facility] provider - Note patient taking PPI, pantoprazole twice daily - Reports taking meloxicam doses with meals.  o Reports started taking propranolol ER 60 mg daily as directed by Neurologist for tremors and has stopped taking amlodipine as directed with start of propranolol. From review of chart, note change made by Dr. Manuella Ghazi at [facility] Visit on 7/27. o Note patient reports taking HCTZ 25 mg - 1 full tablet once daily, rather than  tablet daily - From review of chart, on 3/17 PCP advised patient to reduce HCTZ dose to 12.5 mg daily as he was advising patient to resume other HTN agent. . Patient denies having reduced HCTZ dose at that time due to difficulty with splitting the tablets. o Reports taking ceylon cinnamon supplement for diabetes.  - Note that based on available data, the Natural Medicines Database states "most research shows that oral Ceylon cinnamon does not benefit patients with type 2 diabetes". - Caution patient about limited data with supplements, as unlike prescription medications, these do not undergo FDA approval and there may be insufficient data to confirm safety, efficacy or potential for interactions. Encourage when taking supplements, to select USP verified products, when available. Orpah Greek on importance of blood pressure control and monitoring o Reports having home upper arm BP monitor, but denies recently monitoring home BP. Denies symptoms such as dizziness or lightheadedness o Counsel on rational for home BP monitoring, particularly following a change in medication regimen. o Counsel patient to restart home BP monitoring, keep log of results and to contact office if persistently >140/90 or new symptoms  o Counsel on blood pressure monitoring technique .  Follow up regarding blood sugar control and monitoring o Reports taking: - Metformin 1000 mg twice daily - Ozempic 0.5 mg once weekly (reports using 0.5 mg weekly dose as provided with  these samples from office) . Encourage patient to continue to follow up with office regarding availability of samples while continue to apply for assistance from [employer] o Reports recent morning fasting CBGs running ~150s o Counsel on importance of nutrition and exercise in blood sugar control . Will mail patient BP log as well as handout on BP monitoring technique as requested. . Coordination of care. Patient reports has appointment with new psychiatrist tomorrow. . Will collaborate with PCP . Inter-disciplinary care team collaboration (see longitudinal plan of care)  Patient Self Care Activities:  . Patient will provide necessary portions of application   Please see past updates related to this goal by clicking on the "Past Updates" button in the selected goal         Patient verbalizes understanding of instructions provided today.   Telephone follow up appointment with care management team member scheduled for: 9/1 at 9:45 am  Harlow Asa, PharmD, [place] [phone]

## 2020-06-12 NOTE — Chronic Care Management (AMB) (Signed)
Chronic Care Management   Note  06/12/2020 Name: Dale [name]. MRN: [id number] DOB: 08-11-1961   Subjective:   Dale [name]. is a 59 y.o. year old male who is a primary care patient of Olin Hauser, DO. The CM team was consulted for assistance with chronic disease management and care coordination.   I reached out to Dale [name]. by phone today.   Review of patient status, including review of consultants reports, laboratory and other test data, was performed as part of comprehensive evaluation and provision of chronic care management services.   Objective:  Lab Results  Component Value Date   CREATININE 1.07 07/13/2019   CREATININE 0.80 07/08/2018   CREATININE 0.64 03/09/2018    Lab Results  Component Value Date   HGBA1C 5.6 01/19/2020       Component Value Date/Time   CHOL 113 07/13/2019 0810   CHOL 104 06/26/2015 1126   TRIG 184 (H) 07/13/2019 0810   HDL 32 (L) 07/13/2019 0810   HDL 30 (L) 06/26/2015 1126   CHOLHDL 3.5 07/13/2019 0810   VLDL 52 (H) 07/18/2016 1041   LDLCALC 55 07/13/2019 0810     BP Readings from Last 3 Encounters:  01/19/20 134/84  07/20/19 122/70  06/28/19 135/85    Allergies  Allergen Reactions  . Benzoin Other (See Comments)    blisters  . Butrans [Buprenorphine]     Severe Burn pain patch, blisters  . Doxycycline Hyclate Nausea Only  . Morphine Other (See Comments)  . Penicillins Other (See Comments)  . Tape Rash    Medications Reviewed Today    Reviewed by Vella Raring, [facility] (Pharmacist) on 06/12/20 at [id number]  Med List Status: <None>  Medication Order Taking? Sig Documenting Provider Last Dose Status Informant  acetaminophen (TYLENOL) 500 MG tablet [id number]  Take by mouth. [provider]  Active   Alcohol Swabs (B-D SINGLE USE SWABS REGULAR) PADS [id number]  Check blood sugar 2 times daily. Karamalegos, Devonne Doughty, DO  Active   ALPRAZolam Duanne Moron) 1 MG tablet [id number] Yes Take 1 mg  by mouth 3 (three) times daily. Sherri Rad, MD Taking Active   amLODipine (NORVASC) 5 MG tablet [id number] No Take 1 tablet (5 mg total) by mouth daily.  Patient not taking: Reported on 06/12/2020   Olin Hauser, DO Not Taking Active   aspirin EC 81 MG tablet [id number] Yes Take 81 mg by mouth daily. [provider] Taking Active   atorvastatin (LIPITOR) 10 MG tablet [id number] Yes TAKE 1 TABLET BY MOUTH DAILY AT 2 PM Karamalegos, Devonne Doughty, DO Taking Active   b complex vitamins capsule [id number] Yes Take by mouth. [provider] Taking Active   Blood Glucose Calibration (TRUE METRIX LEVEL 1) Low SOLN [id number]  Check blood sugar 2 times daily Karamalegos, Devonne Doughty, DO  Active   Blood Glucose Monitoring Suppl (TRUE METRIX AIR GLUCOSE METER) w/Device KIT [id number]  Check blood sugar 2 times daily Olin Hauser, DO  Active   buPROPion (WELLBUTRIN XL) 300 MG 24 hr tablet [id number] Yes Take 1 tablet (300 mg total) by mouth daily. Olin Hauser, DO Taking Active   Cholecalciferol 25 MCG (1000 UT) tablet [id number] Yes Take 1,000 Units by mouth.  [provider] Taking Active   CINNAMON PO [id number] Yes Take by mouth. [provider] Taking Active   Coenzyme Q10 200 MG TABS [id number] Yes Take 200 mg by mouth daily.  [provider] Taking  Active   desoximetasone (TOPICORT) 0.25 % cream [id number] Yes Apply 1 application topically 2 (two) times daily as needed. Olin Hauser, DO Taking Active   diclofenac sodium (VOLTAREN) 1 % GEL [id number] Yes Apply topically. [provider] Taking Active   dicyclomine (BENTYL) 20 MG tablet [id number] Yes TAKE 1 TABLET (20 MG TOTAL) BY MOUTH EVERY 6 (SIX) HOURS [provider] Taking Active            Med Note [name], Lourine Alberico A   Mon Jun 12, 2020  9:15 AM) As needed  DULoxetine (CYMBALTA) 30 MG capsule [id number] Yes Take 1 capsule (30 mg total) by mouth daily.  Take with existing 27m dose = total daily dose 975mKaramalegos, AlDevonne DoughtyDO Taking Active   DULoxetine (CYMBALTA) 60 MG capsule [id number] Take 1 capsule (60 mg total) by mouth daily. Take with existing 3018mose = total daily dose 32m55mramalegos, AlexDevonne Doughty Taking Active   fluticasone (FLONASE) 50 MCG/ACT nasal spray [id number] 2 sprays into both nostrils daily. [provider]  Active   gabapentin (NEURONTIN) 300 MG capsule [id number] Take 3 capsules by mouth 3 (three) times daily. [provider] Taking Active   hydrochlorothiazide (HYDRODIURIL) 25 MG tablet [id number] Take 0.5 tablets (12.5 mg total) by mouth daily.  Patient taking differently: Take 25 mg by mouth daily.    KaraOlin Hauser Taking Active   lisinopril (ZESTRIL) 5 MG tablet [id number] Take 1 tablet (5 mg total) by mouth daily. KaraOlin Hauser Taking Active   meloxicam (MOBIC) 7.5 MG tablet [id number] Take 7.5 mg by mouth 2 (two) times daily.  [provider] Taking Active   metFORMIN (GLUCOPHAGE) 1000 MG tablet [id number] Take 1 tablet (1,000 mg total) by mouth 2 (two) times daily with a meal. KaraParks RangerexDevonne Doughty Taking Active   Multiple Vitamin (MULTIVITAMIN) tablet [id number] Take 1 tablet by mouth daily. [provider] Taking Active   NEEDLE, DISP, 18 G (B-D DISP NEEDLE TW 18GX1") 18G X 1" MISC [id number] use to draw up medication into the syringe Stoioff, Scott C, MD  Active   NEEDLE, DISP, 21 G (BD SAFETYGLIDE SHIELDED NEEDLE) 21G X 1-1/2" [id number] use of administering medication in the IM location Stoioff, ScotRonda Fairly  Active   Omega-3 Fatty Acids (FISH OIL) 1200 MG CAPS [id number] Take 1 capsule (1,200 mg total) by mouth daily. KaraOlin Hauser Taking Active   OZEMPIC, 1 MG/DOSE, 2 MG/1.5ML SOPN [id number] Inject 1 mg into the skin once a week. KaraOlin Hauser Taking Active    pantoprazole (PROTONIX) 40 MG tablet [id number] Take 40 mg by mouth 2 (two) times daily. [provider] Taking Active   propranolol ER (INDERAL LA) 60 MG 24 hr capsule [id number] Take 1 capsule by mouth in the morning. [provider] Taking Active   Syringe, Disposable, 3 ML MISC [id number] syringes as directed by physician StoiAbbie Sons  Active   testosterone cypionate (DEPOTESTOSTERONE CYPIONATE) 200 MG/ML injection [id number] INJECT 1 ML (200 MG TOTAL) INTO THE MUSCLE EVERY 14 (FOURTEEN) DAYS. McGoNori Riis-C Taking Active   tiZANidine (ZANAFLEX) 4 MG tablet [id number] 1 tablet twice daily, May take an additional 1.5 tablet at bedtime [provider] Taking Active   traMADol (ULTRAM) 50 MG tablet [id number] Take by mouth every 6 (six)  hours as needed. [provider] Taking Active   TRUE METRIX BLOOD GLUCOSE TEST test strip [id number]  Check blood sugar 2 times a day Olin Hauser, DO  Active   TRUEplus Lancets 33G MISC [id number]  Check blood sugar 2 times a day Olin Hauser, DO  Active   zinc gluconate 50 MG tablet [id number] No Take by mouth.  Patient not taking: Reported on 06/12/2020   [provider] Not Taking Active            Assessment:   Goals Addressed            This Visit's Progress   . PharmD - Medication Assistance       CARE PLAN ENTRY (see longitudinal plan of care for additional care plan information)  Current Barriers:  . Financial Barriers in complicated patient with multiple medical conditions including T2DM, HTN, peripheral neuropathy, depression, GERD, tremors and chronic pain; patient has [employer] and reports copay for Ozempic is cost prohibitive at this time as he is currently in the coverage gap  Pharmacist Clinical Goal(s):  Marland Kitchen Over the next 30 days, patient will work with PharmD and providers to relieve medication access  concerns  Interventions: . Follow up with patient regarding medication assistance. Reports has completed letter of financial hardship and will return this along with [employer] patient assistance application and other supporting documents to [place] via mail. . Comprehensive medication review performed; medication list updated in electronic medical record o Caution for increased risk of dizziness/sedation with alprazolam, gabapentin, tizanidine and tramadol, particularly when taken in combination - Mr. Amis verbalizes understanding. Denies any current dizziness or sedation with these medications o Note patient currently using both oral NSAID (meloxicam) and topical NSAID (diclofenac gel), in addition to aspirin 81 mg daily - Counsel on increased GI risks with using topical NSAID with oral NSAID - Reports using diclofenac gel only as needed. Note patient using meloxicam and diclofenac gel as directed by [facility] Pain Management clinic provider - Note patient taking PPI, pantoprazole twice daily - Reports taking meloxicam doses with meals.  o Reports started taking propranolol ER 60 mg daily as directed by Neurologist for tremors and has stopped taking amlodipine as directed with start of propranolol. From review of chart, note change made by Dr. Manuella Ghazi at [facility] Visit on 7/27. o Note patient reports taking HCTZ 25 mg - 1 full tablet once daily, rather than  tablet daily - From review of chart, on 3/17 PCP advised patient to reduce HCTZ dose to 12.5 mg daily as he was advising patient to resume other HTN agent. . Patient denies having reduced HCTZ dose at that time due to difficulty with splitting the tablets. o Reports taking ceylon cinnamon supplement for diabetes.  - Note that based on available data, the Natural Medicines Database states "most research shows that oral Ceylon cinnamon does not benefit patients with type 2 diabetes". - Caution patient about limited data with supplements, as unlike  prescription medications, these do not undergo FDA approval and there may be insufficient data to confirm safety, efficacy or potential for interactions. Encourage when taking supplements, to select USP verified products, when available. Orpah Greek on importance of blood pressure control and monitoring o Reports having home upper arm BP monitor, but denies recently monitoring home BP. Denies symptoms such as dizziness or lightheadedness o Counsel on rational for home BP monitoring, particularly following a change in medication regimen. o Counsel patient to restart home  BP monitoring, keep log of results and to contact office if persistently >140/90 or new symptoms  o Counsel on blood pressure monitoring technique . Follow up regarding blood sugar control and monitoring o Reports taking: - Metformin 1000 mg twice daily - Ozempic 0.5 mg once weekly (reports using 0.5 mg weekly dose as provided with these samples from office) . Encourage patient to continue to follow up with office regarding availability of samples while continue to apply for assistance from [employer] o Reports recent morning fasting CBGs running ~150s o Counsel on importance of nutrition and exercise in blood sugar control . Will mail patient BP log as well as handout on BP monitoring technique as requested. . Coordination of care. Patient reports has appointment with new psychiatrist tomorrow. . Will collaborate with PCP . Inter-disciplinary care team collaboration (see longitudinal plan of care)  Patient Self Care Activities:  . Patient will provide necessary portions of application   Please see past updates related to this goal by clicking on the "Past Updates" button in the selected goal         Plan:  Telephone follow up appointment with care management team member scheduled for: 9/1 at 9:45 am  Harlow Asa, PharmD, [place] [phone]

## 2020-06-13 DIAGNOSIS — F341 Dysthymic disorder: Secondary | ICD-10-CM | POA: Diagnosis not present

## 2020-06-13 DIAGNOSIS — F339 Major depressive disorder, recurrent, unspecified: Secondary | ICD-10-CM | POA: Diagnosis not present

## 2020-06-13 DIAGNOSIS — M961 Postlaminectomy syndrome, not elsewhere classified: Secondary | ICD-10-CM | POA: Diagnosis not present

## 2020-06-13 DIAGNOSIS — G8921 Chronic pain due to trauma: Secondary | ICD-10-CM | POA: Diagnosis not present

## 2020-06-13 DIAGNOSIS — F419 Anxiety disorder, unspecified: Secondary | ICD-10-CM | POA: Insufficient documentation

## 2020-06-27 ENCOUNTER — Other Ambulatory Visit: Payer: Self-pay

## 2020-06-27 ENCOUNTER — Other Ambulatory Visit: Payer: Medicare HMO

## 2020-06-27 DIAGNOSIS — E291 Testicular hypofunction: Secondary | ICD-10-CM | POA: Diagnosis not present

## 2020-06-27 DIAGNOSIS — Z125 Encounter for screening for malignant neoplasm of prostate: Secondary | ICD-10-CM | POA: Diagnosis not present

## 2020-06-28 ENCOUNTER — Ambulatory Visit: Payer: Medicare HMO | Admitting: Urology

## 2020-06-28 ENCOUNTER — Encounter: Payer: Self-pay | Admitting: Urology

## 2020-06-28 VITALS — BP 117/77 | HR 82 | Ht 70.0 in | Wt 202.0 lb

## 2020-06-28 DIAGNOSIS — R399 Unspecified symptoms and signs involving the genitourinary system: Secondary | ICD-10-CM

## 2020-06-28 DIAGNOSIS — R350 Frequency of micturition: Secondary | ICD-10-CM | POA: Diagnosis not present

## 2020-06-28 DIAGNOSIS — E291 Testicular hypofunction: Secondary | ICD-10-CM | POA: Diagnosis not present

## 2020-06-28 LAB — TESTOSTERONE: Testosterone: 231 ng/dL — ABNORMAL LOW (ref 264–916)

## 2020-06-28 LAB — HEMATOCRIT: Hematocrit: 47.6 % (ref 37.5–51.0)

## 2020-06-28 MED ORDER — TAMSULOSIN HCL 0.4 MG PO CAPS: 0.4000 mg | ORAL_CAPSULE | Freq: Every day | ORAL | 2 refills | Status: DC

## 2020-06-28 NOTE — Progress Notes (Signed)
06/28/2020 5:51 PM   [name] 08/17/1961 [id number]  Referring provider: Olin Hauser, DO [address] [place],  [place] [address]  Chief Complaint  Patient presents with  . Hypogonadism    Urologic history: 1.  Hypogonadism             -Testosterone cypionate every 2 weeks  2.  Erectile dysfunction  HPI: 59 y.o. presents for annual follow-up.   Since his visit last year notes bothersome lower urinary tract symptoms over the past 2 months  Complains of frequency, intermittent and weak urinary stream and sensation incomplete emptying, double voiding  IPSS 16/35  Denies dysuria or gross hematuria  Remains on testosterone cypionate though is 1 week late for dose  Testosterone level 8/2 was 231, hematocrit normal, PSA was not run     PMH: Past Medical History:  Diagnosis Date  . ADHD (attention deficit hyperactivity disorder)   . Anxiety   . Coronary artery disease   . Coronary artery disease   . Coronary atherosclerosis   . Depression   . GERD (gastroesophageal reflux disease)   . Glaucoma   . Headache    migrane  . Heart disease   . Hyperlipidemia   . Hypertension   . Hypertriglyceridemia   . Hypogonadism in male   . MVP (mitral valve prolapse)   . Narcolepsy and cataplexy   . Non-alcoholic fatty liver disease     Surgical History: Past Surgical History:  Procedure Laterality Date  . COLONOSCOPY WITH PROPOFOL N/A 11/18/2018   Procedure: COLONOSCOPY WITH PROPOFOL;  Surgeon: Manya Silvas, MD;  Location: [facility] ENDOSCOPY;  Service: Endoscopy;  Laterality: N/A;  . ESOPHAGOGASTRODUODENOSCOPY (EGD) WITH PROPOFOL N/A 11/18/2018   Procedure: ESOPHAGOGASTRODUODENOSCOPY (EGD) WITH PROPOFOL;  Surgeon: Manya Silvas, MD;  Location[facility] ENDOSCOPY;  Service: Endoscopy;  Laterality: N/A;  . pain pump implant and removal Bilateral    Dec. 21 2019  . SPINE SURGERY     Multiple surgeries, initial 2001, thoracic disc repair and recurrent  revisions.  . THROAT SURGERY      Home Medications:  Allergies as of 06/28/2020      Reactions   Benzoin Other (See Comments)   blisters   Butrans [buprenorphine]    Severe Burn pain patch, blisters   Doxycycline Hyclate Nausea Only   Morphine Other (See Comments)   Penicillins Other (See Comments)   Tape Rash      Medication List       Accurate as of June 28, 2020  5:51 PM. If you have any questions, ask your nurse or doctor.        acetaminophen 500 MG tablet Commonly known as: TYLENOL Take by mouth.   ALPRAZolam 1 MG tablet Commonly known as: XANAX Take 1 mg by mouth 3 (three) times daily.   aspirin EC 81 MG tablet Take 81 mg by mouth daily.   atorvastatin 10 MG tablet Commonly known as: LIPITOR TAKE 1 TABLET BY MOUTH DAILY AT 2 PM   b complex vitamins capsule Take by mouth.   B-D DISP NEEDLE TW 18GX1" 18G X 1" Misc Generic drug: NEEDLE (DISP) 18 G For use to draw up medication into the syringe   B-D SINGLE USE SWABS REGULAR Pads Check blood sugar 2 times daily.   BD SafetyGlide Shielded Needle 21G X 1-1/2" Misc Generic drug: NEEDLE (DISP) 21 G For use of administering medication in the IM location   buPROPion 300 MG 24 hr tablet Commonly  known as: WELLBUTRIN XL Take 1 tablet (300 mg total) by mouth daily.   butalbital-acetaminophen-caffeine 50-325-40 MG tablet Commonly known as: FIORICET Take 1 tablet by mouth daily as needed for headache.   Cholecalciferol 25 MCG (1000 UT) tablet Take 1,000 Units by mouth.   CINNAMON PO Take by mouth.   Coenzyme Q10 200 MG Tabs Take 200 mg by mouth daily.   desoximetasone 0.25 % cream Commonly known as: TOPICORT Apply 1 application topically 2 (two) times daily as needed.   diclofenac sodium 1 % Gel Commonly known as: VOLTAREN Apply topically.   dicyclomine 20 MG tablet Commonly known as: BENTYL TAKE 1 TABLET (20 MG TOTAL) BY MOUTH EVERY 6 (SIX) HOURS   DULoxetine 30 MG capsule Commonly known  as: CYMBALTA Take 1 capsule (30 mg total) by mouth daily. Take with existing $RemoveBefor'60mg'XRfIcnHHjfdh$  dose = total daily dose $RemoveBe'90mg'aJkALAIfg$    DULoxetine 60 MG capsule Commonly known as: CYMBALTA Take 1 capsule (60 mg total) by mouth daily. Take with existing $RemoveBefor'30mg'vMcITtImPegD$  dose = total daily dose $RemoveBe'90mg'gfCYqzTWn$    Fish Oil 1200 MG Caps Take 1 capsule (1,200 mg total) by mouth daily.   fluticasone 50 MCG/ACT nasal spray Commonly known as: FLONASE Place 2 sprays into both nostrils daily.   gabapentin 300 MG capsule Commonly known as: NEURONTIN Take 3 capsules by mouth 3 (three) times daily.   hydrochlorothiazide 25 MG tablet Commonly known as: HYDRODIURIL Take 1 tablet (25 mg total) by mouth daily.   lisinopril 5 MG tablet Commonly known as: ZESTRIL Take 1 tablet (5 mg total) by mouth daily.   meloxicam 7.5 MG tablet Commonly known as: MOBIC Take 7.5 mg by mouth 2 (two) times daily.   metFORMIN 1000 MG tablet Commonly known as: GLUCOPHAGE Take 1 tablet (1,000 mg total) by mouth 2 (two) times daily with a meal.   multivitamin tablet Take 1 tablet by mouth daily.   Ozempic (1 MG/DOSE) 2 MG/1.5ML Sopn Generic drug: Semaglutide (1 MG/DOSE) Inject 1 mg into the skin once a week.   pantoprazole 40 MG tablet Commonly known as: PROTONIX Take 40 mg by mouth 2 (two) times daily.   propranolol ER 60 MG 24 hr capsule Commonly known as: INDERAL LA Take 1 capsule by mouth daily.   Syringe (Disposable) 3 ML Misc Use syringes as directed by physician   testosterone cypionate 200 MG/ML injection Commonly known as: DEPOTESTOSTERONE CYPIONATE INJECT 1 ML (200 MG TOTAL) INTO THE MUSCLE EVERY 14 (FOURTEEN) DAYS.   tiZANidine 4 MG tablet Commonly known as: ZANAFLEX 1 tablet twice daily, May take an additional 1.5 tablet at bedtime   traMADol 50 MG tablet Commonly known as: ULTRAM Take by mouth every 6 (six) hours as needed.   True Metrix Air Glucose Meter w/Device Kit Check blood sugar 2 times daily   True Metrix Blood  Glucose Test test strip Generic drug: glucose blood Check blood sugar 2 times a day   True Metrix Level 1 Low Soln Check blood sugar 2 times daily   TRUEplus Lancets 33G Misc Check blood sugar 2 times a day       Allergies:  Allergies  Allergen Reactions  . Benzoin Other (See Comments)    blisters  . Butrans [Buprenorphine]     Severe Burn pain patch, blisters  . Doxycycline Hyclate Nausea Only  . Morphine Other (See Comments)  . Penicillins Other (See Comments)  . Tape Rash    Family History: Family History  Problem Relation Age of Onset  .  Depression Mother   . Anxiety disorder Mother   . Diabetes Mother   . Heart failure Mother   . Heart attack Father   . Diabetes Sister   . Obesity Sister   . Drug abuse Brother   . Alcohol abuse Brother   . Depression Sister   . Anxiety disorder Sister   . Drug abuse Sister   . Anxiety disorder Sister   . Seizures Sister   . Diabetes Sister   . Depression Sister     Social History:  reports that he quit smoking about 9 years ago. His smoking use included cigarettes. He started smoking about 40 years ago. He quit smokeless tobacco use about 9 years ago. He reports that he does not drink alcohol and does not use drugs.   Physical Exam: BP 117/77   Pulse 82   Ht $R'5\' 10"'Ph$  (1.778 m)   Wt 202 lb (91.6 kg)   BMI 28.98 kg/m   Constitutional:  Alert and oriented, No acute distress. HEENT: [place] AT, moist mucus membranes.  Trachea midline, no masses. Cardiovascular: No clubbing, cyanosis, or edema. Respiratory: Normal respiratory effort, no increased work of breathing. GU: Prostate 30 g, smooth without nodules Lymph: No cervical or inguinal lymphadenopathy. Skin: No rashes, bruises or suspicious lesions. Neurologic: Grossly intact, no focal deficits, moving all 4 extremities. Psychiatric: Normal mood and affect.   Assessment & Plan:    1.  Lower urinary tract symptoms  Worsening LUTS over the past 2 months  Urinalysis  was unremarkable  PVR by bladder scan 33 mL  Management options were discussed including observation, medications  States similar symptoms a few years ago and took tamsulosin for several months then discontinued  Desires to try again and Rx sent to pharmacy  2.  Hypogonadism  Testosterone was low however 1 week late for dose  Will add on PSA from blood drawn yesterday  65-month lab visit and continue annual follow-up   Abbie Sons, [place] [address] [place], [place] [address] [phone]

## 2020-06-29 ENCOUNTER — Ambulatory Visit: Payer: 59 | Admitting: Urology

## 2020-06-29 LAB — PSA: Prostate Specific Ag, Serum: 0.5 ng/mL (ref 0.0–4.0)

## 2020-06-29 LAB — MICROSCOPIC EXAMINATION: Bacteria, UA: NONE SEEN

## 2020-06-29 LAB — URINALYSIS, COMPLETE
Bilirubin, UA: NEGATIVE
Glucose, UA: NEGATIVE
Ketones, UA: NEGATIVE
Leukocytes,UA: NEGATIVE
Nitrite, UA: NEGATIVE
Protein,UA: NEGATIVE
RBC, UA: NEGATIVE
Specific Gravity, UA: 1.02 (ref 1.005–1.030)
Urobilinogen, Ur: 0.2 mg/dL (ref 0.2–1.0)
pH, UA: 5.5 (ref 5.0–7.5)

## 2020-06-29 LAB — SPECIMEN STATUS REPORT

## 2020-06-30 ENCOUNTER — Telehealth: Payer: Self-pay | Admitting: *Deleted

## 2020-06-30 NOTE — Telephone Encounter (Signed)
Notified patient as instructed, patient pleased. Discussed follow-up appointments, patient agrees  

## 2020-06-30 NOTE — Telephone Encounter (Signed)
-----   Message from Abbie Sons, MD sent at 06/30/2020  6:47 AM EDT ----- Urinalysis was normal

## 2020-07-04 ENCOUNTER — Encounter: Payer: Self-pay | Admitting: Urology

## 2020-07-05 ENCOUNTER — Ambulatory Visit: Payer: Self-pay | Admitting: Pharmacist

## 2020-07-05 DIAGNOSIS — E1140 Type 2 diabetes mellitus with diabetic neuropathy, unspecified: Secondary | ICD-10-CM

## 2020-07-05 DIAGNOSIS — F331 Major depressive disorder, recurrent, moderate: Secondary | ICD-10-CM

## 2020-07-05 DIAGNOSIS — I10 Essential (primary) hypertension: Secondary | ICD-10-CM

## 2020-07-05 NOTE — Chronic Care Management (AMB) (Signed)
Chronic Care Management   Follow Up Note   07/05/2020 Name: Dale [name]. MRN: [id number] DOB: 05-05-1961  Referred by: Olin Hauser, DO Reason for referral : Chronic Care Management (Patient Phone Call)   Dale [name]. is a 59 y.o. year old male who is a primary care patient of Olin Hauser, DO. The CCM team was consulted for assistance with chronic disease management and care coordination needs.    I reached out to [name] by phone today.   Review of patient status, including review of consultants reports, relevant laboratory and other test results, and collaboration with appropriate care team members and the patient's provider was performed as part of comprehensive patient evaluation and provision of chronic care management services.    SDOH (Social Determinants of Health) assessments performed: No See Care Plan activities for detailed interventions related to [facility])     Outpatient Encounter Medications as of 07/05/2020  Medication Sig Note  . hydrochlorothiazide (HYDRODIURIL) 25 MG tablet Take 1 tablet (25 mg total) by mouth daily.   . metFORMIN (GLUCOPHAGE) 1000 MG tablet Take 1 tablet (1,000 mg total) by mouth 2 (two) times daily with a meal.   . OZEMPIC, 1 MG/DOSE, 2 MG/1.5ML SOPN Inject 1 mg into the skin once a week. 07/05/2020: Taking Ozempic 0.5 mg weekly (samples)  . propranolol ER (INDERAL LA) 60 MG 24 hr capsule Take 1 capsule by mouth daily.    Marland Kitchen acetaminophen (TYLENOL) 500 MG tablet Take by mouth.   . Alcohol Swabs (B-D SINGLE USE SWABS REGULAR) PADS Check blood sugar 2 times daily.   Marland Kitchen ALPRAZolam (XANAX) 1 MG tablet Take 1 mg by mouth 3 (three) times daily.   Marland Kitchen aspirin EC 81 MG tablet Take 81 mg by mouth daily.   Marland Kitchen atorvastatin (LIPITOR) 10 MG tablet TAKE 1 TABLET BY MOUTH DAILY AT 2 PM   . b complex vitamins capsule Take by mouth.   . Blood Glucose Calibration (TRUE METRIX LEVEL 1) Low SOLN Check blood sugar 2 times daily    . Blood Glucose Monitoring Suppl (TRUE METRIX AIR GLUCOSE METER) w/Device KIT Check blood sugar 2 times daily   . buPROPion (WELLBUTRIN XL) 300 MG 24 hr tablet Take 1 tablet (300 mg total) by mouth daily.   . butalbital-acetaminophen-caffeine (FIORICET) 50-325-40 MG tablet Take 1 tablet by mouth daily as needed for headache.   . Cholecalciferol 25 MCG (1000 UT) tablet Take 1,000 Units by mouth.    Marland Kitchen CINNAMON PO Take by mouth.   . Coenzyme Q10 200 MG TABS Take 200 mg by mouth daily.    Marland Kitchen desoximetasone (TOPICORT) 0.25 % cream Apply 1 application topically 2 (two) times daily as needed.   . diclofenac sodium (VOLTAREN) 1 % GEL Apply topically.   . dicyclomine (BENTYL) 20 MG tablet TAKE 1 TABLET (20 MG TOTAL) BY MOUTH EVERY 6 (SIX) HOURS 06/12/2020: As needed  . DULoxetine (CYMBALTA) 30 MG capsule Take 1 capsule (30 mg total) by mouth daily. Take with existing 2m dose = total daily dose 925m  . DULoxetine (CYMBALTA) 60 MG capsule Take 1 capsule (60 mg total) by mouth daily. Take with existing 3067mose = total daily dose 13m35m. fluticasone (FLONASE) 50 MCG/ACT nasal spray Place 2 sprays into both nostrils daily.   . gaMarland Kitchenapentin (NEURONTIN) 300 MG capsule Take 3 capsules by mouth 3 (three) times daily.   . liMarland Kitcheninopril (ZESTRIL) 5 MG tablet Take 1 tablet (  5 mg total) by mouth daily.   . meloxicam (MOBIC) 7.5 MG tablet Take 7.5 mg by mouth 2 (two) times daily.    . Multiple Vitamin (MULTIVITAMIN) tablet Take 1 tablet by mouth daily.   Marland Kitchen NEEDLE, DISP, 18 G (B-D DISP NEEDLE TW 18GX1") 18G X 1" MISC For use to draw up medication into the syringe   . NEEDLE, DISP, 21 G (BD SAFETYGLIDE SHIELDED NEEDLE) 21G X 1-1/2" MISC For use of administering medication in the IM location   . Omega-3 Fatty Acids (FISH OIL) 1200 MG CAPS Take 1 capsule (1,200 mg total) by mouth daily.   . pantoprazole (PROTONIX) 40 MG tablet Take 40 mg by mouth 2 (two) times daily.   . Syringe, Disposable, 3 ML MISC Use syringes as  directed by physician   . tamsulosin (FLOMAX) 0.4 MG CAPS capsule Take 1 capsule (0.4 mg total) by mouth daily.   Marland Kitchen testosterone cypionate (DEPOTESTOSTERONE CYPIONATE) 200 MG/ML injection INJECT 1 ML (200 MG TOTAL) INTO THE MUSCLE EVERY 14 (FOURTEEN) DAYS.   Marland Kitchen tiZANidine (ZANAFLEX) 4 MG tablet 1 tablet twice daily, May take an additional 1.5 tablet at bedtime   . traMADol (ULTRAM) 50 MG tablet Take by mouth every 6 (six) hours as needed.   . TRUE METRIX BLOOD GLUCOSE TEST test strip Check blood sugar 2 times a day   . TRUEplus Lancets 33G MISC Check blood sugar 2 times a day    No facility-administered encounter medications on file as of 07/05/2020.    Goals Addressed            This Visit's Progress   . PharmD - Medication Assistance       CARE PLAN ENTRY (see longitudinal plan of care for additional care plan information)  Current Barriers:  . Financial Barriers in complicated patient with multiple medical conditions including T2DM, HTN, peripheral neuropathy, depression, GERD, tremors and chronic pain; patient has [employer] and reports copay for Ozempic is cost prohibitive at this time as he is currently in the coverage gap  Pharmacist Clinical Goal(s):  Marland Kitchen Over the next 30 days, patient will work with PharmD and providers to relieve medication access concerns  Interventions: . Follow up with patient regarding medication assistance. Reports mailed [employer] patient assistance application and supporting documents to [place] a couple of days ago . Counse on importance of blood pressure control and monitoring o Note patient has home upper arm BP monitor o Note patient taking: - HCTZ 25 mg daily - Lisinopril 5 mg daily - Propranolol ER 60 mg daily o Reports recent resting BP readings have been good - 8/31: 105/79, HR 79 - 8/30: 113/88, HR 88 o Reports tremor significantly improved since Neurologist started him on propranolol ER o Encourage patient to continue  home BP monitoring, keep log of results and bring record to medical appointments o Counsel on blood pressure monitoring technique . Follow up regarding blood sugar control and monitoring o Reports taking: - Metformin 1000 mg twice daily - Ozempic 0.5 mg once weekly (reports using 0.5 mg weekly dose as provided with these samples from office) . Encourage patient to continue to follow up with office regarding availability of samples while continue to apply for assistance from [place] recent morning fasting CBGs running 89-126 o Counsel on importance of nutrition and exercise in blood sugar control - Reports exercise currently limited to yardwork and chores around his home.  - Reports depression limiting his desire/motivation to  exercise . Confirms had appointment with new psychiatrist, but disappointed that visits wiill be virtual . Patient intrested in speaking with CCM Social worker - will send referral . Inter-disciplinary care team collaboration (see longitudinal plan of care)  Patient Self Care Activities:  . Patient will provide necessary portions of application   Please see past updates related to this goal by clicking on the "Past Updates" button in the selected goal         Plan Will send referral to [place] Worker The care management team will reach out to the patient again over the next 60 days.   Harlow Asa, PharmD, [place] [employer] [phone]

## 2020-07-05 NOTE — Addendum Note (Signed)
Addended by: Harlow Asa A on: 07/05/2020 11:20 AM   Modules accepted: Orders

## 2020-07-05 NOTE — Patient Instructions (Signed)
Thank you allowing the Chronic Care Management Team to be a part of your care! It was a pleasure speaking with you today!     CCM (Chronic Care Management) Team    Noreene Larsson RN, MSN, CCM Nurse Care Coordinator  [phone]   Harlow Asa PharmD  Clinical Pharmacist  [phone]   Eula Fried LCSW Clinical Social Worker [phone]  Visit Information  Goals Addressed            This Visit's Progress   . PharmD - Medication Assistance       CARE PLAN ENTRY (see longitudinal plan of care for additional care plan information)  Current Barriers:  . Financial Barriers in complicated patient with multiple medical conditions including T2DM, HTN, peripheral neuropathy, depression, GERD, tremors and chronic pain; patient has [employer] and reports copay for Ozempic is cost prohibitive at this time as he is currently in the coverage gap  Pharmacist Clinical Goal(s):  Marland Kitchen Over the next 30 days, patient will work with PharmD and providers to relieve medication access concerns  Interventions: . Follow up with patient regarding medication assistance. Reports mailed [employer] patient assistance application and supporting documents to [place] a couple of days ago . Counse on importance of blood pressure control and monitoring o Note patient has home upper arm BP monitor o Note patient taking: - HCTZ 25 mg daily - Lisinopril 5 mg daily - Propranolol ER 60 mg daily o Reports recent resting BP readings have been good - 8/31: 105/79, HR 79 - 8/30: 113/88, HR 88 o Reports tremor significantly improved since Neurologist started him on propranolol ER o Encourage patient to continue home BP monitoring, keep log of results and bring record to medical appointments o Counsel on blood pressure monitoring technique . Follow up regarding blood sugar control and monitoring o Reports taking: - Metformin 1000 mg twice daily - Ozempic 0.5 mg once weekly (reports using  0.5 mg weekly dose as provided with these samples from office) . Encourage patient to continue to follow up with office regarding availability of samples while continue to apply for assistance from [place] recent morning fasting CBGs running 89-126 o Counsel on importance of nutrition and exercise in blood sugar control - Reports exercise currently limited to yardwork and chores around his home.  - Reports depression limiting his desire/motivation to exercise . Confirms had appointment with new psychiatrist, but disappointed that visits wiill be virtual . Patient intrested in speaking with CCM Social worker - will send referral . Inter-disciplinary care team collaboration (see longitudinal plan of care)  Patient Self Care Activities:  . Patient will provide necessary portions of application   Please see past updates related to this goal by clicking on the "Past Updates" button in the selected goal         Patient verbalizes understanding of instructions provided today.   The care management team will reach out to the patient again over the next 60 days.   Harlow Asa, PharmD, [place] [employer] [phone]

## 2020-07-06 DIAGNOSIS — G6289 Other specified polyneuropathies: Secondary | ICD-10-CM | POA: Diagnosis not present

## 2020-07-06 DIAGNOSIS — M961 Postlaminectomy syndrome, not elsewhere classified: Secondary | ICD-10-CM | POA: Diagnosis not present

## 2020-07-06 DIAGNOSIS — G894 Chronic pain syndrome: Secondary | ICD-10-CM | POA: Diagnosis not present

## 2020-07-06 DIAGNOSIS — M47816 Spondylosis without myelopathy or radiculopathy, lumbar region: Secondary | ICD-10-CM | POA: Diagnosis not present

## 2020-07-12 ENCOUNTER — Other Ambulatory Visit: Payer: Self-pay | Admitting: Urology

## 2020-07-18 ENCOUNTER — Other Ambulatory Visit: Payer: Medicare HMO

## 2020-07-18 ENCOUNTER — Telehealth: Payer: Medicare HMO

## 2020-07-18 ENCOUNTER — Telehealth: Payer: Self-pay | Admitting: Licensed Clinical Social Worker

## 2020-07-18 DIAGNOSIS — R200 Anesthesia of skin: Secondary | ICD-10-CM | POA: Diagnosis not present

## 2020-07-18 DIAGNOSIS — R202 Paresthesia of skin: Secondary | ICD-10-CM | POA: Diagnosis not present

## 2020-07-18 NOTE — Telephone Encounter (Signed)
Chronic Care Management    Clinical Social Work General Follow Up Note  07/18/2020 Name: Dale [name]. MRN: [id number] DOB: 1961-06-17  Dale [name]. is a 59 y.o. year old male who is a primary care patient of Olin Hauser, DO. The CCM team was consulted for assistance with Mental Health Counseling and Resources.   Review of patient status, including review of consultants reports, relevant laboratory and other test results, and collaboration with appropriate care team members and the patient's provider was performed as part of comprehensive patient evaluation and provision of chronic care management services.    LCSW completed CCM outreach attempt today but was unable to reach patient successfully. A HIPPA compliant voice message was left encouraging patient to return call once available. LCSW will ask Scheduling Care Guide to reschedule CCM SW appointment with patient as well.  Outpatient Encounter Medications as of 07/18/2020  Medication Sig Note  . acetaminophen (TYLENOL) 500 MG tablet Take by mouth.   . Alcohol Swabs (B-D SINGLE USE SWABS REGULAR) PADS Check blood sugar 2 times daily.   Marland Kitchen ALPRAZolam (XANAX) 1 MG tablet Take 1 mg by mouth 3 (three) times daily.   Marland Kitchen aspirin EC 81 MG tablet Take 81 mg by mouth daily.   Marland Kitchen atorvastatin (LIPITOR) 10 MG tablet TAKE 1 TABLET BY MOUTH DAILY AT 2 PM   . b complex vitamins capsule Take by mouth.   . Blood Glucose Calibration (TRUE METRIX LEVEL 1) Low SOLN Check blood sugar 2 times daily   . Blood Glucose Monitoring Suppl (TRUE METRIX AIR GLUCOSE METER) w/Device KIT Check blood sugar 2 times daily   . buPROPion (WELLBUTRIN XL) 300 MG 24 hr tablet Take 1 tablet (300 mg total) by mouth daily.   . butalbital-acetaminophen-caffeine (FIORICET) 50-325-40 MG tablet Take 1 tablet by mouth daily as needed for headache.   . Cholecalciferol 25 MCG (1000 UT) tablet Take 1,000 Units by mouth.    Marland Kitchen CINNAMON PO Take by mouth.   . Coenzyme  Q10 200 MG TABS Take 200 mg by mouth daily.    Marland Kitchen desoximetasone (TOPICORT) 0.25 % cream Apply 1 application topically 2 (two) times daily as needed.   . diclofenac sodium (VOLTAREN) 1 % GEL Apply topically.   . dicyclomine (BENTYL) 20 MG tablet TAKE 1 TABLET (20 MG TOTAL) BY MOUTH EVERY 6 (SIX) HOURS 06/12/2020: As needed  . DULoxetine (CYMBALTA) 30 MG capsule Take 1 capsule (30 mg total) by mouth daily. Take with existing $RemoveBefor'60mg'UDwMOOznDnVF$  dose = total daily dose $RemoveBe'90mg'fQzBXbjik$    . DULoxetine (CYMBALTA) 60 MG capsule Take 1 capsule (60 mg total) by mouth daily. Take with existing $RemoveBefor'30mg'YFdZaLCtEMyb$  dose = total daily dose $RemoveBe'90mg'cDokiSwOg$    . fluticasone (FLONASE) 50 MCG/ACT nasal spray Place 2 sprays into both nostrils daily.   Marland Kitchen gabapentin (NEURONTIN) 300 MG capsule Take 3 capsules by mouth 3 (three) times daily.   . hydrochlorothiazide (HYDRODIURIL) 25 MG tablet Take 1 tablet (25 mg total) by mouth daily.   Marland Kitchen lisinopril (ZESTRIL) 5 MG tablet Take 1 tablet (5 mg total) by mouth daily.   . meloxicam (MOBIC) 7.5 MG tablet Take 7.5 mg by mouth 2 (two) times daily.    . metFORMIN (GLUCOPHAGE) 1000 MG tablet Take 1 tablet (1,000 mg total) by mouth 2 (two) times daily with a meal.   . Multiple Vitamin (MULTIVITAMIN) tablet Take 1 tablet by mouth daily.   Marland Kitchen NEEDLE, DISP, 18 G (B-D DISP NEEDLE TW 18GX1") 18G X 1"  MISC For use to draw up medication into the syringe   . NEEDLE, DISP, 21 G (BD SAFETYGLIDE SHIELDED NEEDLE) 21G X 1-1/2" MISC For use of administering medication in the IM location   . Omega-3 Fatty Acids (FISH OIL) 1200 MG CAPS Take 1 capsule (1,200 mg total) by mouth daily.   Marland Kitchen OZEMPIC, 1 MG/DOSE, 2 MG/1.5ML SOPN Inject 1 mg into the skin once a week. 07/05/2020: Taking Ozempic 0.5 mg weekly (samples)  . pantoprazole (PROTONIX) 40 MG tablet Take 40 mg by mouth 2 (two) times daily.   . propranolol ER (INDERAL LA) 60 MG 24 hr capsule Take 1 capsule by mouth daily.    . Syringe, Disposable, 3 ML MISC Use syringes as directed by physician   .  tamsulosin (FLOMAX) 0.4 MG CAPS capsule Take 1 capsule (0.4 mg total) by mouth daily.   Marland Kitchen testosterone cypionate (DEPOTESTOSTERONE CYPIONATE) 200 MG/ML injection INJECT 1 ML (200 MG TOTAL) INTO THE MUSCLE EVERY 14 (FOURTEEN) DAYS.   Marland Kitchen tiZANidine (ZANAFLEX) 4 MG tablet 1 tablet twice daily, May take an additional 1.5 tablet at bedtime   . traMADol (ULTRAM) 50 MG tablet Take by mouth every 6 (six) hours as needed.   . TRUE METRIX BLOOD GLUCOSE TEST test strip Check blood sugar 2 times a day   . TRUEplus Lancets 33G MISC Check blood sugar 2 times a day    No facility-administered encounter medications on file as of 07/18/2020.    Follow Up Plan: [place] will reach out to patient to reschedule appointment.   Eula Fried, BSW, MSW, [email or web address] Phone: [phone]

## 2020-07-24 ENCOUNTER — Other Ambulatory Visit: Payer: Self-pay | Admitting: Family Medicine

## 2020-07-24 DIAGNOSIS — E1169 Type 2 diabetes mellitus with other specified complication: Secondary | ICD-10-CM

## 2020-07-24 NOTE — Telephone Encounter (Signed)
Pt has been r/s  

## 2020-07-25 ENCOUNTER — Ambulatory Visit (INDEPENDENT_AMBULATORY_CARE_PROVIDER_SITE_OTHER): Payer: Medicare HMO | Admitting: Family Medicine

## 2020-07-25 ENCOUNTER — Other Ambulatory Visit: Payer: Self-pay

## 2020-07-25 ENCOUNTER — Encounter: Payer: Self-pay | Admitting: Family Medicine

## 2020-07-25 VITALS — BP 100/64 | HR 72 | Temp 97.3°F | Resp 16 | Ht 70.0 in | Wt 213.6 lb

## 2020-07-25 DIAGNOSIS — Z0000 Encounter for general adult medical examination without abnormal findings: Secondary | ICD-10-CM | POA: Diagnosis not present

## 2020-07-25 DIAGNOSIS — E1140 Type 2 diabetes mellitus with diabetic neuropathy, unspecified: Secondary | ICD-10-CM | POA: Diagnosis not present

## 2020-07-25 DIAGNOSIS — F331 Major depressive disorder, recurrent, moderate: Secondary | ICD-10-CM

## 2020-07-25 DIAGNOSIS — I10 Essential (primary) hypertension: Secondary | ICD-10-CM

## 2020-07-25 LAB — POCT GLYCOSYLATED HEMOGLOBIN (HGB A1C): Hemoglobin A1C: 5.5 % (ref 4.0–5.6)

## 2020-07-25 NOTE — Patient Instructions (Addendum)
Thank you for coming to the office today.  Recent Labs    01/19/20 1113 07/25/20 1152  HGBA1C 5.6 5.5   Keep up the good work overall.  Reduce Metformin from 1000 to 500 - chop pills in half, take half TWICE a day for now. While on Ozempic 0.5  Sample today, check back with Grayland Ormond for updates on the 1.0  If/when you get to 1.0 can possibly STOP metformin completely.  Last PSA was normal, keep up with Urologist on that.  [facility] - let me know when you get it.   Please schedule a Follow-up Appointment to: Return in about 6 months (around 01/22/2021) for 6 month DM A1c.  If you have any other questions or concerns, please feel free to call the office or send a message through [place]. You may also schedule an earlier appointment if necessary.  Additionally, you may be receiving a survey about your experience at our office within a few days to 1 week by e-mail or mail. We value your feedback.  [name], DO [place]

## 2020-07-25 NOTE — Progress Notes (Signed)
Subjective:    Patient ID: Dale [name]., male    DOB: 04-11-1961, 59 y.o.   MRN: [id number]  Dale Shuck. is a 59 y.o. male presenting on 07/25/2020 for Annual Exam   HPI  Here for Annual Physical and Lab Review  CHRONIC DM, Type 2/ Peripheral Neuropathy  He seems to do better on Ozempic 1.0mg  weekly, however waiting on financial assistance from manufacturer and working with [place], and will need another sample of Ozempic 0.5mg  weekly. - His sugar reading improved, A1c, has no hyperglycemia and no hypoglycemia - goal to reduce metformin today reduce from 1000 to 500 BID take half pill  Last A1c down to 5.5 CBGimproved, no log today Meds:Metformin 1000mg  BID, Ozempic 0.5mg  weekly inj Reports good compliance. Tolerating well w/o side-effects Currently on ACEilow dose Lifestyle: - Diet: reduced portions improved - Exercise (limited due to pain and neuropathy) Chronic numbness tingling feet, with history of other neuropathy, followed by [facility]- limited benefit from Podiatry as well at previous visits. Podiatry gave him shoe insoles and neurology continued current meds with gabapentin [phone], Cymbalta 60mg , and offered Scrambler therapy / TENS unit for feetpreviously. Ultimately determined to be related to complication from prior back surgery - maintained neuropathy, neurology did nerve conduction and he is trying to determine if spinal/low back issues can be repaired DM Eye exam up to date Denies hypoglycemia, polyuria, visual changes  PMH - Chronic Pain Syndrome  HYPERLIPIDEMIA: - Reports no concerns. Last lipid panelimproved control 07/2019, controlled LDL, mild elevated TG but still improved. - Currently takingAtorvastatin 10mg , tolerating well without side effects or myalgias  LUTS Hypogonadism Followed by Urology - Lab testing for testosterone  CHRONIC HTN  Reports no new concerns Current Meds - Lisinopril 5mg   HCTZ  25mg  Reports good compliance, took meds today. Tolerating well, w/o complaints. Denies CP, dyspnea, HA, edema, dizziness / lightheadedness   Additional update  Needs refill Topicort for eczema PRN.  Health Maintenance:  [facility]  PSA 0.5, negative screening, 06/27/20  Followed by [facility], Dr Bernardo Heater  Due for Flu Shot,[facility]13[facility]19  Depression screen [facility] 2/9 07/25/2020 01/19/2020 07/20/2019  Decreased Interest 2 1 0  Down, Depressed, Hopeless 1 1 0  PHQ - 2 Score 3 2 0  Altered sleeping 0 0 3  Tired, decreased energy 1 2 3   Change in appetite 0 0 0  Feeling bad or failure about yourself  0 0 0  Trouble concentrating 0 1 0  Moving slowly or fidgety/restless 0 0 0  Suicidal thoughts 0 0 0  PHQ-9 Score 4 5 6   Difficult doing work/chores Somewhat difficult Somewhat difficult Not difficult at all   GAD 7 : Generalized Anxiety Score 07/25/2020 01/19/2020 04/13/2019  Nervous, Anxious, on Edge 1 1 1   Control/stop worrying 0 0 0  Worry too much - different things 0 0 0  Trouble relaxing 1 1 0  Restless 0 0 0  Easily annoyed or irritable 1 1 1   Afraid - awful might happen 0 0 0  Total GAD 7 Score 3 3 2   Anxiety Difficulty Somewhat difficult Somewhat difficult Not difficult at all     Past Medical History:  Diagnosis Date  . ADHD (attention deficit hyperactivity disorder)   . Anxiety   . Coronary artery disease   . Coronary artery disease   . Coronary atherosclerosis   . Depression   . GERD (gastroesophageal reflux disease)   .  Glaucoma   . Headache    migrane  . Heart disease   . Hyperlipidemia   . Hypertension   . Hypertriglyceridemia   . Hypogonadism in male   . MVP (mitral valve prolapse)   . Narcolepsy and cataplexy   . Non-alcoholic fatty liver disease    Past Surgical History:  Procedure Laterality Date  . COLONOSCOPY WITH PROPOFOL N/A 11/18/2018   Procedure: COLONOSCOPY WITH PROPOFOL;  Surgeon: Manya Silvas, MD;  Location:  [facility] ENDOSCOPY;  Service: Endoscopy;  Laterality: N/A;  . ESOPHAGOGASTRODUODENOSCOPY (EGD) WITH PROPOFOL N/A 11/18/2018   Procedure: ESOPHAGOGASTRODUODENOSCOPY (EGD) WITH PROPOFOL;  Surgeon: Manya Silvas, MD;  Location: [facility] ENDOSCOPY;  Service: Endoscopy;  Laterality: N/A;  . pain pump implant and removal Bilateral    Dec. 21 2019  . SPINE SURGERY     Multiple surgeries, initial 2001, thoracic disc repair and recurrent revisions.  . THROAT SURGERY     Social History   Socioeconomic History  . Marital status: Married    Spouse name: Not on file  . Number of children: Not on file  . Years of education: 1 yr of college  . Highest education level: Not on file  Occupational History  . Occupation: Disabled  Tobacco Use  . Smoking status: Former Smoker    Types: Cigarettes    Start date: 08/29/1979    Quit date: 11/04/2010    Years since quitting: 9.7  . Smokeless tobacco: Former Systems developer    Quit date: 11/19/2010  . Tobacco comment: 1 to 1.5 packs per day  Vaping Use  . Vaping Use: Never used  Substance and Sexual Activity  . Alcohol use: No    Alcohol/week: 0.0 standard drinks  . Drug use: No  . Sexual activity: Not Currently  Other Topics Concern  . Not on file  Social History Narrative  . Not on file   Social Determinants of Health   Financial Resource Strain:   . Difficulty of Paying Living Expenses: Not on file  Food Insecurity:   . Worried About Charity fundraiser in the Last Year: Not on file  . Ran Out of Food in the Last Year: Not on file  Transportation Needs:   . Lack of Transportation (Medical): Not on file  . Lack of Transportation (Non-Medical): Not on file  Physical Activity:   . Days of Exercise per Week: Not on file  . Minutes of Exercise per Session: Not on file  Stress:   . Feeling of Stress : Not on file  Social Connections:   . Frequency of Communication with Friends and Family: Not on file  . Frequency of Social Gatherings with Friends and  Family: Not on file  . Attends Religious Services: Not on file  . Active Member of Clubs or Organizations: Not on file  . Attends Archivist Meetings: Not on file  . Marital Status: Not on file  Intimate Partner Violence:   . Fear of Current or Ex-Partner: Not on file  . Emotionally Abused: Not on file  . Physically Abused: Not on file  . Sexually Abused: Not on file   Family History  Problem Relation Age of Onset  . Depression Mother   . Anxiety disorder Mother   . Diabetes Mother   . Heart failure Mother   . Heart attack Father   . Diabetes Sister   . Obesity Sister   . Drug abuse Brother   . Alcohol abuse Brother   . Depression  Sister   . Anxiety disorder Sister   . Drug abuse Sister   . Anxiety disorder Sister   . Seizures Sister   . Diabetes Sister   . Depression Sister    Current Outpatient Medications on File Prior to Visit  Medication Sig  . acetaminophen (TYLENOL) 500 MG tablet Take by mouth.  . Alcohol Swabs (B-D SINGLE USE SWABS REGULAR) PADS Check blood sugar 2 times daily.  Marland Kitchen ALPRAZolam (XANAX) 1 MG tablet Take 1 mg by mouth 3 (three) times daily.  Marland Kitchen aspirin EC 81 MG tablet Take 81 mg by mouth daily.  Marland Kitchen atorvastatin (LIPITOR) 10 MG tablet TAKE 1 TABLET BY MOUTH DAILY AT 2 PM  . b complex vitamins capsule Take by mouth.  . Blood Glucose Calibration (TRUE METRIX LEVEL 1) Low SOLN Check blood sugar 2 times daily  . Blood Glucose Monitoring Suppl (TRUE METRIX AIR GLUCOSE METER) w/Device KIT Check blood sugar 2 times daily  . buPROPion (WELLBUTRIN XL) 300 MG 24 hr tablet Take 1 tablet (300 mg total) by mouth daily.  . butalbital-acetaminophen-caffeine (FIORICET) 50-325-40 MG tablet Take 1 tablet by mouth daily as needed for headache.  . Cholecalciferol 25 MCG (1000 UT) tablet Take 1,000 Units by mouth.   Marland Kitchen CINNAMON PO Take by mouth.  . Coenzyme Q10 200 MG TABS Take 200 mg by mouth daily.   Marland Kitchen desoximetasone (TOPICORT) 0.25 % cream Apply 1 application  topically 2 (two) times daily as needed.  . diclofenac sodium (VOLTAREN) 1 % GEL Apply topically.  . dicyclomine (BENTYL) 20 MG tablet TAKE 1 TABLET (20 MG TOTAL) BY MOUTH EVERY 6 (SIX) HOURS  . DULoxetine (CYMBALTA) 30 MG capsule Take 1 capsule (30 mg total) by mouth daily. Take with existing $RemoveBefor'60mg'jLGSHSQCWtpe$  dose = total daily dose $RemoveBe'90mg'ZNkwMcxHa$   . DULoxetine (CYMBALTA) 60 MG capsule Take 1 capsule (60 mg total) by mouth daily. Take with existing $RemoveBefor'30mg'OfHUTZWwMmXp$  dose = total daily dose $RemoveBe'90mg'ThQiYpMcX$   . fluticasone (FLONASE) 50 MCG/ACT nasal spray Place 2 sprays into both nostrils daily.  Marland Kitchen gabapentin (NEURONTIN) 300 MG capsule Take 3 capsules by mouth 3 (three) times daily.  . hydrochlorothiazide (HYDRODIURIL) 25 MG tablet Take 1 tablet (25 mg total) by mouth daily.  Marland Kitchen lisinopril (ZESTRIL) 5 MG tablet Take 1 tablet (5 mg total) by mouth daily.  . meloxicam (MOBIC) 7.5 MG tablet Take 7.5 mg by mouth 2 (two) times daily.   . metFORMIN (GLUCOPHAGE) 1000 MG tablet Take 0.5 tablets (500 mg total) by mouth 2 (two) times daily with a meal.  . Multiple Vitamin (MULTIVITAMIN) tablet Take 1 tablet by mouth daily.  Marland Kitchen NEEDLE, DISP, 18 G (B-D DISP NEEDLE TW 18GX1") 18G X 1" MISC For use to draw up medication into the syringe  . NEEDLE, DISP, 21 G (BD SAFETYGLIDE SHIELDED NEEDLE) 21G X 1-1/2" MISC For use of administering medication in the IM location  . Omega-3 Fatty Acids (FISH OIL) 1200 MG CAPS Take 1 capsule (1,200 mg total) by mouth daily.  Marland Kitchen OZEMPIC, 1 MG/DOSE, 2 MG/1.5ML SOPN Inject 1 mg into the skin once a week.  . pantoprazole (PROTONIX) 40 MG tablet Take 40 mg by mouth 2 (two) times daily.  . propranolol ER (INDERAL LA) 60 MG 24 hr capsule Take 1 capsule by mouth daily.   . Syringe, Disposable, 3 ML MISC Use syringes as directed by physician  . tamsulosin (FLOMAX) 0.4 MG CAPS capsule Take 1 capsule (0.4 mg total) by mouth daily.  Marland Kitchen testosterone cypionate (DEPOTESTOSTERONE  CYPIONATE) 200 MG/ML injection INJECT 1 ML (200 MG TOTAL) INTO THE  MUSCLE EVERY 14 (FOURTEEN) DAYS.  Marland Kitchen tiZANidine (ZANAFLEX) 4 MG tablet 1 tablet twice daily, May take an additional 1.5 tablet at bedtime  . traMADol (ULTRAM) 50 MG tablet Take by mouth every 6 (six) hours as needed.  . TRUE METRIX BLOOD GLUCOSE TEST test strip Check blood sugar 2 times a day  . TRUEplus Lancets 33G MISC Check blood sugar 2 times a day   No current facility-administered medications on file prior to visit.    Review of Systems  Constitutional: Negative for activity change, appetite change, chills, diaphoresis, fatigue and fever.  HENT: Negative for congestion and hearing loss.   Eyes: Negative for visual disturbance.  Respiratory: Negative for cough, chest tightness, shortness of breath and wheezing.   Cardiovascular: Negative for chest pain, palpitations and leg swelling.  Gastrointestinal: Negative for abdominal pain, constipation, diarrhea, nausea and vomiting.  Endocrine: Negative for cold intolerance.  Genitourinary: Negative for dysuria, frequency and hematuria.  Musculoskeletal: Positive for arthralgias and back pain. Negative for neck pain.  Skin: Negative for rash.  Allergic/Immunologic: Negative for environmental allergies.  Neurological: Positive for numbness. Negative for dizziness, weakness, light-headedness and headaches.  Hematological: Negative for adenopathy.  Psychiatric/Behavioral: Negative for behavioral problems, dysphoric mood and sleep disturbance. The patient is not nervous/anxious.    Per HPI unless specifically indicated above      Objective:    BP 100/64   Pulse 72   Temp (!) 97.3 F (36.3 C) (Temporal)   Resp 16   Ht $R'5\' 10"'pu$  (1.778 m)   Wt 213 lb 9.6 oz (96.9 kg)   SpO2 99%   BMI 30.65 kg/m   Wt Readings from Last 3 Encounters:  07/25/20 213 lb 9.6 oz (96.9 kg)  06/28/20 202 lb (91.6 kg)  01/19/20 213 lb (96.6 kg)    Physical Exam Vitals and nursing note reviewed.  Constitutional:      General: He is not in acute distress.     Appearance: He is well-developed. He is not diaphoretic.     Comments: Well-appearing, comfortable, cooperative  HENT:     Head: Normocephalic and atraumatic.  Eyes:     General:        Right eye: No discharge.        Left eye: No discharge.     Conjunctiva/sclera: Conjunctivae normal.     Pupils: Pupils are equal, round, and reactive to light.  Neck:     Thyroid: No thyromegaly.     Vascular: No carotid bruit.  Cardiovascular:     Rate and Rhythm: Normal rate and regular rhythm.     Heart sounds: Normal heart sounds. No murmur heard.   Pulmonary:     Effort: Pulmonary effort is normal. No respiratory distress.     Breath sounds: Normal breath sounds. No wheezing or rales.  Abdominal:     General: Bowel sounds are normal. There is no distension.     Palpations: Abdomen is soft. There is no mass.     Tenderness: There is no abdominal tenderness.  Musculoskeletal:        General: No tenderness. Normal range of motion.     Cervical back: Normal range of motion and neck supple.     Right lower leg: No edema.     Left lower leg: No edema.     Comments: Upper / Lower Extremities: - Normal muscle tone, strength bilateral upper extremities 5/5, lower extremities 5/5  Lymphadenopathy:     Cervical: No cervical adenopathy.  Skin:    General: Skin is warm and dry.     Findings: No erythema or rash.  Neurological:     Mental Status: He is alert and oriented to person, place, and time.     Comments: Distal sensation intact to light touch all extremities  Psychiatric:        Behavior: Behavior normal.     Comments: Well groomed, good eye contact, normal speech and thoughts         Diabetic Foot Exam - Simple   Simple Foot Form Diabetic Foot exam was performed with the following findings: Yes 07/25/2020 11:50 AM  Visual Inspection See comments: Yes Sensation Testing See comments: Yes Pulse Check Posterior Tibialis and Dorsalis pulse intact bilaterally: Yes Comments Bilateral  feet with callus formation heels and forefoot and great toe. No ulceration. Reduced sensation to monofilament is stable bilateral heel and forefoot great toe, has some sensation mid foot arch and dorsal reduced on Left      Results for orders placed or performed in visit on 07/25/20  POCT glycosylated hemoglobin (Hb A1C)  Result Value Ref Range   Hemoglobin A1C 5.5 4.0 - 5.6 %      Assessment & Plan:   Problem List Items Addressed This Visit    Type 2 diabetes, controlled, with neuropathy ([place])    Significantly improved DM control now on Ozempic still On 0.5 dose still controlled, no longer on 1 Weight loss on med Resolved hyperglycemia, more stabilized sugar now Complications - peripheral neuropathy (also secondary to multiple back surgeries, with nerve damage), Hyperlipidemia, GERD  Plan:  1. Continue Ozempic 0.5mg  weekly inj - sample given today, he is waiting on future order of Ozempic 1mg  if financial assistance is arranged.  - REDUCE Metformin from 1000mg  to 500mg  BID.  2. Encourage improved lifestyle - low carb, low sugar diet, reduce portion size, continue improving regular exercise 3. Check CBG, bring log to next visit for review 4. Continue ACEi, Statin Follow-up 6 months      Relevant Medications   metFORMIN (GLUCOPHAGE) 1000 MG tablet   Other Relevant Orders   POCT glycosylated hemoglobin (Hb A1C) (Completed)   Moderate episode of recurrent major depressive disorder (HCC)    Stable, controlled chronic mood disorder Followed by Psychiatry CBC On Duloxetine, Wellbutrin, Alprazolam      Essential (primary) hypertension    Normal BP Complication with history of CAD Off Amlodipine   Plan:  1. Continue Lisinopril 5mg  daily, HCTZ 25mg  2. Encourage improved lifestyle - low sodium diet, regular exercise 3. Continue monitor BP outside office, bring readings to next visit, if persistently >140/90 or new symptoms notify office sooner       Other Visit Diagnoses      Annual physical exam    -  Primary      Updated Health Maintenance information - Return for Flu Shot Reviewed recent lab results with patient Encouraged improvement to lifestyle with diet and exercise - Goal of weight loss  forward to [place] about Ozempic 1.0   No orders of the defined types were placed in this encounter.     Follow up plan: Return in about 6 months (around 01/22/2021) for 6 month DM A1c.  Dale [name], [facility] 07/25/2020, 11:36 AM

## 2020-07-26 NOTE — Assessment & Plan Note (Addendum)
Normal BP Complication with history of CAD Off Amlodipine   Plan:  1. Continue Lisinopril 5mg  daily, HCTZ 25mg  2. Encourage improved lifestyle - low sodium diet, regular exercise 3. Continue monitor BP outside office, bring readings to next visit, if persistently >140/90 or new symptoms notify office sooner

## 2020-07-26 NOTE — Assessment & Plan Note (Signed)
Significantly improved DM control now on Ozempic still On 0.5 dose still controlled, no longer on 1 Weight loss on med Resolved hyperglycemia, more stabilized sugar now Complications - peripheral neuropathy (also secondary to multiple back surgeries, with nerve damage), Hyperlipidemia, GERD  Plan:  1. Continue Ozempic 0.5mg  weekly inj - sample given today, he is waiting on future order of Ozempic 1mg  if financial assistance is arranged.  - REDUCE Metformin from 1000mg  to 500mg  BID.  2. Encourage improved lifestyle - low carb, low sugar diet, reduce portion size, continue improving regular exercise 3. Check CBG, bring log to next visit for review 4. Continue ACEi, Statin Follow-up 6 months

## 2020-07-26 NOTE — Assessment & Plan Note (Signed)
Stable, controlled chronic mood disorder Followed by Psychiatry [facility] On Duloxetine, Wellbutrin, Alprazolam

## 2020-08-07 ENCOUNTER — Other Ambulatory Visit: Payer: Self-pay | Admitting: Family Medicine

## 2020-08-07 ENCOUNTER — Ambulatory Visit (INDEPENDENT_AMBULATORY_CARE_PROVIDER_SITE_OTHER): Payer: Medicare HMO | Admitting: Pharmacist

## 2020-08-07 DIAGNOSIS — E1140 Type 2 diabetes mellitus with diabetic neuropathy, unspecified: Secondary | ICD-10-CM

## 2020-08-07 DIAGNOSIS — I10 Essential (primary) hypertension: Secondary | ICD-10-CM

## 2020-08-07 NOTE — Patient Instructions (Signed)
Thank you allowing the Chronic Care Management Team to be a part of your care! It was a pleasure speaking with you today!     CCM (Chronic Care Management) Team    Dale Larsson RN, MSN, CCM Nurse Care Coordinator  [phone]   Dale [name] PharmD  Clinical Pharmacist  [phone]   Dale Fried LCSW Clinical Social Worker [phone]  Visit Information  Goals Addressed            This Visit's Progress    PharmD - Medication Assistance       CARE PLAN ENTRY (see longitudinal plan of care for additional care plan information)  Current Barriers:   Financial Barriers in complicated patient with multiple medical conditions including T2DM, HTN, peripheral neuropathy, depression, GERD, tremors and chronic pain; patient has [employer] and reports copay for Ozempic is cost prohibitive at this time as he is currently in the coverage gap o Controlled T2DM, latest A1C: o Lab Results o  Component o Value o Date o   o HGBA1C o 5.5 o 07/25/2020    Pharmacist Clinical Goal(s):   Over the next 30 days, patient will work with PharmD and providers to relieve medication access concerns  Interventions:  Received coordination of care message from PCP o Office visit on 9/21 - A1C: 5.5%. Patient advised to continue Ozempic 0.5 mg weekly (sample received in office) and reduce metformin dose from 1000 to 500 BID  Collaborated with [facility] CPhT Dale [name] - advises patient assistance application not received back from patient  Follow up with patient regarding medication assistance. Patient confirms mailed [employer] patient assistance application and supporting documents back to [facility] CPhT in provided envelope ~1 month ago o Patient requests [facility] CPhT resend patient assistance program paperwork  - Message sent to [place] today  Follow up regarding blood sugar control and monitoring o Reports taking: - Metformin 1000 mg - 1/2 tablet (500 mg) twice daily - Ozempic 0.5  mg once weekly (reports using 0.5 mg weekly dose as provided with these samples from office)  Encourage patient to continue to follow up with office regarding availability of samples while continue to apply for assistance from [place] recent morning fasting CBGs running in 110s-120s  - Reports fasting CBGs running in 80s-90s prior to metformin dose decrease  Denies any s/s low blood sugar or adverse effects with higher dose of metformin - Reports interested in restarting previous dose of metformin (1000 mg twice daily) until able to resume Ozempic 1 mg weekly for previous tighter blood sugar control  Will collaborate with PCP o Have counseled on importance of nutrition and exercise in blood sugar control  Discuss importance of blood pressure control and monitoring o Note patient has home upper arm BP monitor o Note patient taking: - HCTZ 25 mg daily - Lisinopril 5 mg daily - Propranolol ER 60 mg daily o Reports recent resting BP readings have been good - Last checked on 10/2: 124/86, HR 77 - Reports did have an episode last week where he stood up [place] and became dizzy. Reports checked his BP at the time and it was "low", but unable to recall specific reading  Advise patient to make positional changes slowly: Counsel regarding taking his time when going from laying down to sitting; sitting to standing; and standing to walking.  Advise patient to call provider office for further episodes of dizziness o Has reported tremor significantly improved since Neurologist started him on propranolol  ER o Encourage patient to continue home BP monitoring, keep log of results, bring record to medical appointments and call for readings outside of established parameters or new symptoms  Coordination of care: Reports has established care with new psychiatrist: Dr. Delorise [name] ([place])  [place] care team collaboration (see longitudinal plan of  care)  Patient Self Care Activities:   Patient will provide necessary portions of application   Please see past updates related to this goal by clicking on the "Past Updates" button in the selected goal         Patient verbalizes understanding of instructions provided today.   Telephone follow up appointment with care management team member scheduled for: 11/3 at 11:30 am  Dale [name], PharmD, [place] [phone]

## 2020-08-07 NOTE — Chronic Care Management (AMB) (Signed)
Chronic Care Management   Follow Up Note   08/07/2020 Name: Dale [name]. MRN: [id number] DOB: 1961/07/28  Referred by: Dale Hauser, DO Reason for referral : Chronic Care Management (Patient Phone Call)   Leanard [name]. is a 59 y.o. year old male who is a primary care patient of Dale Hauser, DO. The CCM team was consulted for assistance with chronic disease management and care coordination needs.    I reached out to Dale [name]. by phone today.   Review of patient status, including review of consultants reports, relevant laboratory and other test results, and collaboration with appropriate care team members and the patient's provider was performed as part of comprehensive patient evaluation and provision of chronic care management services.    SDOH (Social Determinants of Health) assessments performed: No See Care Plan activities for detailed interventions related to SDOH)   Objective  Lab Results  Component Value Date   HGBA1C 5.5 07/25/2020   Last eGFR (from shared record): ~ 57 on 04/27/20 Latest Vit B12 result (from shared record): 860 pg/mL on 06/01/20    Outpatient Encounter Medications as of 08/07/2020  Medication Sig Note  . metFORMIN (GLUCOPHAGE) 1000 MG tablet Take 0.5 tablets (500 mg total) by mouth 2 (two) times daily with a meal.   . OZEMPIC, 1 MG/DOSE, 2 MG/1.5ML SOPN Inject 1 mg into the skin once a week. 07/05/2020: Taking Ozempic 0.5 mg weekly (samples)  . acetaminophen (TYLENOL) 500 MG tablet Take by mouth.   . Alcohol Swabs (B-D SINGLE USE SWABS REGULAR) PADS Check blood sugar 2 times daily.   Marland Kitchen ALPRAZolam (XANAX) 1 MG tablet Take 1 mg by mouth 3 (three) times daily.   Marland Kitchen aspirin EC 81 MG tablet Take 81 mg by mouth daily.   Marland Kitchen atorvastatin (LIPITOR) 10 MG tablet TAKE 1 TABLET BY MOUTH DAILY AT 2 PM   . b complex vitamins capsule Take by mouth.   . Blood Glucose Calibration (TRUE METRIX LEVEL 1) Low SOLN Check blood  sugar 2 times daily   . Blood Glucose Monitoring Suppl (TRUE METRIX AIR GLUCOSE METER) w/Device KIT Check blood sugar 2 times daily   . buPROPion (WELLBUTRIN XL) 300 MG 24 hr tablet Take 1 tablet (300 mg total) by mouth daily.   . butalbital-acetaminophen-caffeine (FIORICET) 50-325-40 MG tablet Take 1 tablet by mouth daily as needed for headache.   . Cholecalciferol 25 MCG (1000 UT) tablet Take 1,000 Units by mouth.    Marland Kitchen CINNAMON PO Take by mouth.   . Coenzyme Q10 200 MG TABS Take 200 mg by mouth daily.    Marland Kitchen desoximetasone (TOPICORT) 0.25 % cream Apply 1 application topically 2 (two) times daily as needed.   . diclofenac sodium (VOLTAREN) 1 % GEL Apply topically.   . dicyclomine (BENTYL) 20 MG tablet TAKE 1 TABLET (20 MG TOTAL) BY MOUTH EVERY 6 (SIX) HOURS 06/12/2020: As needed  . DULoxetine (CYMBALTA) 30 MG capsule Take 1 capsule (30 mg total) by mouth daily. Take with existing $RemoveBefor'60mg'GLbXPlIqQZmA$  dose = total daily dose $RemoveBe'90mg'wjaHLcgXH$    . DULoxetine (CYMBALTA) 60 MG capsule Take 1 capsule (60 mg total) by mouth daily. Take with existing $RemoveBefor'30mg'vcDgQuxHgwHL$  dose = total daily dose $RemoveBe'90mg'aIBOGMatn$    . fluticasone (FLONASE) 50 MCG/ACT nasal spray Place 2 sprays into both nostrils daily.   Marland Kitchen gabapentin (NEURONTIN) 300 MG capsule Take 3 capsules by mouth 3 (three) times daily.   . hydrochlorothiazide (HYDRODIURIL) 25 MG tablet Take 1 tablet (  25 mg total) by mouth daily.   Marland Kitchen lisinopril (ZESTRIL) 5 MG tablet Take 1 tablet (5 mg total) by mouth daily.   . meloxicam (MOBIC) 7.5 MG tablet Take 7.5 mg by mouth 2 (two) times daily.    . Multiple Vitamin (MULTIVITAMIN) tablet Take 1 tablet by mouth daily.   Marland Kitchen NEEDLE, DISP, 18 G (B-D DISP NEEDLE TW 18GX1") 18G X 1" MISC For use to draw up medication into the syringe   . NEEDLE, DISP, 21 G (BD SAFETYGLIDE SHIELDED NEEDLE) 21G X 1-1/2" MISC For use of administering medication in the IM location   . Omega-3 Fatty Acids (FISH OIL) 1200 MG CAPS Take 1 capsule (1,200 mg total) by mouth daily.   . pantoprazole  (PROTONIX) 40 MG tablet Take 40 mg by mouth 2 (two) times daily.   . propranolol ER (INDERAL LA) 60 MG 24 hr capsule Take 1 capsule by mouth daily.    . Syringe, Disposable, 3 ML MISC Use syringes as directed by physician   . tamsulosin (FLOMAX) 0.4 MG CAPS capsule Take 1 capsule (0.4 mg total) by mouth daily.   Marland Kitchen testosterone cypionate (DEPOTESTOSTERONE CYPIONATE) 200 MG/ML injection INJECT 1 ML (200 MG TOTAL) INTO THE MUSCLE EVERY 14 (FOURTEEN) DAYS.   Marland Kitchen tiZANidine (ZANAFLEX) 4 MG tablet 1 tablet twice daily, May take an additional 1.5 tablet at bedtime   . traMADol (ULTRAM) 50 MG tablet Take by mouth every 6 (six) hours as needed.   . TRUE METRIX BLOOD GLUCOSE TEST test strip Check blood sugar 2 times a day   . TRUEplus Lancets 33G MISC Check blood sugar 2 times a day    No facility-administered encounter medications on file as of 08/07/2020.    Goals Addressed            This Visit's Progress   . PharmD - Medication Assistance       CARE PLAN ENTRY (see longitudinal plan of care for additional care plan information)  Current Barriers:  . Financial Barriers in complicated patient with multiple medical conditions including T2DM, HTN, peripheral neuropathy, depression, GERD, tremors and chronic pain; patient has [employer] and reports copay for Ozempic is cost prohibitive at this time as he is currently in the coverage gap o Controlled T2DM, latest A1C: o Lab Results o  Component o Value o Date o   o HGBA1C o 5.5 o 07/25/2020    Pharmacist Clinical Goal(s):  Marland Kitchen Over the next 30 days, patient will work with PharmD and providers to relieve medication access concerns  Interventions: . Received coordination of care message from PCP o Office visit on 9/21 - A1C: 5.5%. Patient advised to continue Ozempic 0.5 mg weekly (sample received in office) and reduce metformin dose from 1000 to 500 BID . Collaborated with [facility] Jill Simcox - advises patient assistance application  not received back from patient . Follow up with patient regarding medication assistance. Patient confirms mailed [employer] patient assistance application and supporting documents back to [place] in provided envelope ~1 month ago o Patient requests [facility] resend patient assistance program paperwork  - Message sent to [place] today . Follow up regarding blood sugar control and monitoring o Reports taking: - Metformin 1000 mg - 1/2 tablet (500 mg) twice daily - Ozempic 0.5 mg once weekly (reports using 0.5 mg weekly dose as provided with these samples from office) . Encourage patient to continue to follow up with office regarding availability of samples while continue  to apply for assistance from [employer] o Reports recent morning fasting CBGs running in 110s-120s  - Reports fasting CBGs running in 80s-90s prior to metformin dose decrease . Denies any s/s low blood sugar or adverse effects with higher dose of metformin - Reports interested in restarting previous dose of metformin (1000 mg twice daily) until able to resume Ozempic 1 mg weekly for previous tighter blood sugar control . Will collaborate with PCP o Have counseled on importance of nutrition and exercise in blood sugar control . Discuss importance of blood pressure control and monitoring o Note patient has home upper arm BP monitor o Note patient taking: - HCTZ 25 mg daily - Lisinopril 5 mg daily - Propranolol ER 60 mg daily o Reports recent resting BP readings have been good - Last checked on 10/2: 124/86, HR 77 - Reports did have an episode last week where he stood up [place] and became dizzy. Reports checked his BP at the time and it was "low", but unable to recall specific reading . Advise patient to make positional changes slowly: Counsel regarding taking his time when going from laying down to sitting; sitting to standing; and standing to walking. . Advise patient to call provider office for further episodes of  dizziness o Has reported tremor significantly improved since Neurologist started him on propranolol ER o Encourage patient to continue home BP monitoring, keep log of results, bring record to medical appointments and call for readings outside of established parameters or new symptoms . Coordination of care: Reports has established care with new psychiatrist: Dr. Delorise Jackson ([place]) . Inter-disciplinary care team collaboration (see longitudinal plan of care)  Patient Self Care Activities:  . Patient will provide necessary portions of application   Please see past updates related to this goal by clicking on the "Past Updates" button in the selected goal         Plan  Telephone follow up appointment with care management team member scheduled for: 11/3 at 11:30 am  Harlow Asa, PharmD, [place] [phone]

## 2020-08-08 ENCOUNTER — Other Ambulatory Visit: Payer: Self-pay | Admitting: Pharmacy Technician

## 2020-08-08 NOTE — Patient Outreach (Signed)
[place] [facility]) Care Management  08/08/2020  Daaiel Starlin. [birth year] [id number]  Received in basket message from embedded [facility] Harlow Asa to re mail out patient's application for [employer] with [employer].  Application re mailed to patient today.  Will follow up with patient in 5-10 business days to inquire if he has received it and mailed it back.  Tarin Navarez P. Legion Discher, [place]  [phone]

## 2020-08-09 ENCOUNTER — Ambulatory Visit: Payer: Self-pay | Admitting: Pharmacist

## 2020-08-09 DIAGNOSIS — E1140 Type 2 diabetes mellitus with diabetic neuropathy, unspecified: Secondary | ICD-10-CM

## 2020-08-09 NOTE — Patient Instructions (Signed)
Thank you allowing the Chronic Care Management Team to be a part of your care! It was a pleasure speaking with you today!     CCM (Chronic Care Management) Team    Noreene Larsson RN, MSN, CCM Nurse Care Coordinator  [phone]   Harlow Asa PharmD  Clinical Pharmacist  [phone]   Eula Fried LCSW Clinical Social Worker [phone]  Visit Information  Goals Addressed            This Visit's Progress   . PharmD - Medication Assistance       CARE PLAN ENTRY (see longitudinal plan of care for additional care plan information)  Current Barriers:  . Financial Barriers in complicated patient with multiple medical conditions including T2DM, HTN, peripheral neuropathy, depression, GERD, tremors and chronic pain; patient has [employer] and reports copay for Ozempic is cost prohibitive at this time as he is currently in the coverage gap o Controlled T2DM, latest A1C: o Lab Results o  Component o Value o Date o   o HGBA1C o 5.5 o 07/25/2020    Pharmacist Clinical Goal(s):  Marland Kitchen Over the next 30 days, patient will work with PharmD and providers to relieve medication access concerns  Interventions: . Collaborate with PCP regarding patient's diabetes medication management. Request provider consider increasing patient's metformin dose back to 1000 mg twice daily as patient preference for tighter blood sugar control. o Provider agrees and Rx updated in chart . Follow up with patient today and advise him that PCP stated okay for him to resume metformin 1000 mg twice daily for now while on Ozempic 0.5 mg weekly . Follow up with patient regarding medication assistance. Advise that [place] resent (mail) [employer] patient assistance application to him yesterday . Inter-disciplinary care team collaboration (see longitudinal plan of care)  Patient Self Care Activities:  . Patient will provide necessary portions of application   Please see past updates related to  this goal by clicking on the "Past Updates" button in the selected goal         Patient verbalizes understanding of instructions provided today.   Telephone follow up appointment with care management team member scheduled for: 11/3 at 11:30 am  Harlow Asa, PharmD, [place] [phone]

## 2020-08-09 NOTE — Chronic Care Management (AMB) (Signed)
Chronic Care Management   Follow Up Note   08/09/2020 Name: Dale [name]. MRN: [id number] DOB: [birth year]  Referred by: Olin Hauser, DO Reason for referral : Chronic Care Management (Patient Phone Call)   Warwick Nick. is a 59 y.o. year old male who is a primary care patient of Olin Hauser, DO. The CCM team was consulted for assistance with chronic disease management and care coordination needs.    I reached out to [name] by phone today.   Review of patient status, including review of consultants reports, relevant laboratory and other test results, and collaboration with appropriate care team members and the patient's provider was performed as part of comprehensive patient evaluation and provision of chronic care management services.    SDOH (Social Determinants of Health) assessments performed: No See Care Plan activities for detailed interventions related to [facility])     Outpatient Encounter Medications as of 08/09/2020  Medication Sig Note  . acetaminophen (TYLENOL) 500 MG tablet Take by mouth.   . Alcohol Swabs (B-D SINGLE USE SWABS REGULAR) PADS Check blood sugar 2 times daily.   Marland Kitchen ALPRAZolam (XANAX) 1 MG tablet Take 1 mg by mouth 3 (three) times daily.   Marland Kitchen aspirin EC 81 MG tablet Take 81 mg by mouth daily.   Marland Kitchen atorvastatin (LIPITOR) 10 MG tablet TAKE 1 TABLET BY MOUTH DAILY AT 2 PM   . b complex vitamins capsule Take by mouth.   . Blood Glucose Calibration (TRUE METRIX LEVEL 1) Low SOLN Check blood sugar 2 times daily   . Blood Glucose Monitoring Suppl (TRUE METRIX AIR GLUCOSE METER) w/Device KIT Check blood sugar 2 times daily   . buPROPion (WELLBUTRIN XL) 300 MG 24 hr tablet Take 1 tablet (300 mg total) by mouth daily.   . butalbital-acetaminophen-caffeine (FIORICET) 50-325-40 MG tablet Take 1 tablet by mouth daily as needed for headache.   . Cholecalciferol 25 MCG (1000 UT) tablet Take 1,000 Units by mouth.    Marland Kitchen CINNAMON PO  Take by mouth.   . Coenzyme Q10 200 MG TABS Take 200 mg by mouth daily.    Marland Kitchen desoximetasone (TOPICORT) 0.25 % cream Apply 1 application topically 2 (two) times daily as needed.   . diclofenac sodium (VOLTAREN) 1 % GEL Apply topically.   . dicyclomine (BENTYL) 20 MG tablet TAKE 1 TABLET (20 MG TOTAL) BY MOUTH EVERY 6 (SIX) HOURS 06/12/2020: As needed  . DULoxetine (CYMBALTA) 30 MG capsule Take 1 capsule (30 mg total) by mouth daily. Take with existing $RemoveBefor'60mg'YwiSLiGZTvUe$  dose = total daily dose $RemoveBe'90mg'jLIulnNvA$    . DULoxetine (CYMBALTA) 60 MG capsule Take 1 capsule (60 mg total) by mouth daily. Take with existing $RemoveBefor'30mg'bHZrGqpziXhR$  dose = total daily dose $RemoveBe'90mg'gvQPlJnwX$    . fluticasone (FLONASE) 50 MCG/ACT nasal spray Place 2 sprays into both nostrils daily.   Marland Kitchen gabapentin (NEURONTIN) 300 MG capsule Take 3 capsules by mouth 3 (three) times daily.   . hydrochlorothiazide (HYDRODIURIL) 25 MG tablet Take 1 tablet (25 mg total) by mouth daily.   Marland Kitchen lisinopril (ZESTRIL) 5 MG tablet Take 1 tablet (5 mg total) by mouth daily.   . meloxicam (MOBIC) 7.5 MG tablet Take 7.5 mg by mouth 2 (two) times daily.    . metFORMIN (GLUCOPHAGE) 1000 MG tablet Take 1,000 mg by mouth 2 (two) times daily with a meal.   . Multiple Vitamin (MULTIVITAMIN) tablet Take 1 tablet by mouth daily.   Marland Kitchen NEEDLE, DISP, 18 G (B-D DISP  NEEDLE TW 18GX1") 18G X 1" MISC For use to draw up medication into the syringe   . NEEDLE, DISP, 21 G (BD SAFETYGLIDE SHIELDED NEEDLE) 21G X 1-1/2" MISC For use of administering medication in the IM location   . Omega-3 Fatty Acids (FISH OIL) 1200 MG CAPS Take 1 capsule (1,200 mg total) by mouth daily.   Marland Kitchen OZEMPIC, 1 MG/DOSE, 2 MG/1.5ML SOPN Inject 1 mg into the skin once a week. 07/05/2020: Taking Ozempic 0.5 mg weekly (samples)  . pantoprazole (PROTONIX) 40 MG tablet Take 40 mg by mouth 2 (two) times daily.   . propranolol ER (INDERAL LA) 60 MG 24 hr capsule Take 1 capsule by mouth daily.    . Syringe, Disposable, 3 ML MISC Use syringes as directed by  physician   . tamsulosin (FLOMAX) 0.4 MG CAPS capsule Take 1 capsule (0.4 mg total) by mouth daily.   Marland Kitchen testosterone cypionate (DEPOTESTOSTERONE CYPIONATE) 200 MG/ML injection INJECT 1 ML (200 MG TOTAL) INTO THE MUSCLE EVERY 14 (FOURTEEN) DAYS.   Marland Kitchen tiZANidine (ZANAFLEX) 4 MG tablet 1 tablet twice daily, May take an additional 1.5 tablet at bedtime   . traMADol (ULTRAM) 50 MG tablet Take by mouth every 6 (six) hours as needed.   . TRUE METRIX BLOOD GLUCOSE TEST test strip Check blood sugar 2 times a day   . TRUEplus Lancets 33G MISC Check blood sugar 2 times a day    No facility-administered encounter medications on file as of 08/09/2020.    Goals Addressed            This Visit's Progress   . PharmD - Medication Assistance       CARE PLAN ENTRY (see longitudinal plan of care for additional care plan information)  Current Barriers:  . Financial Barriers in complicated patient with multiple medical conditions including T2DM, HTN, peripheral neuropathy, depression, GERD, tremors and chronic pain; patient has [employer] and reports copay for Ozempic is cost prohibitive at this time as he is currently in the coverage gap o Controlled T2DM, latest A1C: o Lab Results o  Component o Value o Date o   o HGBA1C o 5.5 o 07/25/2020    Pharmacist Clinical Goal(s):  Marland Kitchen Over the next 30 days, patient will work with PharmD and providers to relieve medication access concerns  Interventions: . Collaborate with PCP regarding patient's diabetes medication management. Request provider consider increasing patient's metformin dose back to 1000 mg twice daily as patient preference for tighter blood sugar control. o Provider agrees and Rx updated in chart . Follow up with patient today and advise him that PCP stated okay for him to resume metformin 1000 mg twice daily for now while on Ozempic 0.5 mg weekly . Follow up with patient regarding medication assistance. Advise that [place] resent  (mail) [employer] patient assistance application to him yesterday . Inter-disciplinary care team collaboration (see longitudinal plan of care)  Patient Self Care Activities:  . Patient will provide necessary portions of application   Please see past updates related to this goal by clicking on the "Past Updates" button in the selected goal         Plan  Telephone follow up appointment with care management team member scheduled for: 11/3 at 11:30 am  Harlow Asa, PharmD, [place] [phone]

## 2020-08-11 ENCOUNTER — Other Ambulatory Visit: Payer: Self-pay | Admitting: Family Medicine

## 2020-08-11 DIAGNOSIS — G603 Idiopathic progressive neuropathy: Secondary | ICD-10-CM

## 2020-08-17 ENCOUNTER — Other Ambulatory Visit: Payer: Self-pay | Admitting: Pharmacy Technician

## 2020-08-17 DIAGNOSIS — R413 Other amnesia: Secondary | ICD-10-CM | POA: Diagnosis not present

## 2020-08-17 DIAGNOSIS — G250 Essential tremor: Secondary | ICD-10-CM | POA: Diagnosis not present

## 2020-08-17 DIAGNOSIS — E119 Type 2 diabetes mellitus without complications: Secondary | ICD-10-CM | POA: Diagnosis not present

## 2020-08-17 NOTE — Patient Outreach (Signed)
[place] [facility]) Care Management  08/17/2020  Dale [name]. 1961-01-11 [id number]   Unsuccessful call placed to patient regarding patient assistance application(s) for Ozempic with [employer] , HIPAA compliant voicemail left.   Was calling to inquire if patient has received the application that was mailed to him on 08/08/2020.  Follow up:  Will follow up with patient in 3-5 business days if call is not returned.  [name] P. [name], [place]  [phone]

## 2020-08-17 NOTE — Patient Outreach (Signed)
[place] [facility]) Care Management  08/17/2020  Daymen Hassebrock. Aug 19, 1961 [id number]  ADDENDUM  Incoming call received from patient in regards to [employer] application for [employer].  Spoke to patient, HIPAA verified.  Patient informs he received the 1st application and mailed it back weeks ago and does not understand why I have not received it. Informed patient the only reason I could offer was that the [employer]PS has been experiencing delays and has also slowed their service days.  Patient informs he did receive the 2nd application and is in the process of completing it and would mail it to be as soon as he could gather the proof of income from his accountant.  Will route note to close case to embedded [place] if information is not received back in 15 business days.  Tammara Massing P. Geralene Afshar, [place]  [phone]

## 2020-08-21 DIAGNOSIS — E119 Type 2 diabetes mellitus without complications: Secondary | ICD-10-CM | POA: Diagnosis not present

## 2020-08-22 ENCOUNTER — Other Ambulatory Visit: Payer: Self-pay | Admitting: Pharmacy Technician

## 2020-08-22 ENCOUNTER — Other Ambulatory Visit: Payer: Self-pay | Admitting: Family

## 2020-08-22 NOTE — Patient Outreach (Signed)
[place] [facility]) Care Management  08/22/2020  Dale [name]. 11-28-1960 [id number]   Received both patient and provider portion(s) of patient assistance application(s) for Ozempic. Faxed completed application and required documents into [employer].  Will follow up with company(ies) in 2-3 business days to check status of application(s).  Trevion Hoben P. [name], [place]  [phone]

## 2020-08-25 ENCOUNTER — Other Ambulatory Visit: Payer: Self-pay | Admitting: Pharmacy Technician

## 2020-08-25 NOTE — Patient Outreach (Signed)
Dale [name], Dale [name]) Care Management  08/25/2020  Dale [name]. Jul 08, 1961 [id number]  Care coordination call placed to [place] in regards to patient's Ozempic application.  Spoke to [place] who informed patient was DENIED as patient is over income.   Inquired if they accepted hardship letters or if we could appeal the decision. Hilda Blades informed they do not accept hardship letters. She informed the only way to appeal the decision would be if the reported income that the patient sent it did not correctly reflect his current income, then an appeal may be considered. Otherwise, patient would not be approved.  Will route note to embedded [place] to inform patient of his denial and to discuss potential other options.  Tanice Petre P. Barbara Ahart, [place]  [phone]

## 2020-08-26 ENCOUNTER — Other Ambulatory Visit: Payer: Self-pay | Admitting: Family Medicine

## 2020-08-26 DIAGNOSIS — F331 Major depressive disorder, recurrent, moderate: Secondary | ICD-10-CM

## 2020-08-26 NOTE — Telephone Encounter (Signed)
Requested Prescriptions  Pending Prescriptions Disp Refills  . buPROPion (WELLBUTRIN XL) 300 MG 24 hr tablet [Pharmacy Med Name: BUPROPION HYDROCHLORIDE ER (XL) 300 MG Tablet Extended Release 24 Hour] 90 tablet 1    Sig: TAKE 1 TABLET (300 MG TOTAL) BY MOUTH DAILY.     Psychiatry: Antidepressants - bupropion Passed - 08/26/2020  5:26 PM      Passed - Completed PHQ-2 or PHQ-9 in the last 360 days.      Passed - Last BP in normal range    BP Readings from Last 1 Encounters:  07/25/20 100/64         Passed - Valid encounter within last 6 months    Recent Outpatient Visits          1 month ago Annual physical exam   [facility] [place], Devonne Doughty, DO   7 months ago Type 2 diabetes, controlled, with neuropathy [facility])   [place], DO   1 year ago Annual physical exam   [place], DO   1 year ago Diabetic ulcer of toe of right foot associated with type 2 diabetes mellitus, with fat layer exposed [facility])   [facility] Olin Hauser, DO   1 year ago Type 2 diabetes, controlled, with neuropathy [facility])   [facility], Devonne Doughty, DO

## 2020-08-28 ENCOUNTER — Ambulatory Visit: Payer: Self-pay | Admitting: Pharmacist

## 2020-08-28 ENCOUNTER — Other Ambulatory Visit: Payer: Self-pay | Admitting: Family

## 2020-08-28 DIAGNOSIS — I10 Essential (primary) hypertension: Secondary | ICD-10-CM

## 2020-08-28 DIAGNOSIS — E1140 Type 2 diabetes mellitus with diabetic neuropathy, unspecified: Secondary | ICD-10-CM

## 2020-08-28 NOTE — Chronic Care Management (AMB) (Signed)
Chronic Care Management   Follow Up Note   08/28/2020 Name: Dale [name]. MRN: [id number] DOB: 28-Apr-1961  Referred by: Olin Hauser, DO Reason for referral : Chronic Care Management (Patient Phone Call)   Husam Hohn. is a 59 y.o. year old male who is a primary care patient of Olin Hauser, DO. The CCM team was consulted for assistance with chronic disease management and care coordination needs.    I reached out to [name] by phone today.   Review of patient status, including review of consultants reports, relevant laboratory and other test results, and collaboration with appropriate care team members and the patient's provider was performed as part of comprehensive patient evaluation and provision of chronic care management services.    SDOH (Social Determinants of Health) assessments performed: No See Care Plan activities for detailed interventions related to [facility])     Outpatient Encounter Medications as of 08/28/2020  Medication Sig Note  . metFORMIN (GLUCOPHAGE) 1000 MG tablet Take 1,000 mg by mouth 2 (two) times daily with a meal.   . propranolol (INNOPRAN XL) 80 MG 24 hr capsule Take 1 capsule (80 mg total) by mouth once daily   . acetaminophen (TYLENOL) 500 MG tablet Take by mouth.   . Alcohol Swabs (B-D SINGLE USE SWABS REGULAR) PADS Check blood sugar 2 times daily.   Marland Kitchen ALPRAZolam (XANAX) 1 MG tablet Take 1 mg by mouth 3 (three) times daily.   Marland Kitchen aspirin EC 81 MG tablet Take 81 mg by mouth daily.   Marland Kitchen atorvastatin (LIPITOR) 10 MG tablet TAKE 1 TABLET BY MOUTH DAILY AT 2 PM   . b complex vitamins capsule Take by mouth.   . Blood Glucose Calibration (TRUE METRIX LEVEL 1) Low SOLN Check blood sugar 2 times daily   . Blood Glucose Monitoring Suppl (TRUE METRIX AIR GLUCOSE METER) w/Device KIT Check blood sugar 2 times daily   . buPROPion (WELLBUTRIN XL) 300 MG 24 hr tablet TAKE 1 TABLET (300 MG TOTAL) BY MOUTH DAILY.   .  butalbital-acetaminophen-caffeine (FIORICET) 50-325-40 MG tablet Take 1 tablet by mouth daily as needed for headache.   . Cholecalciferol 25 MCG (1000 UT) tablet Take 1,000 Units by mouth.    Marland Kitchen CINNAMON PO Take by mouth.   . Coenzyme Q10 200 MG TABS Take 200 mg by mouth daily.    Marland Kitchen desoximetasone (TOPICORT) 0.25 % cream Apply 1 application topically 2 (two) times daily as needed.   . diclofenac sodium (VOLTAREN) 1 % GEL Apply topically.   . dicyclomine (BENTYL) 20 MG tablet TAKE 1 TABLET (20 MG TOTAL) BY MOUTH EVERY 6 (SIX) HOURS 06/12/2020: As needed  . DULoxetine (CYMBALTA) 30 MG capsule Take 1 capsule (30 mg total) by mouth daily. Take with existing $RemoveBefor'60mg'mwDtakHjIsPZ$  dose = total daily dose $RemoveBe'90mg'meYVAEnaC$    . DULoxetine (CYMBALTA) 60 MG capsule Take 1 capsule (60 mg total) by mouth daily. Take with existing $RemoveBefor'30mg'rtUCBOYilmAT$  dose = total daily dose $RemoveBe'90mg'lAmkjplsU$    . fluticasone (FLONASE) 50 MCG/ACT nasal spray Place 2 sprays into both nostrils daily.   Marland Kitchen gabapentin (NEURONTIN) 300 MG capsule Take 3 capsules by mouth 3 (three) times daily.   . hydrochlorothiazide (HYDRODIURIL) 25 MG tablet Take 1 tablet (25 mg total) by mouth daily.   Marland Kitchen lisinopril (ZESTRIL) 5 MG tablet Take 1 tablet (5 mg total) by mouth daily.   . meloxicam (MOBIC) 7.5 MG tablet Take 7.5 mg by mouth 2 (two) times daily.    Marland Kitchen  Multiple Vitamin (MULTIVITAMIN) tablet Take 1 tablet by mouth daily.   Marland Kitchen NEEDLE, DISP, 18 G (B-D DISP NEEDLE TW 18GX1") 18G X 1" MISC For use to draw up medication into the syringe   . NEEDLE, DISP, 21 G (BD SAFETYGLIDE SHIELDED NEEDLE) 21G X 1-1/2" MISC For use of administering medication in the IM location   . Omega-3 Fatty Acids (FISH OIL) 1200 MG CAPS Take 1 capsule (1,200 mg total) by mouth daily.   Marland Kitchen OZEMPIC, 1 MG/DOSE, 2 MG/1.5ML SOPN Inject 1 mg into the skin once a week. (Patient not taking: Reported on 08/28/2020) 07/05/2020: Taking Ozempic 0.5 mg weekly (samples)  . pantoprazole (PROTONIX) 40 MG tablet Take 40 mg by mouth 2 (two) times daily.     . propranolol ER (INDERAL LA) 60 MG 24 hr capsule Take 1 capsule by mouth daily.  (Patient not taking: Reported on 08/28/2020)   . Syringe, Disposable, 3 ML MISC Use syringes as directed by physician   . tamsulosin (FLOMAX) 0.4 MG CAPS capsule Take 1 capsule (0.4 mg total) by mouth daily.   Marland Kitchen testosterone cypionate (DEPOTESTOSTERONE CYPIONATE) 200 MG/ML injection INJECT 1 ML (200 MG TOTAL) INTO THE MUSCLE EVERY 14 (FOURTEEN) DAYS.   Marland Kitchen tiZANidine (ZANAFLEX) 4 MG tablet 1 tablet twice daily, May take an additional 1.5 tablet at bedtime   . traMADol (ULTRAM) 50 MG tablet Take by mouth every 6 (six) hours as needed.   . TRUE METRIX BLOOD GLUCOSE TEST test strip Check blood sugar 2 times a day   . TRUEplus Lancets 33G MISC Check blood sugar 2 times a day    No facility-administered encounter medications on file as of 08/28/2020.    Goals Addressed            This Visit's Progress   . PharmD - Medication Assistance       CARE PLAN ENTRY (see longitudinal plan of care for additional care plan information)  Current Barriers:  . Financial Barriers in complicated patient with multiple medical conditions including T2DM, HTN, peripheral neuropathy, depression, GERD, tremors and chronic pain; patient has [employer] and reports copay for Ozempic is cost prohibitive at this time as he is currently in the coverage gap o Controlled T2DM, latest A1C: o Lab Results o  Component o Value o Date o   o HGBA1C o 5.5 o 07/25/2020    Pharmacist Clinical Goal(s):  Marland Kitchen Over the next 30 days, patient will work with PharmD and providers to relieve medication access concerns  Interventions:  Perform chart review. Note patient seen by Neurologist on 10/14.   Provider increased patient's propranolol ER dose to 80 mg nightly for control of temor  Patient to monitor home BP  Provider placed referral for patient to [facility]   Received message from [facility] CPhT Jill Simcox.  Reports that she received assistance program application back from patient and submitted it to [employer]. However, reports patient's application was denied based on income limit and would not be reconsidered with hardship letter  Follow up with patient today to let him know that assistance application to [employer] was denied  Follow up regarding blood sugar control and monitoring  Reports taking:  Metformin 1000 mg - 1 tablet (1000 mg) twice daily  Last dose of Ozempic 0.5 mg once weekly (from samples) administered on 10/18  Reports will not be able to afford to pick up Ozempic Rx until next calendar year, but plans to work on lifestyle changes,  particularly on increasing his exercise, to help control blood sugars  Discuss role of nutrition and exercise in blood sugar control  Reports morning blood sugar today ~101  Discuss importance of blood pressure control and monitoring  Note patient has home upper arm BP monitor  Reports increased propranolol ER dose to 80 mg nightly as directed by Neurologist  Reports recent resting BP readings have been good  Last checked this morning: 116/86 (does not recall HR)  Denies further episodes of dizziness since has been taking positional changes slowly  Encourage patient to continue home BP monitoring, keep log of results, bring record to medical appointments and call for readings outside of established parameters or new symptoms  Inter-disciplinary care team collaboration (see longitudinal plan of care)  Patient Self Care Activities:  . Patient to attend medical appointments as scheduled  Please see past updates related to this goal by clicking on the "Past Updates" button in the selected goal         Plan  Telephone follow up appointment with care management team member scheduled for: 11/10 at 11:15 am  Harlow Asa, PharmD, [place] [phone]

## 2020-08-28 NOTE — Patient Instructions (Signed)
Thank you allowing the Chronic Care Management Team to be a part of your care! It was a pleasure speaking with you today!     CCM (Chronic Care Management) Team    Noreene Larsson RN, MSN, CCM Nurse Care Coordinator  [phone]   Harlow Asa PharmD  Clinical Pharmacist  [phone]   Eula Fried LCSW Clinical Social Worker [phone]  Visit Information  Goals Addressed            This Visit's Progress   . PharmD - Medication Assistance       CARE PLAN ENTRY (see longitudinal plan of care for additional care plan information)  Current Barriers:  . Financial Barriers in complicated patient with multiple medical conditions including T2DM, HTN, peripheral neuropathy, depression, GERD, tremors and chronic pain; patient has [employer] and reports copay for Ozempic is cost prohibitive at this time as he is currently in the coverage gap o Controlled T2DM, latest A1C: o Lab Results o  Component o Value o Date o   o HGBA1C o 5.5 o 07/25/2020    Pharmacist Clinical Goal(s):  Marland Kitchen Over the next 30 days, patient will work with PharmD and providers to relieve medication access concerns  Interventions:  Perform chart review. Note patient seen by Neurologist on 10/14.   Provider increased patient's propranolol ER dose to 80 mg nightly for control of temor  Patient to monitor home BP  Provider placed referral for patient to [facility]   Received message from  Simcox. Reports that she received assistance program application back from patient and submitted it to [employer]. However, reports patient's application was denied based on income limit and would not be reconsidered with hardship letter  Follow up with patient today to let him know that assistance application to [employer] was denied  Follow up regarding blood sugar control and monitoring  Reports taking:  Metformin 1000 mg - 1 tablet (1000 mg) twice daily  Last  dose of Ozempic 0.5 mg once weekly (from samples) administered on 10/18  Reports will not be able to afford to pick up Ozempic Rx until next calendar year, but plans to work on lifestyle changes, particularly on increasing his exercise, to help control blood sugars  Discuss role of nutrition and exercise in blood sugar control  Reports morning blood sugar today ~101  Discuss importance of blood pressure control and monitoring  Note patient has home upper arm BP monitor  Reports increased propranolol ER dose to 80 mg nightly as directed by Neurologist  Reports recent resting BP readings have been good  Last checked this morning: 116/86 (does not recall HR)  Denies further episodes of dizziness since has been taking positional changes slowly  Encourage patient to continue home BP monitoring, keep log of results, bring record to medical appointments and call for readings outside of established parameters or new symptoms  Inter-disciplinary care team collaboration (see longitudinal plan of care)  Patient Self Care Activities:  . Patient to attend medical appointments as scheduled  Please see past updates related to this goal by clicking on the "Past Updates" button in the selected goal         Patient verbalizes understanding of instructions provided today.   Telephone follow up appointment with care management team member scheduled for: 11/10 at 11:15 am  Harlow Asa, PharmD, [place] [phone]

## 2020-08-29 ENCOUNTER — Other Ambulatory Visit: Payer: Self-pay

## 2020-08-29 DIAGNOSIS — F419 Anxiety disorder, unspecified: Secondary | ICD-10-CM | POA: Diagnosis not present

## 2020-08-29 DIAGNOSIS — G894 Chronic pain syndrome: Secondary | ICD-10-CM | POA: Diagnosis not present

## 2020-08-29 DIAGNOSIS — F341 Dysthymic disorder: Secondary | ICD-10-CM | POA: Diagnosis not present

## 2020-08-29 NOTE — Telephone Encounter (Signed)
Patient called looking for refill on testosterone

## 2020-08-30 ENCOUNTER — Other Ambulatory Visit: Payer: Self-pay | Admitting: *Deleted

## 2020-08-31 NOTE — Addendum Note (Signed)
Addended by: Chrystie Nose on: 08/31/2020 08:26 AM   Modules accepted: Orders

## 2020-08-31 NOTE — Telephone Encounter (Signed)
Error

## 2020-09-01 ENCOUNTER — Telehealth: Payer: Self-pay

## 2020-09-01 MED ORDER — TESTOSTERONE CYPIONATE 200 MG/ML IM SOLN
200.0000 mg | INTRAMUSCULAR | 0 refills | Status: DC
Start: 2020-09-01 — End: 2021-03-05

## 2020-09-01 MED ORDER — SYRINGE (DISPOSABLE) 3 ML MISC
0 refills | Status: DC
Start: 2020-09-01 — End: 2021-06-25

## 2020-09-01 NOTE — Telephone Encounter (Signed)
Patient called asking for refill on testosterone

## 2020-09-06 ENCOUNTER — Telehealth: Payer: Self-pay

## 2020-09-06 DIAGNOSIS — G250 Essential tremor: Secondary | ICD-10-CM | POA: Diagnosis not present

## 2020-09-07 ENCOUNTER — Ambulatory Visit: Payer: Medicare HMO | Admitting: Licensed Clinical Social Worker

## 2020-09-07 DIAGNOSIS — F5101 Primary insomnia: Secondary | ICD-10-CM

## 2020-09-07 DIAGNOSIS — E1169 Type 2 diabetes mellitus with other specified complication: Secondary | ICD-10-CM

## 2020-09-07 DIAGNOSIS — F331 Major depressive disorder, recurrent, moderate: Secondary | ICD-10-CM

## 2020-09-07 DIAGNOSIS — E785 Hyperlipidemia, unspecified: Secondary | ICD-10-CM

## 2020-09-07 DIAGNOSIS — E1140 Type 2 diabetes mellitus with diabetic neuropathy, unspecified: Secondary | ICD-10-CM

## 2020-09-07 DIAGNOSIS — I10 Essential (primary) hypertension: Secondary | ICD-10-CM

## 2020-09-07 NOTE — Chronic Care Management (AMB) (Signed)
Chronic Care Management    Clinical Social Work Follow Up Note  09/07/2020 Name: Dale [name]. MRN: [id number] DOB: [birth year]  Dale [name]. is a 59 y.o. year old male who is a primary care patient of Olin Hauser, DO. The CCM team was consulted for assistance with Mental Health Counseling and Resources.   Review of patient status, including review of consultants reports, other relevant assessments, and collaboration with appropriate care team members and the patient's provider was performed as part of comprehensive patient evaluation and provision of chronic care management services.    SDOH (Social Determinants of Health) assessments performed: Yes    Outpatient Encounter Medications as of 09/07/2020  Medication Sig Note  . acetaminophen (TYLENOL) 500 MG tablet Take by mouth.   . Alcohol Swabs (B-D SINGLE USE SWABS REGULAR) PADS Check blood sugar 2 times daily.   Marland Kitchen ALPRAZolam (XANAX) 1 MG tablet Take 1 mg by mouth 3 (three) times daily.   Marland Kitchen aspirin EC 81 MG tablet Take 81 mg by mouth daily.   Marland Kitchen atorvastatin (LIPITOR) 10 MG tablet TAKE 1 TABLET BY MOUTH DAILY AT 2 PM   . b complex vitamins capsule Take by mouth.   . Blood Glucose Calibration (TRUE METRIX LEVEL 1) Low SOLN Check blood sugar 2 times daily   . Blood Glucose Monitoring Suppl (TRUE METRIX AIR GLUCOSE METER) w/Device KIT Check blood sugar 2 times daily   . buPROPion (WELLBUTRIN XL) 300 MG 24 hr tablet TAKE 1 TABLET (300 MG TOTAL) BY MOUTH DAILY.   . butalbital-acetaminophen-caffeine (FIORICET) 50-325-40 MG tablet Take 1 tablet by mouth daily as needed for headache.   . Cholecalciferol 25 MCG (1000 UT) tablet Take 1,000 Units by mouth.    Marland Kitchen CINNAMON PO Take by mouth.   . Coenzyme Q10 200 MG TABS Take 200 mg by mouth daily.    Marland Kitchen desoximetasone (TOPICORT) 0.25 % cream Apply 1 application topically 2 (two) times daily as needed.   . diclofenac sodium (VOLTAREN) 1 % GEL Apply topically.   . dicyclomine  (BENTYL) 20 MG tablet TAKE 1 TABLET (20 MG TOTAL) BY MOUTH EVERY 6 (SIX) HOURS 06/12/2020: As needed  . DULoxetine (CYMBALTA) 30 MG capsule Take 1 capsule (30 mg total) by mouth daily. Take with existing $RemoveBefor'60mg'DzotqSWRTuRZ$  dose = total daily dose $RemoveBe'90mg'JQjWEDsws$    . DULoxetine (CYMBALTA) 60 MG capsule Take 1 capsule (60 mg total) by mouth daily. Take with existing $RemoveBefor'30mg'mxCTJsHbMnFz$  dose = total daily dose $RemoveBe'90mg'rHDvEifxS$    . fluticasone (FLONASE) 50 MCG/ACT nasal spray Place 2 sprays into both nostrils daily.   Marland Kitchen gabapentin (NEURONTIN) 300 MG capsule Take 3 capsules by mouth 3 (three) times daily.   . hydrochlorothiazide (HYDRODIURIL) 25 MG tablet Take 1 tablet (25 mg total) by mouth daily.   Marland Kitchen lisinopril (ZESTRIL) 5 MG tablet Take 1 tablet (5 mg total) by mouth daily.   . meloxicam (MOBIC) 7.5 MG tablet Take 7.5 mg by mouth 2 (two) times daily.    . metFORMIN (GLUCOPHAGE) 1000 MG tablet Take 1,000 mg by mouth 2 (two) times daily with a meal.   . Multiple Vitamin (MULTIVITAMIN) tablet Take 1 tablet by mouth daily.   Marland Kitchen NEEDLE, DISP, 18 G (B-D DISP NEEDLE TW 18GX1") 18G X 1" MISC For use to draw up medication into the syringe   . NEEDLE, DISP, 21 G (BD SAFETYGLIDE SHIELDED NEEDLE) 21G X 1-1/2" MISC For use of administering medication in the IM location   . Omega-3 Fatty  Acids (FISH OIL) 1200 MG CAPS Take 1 capsule (1,200 mg total) by mouth daily.   . pantoprazole (PROTONIX) 40 MG tablet Take 40 mg by mouth 2 (two) times daily.   . propranolol (INNOPRAN XL) 80 MG 24 hr capsule Take 1 capsule (80 mg total) by mouth once daily   . Syringe, Disposable, 3 ML MISC Use syringes as directed by physician   . tamsulosin (FLOMAX) 0.4 MG CAPS capsule Take 1 capsule (0.4 mg total) by mouth daily.   Marland Kitchen testosterone cypionate (DEPOTESTOSTERONE CYPIONATE) 200 MG/ML injection Inject 1 mL (200 mg total) into the muscle every 14 (fourteen) days.   Marland Kitchen tiZANidine (ZANAFLEX) 4 MG tablet 1 tablet twice daily, May take an additional 1.5 tablet at bedtime   . traMADol  (ULTRAM) 50 MG tablet Take by mouth every 6 (six) hours as needed.   . TRUE METRIX BLOOD GLUCOSE TEST test strip Check blood sugar 2 times a day   . TRUEplus Lancets 33G MISC Check blood sugar 2 times a day    No facility-administered encounter medications on file as of 09/07/2020.     Goals Addressed    .  SW- "I'm interested in getting therapy.". (pt-stated)        Current Barriers:  . Chronic Mental Health needs related to Anxiety and Depression . Limited social support . ADL IADL limitations . Mental Health Concerns  . Social Isolation . Inability to perform ADL's independently . Inability to perform IADL's independently . Suicidal Ideation/Homicidal Ideation: No  Clinical Social Work Goal(s):  Marland Kitchen Over the next 120 days, patient/caregiver will work with SW to address concerns related to care coordination needs, lack of a stable support network and lack of Economist. LCSW will assist patient in gaining additional support in order to maintain health and mental health appropriately. Patient is now interested in gaining therapy for mental health.  . Over the next 120 days, patient will demonstrate improved adherence to self care as evidenced by implementing healthy self-care into his daily routine such as: attending all medical appointments, deep breathing exercises, taking time for self-reflection, taking medications as prescribed, drinking water and daily exercise to improve mobility and mood.  . Over the next 120 days, patient will work with SW bby telephone or in person to reduce or manage symptoms related to stress and anxiety . Over the next 120 days, patient will demonstrate improved health management independence as evidenced by implementing healthy self-care skills and positive support/resources into his daily routine to help cope with stressors and improve overall health and well-being  . Over the next 120 days, patient or caregiver will verbalize basic  understanding of depression/stress process and self health management plan as evidenced by her participation in development of long term plan of care and institution of self health management strategies .   Interventions: . Patient interviewed and appropriate assessments performed: brief mental health assessment . Provided mental health counseling with regard to anxiety, stress and depression management. Patient changed insurances and had to discharge from his last psychiatrist. Patient reports having a new psychiatrist at [facility] named Dr. Kandis Mannan but LCSW noted that he is a pain management physician and not a psychiatrist. Patient reports feeling comfortable with his current level of care and current providers. He reports that he will contact [facility] today to ask Dr. Kandis Mannan about a referral for a therapist there.  . Provided patient with information about healthy self-care. Patient went to neurologist yesterday for tremors and was instructed to  take his blood pressure medication at noon instead of the morning to see if this will help. LCSW will update PCP on this medication adjustment.  Marland Kitchen LCSW discussed coping skills for anxiety. SW used empathetic and active and reflective listening, validated patient's feelings/concerns, and provided emotional support. LCSW provided examples of healthy self-care to help manage his multiple health conditions and improve his mood.  . Discussed plans with patient for ongoing care management follow up and provided patient with direct contact information for care management team . Advised patient to contact CCM LCSW if he runs into any issues with securing therapy.  . Assisted patient/caregiver with obtaining information about health plan benefits . Provided education and assistance to client regarding Advanced Directives. . Encouraged patient to gain a therapist for long term follow up and therapy/counseling. Patient has complicated grief as he lost his Dad on Oct 31st of 2019  and his mother 20 years ago. Local grief support resource and Grief Counseling provided during session  Patient Self Care Activities:  . Calls provider office for new concerns or questions . Ability for insight . Independent living . Motivation for treatment  Patient Coping Strengths:  . Supportive Relationships . Hopefulness . Self Advocate . Able to Communicate Effectively  Patient Self Care Deficits:  . Lacks social connections  Initial goal documentation    10 LITTLE Things To Do When You're Feeling Too Down To Do Anything  Take a shower. Even if you plan to stay in all day long and not see a soul, take a shower. It takes the most effort to hop in to the shower but once you do, you'll feel immediate results. It will wake you up and you'll be feeling much fresher (and cleaner too).  Brush and floss your teeth. Give your teeth a good brushing with a floss finish. It's a small task but it feels so good and you can check 'taking care of your health' off the list of things to do.  Do something small on your list. Most of [place] have some small thing we would like to get done (load of laundry, sew a button, email a friend). Doing one of these things will make you feel like you've accomplished something.  Drink water. Drinking water is easy right? It's also really beneficial for your health so keep a glass beside you all day and take sips often. It gives you energy and prevents you from boredom eating.  Do some floor exercises. The last thing you want to do is exercise but it might be just the thing you need the most. Keep it simple and do exercises that involve sitting or laying on the floor. Even the smallest of exercises release chemicals in the brain that make you feel good. Yoga stretches or core exercises are going to make you feel good with minimal effort.  Make your bed. Making your bed takes a few minutes but it's productive and you'll feel relieved when it's done. An unmade  bed is a huge visual reminder that you're having an unproductive day. Do it and consider it your housework for the day.  Put on some nice clothes. Take the sweatpants off even if you don't plan to [name] anywhere. Put on clothes that make you feel good. Take a look in the mirror so your brain recognizes the sweatpants have been replaced with clothes that make you look great. It's an instant confidence booster.  Wash the dishes. A pile of dirty dishes in the sink is  a reflection of your mood. It's possible that if you wash up the dishes, your mood will follow suit. It's worth a try.  Cook a real meal. If you have the luxury to have a "do nothing" day, you have time to make a real meal for yourself. Make a meal that you love to eat. The process is good to get you out of the funk and the food will ensure you have more energy for tomorrow.  Write out your thoughts by hand. When you hand write, you stimulate your brain to focus on the moment that you're in so make yourself comfortable and write whatever comes into your mind. Put those thoughts out on paper so they stop spinning around in your head. Those thoughts might be the very thing holding you down.  Follow Up Plan: SW will follow up with patient by phone over the next quarter  Eula Fried, [place], MSW, [email or web address] Phone: [phone]

## 2020-09-13 ENCOUNTER — Telehealth: Payer: Self-pay

## 2020-09-13 ENCOUNTER — Telehealth: Payer: Self-pay | Admitting: Pharmacist

## 2020-09-13 NOTE — Chronic Care Management (AMB) (Signed)
  Chronic Care Management   Outreach Note  09/13/2020 Name: Dale [name]. MRN: [id number] DOB: 1961/08/01  Referred by: Olin Hauser, DO Reason for referral : No chief complaint on file.  Was unable to reach patient via telephone today and have left HIPAA compliant voicemail asking patient to return my call.    Follow Up Plan: Will collaborate with Care Guide to outreach to schedule follow up with me  Harlow Asa, PharmD, [place] [facility] [phone]

## 2020-09-15 NOTE — Telephone Encounter (Signed)
Pt has been r/s  

## 2020-09-22 ENCOUNTER — Other Ambulatory Visit: Payer: Self-pay | Admitting: Urology

## 2020-10-04 DIAGNOSIS — M961 Postlaminectomy syndrome, not elsewhere classified: Secondary | ICD-10-CM | POA: Diagnosis not present

## 2020-10-04 DIAGNOSIS — G6289 Other specified polyneuropathies: Secondary | ICD-10-CM | POA: Diagnosis not present

## 2020-10-04 DIAGNOSIS — F1190 Opioid use, unspecified, uncomplicated: Secondary | ICD-10-CM | POA: Diagnosis not present

## 2020-10-11 ENCOUNTER — Other Ambulatory Visit: Payer: Self-pay | Admitting: Family Medicine

## 2020-10-11 ENCOUNTER — Ambulatory Visit: Payer: Medicare HMO | Admitting: Pharmacist

## 2020-10-11 ENCOUNTER — Telehealth: Payer: Self-pay

## 2020-10-11 DIAGNOSIS — F5101 Primary insomnia: Secondary | ICD-10-CM

## 2020-10-11 DIAGNOSIS — E1140 Type 2 diabetes mellitus with diabetic neuropathy, unspecified: Secondary | ICD-10-CM

## 2020-10-11 DIAGNOSIS — F331 Major depressive disorder, recurrent, moderate: Secondary | ICD-10-CM

## 2020-10-11 DIAGNOSIS — I10 Essential (primary) hypertension: Secondary | ICD-10-CM

## 2020-10-11 NOTE — Patient Instructions (Signed)
Thank you allowing the Chronic Care Management Team to be a part of your care! It was a pleasure speaking with you today!     CCM (Chronic Care Management) Team    Noreene Larsson RN, MSN, CCM Nurse Care Coordinator  [phone]   Harlow Asa PharmD  Clinical Pharmacist  [phone]   Eula Fried LCSW Clinical Social Worker [phone]  Visit Information  Goals Addressed            This Visit's Progress   . PharmD - Medication Assistance       CARE PLAN ENTRY (see longitudinal plan of care for additional care plan information)  Current Barriers:  . Financial Barriers in complicated patient with multiple medical conditions including T2DM, HTN, peripheral neuropathy, depression, GERD, tremors and chronic pain; patient has [employer] and reports copay for Ozempic is cost prohibitive at this time as he is currently in the coverage gap o Controlled T2DM, latest A1C: o Lab Results o  Component o Value o Date o   o HGBA1C o 5.5 o 07/25/2020    Pharmacist Clinical Goal(s):  Marland Kitchen Over the next 30 days, patient will work with PharmD and providers to relieve medication access concerns  Interventions:  Today patient reports that he fell off of a ladder yesterday onto paved driveway and hit his head on the pavement.   States he does not belive he lost conciousness  Denies dizziness, changes in vision or balance, nausea/vomiting, but said that he is not sure that he can walk due to pain in his backside. I advised him to call the clinic to report the fall and for further evaluation/instruction  Send secure message to patient's PCP  Will reschedule time with patient to follow up regarding medication management, T2DM and HTN  Inter-disciplinary care team collaboration (see longitudinal plan of care)  Patient Self Care Activities:  . Patient to attend medical appointments as scheduled  Please see past updates related to this goal by clicking on the "Past  Updates" button in the selected goal         The patient verbalized understanding of instructions, educational materials, and care plan provided today and declined offer to receive copy of patient instructions, educational materials, and care plan.   The care management team will reach out to the patient again over the next 7 days.   Harlow Asa, PharmD, [place] [employer] [phone]

## 2020-10-11 NOTE — Telephone Encounter (Signed)
Limited options since we are booked the rest of this week.  Patient has complex back pain normally with prior back surgery.  He has [facility] pain management doctors, just saw them on 10/04/20.  I would recommend he contact them first to see if they can see him for his new back pain injury  Otherwise he may go sooner to Urgent Care for prompt evaluation with fall injury  Last option is if he wants evaluation and x-ray, possibly we can order x-ray sooner and then have him follow-up next week if not improved.  I cannot rx new medication for him now without evaluating him.  [name], [facility] 10/11/2020, 4:46 PM

## 2020-10-11 NOTE — Telephone Encounter (Signed)
Copied from [place] [id number]. Topic: General - Other >> Oct 11, 2020 11:52 AM Antonieta Iba C wrote: Reason for CRM: pt called in to be advised. Pt says that he fell yesterday and hit his head. Pt says that he spoke with Benjamine Mola and updated her with the details of his fall. Pt says that he felt completed fine until this morning, pt says that he is really sore today which is causing it to be hard to move. Pt would like to know if provider think that he should be seen?

## 2020-10-11 NOTE — Telephone Encounter (Signed)
Spoke with patient he fell on his back from four feet ladder by missing one of the steps did not had any pain yesterday but sore today told him that he needs appointment but Dr Raliegh Ip does not have any opening either Thursday or Friday, patient would like to be seen by Dr Raliegh Ip and he denies any sign of Concussion but soreness mostly on Right side of his back and HIP area. Please suggest ?

## 2020-10-11 NOTE — Telephone Encounter (Signed)
Left detail message. 

## 2020-10-11 NOTE — Chronic Care Management (AMB) (Signed)
Chronic Care Management   Follow Up Note   10/11/2020 Name: Dale [name]. MRN: [id number] DOB: May 31, 1961  Referred by: Olin Hauser, DO Reason for referral : Chronic Care Management (Patient Phone Call)   Dale [name]. is a 59 y.o. year old male who is a primary care patient of Olin Hauser, DO. The CCM team was consulted for assistance with chronic disease management and care coordination needs.     I reached out to [name] by phone today.   Review of patient status, including review of consultants reports, relevant laboratory and other test results, and collaboration with appropriate care team members and the patient's provider was performed as part of comprehensive patient evaluation and provision of chronic care management services.    SDOH (Social Determinants of Health) assessments performed: No See Care Plan activities for detailed interventions related to [facility])     Outpatient Encounter Medications as of 10/11/2020  Medication Sig Note  . acetaminophen (TYLENOL) 500 MG tablet Take by mouth.   . Alcohol Swabs (B-D SINGLE USE SWABS REGULAR) PADS Check blood sugar 2 times daily.   Marland Kitchen ALPRAZolam (XANAX) 1 MG tablet Take 1 mg by mouth 3 (three) times daily.   Marland Kitchen aspirin EC 81 MG tablet Take 81 mg by mouth daily.   Marland Kitchen atorvastatin (LIPITOR) 10 MG tablet TAKE 1 TABLET BY MOUTH DAILY AT 2 PM   . b complex vitamins capsule Take by mouth.   . Blood Glucose Calibration (TRUE METRIX LEVEL 1) Low SOLN Check blood sugar 2 times daily   . Blood Glucose Monitoring Suppl (TRUE METRIX AIR GLUCOSE METER) w/Device KIT Check blood sugar 2 times daily   . buPROPion (WELLBUTRIN XL) 300 MG 24 hr tablet TAKE 1 TABLET (300 MG TOTAL) BY MOUTH DAILY.   . butalbital-acetaminophen-caffeine (FIORICET) 50-325-40 MG tablet Take 1 tablet by mouth daily as needed for headache.   . Cholecalciferol 25 MCG (1000 UT) tablet Take 1,000 Units by mouth.    Marland Kitchen CINNAMON PO  Take by mouth.   . Coenzyme Q10 200 MG TABS Take 200 mg by mouth daily.    Marland Kitchen desoximetasone (TOPICORT) 0.25 % cream Apply 1 application topically 2 (two) times daily as needed.   . diclofenac sodium (VOLTAREN) 1 % GEL Apply topically.   . dicyclomine (BENTYL) 20 MG tablet TAKE 1 TABLET (20 MG TOTAL) BY MOUTH EVERY 6 (SIX) HOURS 06/12/2020: As needed  . DULoxetine (CYMBALTA) 30 MG capsule Take 1 capsule (30 mg total) by mouth daily. Take with existing $RemoveBefor'60mg'lQYTXYrRcwCG$  dose = total daily dose $RemoveBe'90mg'TkXgICYVz$    . DULoxetine (CYMBALTA) 60 MG capsule Take 1 capsule (60 mg total) by mouth daily. Take with existing $RemoveBefor'30mg'vTJqYVaTrUMz$  dose = total daily dose $RemoveBe'90mg'YNNcjFeAB$    . fluticasone (FLONASE) 50 MCG/ACT nasal spray Place 2 sprays into both nostrils daily.   Marland Kitchen gabapentin (NEURONTIN) 300 MG capsule Take 3 capsules by mouth 3 (three) times daily.   . hydrochlorothiazide (HYDRODIURIL) 25 MG tablet Take 1 tablet (25 mg total) by mouth daily.   Marland Kitchen lisinopril (ZESTRIL) 5 MG tablet Take 1 tablet (5 mg total) by mouth daily.   . meloxicam (MOBIC) 7.5 MG tablet Take 7.5 mg by mouth 2 (two) times daily.    . metFORMIN (GLUCOPHAGE) 1000 MG tablet Take 1,000 mg by mouth 2 (two) times daily with a meal.   . Multiple Vitamin (MULTIVITAMIN) tablet Take 1 tablet by mouth daily.   Marland Kitchen NEEDLE, DISP, 18 G (B-D  DISP NEEDLE TW 18GX1") 18G X 1" MISC For use to draw up medication into the syringe   . NEEDLE, DISP, 21 G (BD SAFETYGLIDE SHIELDED NEEDLE) 21G X 1-1/2" MISC For use of administering medication in the IM location   . Omega-3 Fatty Acids (FISH OIL) 1200 MG CAPS Take 1 capsule (1,200 mg total) by mouth daily.   . pantoprazole (PROTONIX) 40 MG tablet Take 40 mg by mouth 2 (two) times daily.   . propranolol (INNOPRAN XL) 80 MG 24 hr capsule Take 1 capsule (80 mg total) by mouth once daily   . Syringe, Disposable, 3 ML MISC Use syringes as directed by physician   . tamsulosin (FLOMAX) 0.4 MG CAPS capsule TAKE 1 CAPSULE BY MOUTH EVERY DAY   . testosterone cypionate  (DEPOTESTOSTERONE CYPIONATE) 200 MG/ML injection Inject 1 mL (200 mg total) into the muscle every 14 (fourteen) days.   Marland Kitchen tiZANidine (ZANAFLEX) 4 MG tablet 1 tablet twice daily, May take an additional 1.5 tablet at bedtime   . traMADol (ULTRAM) 50 MG tablet Take by mouth every 6 (six) hours as needed.   . TRUE METRIX BLOOD GLUCOSE TEST test strip Check blood sugar 2 times a day   . TRUEplus Lancets 33G MISC Check blood sugar 2 times a day    No facility-administered encounter medications on file as of 10/11/2020.    Goals Addressed            This Visit's Progress   . PharmD - Medication Assistance       CARE PLAN ENTRY (see longitudinal plan of care for additional care plan information)  Current Barriers:  . Financial Barriers in complicated patient with multiple medical conditions including T2DM, HTN, peripheral neuropathy, depression, GERD, tremors and chronic pain; patient has [employer] and reports copay for Ozempic is cost prohibitive at this time as he is currently in the coverage gap o Controlled T2DM, latest A1C: o Lab Results o  Component o Value o Date o   o HGBA1C o 5.5 o 07/25/2020    Pharmacist Clinical Goal(s):  Marland Kitchen Over the next 30 days, patient will work with PharmD and providers to relieve medication access concerns  Interventions:  Today patient reports that he fell off of a ladder yesterday onto paved driveway and hit his head on the pavement.   States he does not belive he lost conciousness  Denies dizziness, changes in vision or balance, nausea/vomiting, but said that he is not sure that he can walk due to pain in his backside. I advised him to call the clinic to report the fall and for further evaluation/instruction  Send secure message to patient's PCP  Will reschedule time with patient to follow up regarding medication management, T2DM and HTN  Inter-disciplinary care team collaboration (see longitudinal plan of care)  Patient Self Care  Activities:  . Patient to attend medical appointments as scheduled  Please see past updates related to this goal by clicking on the "Past Updates" button in the selected goal         Plan  The care management team will reach out to the patient again over the next 7 days.   Harlow Asa, PharmD, [place] [employer] [phone]

## 2020-10-17 DIAGNOSIS — F341 Dysthymic disorder: Secondary | ICD-10-CM | POA: Diagnosis not present

## 2020-10-17 DIAGNOSIS — F419 Anxiety disorder, unspecified: Secondary | ICD-10-CM | POA: Diagnosis not present

## 2020-10-26 ENCOUNTER — Ambulatory Visit: Payer: Medicare HMO | Admitting: Licensed Clinical Social Worker

## 2020-10-26 DIAGNOSIS — F331 Major depressive disorder, recurrent, moderate: Secondary | ICD-10-CM

## 2020-10-26 DIAGNOSIS — E1140 Type 2 diabetes mellitus with diabetic neuropathy, unspecified: Secondary | ICD-10-CM

## 2020-10-26 DIAGNOSIS — I10 Essential (primary) hypertension: Secondary | ICD-10-CM

## 2020-10-26 DIAGNOSIS — E785 Hyperlipidemia, unspecified: Secondary | ICD-10-CM

## 2020-10-26 NOTE — Chronic Care Management (AMB) (Signed)
Chronic Care Management    Clinical Social Work Follow Up Note  10/26/2020 Name: Dale [name]. MRN: [id number] DOB: September 08, 1961  Dale [name]. is a 59 y.o. year old male who is a primary care patient of Dale Hauser, DO. The CCM team was consulted for assistance with Mental Health Counseling and Resources and Grief Counseling.   Review of patient status, including review of consultants reports, other relevant assessments, and collaboration with appropriate care team members and the patient's provider was performed as part of comprehensive patient evaluation and provision of chronic care management services.    SDOH (Social Determinants of Health) assessments performed: Yes    Outpatient Encounter Medications as of 10/26/2020  Medication Sig Note  . acetaminophen (TYLENOL) 500 MG tablet Take by mouth.   . Alcohol Swabs (B-D SINGLE USE SWABS REGULAR) PADS Check blood sugar 2 times daily.   Marland Kitchen ALPRAZolam (XANAX) 1 MG tablet Take 1 mg by mouth 3 (three) times daily.   Marland Kitchen aspirin EC 81 MG tablet Take 81 mg by mouth daily.   Marland Kitchen atorvastatin (LIPITOR) 10 MG tablet TAKE 1 TABLET BY MOUTH DAILY AT 2 PM   . b complex vitamins capsule Take by mouth.   . Blood Glucose Calibration (TRUE METRIX LEVEL 1) Low SOLN Check blood sugar 2 times daily   . Blood Glucose Monitoring Suppl (TRUE METRIX AIR GLUCOSE METER) w/Device KIT Check blood sugar 2 times daily   . buPROPion (WELLBUTRIN XL) 300 MG 24 hr tablet TAKE 1 TABLET (300 MG TOTAL) BY MOUTH DAILY.   . butalbital-acetaminophen-caffeine (FIORICET) 50-325-40 MG tablet Take 1 tablet by mouth daily as needed for headache.   . Cholecalciferol 25 MCG (1000 UT) tablet Take 1,000 Units by mouth.    Marland Kitchen CINNAMON PO Take by mouth.   . Coenzyme Q10 200 MG TABS Take 200 mg by mouth daily.    Marland Kitchen desoximetasone (TOPICORT) 0.25 % cream Apply 1 application topically 2 (two) times daily as needed.   . diclofenac sodium (VOLTAREN) 1 % GEL Apply  topically.   . dicyclomine (BENTYL) 20 MG tablet TAKE 1 TABLET (20 MG TOTAL) BY MOUTH EVERY 6 (SIX) HOURS 06/12/2020: As needed  . DULoxetine (CYMBALTA) 30 MG capsule Take 1 capsule (30 mg total) by mouth daily. Take with existing $RemoveBefor'60mg'FWZgrsGWocKX$  dose = total daily dose $RemoveBe'90mg'pOEACNjYh$    . DULoxetine (CYMBALTA) 60 MG capsule Take 1 capsule (60 mg total) by mouth daily. Take with existing $RemoveBefor'30mg'GDWfkbEGqtyG$  dose = total daily dose $RemoveBe'90mg'qVhQBmSNG$    . fluticasone (FLONASE) 50 MCG/ACT nasal spray Place 2 sprays into both nostrils daily.   Marland Kitchen gabapentin (NEURONTIN) 300 MG capsule Take 3 capsules by mouth 3 (three) times daily.   . hydrochlorothiazide (HYDRODIURIL) 25 MG tablet Take 1 tablet (25 mg total) by mouth daily.   Marland Kitchen lisinopril (ZESTRIL) 5 MG tablet Take 1 tablet (5 mg total) by mouth daily.   . meloxicam (MOBIC) 7.5 MG tablet Take 7.5 mg by mouth 2 (two) times daily.    . metFORMIN (GLUCOPHAGE) 1000 MG tablet Take 1,000 mg by mouth 2 (two) times daily with a meal.   . Multiple Vitamin (MULTIVITAMIN) tablet Take 1 tablet by mouth daily.   Marland Kitchen NEEDLE, DISP, 18 G (B-D DISP NEEDLE TW 18GX1") 18G X 1" MISC For use to draw up medication into the syringe   . NEEDLE, DISP, 21 G (BD SAFETYGLIDE SHIELDED NEEDLE) 21G X 1-1/2" MISC For use of administering medication in the IM location   .  Omega-3 Fatty Acids (FISH OIL) 1200 MG CAPS Take 1 capsule (1,200 mg total) by mouth daily.   . pantoprazole (PROTONIX) 40 MG tablet Take 40 mg by mouth 2 (two) times daily.   . propranolol (INNOPRAN XL) 80 MG 24 hr capsule Take 1 capsule (80 mg total) by mouth once daily   . Syringe, Disposable, 3 ML MISC Use syringes as directed by physician   . tamsulosin (FLOMAX) 0.4 MG CAPS capsule TAKE 1 CAPSULE BY MOUTH EVERY DAY   . testosterone cypionate (DEPOTESTOSTERONE CYPIONATE) 200 MG/ML injection Inject 1 mL (200 mg total) into the muscle every 14 (fourteen) days.   Marland Kitchen tiZANidine (ZANAFLEX) 4 MG tablet 1 tablet twice daily, May take an additional 1.5 tablet at bedtime   .  traMADol (ULTRAM) 50 MG tablet Take by mouth every 6 (six) hours as needed.   . TRUE METRIX BLOOD GLUCOSE TEST test strip Check blood sugar 2 times a day   . TRUEplus Lancets 33G MISC Check blood sugar 2 times a day    No facility-administered encounter medications on file as of 10/26/2020.     Goals Addressed    .  SW-Begin and Stick with Counseling-Depression (pt-stated)        Timeframe:  Long-Range Goal Priority:  Medium Start Date: 10/26/20                            Expected End Date: 01/24/21                     Follow Up Date - 90 days from 10/26/20   -implement mental health support as needed - keep 90 percent of counseling appointments - attend counseling appointment in February of 2022   Why is this important?    Beating depression may take some time.   If you don't feel better right away, don't give up on your treatment plan.   Current Barriers:  . Chronic Mental Health needs related to Anxiety and Depression . Limited social support . ADL IADL limitations . Mental Health Concerns  . Social Isolation . Inability to perform ADL's independently . Inability to perform IADL's independently . Suicidal Ideation/Homicidal Ideation: No  Clinical Social Work Goal(s):  Marland Kitchen Over the next 120 days, patient/caregiver will work with SW to address concerns related to care coordination needs, lack of a stable support network and lack of Brewing technologist. LCSW will assist patient in gaining additional support in order to maintain health and mental health appropriately. Patient is now interested in gaining therapy for mental health.  . Over the next 120 days, patient will demonstrate improved adherence to self care as evidenced by implementing healthy self-care into his daily routine such as: attending all medical appointments, deep breathing exercises, taking time for self-reflection, taking medications as prescribed, drinking water and daily exercise to improve  mobility and mood.  . Over the next 120 days, patient will work with SW bby telephone or in person to reduce or manage symptoms related to stress and anxiety . Over the next 120 days, patient will demonstrate improved health management independence as evidenced by implementing healthy self-care skills and positive support/resources into his daily routine to help cope with stressors and improve overall health and well-being  . Over the next 120 days, patient or caregiver will verbalize basic understanding of depression/stress process and self health management plan as evidenced by her participation in development of long term plan of  care and institution of self health management strategies .   Interventions: . Patient interviewed and appropriate assessments performed: brief mental health assessment . Provided mental health counseling with regard to anxiety, stress and depression management. Patient changed insurances and had to discharge from his last psychiatrist. Patient reports having a new psychiatrist at [facility] named Dr. Archie Balboa but LCSW noted that he is a pain management physician and not a psychiatrist. Patient reports feeling comfortable with his current level of care and current providers. He reports that he will contact [facility] today to ask Dr. Archie Balboa about a referral for a therapist there. UPDATE-Patient has seen Dr. Archie Balboa 3 times now. Dr. Archie Balboa prescribed Abilify medication for patient but he was unable to afford this. CCM LCSW will make referral for CCM Pharmacist regarding this concern. Dr. Archie Balboa completed referral for psychologist/counseling appointment in February of 2022.  Marland Kitchen Provided patient with information about healthy self-care. Patient went to neurologist yesterday for tremors and was instructed to take his blood pressure medication at noon instead of the morning to see if this will help. PCP was updated on this medication adjustment. Patient shares that he had an anxiety attack  yesterday and his blood pressure went up to 148/99. Marland Kitchen LCSW discussed coping skills for anxiety. SW used empathetic and active and reflective listening, validated patient's feelings/concerns, and provided emotional support. LCSW provided examples of healthy self-care to help manage his multiple health conditions and improve his mood.  . Discussed plans with patient for ongoing care management follow up and provided patient with direct contact information for care management team . Advised patient to contact CCM LCSW if he runs into any issues with securing therapy.  . Patient reports stress around having so many medications to take. He shares that his cabinet has 21 medications in it and he dreads getting up in the morning and taking these. CCM Pharmacy referral placed for medication management and financial assistance for Abilify on 10/26/20 . Assisted patient/caregiver with obtaining information about health plan benefits . Provided education and assistance to client regarding Advanced Directives. . Encouraged patient to gain a therapist for long term follow up and therapy/counseling. Patient has complicated grief as he lost his Dad on Oct 31st of 2019 and his mother 20 years ago. Grief support resource and Grief Counseling provided during session  Patient Self Care Activities:  . Calls provider office for new concerns or questions . Ability for insight . Independent living . Motivation for treatment  Patient Coping Strengths:  . Supportive Relationships . Hopefulness . Self Advocate . Able to Communicate Effectively  Patient Self Care Deficits:  . Lacks social connections      Follow Up Plan: SW will follow up with patient by phone over the next quarter  Eula Fried, [place], MSW, [email or web address] Phone: [phone]

## 2020-10-30 ENCOUNTER — Telehealth: Payer: Self-pay | Admitting: Pharmacist

## 2020-10-30 NOTE — Telephone Encounter (Signed)
  Chronic Care Management   Outreach Note  10/30/2020 Name: Dale [name]. MRN: [id number] DOB: 1961/10/03  Referred by: Smitty Cords, DO Reason for referral : No chief complaint on file.   Was unable to reach patient via telephone today and have left HIPAA compliant voicemail asking patient to return my call.    Follow Up Plan: Will collaborate with Care Guide to outreach to schedule follow up with me  Duanne Moron, PharmD, [facility] Clinical Pharmacist [facility] Care Management [phone]

## 2020-11-01 ENCOUNTER — Other Ambulatory Visit: Payer: Self-pay

## 2020-11-01 ENCOUNTER — Encounter: Payer: Self-pay | Admitting: Family Medicine

## 2020-11-01 ENCOUNTER — Ambulatory Visit (INDEPENDENT_AMBULATORY_CARE_PROVIDER_SITE_OTHER): Payer: Medicare HMO | Admitting: Family Medicine

## 2020-11-01 VITALS — BP 132/75 | HR 68 | Temp 97.5°F | Resp 16 | Ht 70.0 in | Wt 221.0 lb

## 2020-11-01 DIAGNOSIS — E1140 Type 2 diabetes mellitus with diabetic neuropathy, unspecified: Secondary | ICD-10-CM | POA: Diagnosis not present

## 2020-11-01 DIAGNOSIS — J0100 Acute maxillary sinusitis, unspecified: Secondary | ICD-10-CM | POA: Diagnosis not present

## 2020-11-01 DIAGNOSIS — E669 Obesity, unspecified: Secondary | ICD-10-CM | POA: Diagnosis not present

## 2020-11-01 DIAGNOSIS — I10 Essential (primary) hypertension: Secondary | ICD-10-CM | POA: Diagnosis not present

## 2020-11-01 DIAGNOSIS — E785 Hyperlipidemia, unspecified: Secondary | ICD-10-CM

## 2020-11-01 DIAGNOSIS — E1169 Type 2 diabetes mellitus with other specified complication: Secondary | ICD-10-CM

## 2020-11-01 MED ORDER — AZITHROMYCIN 250 MG PO TABS: ORAL_TABLET | ORAL | 0 refills | Status: DC

## 2020-11-01 MED ORDER — IPRATROPIUM BROMIDE 0.06 % NA SOLN: 2.0000 | Freq: Four times a day (QID) | NASAL | 0 refills | Status: DC

## 2020-11-01 NOTE — Patient Instructions (Addendum)
Thank you for coming to the office today.  Will order Ozempic 1mg  and all other meds 90 day to [facility] on Monday 11/06/20  Consider Trigeminal Neuralgia for the forehead/scalp nerve pain - recommend discussion with Dr Manuella Ghazi, already on Tramadol to help nerve pain.  No longer on Amlodipine medicine.  Now we will try to HOLD or STOP Hydrochlorothiazide 25mg . Try this for few days, message on mychart on Monday or before Monday if/when ready for all refills and let me know if you are successfully off this BP med, goal is BP < 135/85 or < 140/90, and less of those symptoms.   DUE for FASTING BLOOD WORK (no food or drink after midnight before the lab appointment, only water or coffee without cream/sugar on the morning of)  SCHEDULE "Lab Only" visit in the morning at the clinic for lab draw in 1 WEEK  - Make sure Lab Only appointment is at about 1 week before your next appointment, so that results will be available  For Lab Results, once available within 2-3 days of blood draw, you can can log in to MyChart online to view your results and a brief explanation. Also, we can discuss results at next follow-up visit.   Please schedule a Follow-up Appointment to: Return in about 1 week (around 11/08/2020) for 1 week fasting bloodwork, Follow-up 1 week fasting lab only then 3-6 month follow-up Diabetes.  If you have any other questions or concerns, please feel free to call the office or send a message through [facility]. You may also schedule an earlier appointment if necessary.  Additionally, you may be receiving a survey about your experience at our office within a few days to 1 week by e-mail or mail. We value your feedback.  [name], DO [facility], [facility]   Trigeminal Neuralgia  Trigeminal neuralgia is a nerve disorder that causes severe pain on one side of the face. The pain may last from a few seconds to several minutes. The pain is usually only on one side of the  face. Symptoms may occur for days, weeks, or months and then go away for months or years. The pain may return and be worse than before. What are the causes? This condition is caused by damage or pressure to a nerve in the head that is called the trigeminal nerve. An attack can be triggered by:  Talking.  Chewing.  Putting on makeup.  Washing your face.  Shaving your face.  Brushing your teeth.  Touching your face. What increases the risk? You are more likely to develop this condition if you:  Are 31 years of age or older.  Are male. What are the signs or symptoms? The main symptom of this condition is severe pain in the:  Jaw.  Lips.  Eyes.  Nose.  Scalp.  Forehead.  Face. The pain may be:  Intense.  Stabbing.  Electric.  Shock-like. How is this diagnosed? This condition is diagnosed with a physical exam. A CT scan or an MRI may be done to rule out other conditions that can cause facial pain. How is this treated? This condition may be treated with:  Avoiding the things that trigger your symptoms.  Taking prescription medicines (anticonvulsants).  Having surgery. This may be done in severe cases if other medical treatment does not provide relief.  Having procedures such as ablation, thermal, or radiation therapy. It may take up to one month for treatment to start relieving the pain. Follow these instructions at  home: Managing pain  Learn as much as you can about how to manage your pain. Ask your health care provider if a pain specialist would be helpful.  Consider talking with a mental health care provider (psychologist) about how to cope with the pain.  Consider joining a pain support group. General instructions  Take over-the-counter and prescription medicines only as told by your health care provider.  Avoid the things that trigger your symptoms. It may help to: ? Chew on the unaffected side of your mouth. ? Avoid touching your  face. ? Avoid blasts of hot or cold air.  Follow your treatment plan as told by your health care provider. This may include: ? Cognitive or behavioral therapy. ? Gentle, regular exercise. ? Meditation or yoga. ? Aromatherapy.  Keep all follow-up visits as told by your health care provider. You may need to be monitored closely to make sure treatment is working well for you. Where to find more information  Facial Pain Association: fpa-support.org Contact a health care provider if:  Your medicine is not helping your symptoms.  You have side effects from the medicine used for treatment.  You develop new, unexplained symptoms, such as: ? Double vision. ? Facial weakness. ? Facial numbness. ? Changes in hearing or balance.  You feel depressed. Get help right away if:  Your pain is severe and is not getting better.  You develop suicidal thoughts. If you ever feel like you may hurt yourself or others, or have thoughts about taking your own life, get help right away. You can go to your nearest emergency department or call:  Your local emergency services (911 in the [place]  A suicide crisis helpline, such as the National Suicide Prevention Lifeline at (910)209-3236. This is open 24 hours a day. Summary  Trigeminal neuralgia is a nerve disorder that causes severe pain on one side of the face. The pain may last from a few seconds to several minutes.  This condition is caused by damage or pressure to a nerve in the head that is called the trigeminal nerve.  Treatment may include avoiding the things that trigger your symptoms, taking medicines, or having surgery or procedures. It may take up to one month for treatment to start relieving the pain.  Avoid the things that trigger your symptoms.  Keep all follow-up visits as told by your health care provider. You may need to be monitored closely to make sure treatment is working well for you. This information is not intended to replace  advice given to you by your health care provider. Make sure you discuss any questions you have with your health care provider. Document Revised: 09/07/2018 Document Reviewed: 09/07/2018 Elsevier Patient Education  2020 [employer].

## 2020-11-01 NOTE — Progress Notes (Signed)
Subjective:    Patient ID: Dale Hailey., male    DOB: [birth year], 60 y.o.   MRN: [id number]  Dale Monarch. is a 59 y.o. male presenting on 11/01/2020 for Diabetes   HPI    CHRONIC DM, Type 2/ Peripheral Neuropathy  He seems to do better on Ozempic 1.0mg  weekly, however waiting on financial assistance from manufacturer and working with [facility] pharmacy [place]. They were having difficulty with current insurance, now has upcoming change to [facility] on 11/04/20, he will need new orders to [facility] mail order on 1/3 Previous A1c around 5.5, he has had higher CBG now up to 110-140s He was reducing metformin on ozempic now back to full dose due to no longer on high dose ozempic Meds:Metformin 1000mg  BID, previously on Ozempic 1mg  weekly inj Reports good compliance. Tolerating well w/o side-effects Currently on ACEilow dose Lifestyle: - Diet: reduced portions improved - Exercise (limited due to pain and neuropathy) Chronic numbness tingling feet, with history of other neuropathy, followed by [facility]- limited benefit from Podiatry as well at previous visits. Podiatry gave him shoe insoles and neurology continued current meds with gabapentin [phone], Cymbalta 60mg , and offered Scrambler therapy / TENS unit for feetpreviously. Ultimately determined to be related to complication from prior back surgery - maintained neuropathy, neurology did nerve conduction and he is trying to determine if spinal/low back issues can be repaired DM Eye exam up to date Denies hypoglycemia, polyuria, visual changes  HTN resumed normal dosage HCTZ 25mg  since couldn't cut in half He has had some low BP readings 90-110s and lightheaded dizziness when standing at times, he asks about dosing adjust on this med worked with [facility] for [facility] pharmacy.   Trigeminal Neuralgia / Nerve Pain Reports 2-3 days Left side of head with sharp shooting pain, can be intermittent. Takes Tramadol with  some relief. Already on gabapentin, no other rash or other symptoms  He has seen specialist for Tremors. He often has tremors in morning. He takes Propranolol XL 80mg  daily. Limited relief  He follows with neurology still  Additionally  Sinusitis Reports sinus congestion pressure drainage, some foul smell. Previously has had similar sinus infection before. Has PCN allergy.   Depression screen [facility] 2/9 07/25/2020 01/19/2020 07/20/2019  Decreased Interest 2 1 0  Down, Depressed, Hopeless 1 1 0  PHQ - 2 Score 3 2 0  Altered sleeping 0 0 3  Tired, decreased energy 1 2 3   Change in appetite 0 0 0  Feeling bad or failure about yourself  0 0 0  Trouble concentrating 0 1 0  Moving slowly or fidgety/restless 0 0 0  Suicidal thoughts 0 0 0  PHQ-9 Score 4 5 6   Difficult doing work/chores Somewhat difficult Somewhat difficult Not difficult at all    Social History   Tobacco Use  . Smoking status: Former Smoker    Types: Cigarettes    Start date: 08/29/1979    Quit date: 11/04/2010    Years since quitting: 10.0  . Smokeless tobacco: Former 07/27/2020    Quit date: 11/19/2010  . Tobacco comment: 1 to 1.5 packs per day  Vaping Use  . Vaping Use: Never used  Substance Use Topics  . Alcohol use: No    Alcohol/week: 0.0 standard drinks  . Drug use: No    Review of Systems Per HPI unless specifically indicated above     Objective:    BP 132/75   Pulse 68   Temp (!)  97.5 F (36.4 C) (Temporal)   Resp 16   Ht 5\' 10"  (1.778 m)   Wt 221 lb (100.2 kg)   SpO2 100%   BMI 31.71 kg/m   Wt Readings from Last 3 Encounters:  11/01/20 221 lb (100.2 kg)  07/25/20 213 lb 9.6 oz (96.9 kg)  06/28/20 202 lb (91.6 kg)    Physical Exam Vitals and nursing note reviewed.  Constitutional:      General: He is not in acute distress.    Appearance: He is well-developed and well-nourished. He is not diaphoretic.     Comments: Well-appearing, comfortable, cooperative  HENT:     Head: Normocephalic and  atraumatic.     Mouth/Throat:     Mouth: Oropharynx is clear and moist.  Eyes:     General:        Right eye: No discharge.        Left eye: No discharge.     Conjunctiva/sclera: Conjunctivae normal.  Cardiovascular:     Rate and Rhythm: Normal rate.  Pulmonary:     Effort: Pulmonary effort is normal.  Musculoskeletal:        General: No edema.  Skin:    General: Skin is warm and dry.     Findings: No erythema or rash.  Neurological:     Mental Status: He is alert and oriented to person, place, and time.  Psychiatric:        Mood and Affect: Mood and affect normal.        Behavior: Behavior normal.     Comments: Well groomed, good eye contact, normal speech and thoughts       Results for orders placed or performed in visit on 07/25/20  POCT glycosylated hemoglobin (Hb A1C)  Result Value Ref Range   Hemoglobin A1C 5.5 4.0 - 5.6 %      Assessment & Plan:   Problem List Items Addressed This Visit    Type 2 diabetes, controlled, with neuropathy ([place])   Relevant Orders   Hemoglobin A1c   Obesity (BMI 30.0-34.9)   Relevant Orders   COMPLETE METABOLIC PANEL WITH GFR   Hyperlipidemia associated with type 2 diabetes mellitus ([place]) - Primary   Relevant Orders   COMPLETE METABOLIC PANEL WITH GFR   Lipid panel   Essential (primary) hypertension   Relevant Orders   COMPLETE METABOLIC PANEL WITH GFR    Other Visit Diagnoses    Acute non-recurrent maxillary sinusitis       Relevant Medications   ipratropium (ATROVENT) 0.06 % nasal spray   azithromycin (ZITHROMAX Z-PAK) 250 MG tablet       #Sinusitis Acute issue, will cover with Azithromycin and Atrovent nasal  #HTN Concern with hypotensive episodes / orthostatic Trial OFF HCTZ 25mg , HOLD/STOP med for now Continue Lisinopril 5mg  Continue Propranolol XL 80mg  daily Report to me on mychart over next few days and by Monday if needs re order HCTZ 12.5 or 25mg  dosage. Otherwise remain off med. We can consider dose increase  Lisinopril in future if needed.  #Hyperlipidemia Check lipid panel next week  #T2DM Check A1c next week Re order ozempic 1mg  weekly inj to [facility] mail order 90 day on Monday 11/06/20 once new ins active  Re order all other meds as well. 90 day to [facility]  #Trigeminal Neuralgia, suspected etiology Continue on Tramadol existing May use Gabapentin still as he is taking it - seems ineffective Can discuss with neurology at upcoming apt.  Meds ordered this encounter  Medications  . ipratropium (ATROVENT) 0.06 % nasal spray    Sig: Place 2 sprays into both nostrils 4 (four) times daily. For up to 5-7 days then stop.    Dispense:  15 mL    Refill:  0  . azithromycin (ZITHROMAX Z-PAK) 250 MG tablet    Sig: Take 2 tabs (500mg  total) on Day 1. Take 1 tab (250mg ) daily for next 4 days.    Dispense:  6 tablet    Refill:  0     Follow up plan: Return in about 1 week (around 11/08/2020) for 1 week fasting bloodwork, Follow-up 1 week fasting lab only then 3-6 month follow-up Diabetes.  Future labs ordered for next week 11/06/20 approx CMET, A1c, Lipid   01/06/2021, DO [facility] 11/01/2020, 11:32 AM

## 2020-11-01 NOTE — Telephone Encounter (Signed)
Patient has been rescheduled.

## 2020-11-02 ENCOUNTER — Telehealth: Payer: Self-pay

## 2020-11-06 ENCOUNTER — Telehealth: Payer: Self-pay | Admitting: Family Medicine

## 2020-11-06 ENCOUNTER — Other Ambulatory Visit: Payer: Self-pay

## 2020-11-06 ENCOUNTER — Other Ambulatory Visit: Payer: Self-pay | Admitting: Family Medicine

## 2020-11-06 DIAGNOSIS — E785 Hyperlipidemia, unspecified: Secondary | ICD-10-CM

## 2020-11-06 DIAGNOSIS — F331 Major depressive disorder, recurrent, moderate: Secondary | ICD-10-CM

## 2020-11-06 DIAGNOSIS — I10 Essential (primary) hypertension: Secondary | ICD-10-CM

## 2020-11-06 DIAGNOSIS — E1349 Other specified diabetes mellitus with other diabetic neurological complication: Secondary | ICD-10-CM

## 2020-11-06 DIAGNOSIS — E669 Obesity, unspecified: Secondary | ICD-10-CM

## 2020-11-06 DIAGNOSIS — E1140 Type 2 diabetes mellitus with diabetic neuropathy, unspecified: Secondary | ICD-10-CM

## 2020-11-06 MED ORDER — METFORMIN HCL 1000 MG PO TABS: 1000.0000 mg | ORAL_TABLET | Freq: Two times a day (BID) | ORAL | 3 refills | Status: DC

## 2020-11-06 MED ORDER — PROPRANOLOL HCL ER BEADS 80 MG PO CP24: 80.0000 mg | ORAL_CAPSULE | Freq: Every day | ORAL | 3 refills | Status: DC

## 2020-11-06 MED ORDER — METFORMIN HCL 1000 MG PO TABS: 1000.0000 mg | ORAL_TABLET | Freq: Two times a day (BID) | ORAL | 1 refills | Status: DC

## 2020-11-06 MED ORDER — BUPROPION HCL ER (XL) 300 MG PO TB24
300.0000 mg | ORAL_TABLET | Freq: Every day | ORAL | 3 refills | Status: DC
Start: 2020-11-06 — End: 2023-04-02

## 2020-11-06 MED ORDER — TRUE METRIX LEVEL 1 LOW VI SOLN
5 refills | Status: DC
Start: 2020-11-06 — End: 2021-11-09

## 2020-11-06 MED ORDER — ATORVASTATIN CALCIUM 10 MG PO TABS: ORAL_TABLET | ORAL | 3 refills | Status: DC

## 2020-11-06 MED ORDER — OZEMPIC (1 MG/DOSE) 4 MG/3ML ~~LOC~~ SOPN: 1.0000 mg | PEN_INJECTOR | SUBCUTANEOUS | 3 refills | Status: DC

## 2020-11-06 MED ORDER — LISINOPRIL 5 MG PO TABS: 5.0000 mg | ORAL_TABLET | Freq: Every day | ORAL | 3 refills | Status: DC

## 2020-11-06 NOTE — Telephone Encounter (Signed)
Requested medication (s) are due for refill today: Yes  Requested medication (s) are on the active medication list: Yes  Last refill:  08/17/20  Future visit scheduled: Yes  Notes to clinic:  Unable to refill per protocol, last refilled by historical provider     Requested Prescriptions  Pending Prescriptions Disp Refills   propranolol (INNOPRAN XL) 80 MG 24 hr capsule      Sig: Take 1 capsule (80 mg total) by mouth once daily      Cardiovascular:  Beta Blockers Passed - 11/06/2020  3:50 PM      Passed - Last BP in normal range    BP Readings from Last 1 Encounters:  11/01/20 132/75          Passed - Last Heart Rate in normal range    Pulse Readings from Last 1 Encounters:  11/01/20 68          Passed - Valid encounter within last 6 months    Recent Outpatient Visits           5 days ago Hyperlipidemia associated with type 2 diabetes mellitus (HCC)   [facility] Althea Charon, Netta Neat, DO   3 months ago Annual physical exam   [facility] Smitty Cords, DO   9 months ago Type 2 diabetes, controlled, with neuropathy [facility])   [facility] Althea Charon, Netta Neat, DO   1 year ago Annual physical exam   [facility] Smitty Cords, DO   1 year ago Diabetic ulcer of toe of right foot associated with type 2 diabetes mellitus, with fat layer exposed [facility])   [facility] [place], Netta Neat, DO

## 2020-11-06 NOTE — Telephone Encounter (Signed)
Medication: propranolol (INNOPRAN XL) 80 MG 24 hr capsule [[id number]]   Has the patient contacted their pharmacy? YES  (Agent: If no, request that the patient contact the pharmacy for the refill.) (Agent: If yes, when and what did the pharmacy advise?)  Preferred Pharmacy (with phone number or street name): [facility] - [place], [place] - [address] [place], [address] [place], [address] [place] [address] Phone: [phone] Fax: [phone] Hours: Not open 24 hours    Agent: Please be advised that RX refills may take up to 3 business days. We ask that you follow-up with your pharmacy.

## 2020-11-06 NOTE — Telephone Encounter (Signed)
Signed.

## 2020-11-06 NOTE — Telephone Encounter (Signed)
Copied from CRM [id number]. Topic: General - Other >> Nov 06, 2020  8:50 AM [name] wrote: Reason for CRM: Patient called in to ask Dr Kirtland Bouchard to please send all his Rx to the [facility] [place], [place] - [name] [place], [address]  Phone:  [phone]  Fax:  [phone]. Per patient he was contacted by [facility] pharmacy but he does not need his medication from them. Can be reached at Ph# [phone]

## 2020-11-06 NOTE — Telephone Encounter (Signed)
Patient is calling regarding his metFORMIN (GLUCOPHAGE) 1000 MG tablet [[id number]]  Patient states that the quantity is wrong. Patient states that instead of 90 it should 180. Please advise [phone] Preferred Pharmacy- [facility]

## 2020-11-06 NOTE — Addendum Note (Signed)
Addended by: Smitty Cords on: 11/06/2020 12:34 PM   Modules accepted: Orders

## 2020-11-08 ENCOUNTER — Other Ambulatory Visit: Payer: Self-pay | Admitting: Family Medicine

## 2020-11-08 ENCOUNTER — Ambulatory Visit: Payer: Medicare HMO | Admitting: Pharmacist

## 2020-11-08 DIAGNOSIS — I10 Essential (primary) hypertension: Secondary | ICD-10-CM

## 2020-11-08 DIAGNOSIS — E1140 Type 2 diabetes mellitus with diabetic neuropathy, unspecified: Secondary | ICD-10-CM

## 2020-11-08 MED ORDER — OZEMPIC (1 MG/DOSE) 4 MG/3ML ~~LOC~~ SOPN: 1.0000 mg | PEN_INJECTOR | SUBCUTANEOUS | 1 refills | Status: DC

## 2020-11-08 NOTE — Chronic Care Management (AMB) (Signed)
Chronic Care Management   Follow Up Note   11/08/2020 Name: Dale [name]. MRN: [id number] DOB: 12-Jan-1961  Referred by: Olin Hauser, DO Reason for referral : Chronic Care Management (Patient Phone Call)   Dale [name]. is a 60 y.o. year old male who is a primary care patient of Olin Hauser, DO. The CCM team was consulted for assistance with chronic disease management and care coordination needs.    I reached out to [name] by phone today.   Review of patient status, including review of consultants reports, relevant laboratory and other test results, and collaboration with appropriate care team members and the patient's provider was performed as part of comprehensive patient evaluation and provision of chronic care management services.    SDOH (Social Determinants of Health) assessments performed: No See Care Plan activities for detailed interventions related to [facility])     Outpatient Encounter Medications as of 11/08/2020  Medication Sig Note  . ARIPiprazole (ABILIFY) 2 MG tablet Take 2 mg by mouth daily.   Marland Kitchen gabapentin (NEURONTIN) 300 MG capsule Take 3 capsules by mouth 3 (three) times daily. 11/08/2020: Reports taking 900 mg in the morning and at noon, and take 1200 mg (4 capsules) nightly as directed by [facility]  . hydrochlorothiazide (HYDRODIURIL) 25 MG tablet Take 25 mg by mouth daily.   Marland Kitchen lisinopril (ZESTRIL) 5 MG tablet Take 1 tablet (5 mg total) by mouth daily.   . propranolol (INNOPRAN XL) 80 MG 24 hr capsule Take 1 capsule (80 mg total) by mouth at bedtime. Take 1 capsule (80 mg total) by mouth once daily   . acetaminophen (TYLENOL) 500 MG tablet Take by mouth.   . Alcohol Swabs (B-D SINGLE USE SWABS REGULAR) PADS Check blood sugar 2 times daily.   Marland Kitchen ALPRAZolam (XANAX) 1 MG tablet Take 1 mg by mouth 3 (three) times daily.   Marland Kitchen aspirin EC 81 MG tablet Take 81 mg by mouth daily.   Marland Kitchen atorvastatin (LIPITOR) 10 MG  tablet TAKE 1 TABLET BY MOUTH DAILY AT 2 PM   . b complex vitamins capsule Take by mouth.   . Blood Glucose Calibration (TRUE METRIX LEVEL 1) Low SOLN Check blood sugar 2 times daily   . Blood Glucose Monitoring Suppl (TRUE METRIX AIR GLUCOSE METER) w/Device KIT Check blood sugar 2 times daily   . buPROPion (WELLBUTRIN XL) 300 MG 24 hr tablet Take 1 tablet (300 mg total) by mouth daily.   . butalbital-acetaminophen-caffeine (FIORICET) 50-325-40 MG tablet Take 1 tablet by mouth daily as needed for headache.   . Cholecalciferol 25 MCG (1000 UT) tablet Take 1,000 Units by mouth.    Marland Kitchen CINNAMON PO Take by mouth.   . Coenzyme Q10 200 MG TABS Take 200 mg by mouth daily.    Marland Kitchen desoximetasone (TOPICORT) 0.25 % cream Apply 1 application topically 2 (two) times daily as needed.   . diclofenac sodium (VOLTAREN) 1 % GEL Apply topically.   . dicyclomine (BENTYL) 20 MG tablet TAKE 1 TABLET (20 MG TOTAL) BY MOUTH EVERY 6 (SIX) HOURS 06/12/2020: As needed  . DULoxetine (CYMBALTA) 30 MG capsule Take 1 capsule (30 mg total) by mouth daily. Take with existing $RemoveBefor'60mg'FTUyCzeNukzb$  dose = total daily dose $RemoveBe'90mg'cLCijLWsb$    . DULoxetine (CYMBALTA) 60 MG capsule Take 1 capsule (60 mg total) by mouth daily. Take with existing $RemoveBefor'30mg'nPKaKUqhPiPK$  dose = total daily dose $RemoveBe'90mg'AsaOZudNE$    . fluticasone (FLONASE) 50 MCG/ACT nasal spray Place  2 sprays into both nostrils daily.   Marland Kitchen ipratropium (ATROVENT) 0.06 % nasal spray Place 2 sprays into both nostrils 4 (four) times daily. For up to 5-7 days then stop.   . meloxicam (MOBIC) 7.5 MG tablet Take 7.5 mg by mouth 2 (two) times daily.    . metFORMIN (GLUCOPHAGE) 1000 MG tablet Take 1 tablet (1,000 mg total) by mouth 2 (two) times daily with a meal.   . Multiple Vitamin (MULTIVITAMIN) tablet Take 1 tablet by mouth daily.   Marland Kitchen NEEDLE, DISP, 18 G (B-D DISP NEEDLE TW 18GX1") 18G X 1" MISC For use to draw up medication into the syringe   . NEEDLE, DISP, 21 G (BD SAFETYGLIDE SHIELDED NEEDLE) 21G X 1-1/2" MISC For use of administering  medication in the IM location   . Omega-3 Fatty Acids (FISH OIL) 1200 MG CAPS Take 1 capsule (1,200 mg total) by mouth daily.   Marland Kitchen OZEMPIC, 1 MG/DOSE, 4 MG/3ML SOPN Inject 1 mg into the skin once a week. (Patient not taking: Reported on 11/08/2020)   . pantoprazole (PROTONIX) 40 MG tablet Take 40 mg by mouth 2 (two) times daily.   . Syringe, Disposable, 3 ML MISC Use syringes as directed by physician   . tamsulosin (FLOMAX) 0.4 MG CAPS capsule TAKE 1 CAPSULE BY MOUTH EVERY DAY   . testosterone cypionate (DEPOTESTOSTERONE CYPIONATE) 200 MG/ML injection Inject 1 mL (200 mg total) into the muscle every 14 (fourteen) days.   Marland Kitchen tiZANidine (ZANAFLEX) 4 MG tablet 1 tablet twice daily, May take an additional 1.5 tablet at bedtime   . traMADol (ULTRAM) 50 MG tablet Take by mouth every 6 (six) hours as needed.   . TRUE METRIX BLOOD GLUCOSE TEST test strip Check blood sugar 2 times a day   . TRUEplus Lancets 33G MISC Check blood sugar 2 times a day   . [DISCONTINUED] azithromycin (ZITHROMAX Z-PAK) 250 MG tablet Take 2 tabs ($Remov'500mg'wGXcjR$  total) on Day 1. Take 1 tab ($Remo'250mg'IpPou$ ) daily for next 4 days. (Patient not taking: Reported on 11/08/2020)    No facility-administered encounter medications on file as of 11/08/2020.    Goals Addressed            This Visit's Progress   . PharmD - Medication Assistance       CARE PLAN ENTRY (see longitudinal plan of care for additional care plan information)  Current Barriers:  . Financial Barriers in complicated patient with multiple medical conditions including T2DM, HTN, peripheral neuropathy, depression, GERD, tremors and chronic pain; patient has [place] 2 insurance . Frustration with current pill burden  Pharmacist Clinical Goal(s):  Marland Kitchen Over the next 30 days, patient will work with PharmD and providers to relieve medication access concerns  Interventions: o Inter-disciplinary care team collaboration (see longitudinal plan of care) o Perform chart  review - Patient seen by [facility] on 12/1 and provider advised patient to increase his gabapentin dose to "take 900 mg in the morning and at noon, and take 1200 mg (4 capsules) nightly" - Patient seen by PCP on 12/29   Due to concern for hypotensive episodes, patient advised to trial off of HCTZ 25 mg and the follow up with PCP via MyChart by 1/3 regarding BP readings  Patient advised to return to clinic ~11/08/2020 for fasting blood work  Type 2 Diabetes . Reports taking: metformin 1000 mg twice daily as directed o Note patient not currently on Ozempic as was unable to afford this medication  while in coverage gap of Medicare coverage in 2021 calendar year and denied for patient assistance. o Patient states will restart taking Ozempic as directed once receives from [facility] . Reports recent fasting CBGs ranging 110s-140s . Reports has been checking nutrition labels and limiting carbohydrate portion sizes . States has started trying to ride his bike again to increase exercise, but reports difficulty with riding due to neuropathy in his legs o Patient plans to follow up with Neurology for recommendations  Medication Assistance . Patient reports has changed his insurance plan to [place] 2 for 2022 calendar year . Reports currently working on having prescriptions moved to [employer] order for cost savings o Patient asks if PCP could send 90 day Rx for Ozempic to mail order. - Will collaborate with PCP . Review formulary for patient's health plan from [employer] with patient to address his questions o Patient asks about cost of generic Abilify. Note aripiprazole is a tier 3 option through patient's plan  Hypertension . Reports currently taking: o Lisinopril 5 mg daily o Propranolol ER 80 mg daily (prescribed by Neurology for tremors) o HCTZ 25 mg daily (reports self-restarted yesterday - see below) . Review home BP readings from patient  today:  AM BP PM BP Notes  29 - December 128/77, HR 70    30 - December  147/82, HR 73 Held HCTZ  31 - December     1 - January     2 - January 147/88, HR 78    3 - January  147/91, HR 74   4 - January  143/83, HR 82 Restarted HCTZ 25 mg QAM  5 - January  145/88, HR 72    . Patient denies following up with PCP to provide BP readings via MyChart as planned . Reports held HCTZ dose as directed by PCP starting on 12/30, but then self-restarted HCTZ on 1/4 as observed home BP readings >140/90  . Denies further episodes of dizziness. States does not have dizziness provided takes position changes slowly . Will follow up with PCP regarding patient's self-restarting of HCTZ and BP results . Counsel patient on BP monitoring technique . Have encouraged patient to continue home BP monitoring, keep log of results, bring record to medical appointments and call for readings outside of established parameters or new symptoms  Medication Management . Encourage patient to return to clinic this week to complete lab work as directed by PCP o Patient states that he will return to clinic for labs tomorrow . Schedule appointment with patient to complete comprehensive medication review and review potential renal dosing adjustment needs based on lab work  . Reports increased gabapentin dose to 900 mg in the morning and at noon, and take 1200 mg (4 capsules) nightly as directed by Pain Management provider. o Denies sedation/dizziness during the day, but admits to increased sedation at beditme. o Encourage patient to use caution for sedation/dizziess. Will follow up further during next telephone appointment   Patient Self Care Activities:  . Patient to attend medical appointments as scheduled  Please see past updates related to this goal by clicking on the "Past Updates" button in the selected goal         Plan  Telephone follow up appointment with care management team member scheduled for: 11/13/2020 at  2:30 pm  Harlow Asa, PharmD, [place] [phone]

## 2020-11-08 NOTE — Patient Instructions (Signed)
Thank you allowing the Chronic Care Management Team to be a part of your care! It was a pleasure speaking with you today!     CCM (Chronic Care Management) Team    Alto Denver RN, MSN, CCM Nurse Care Coordinator  [phone]   Duanne Moron PharmD  Clinical Pharmacist  [phone]   Dickie La LCSW Clinical Social Worker [phone]  Visit Information  Goals Addressed            This Visit's Progress   . PharmD - Medication Assistance       CARE PLAN ENTRY (see longitudinal plan of care for additional care plan information)  Current Barriers:  . Financial Barriers in complicated patient with multiple medical conditions including T2DM, HTN, peripheral neuropathy, depression, GERD, tremors and chronic pain; patient has [employer] [place] Advantage [employer] 2 insurance . Frustration with current pill burden  Pharmacist Clinical Goal(s):  Marland Kitchen Over the next 30 days, patient will work with PharmD and providers to relieve medication access concerns  Interventions: o Inter-disciplinary care team collaboration (see longitudinal plan of care) o Perform chart review - Patient seen by [facility] on 12/1 and provider advised patient to increase his gabapentin dose to "take 900 mg in the morning and at noon, and take 1200 mg (4 capsules) nightly" - Patient seen by PCP on 12/29   Due to concern for hypotensive episodes, patient advised to trial off of HCTZ 25 mg and the follow up with PCP via MyChart by 1/3 regarding BP readings  Patient advised to return to clinic ~11/08/2020 for fasting blood work  Type 2 Diabetes . Reports taking: metformin 1000 mg twice daily as directed o Note patient not currently on Ozempic as was unable to afford this medication while in coverage gap of Medicare coverage in 2021 calendar year and denied for patient assistance. o Patient states will restart taking Ozempic as directed once receives from [facility] . Reports recent fasting CBGs ranging  110s-140s . Reports has been checking nutrition labels and limiting carbohydrate portion sizes . States has started trying to ride his bike again to increase exercise, but reports difficulty with riding due to neuropathy in his legs o Patient plans to follow up with Neurology for recommendations  Medication Assistance . Patient reports has changed his insurance plan to [facility] Advantage Plan 2 for 2022 calendar year . Reports currently working on having prescriptions moved to [employer] order for cost savings o Patient asks if PCP could send 90 day Rx for Ozempic to mail order. - Will collaborate with PCP . Review formulary for patient's health plan from [employer] with patient to address his questions o Patient asks about cost of generic Abilify. Note aripiprazole is a tier 3 option through patient's plan  Hypertension . Reports currently taking: o Lisinopril 5 mg daily o Propranolol ER 80 mg daily (prescribed by Neurology for tremors) o HCTZ 25 mg daily (reports self-restarted yesterday - see below) . Review home BP readings from patient today:  AM BP PM BP Notes  29 - December 128/77, HR 70    30 - December  147/82, HR 73 Held HCTZ  31 - December     1 - January     2 - January 147/88, HR 78    3 - January  147/91, HR 74   4 - January  143/83, HR 82 Restarted HCTZ 25 mg QAM  5 - January  145/88, HR 72    .  Patient denies following up with PCP to provide BP readings via MyChart as planned . Reports held HCTZ dose as directed by PCP starting on 12/30, but then self-restarted HCTZ on 1/4 as observed home BP readings >140/90  . Denies further episodes of dizziness. States does not have dizziness provided takes position changes slowly . Will follow up with PCP regarding patient's self-restarting of HCTZ and BP results . Counsel patient on BP monitoring technique . Have encouraged patient to continue home BP monitoring, keep log of results, bring record to  medical appointments and call for readings outside of established parameters or new symptoms  Medication Management . Encourage patient to return to clinic this week to complete lab work as directed by PCP o Patient states that he will return to clinic for labs tomorrow . Schedule appointment with patient to complete comprehensive medication review and review potential renal dosing adjustment needs based on lab work  . Reports increased gabapentin dose to 900 mg in the morning and at noon, and take 1200 mg (4 capsules) nightly as directed by Pain Management provider. o Denies sedation/dizziness during the day, but admits to increased sedation at beditme. o Encourage patient to use caution for sedation/dizziess. Will follow up further during next telephone appointment   Patient Self Care Activities:  . Patient to attend medical appointments as scheduled  Please see past updates related to this goal by clicking on the "Past Updates" button in the selected goal        The patient verbalized understanding of instructions, educational materials, and care plan provided today and declined offer to receive copy of patient instructions, educational materials, and care plan.   Telephone follow up appointment with care management team member scheduled for: 11/13/2020 at 2:30 pm  Harlow Asa, PharmD, [place] [phone]

## 2020-11-10 ENCOUNTER — Other Ambulatory Visit: Payer: Self-pay

## 2020-11-10 DIAGNOSIS — E1140 Type 2 diabetes mellitus with diabetic neuropathy, unspecified: Secondary | ICD-10-CM

## 2020-11-10 DIAGNOSIS — I10 Essential (primary) hypertension: Secondary | ICD-10-CM

## 2020-11-10 DIAGNOSIS — E1169 Type 2 diabetes mellitus with other specified complication: Secondary | ICD-10-CM

## 2020-11-10 DIAGNOSIS — E669 Obesity, unspecified: Secondary | ICD-10-CM

## 2020-11-11 LAB — LIPID PANEL
Cholesterol: 118 mg/dL (ref <200)
HDL: 37 mg/dL — ABNORMAL LOW (ref >=40)
LDL Cholesterol (Calc): 52 mg/dL (calc)
Non-HDL Cholesterol (Calc): 81 mg/dL (calc) (ref <130)
Total CHOL/HDL Ratio: 3.2 (calc) (ref <5.0)
Triglycerides: 235 mg/dL — ABNORMAL HIGH (ref <150)

## 2020-11-11 LAB — COMPLETE METABOLIC PANEL WITH GFR
AG Ratio: 2 (calc) (ref 1.0–2.5)
ALT: 17 U/L (ref 9–46)
AST: 20 U/L (ref 10–35)
Albumin: 4.3 g/dL (ref 3.6–5.1)
Alkaline phosphatase (APISO): 77 U/L (ref 35–144)
BUN: 16 mg/dL (ref 7–25)
CO2: 30 mmol/L (ref 20–32)
Calcium: 9.8 mg/dL (ref 8.6–10.3)
Chloride: 102 mmol/L (ref 98–110)
Creat: 0.83 mg/dL (ref 0.70–1.25)
GFR, Est African American: 111 mL/min/{1.73_m2} (ref >=60)
GFR, Est Non African American: 96 mL/min/{1.73_m2} (ref >=60)
Globulin: 2.2 g/dL (calc) (ref 1.9–3.7)
Glucose, Bld: 127 mg/dL — ABNORMAL HIGH (ref 65–99)
Potassium: 3.9 mmol/L (ref 3.5–5.3)
Sodium: 139 mmol/L (ref 135–146)
Total Bilirubin: 0.4 mg/dL (ref 0.2–1.2)
Total Protein: 6.5 g/dL (ref 6.1–8.1)

## 2020-11-11 LAB — HEMOGLOBIN A1C
Hgb A1c MFr Bld: 6.3 % of total Hgb — ABNORMAL HIGH (ref <5.7)
Mean Plasma Glucose: 134 mg/dL
eAG (mmol/L): 7.4 mmol/L

## 2020-11-13 ENCOUNTER — Ambulatory Visit: Payer: Self-pay | Admitting: Pharmacist

## 2020-11-13 DIAGNOSIS — I10 Essential (primary) hypertension: Secondary | ICD-10-CM

## 2020-11-13 DIAGNOSIS — E1140 Type 2 diabetes mellitus with diabetic neuropathy, unspecified: Secondary | ICD-10-CM

## 2020-11-13 NOTE — Patient Instructions (Signed)
Visit Information  Patient Care Plan: General Pharmacy (Adult)    Problem Identified: Disease Progression     Long-Range Goal: Disease Progression Prevented or Minimized   Start Date: 11/13/2020  Expected End Date: 02/11/2021  This Visit's Progress: On track  Priority: High  Note:   Current Barriers:  . Financial Barriers in complicated patient with multiple medical conditions including T2DM, HTN, peripheral neuropathy, depression, GERD, tremors and chronic pain; patient has [place] 2 insurance . Frustration with current pill burden  Pharmacist Clinical Goal(s):  Marland Kitchen Over the next 90 days, patient will maintain control of blood sugar as evidenced by A1C >6.5% through collaboration with PharmD and provider.   Interventions: . 1:1 collaboration with Olin Hauser, DO regarding development and update of comprehensive plan of care as evidenced by provider attestation and co-signature . Inter-disciplinary care team collaboration (see longitudinal plan of care) . Comprehensive medication review performed; medication list updated in electronic medical record o Caution for increased risk of dizziness/sedation with alprazolam, gabapentin, tizanidine and tramadol, particularly when taken in combination - Patient verbalizes understanding and reports using caution and taking each only as needed o Note patient currently using both oral NSAID (meloxicam) and topical NSAID (diclofenac gel), in addition to aspirin 81 mg daily - Have counseled on increased GI risks with using topical NSAID with oral NSAID - Reports using diclofenac gel only as needed. Note patient using meloxicam and diclofenac gel as directed by [facility] provider - Note patient taking PPI daily  Type 2 Diabetes . Reports taking: metformin 1000 mg twice daily as directed o Note patient not currently on Ozempic as was unable to afford this medication while in coverage gap of Medicare  coverage in 2021 calendar year and denied for patient assistance. o Patient states will restart taking Ozempic as directed once receives from [facility] - Collaborated with PCP. 90 day supply of Ozempic sent to [facility] . Note latest A1C: Lab Results  Component Value Date   HGBA1C 6.3 (H) 11/10/2020   . Have discussed role of dietary choices and exercise in blood sugar control  Hypertension . Collaborated with PCP last week regarding patient's reported home BP readings and self-restart of HCTZ (see note from 11/08/2020). Provider agreed okay for patient to continue HCTZ 25 mg daily, but to continue to monitor for any future episodes of hypotension or dizziness . Patient currently taking: o Lisinopril 5 mg daily o Propranolol ER 80 mg daily o HCTZ 25 mg daily . Reports BP today: 133/89, HR 66 . Denies further episodes of dizziness. States does not have dizziness provided takes position changes slowly . Counsel patient on BP monitoring technique . Have encouraged patient to continue home BP monitoring, keep log of results, bring record to medical appointments and call for readings outside of established parameters or new symptoms   Patient Goals/Self-Care Activities . Over the next 90 days, patient will:  - take medications as prescribed - check glucose, document, and provide at future appointments - check blood pressure, document, and provide at future appointments  Follow Up Plan: Telephone follow up appointment with care management team member scheduled for: 12/11/2020 at 11:15 am     The patient verbalized understanding of instructions, educational materials, and care plan provided today and declined offer to receive copy of patient instructions, educational materials, and care plan.   Harlow Asa, PharmD, [place] [phone]

## 2020-11-13 NOTE — Chronic Care Management (AMB) (Signed)
Chronic Care Management Pharmacy Note  11/13/2020 Name:  Dale [name]. MRN:  [id number] DOB:  24-Mar-1961  Subjective: Dale [name]. is an 60 y.o. year old male who is a primary patient of Olin Hauser, DO.  The CCM team was consulted for assistance with disease management and care coordination needs.    Engaged with patient by telephone for follow up visit in response to provider referral for pharmacy case management and/or care coordination services.   Consent to Services:  The patient was given information about Chronic Care Management services, agreed to services, and gave verbal consent prior to initiation of services.  Please see initial visit note for detailed documentation.   Objective:  Lab Results  Component Value Date   CREATININE 0.83 11/10/2020   CREATININE 1.07 07/13/2019   CREATININE 0.80 07/08/2018    Lab Results  Component Value Date   HGBA1C 6.3 (H) 11/10/2020       Component Value Date/Time   CHOL 118 11/10/2020 1048   CHOL 104 06/26/2015 1126   TRIG 235 (H) 11/10/2020 1048   HDL 37 (L) 11/10/2020 1048   HDL 30 (L) 06/26/2015 1126   CHOLHDL 3.2 11/10/2020 1048   VLDL 52 (H) 07/18/2016 1041   LDLCALC 52 11/10/2020 1048    BP Readings from Last 3 Encounters:  11/01/20 132/75  07/25/20 100/64  06/28/20 117/77    Assessment/Interventions: Review of patient past medical history, allergies, medications, health status, including review of consultants reports, laboratory and other test data, was performed as part of comprehensive evaluation and provision of chronic care management services.   SDOH:  (Social Determinants of Health) assessments and interventions performed:    CCM Care Plan  Allergies  Allergen Reactions  . Benzoin Other (See Comments)    blisters  . Butrans [Buprenorphine]     Severe Burn pain patch, blisters  . Doxycycline Hyclate Nausea Only  . Morphine Other (See Comments)  . Penicillins Other (See  Comments)  . Tape Rash    Medications Reviewed Today    Reviewed by Vella Raring, [facility] (Pharmacist) on 11/13/20 at 1510  Med List Status: <None>  Medication Order Taking? Sig Documenting Provider Last Dose Status Informant  acetaminophen (TYLENOL) 500 MG tablet [id number] Yes Take 500 mg by mouth daily as needed. [provider] Taking Active   Alcohol Swabs (B-D SINGLE USE SWABS REGULAR) PADS [id number]  Check blood sugar 2 times daily. Karamalegos, Devonne Doughty, DO  Active   ALPRAZolam Duanne Moron) 1 MG tablet [id number] Yes Take 1 mg by mouth 3 (three) times daily as needed. Sherri Rad, MD Taking Active   ARIPiprazole (ABILIFY) 2 MG tablet [id number] Yes Take 2 mg by mouth daily. [provider] Taking Active   aspirin EC 81 MG tablet [id number] Yes Take 81 mg by mouth daily. [provider] Taking Active   atorvastatin (LIPITOR) 10 MG tablet [id number] Yes TAKE 1 TABLET BY MOUTH DAILY AT 2 PM Karamalegos, Devonne Doughty, DO Taking Active   b complex vitamins capsule [id number] Yes Take by mouth. [provider] Taking Active   Blood Glucose Calibration (TRUE METRIX LEVEL 1) Low SOLN [id number]  Check blood sugar 2 times daily Karamalegos, Alexander J, DO  Active   Blood Glucose Monitoring Suppl (TRUE METRIX AIR GLUCOSE METER) w/Device KIT [id number]  Check blood sugar 2 times daily Olin Hauser, DO  Active   buPROPion (WELLBUTRIN XL) 300 MG 24 hr tablet [id number] Yes Take 1 tablet (300  mg total) by mouth daily. Olin Hauser, DO Taking Active   Cholecalciferol 25 MCG (1000 UT) tablet [id number] Yes Take 1,000 Units by mouth.  [provider] Taking Active   CINNAMON PO [id number] Yes Take by mouth. [provider] Taking Active   Coenzyme Q10 100 MG TABS [id number] Yes Take 100 mg by mouth daily. [provider] Taking Active   desoximetasone (TOPICORT) 0.25 % cream [id number] Yes Apply 1 application topically 2 (two)  times daily as needed. Olin Hauser, DO Taking Active   diclofenac sodium (VOLTAREN) 1 % GEL [id number] Yes Apply topically. [provider] Taking Active   dicyclomine (BENTYL) 20 MG tablet [id number] Yes TAKE 1 TABLET (20 MG TOTAL) BY MOUTH EVERY 6 (SIX) HOURS [provider] Taking Active            Med Note [name], Shyenne Maggard A   Mon Jun 12, 2020  9:15 AM) As needed  DULoxetine (CYMBALTA) 30 MG capsule [id number] Yes Take 1 capsule (30 mg total) by mouth daily. Take with existing $RemoveBefor'60mg'crWYJJLWqqPV$  dose = total daily dose $RemoveBe'90mg'qOvrskQZe$  Olin Hauser, DO Taking Active   DULoxetine (CYMBALTA) 60 MG capsule [id number] Yes Take 1 capsule (60 mg total) by mouth daily. Take with existing $RemoveBefor'30mg'fHLKngbPyxJe$  dose = total daily dose $RemoveBe'90mg'vtAhLvYxC$  Karamalegos, Devonne Doughty, DO Taking Active   fluticasone (FLONASE) 50 MCG/ACT nasal spray [id number] No Place 2 sprays into both nostrils daily.  Patient not taking: Reported on 11/13/2020   [provider] Not Taking Active   gabapentin (NEURONTIN) 300 MG capsule [id number] Yes Take 3 capsules by mouth 3 (three) times daily. [provider] Taking Active            Med Note [name], Estaban [place] A   Wed Nov 08, 2020  4:56 PM) Reports taking 900 mg in the morning and at noon, and take 1200 mg (4 capsules) nightly as directed by [facility]  hydrochlorothiazide (HYDRODIURIL) 25 MG tablet [id number] Yes Take 25 mg by mouth daily. [provider] Taking Active   ipratropium (ATROVENT) 0.06 % nasal spray [id number]  Place 2 sprays into both nostrils 4 (four) times daily. For up to 5-7 days then stop. Karamalegos, Devonne Doughty, DO  Active   lisinopril (ZESTRIL) 5 MG tablet [id number] Yes Take 1 tablet (5 mg total) by mouth daily. Olin Hauser, DO Taking Active   meloxicam (MOBIC) 7.5 MG tablet [id number] Yes Take 7.5 mg by mouth 2 (two) times daily.  [provider] Taking Active   metFORMIN (GLUCOPHAGE) 1000 MG tablet  [id number] Yes Take 1 tablet (1,000 mg total) by mouth 2 (two) times daily with a meal. [place], Devonne Doughty, DO Taking Active   Multiple Vitamin (MULTIVITAMIN) tablet [id number] Yes Take 1 tablet by mouth daily. [provider] Taking Active   NEEDLE, DISP, 18 G (B-D DISP NEEDLE TW 18GX1") 18G X 1" MISC [id number]  For use to draw up medication into the syringe Stoioff, Scott C, MD  Active   NEEDLE, DISP, 21 G (BD SAFETYGLIDE SHIELDED NEEDLE) 21G X 1-1/2" [place] [id number]  For use of administering medication in the IM location Stoioff, Ronda Fairly, MD  Active   Omega-3 Fatty Acids (FISH OIL) 1200 MG CAPS [id number] Yes Take 1 capsule (1,200 mg total) by mouth daily.  Patient taking differently: Take 1,200 mg by mouth 2 (two) times daily.   Olin Hauser, DO Taking Active   OZEMPIC, 1 MG/DOSE, 4 MG/3ML SOPN [id number] No Inject 1  mg into the skin once a week.  Patient not taking: Reported on 11/13/2020   Olin Hauser, DO Not Taking Active   pantoprazole (PROTONIX) 40 MG tablet [id number] Yes Take 40 mg by mouth in the morning. [provider] Taking Active   propranolol (INNOPRAN XL) 80 MG 24 hr capsule [id number] Yes Take 1 capsule (80 mg total) by mouth at bedtime. Take 1 capsule (80 mg total) by mouth once daily Olin Hauser, DO Taking Active   Syringe, Disposable, 3 ML MISC [id number]  Use syringes as directed by physician Bernardo Heater, Ronda Fairly, MD  Active   tamsulosin (FLOMAX) 0.4 MG CAPS capsule [id number] Yes TAKE 1 CAPSULE BY MOUTH EVERY DAY Stoioff, Ronda Fairly, MD Taking Active   testosterone cypionate (DEPOTESTOSTERONE CYPIONATE) 200 MG/ML injection [id number] Yes Inject 1 mL (200 mg total) into the muscle every 14 (fourteen) days. Abbie Sons, MD Taking Active   tiZANidine (ZANAFLEX) 4 MG tablet [id number] Yes 1 tablet twice daily, May take an additional 1.5 tablet at bedtime [provider] Taking Active   traMADol (ULTRAM) 50 MG tablet  [id number] Yes Take 50 mg by mouth every 6 (six) hours as needed. [provider] Taking Active   TRUE METRIX BLOOD GLUCOSE TEST test strip [id number]  Check blood sugar 2 times a day Olin Hauser, DO  Active   TRUEplus Lancets 33G MISC [id number]  Check blood sugar 2 times a day Olin Hauser, DO  Active           Patient Active Problem List   Diagnosis Date Noted  . Erectile dysfunction due to arterial insufficiency 06/29/2019  . Hypogonadism in male 06/29/2019  . Pes cavus 04/30/2018  . Chronic pain syndrome 02/18/2018  . Chronic headaches 01/21/2018  . Postoperative CSF leak 10/31/2017  . Moderate episode of recurrent major depressive disorder ([place]) 07/21/2017  . Obesity due to excess calories 08/07/2016  . MCI (mild cognitive impairment) with memory loss 08/06/2016  . Peripheral neuropathy 08/06/2016  . Anxiety 07/17/2016  . Insomnia 07/17/2016  . Eczema 01/22/2016  . Benign essential tremor 10/03/2015  . Glaucoma 06/27/2015  . B12 deficiency 06/27/2015  . Cataplexy and narcolepsy 06/26/2015  . Billowing mitral valve 06/26/2015  . History of prolonged Q-T interval on ECG 06/26/2015  . Herniation of nucleus pulposus 06/26/2015  . Low testosterone 06/26/2015  . Obesity (BMI 30.0-34.9) 06/26/2015  . CAD (coronary artery disease), native coronary artery 06/26/2015  . Fatty infiltration of liver 10/21/2014  . Psoriasis 07/22/2014  . Acid reflux 07/22/2014  . Type 2 diabetes, controlled, with neuropathy ([place]) 06/22/2014  . Hyperlipidemia associated with type 2 diabetes mellitus ([place]) 06/22/2014  . Essential (primary) hypertension 06/22/2014  . Failed back syndrome of lumbar spine 04/29/2014  . Chronic pain associated with significant psychosocial dysfunction 04/29/2014  . History of surgical procedure 04/29/2014  . Polypharmacy 09/09/2013  . Pain medication agreement signed 09/09/2013  . Other long term (current) drug therapy 11/20/1998  .  Encounter for long-term (current) use of other medications 11/20/1998    Conditions to be addressed/monitored:  HTN and DMII  Patient Care Plan: General Pharmacy (Adult)    Problem Identified: Disease Progression     Long-Range Goal: Disease Progression Prevented or Minimized   Start Date: 11/13/2020  Expected End Date: 02/11/2021  This Visit's Progress: On track  Priority: High  Note:   Current Barriers:  . Financial Barriers in complicated patient with multiple medical conditions including T2DM, HTN, peripheral neuropathy, depression,  GERD, tremors and chronic pain; patient has [employer] insurance . Frustration with current pill burden  Pharmacist Clinical Goal(s):  Marland Kitchen Over the next 90 days, patient will maintain control of blood sugar as evidenced by A1C >6.5% through collaboration with PharmD and provider.   Interventions: . 1:1 collaboration with Olin Hauser, DO regarding development and update of comprehensive plan of care as evidenced by provider attestation and co-signature . Inter-disciplinary care team collaboration (see longitudinal plan of care) . Comprehensive medication review performed; medication list updated in electronic medical record o Caution for increased risk of dizziness/sedation with alprazolam, gabapentin, tizanidine and tramadol, particularly when taken in combination - Patient verbalizes understanding and reports using caution and taking each only as needed o Note patient currently using both oral NSAID (meloxicam) and topical NSAID (diclofenac gel), in addition to aspirin 81 mg daily - Have counseled on increased GI risks with using topical NSAID with oral NSAID - Reports using diclofenac gel only as needed. Note patient using meloxicam and diclofenac gel as directed by [facility] provider - Note patient taking PPI daily  Type 2 Diabetes . Reports taking: metformin 1000 mg twice daily as  directed o Note patient not currently on Ozempic as was unable to afford this medication while in coverage gap of Medicare coverage in 2021 calendar year and denied for patient assistance. o Patient states will restart taking Ozempic as directed once receives from [facility] - Collaborated with PCP. 90 day supply of Ozempic sent to [facility] . Note latest A1C: Lab Results  Component Value Date   HGBA1C 6.3 (H) 11/10/2020   . Have discussed role of dietary choices and exercise in blood sugar control  Hypertension . Collaborated with PCP last week regarding patient's reported home BP readings and self-restart of HCTZ (see note from 11/08/2020). Provider agreed okay for patient to continue HCTZ 25 mg daily, but to continue to monitor for any future episodes of hypotension or dizziness . Patient currently taking: o Lisinopril 5 mg daily o Propranolol ER 80 mg daily o HCTZ 25 mg daily . Reports BP today: 133/89, HR 66 . Denies further episodes of dizziness. States does not have dizziness provided takes position changes slowly . Counsel patient on BP monitoring technique . Have encouraged patient to continue home BP monitoring, keep log of results, bring record to medical appointments and call for readings outside of established parameters or new symptoms   Patient Goals/Self-Care Activities . Over the next 90 days, patient will:  - take medications as prescribed - check glucose, document, and provide at future appointments - check blood pressure, document, and provide at future appointments  Follow Up Plan: Telephone follow up appointment with care management team member scheduled for: 12/11/2020 at 11:15 am     Follow Up:  Patient agrees to Care Plan and Follow-up.  Harlow Asa, PharmD, [place] [phone]

## 2020-11-21 ENCOUNTER — Other Ambulatory Visit: Payer: Self-pay

## 2020-11-21 DIAGNOSIS — L309 Dermatitis, unspecified: Secondary | ICD-10-CM

## 2020-11-21 MED ORDER — DESOXIMETASONE 0.25 % EX CREA: 1.0000 "application " | TOPICAL_CREAM | Freq: Two times a day (BID) | CUTANEOUS | 2 refills | Status: DC | PRN

## 2020-11-27 ENCOUNTER — Other Ambulatory Visit: Payer: Self-pay

## 2020-11-27 ENCOUNTER — Encounter: Payer: Self-pay | Admitting: Family Medicine

## 2020-11-27 ENCOUNTER — Ambulatory Visit (INDEPENDENT_AMBULATORY_CARE_PROVIDER_SITE_OTHER): Payer: Medicare Other | Admitting: Family Medicine

## 2020-11-27 VITALS — BP 138/81 | HR 73 | Ht 70.0 in | Wt 220.0 lb

## 2020-11-27 DIAGNOSIS — I10 Essential (primary) hypertension: Secondary | ICD-10-CM | POA: Diagnosis not present

## 2020-11-27 DIAGNOSIS — G250 Essential tremor: Secondary | ICD-10-CM | POA: Diagnosis not present

## 2020-11-27 MED ORDER — PROPRANOLOL HCL 40 MG PO TABS: 40.0000 mg | ORAL_TABLET | Freq: Three times a day (TID) | ORAL | 0 refills | Status: DC

## 2020-11-27 NOTE — Patient Instructions (Addendum)
Stop Propranolol XL 80 Start Propranolol 40mg  IR - immediate release - 3 times a day = 120 mg for 24 hours, overall slightly higher dose for BP and tremors Future can adjust further  OR can increase Lisinopril and reduce HCTZ fluid pill again.  Please schedule a Follow-up Appointment to: Return if symptoms worsen or fail to improve.  If you have any other questions or concerns, please feel free to call the office or send a message through [place]. You may also schedule an earlier appointment if necessary.  Additionally, you may be receiving a survey about your experience at our office within a few days to 1 week by e-mail or mail. We value your feedback.  [name], DO [place]

## 2020-11-27 NOTE — Progress Notes (Signed)
Subjective:    Patient ID: Dale Bunting., male    DOB: 07/09/1961, 60 y.o.   MRN: [id number]  Dale Capstick. is a 60 y.o. male presenting on 11/27/2020 for Hypertension and Headache   HPI   CHRONIC HTN: Interval update, last 12/29 - we HELD HCTZ 25mg  as trial due to lightheadedness dizziness, that did not change this symptom, Dale [name] resumed med, then has had high BP >140-150 on home readings, high of 170. Admits headache associated with BP only. Not every time. Current Meds - Lisinopril 5mg  daily, HCTZ 25mg  daily, Propranolol XL 80mg  daily   Reports good compliance, took meds today. Tolerating well, w/o complaints. Denies CP, dyspnea, HA, edema, dizziness / lightheadedness  Essential Tremor Followed by Dr Lanelle Bal Neuro Last apt 08/2020, his dose of Propranolol XL was increased, Dale [name] has still had tremors breakthrough, temporarily relieved on this med, but not lasting but a few hours.  Additionally Dale [name] asks about Kidney renal cysts on prior imaging, reviewed CT 2019 and [place] in past with patient.   Depression screen [facility] 2/9 07/25/2020 01/19/2020 07/20/2019  Decreased Interest 2 1 0  Down, Depressed, Hopeless 1 1 0  PHQ - 2 Score 3 2 0  Altered sleeping 0 0 3  Tired, decreased energy 1 2 3   Change in appetite 0 0 0  Feeling bad or failure about yourself  0 0 0  Trouble concentrating 0 1 0  Moving slowly or fidgety/restless 0 0 0  Suicidal thoughts 0 0 0  PHQ-9 Score 4 5 6   Difficult doing work/chores Somewhat difficult Somewhat difficult Not difficult at all  Some recent data might be hidden    Social History   Tobacco Use  . Smoking status: Former Smoker    Types: Cigarettes    Start date: 08/29/1979    Quit date: 11/04/2010    Years since quitting: 10.0  . Smokeless tobacco: Former Systems developer    Quit date: 11/19/2010  . Tobacco comment: 1 to 1.5 packs per day  Vaping Use  . Vaping Use: Never used  Substance Use Topics  . Alcohol use: No    Alcohol/week: 0.0 standard  drinks  . Drug use: No    Review of Systems Per HPI unless specifically indicated above     Objective:    BP 138/81   Pulse 73   Ht 5\' 10"  (1.778 m)   Wt 220 lb (99.8 kg)   SpO2 100%   BMI 31.57 kg/m   Wt Readings from Last 3 Encounters:  11/27/20 220 lb (99.8 kg)  11/01/20 221 lb (100.2 kg)  07/25/20 213 lb 9.6 oz (96.9 kg)    Physical Exam Vitals and nursing note reviewed.  Constitutional:      General: Dale [name] is not in acute distress.    Appearance: Dale [name] is well-developed and well-nourished. Dale [name] is not diaphoretic.     Comments: Well-appearing, comfortable, cooperative  HENT:     Head: Normocephalic and atraumatic.     Mouth/Throat:     Mouth: Oropharynx is clear and moist.  Eyes:     General:        Right eye: No discharge.        Left eye: No discharge.     Conjunctiva/sclera: Conjunctivae normal.  Cardiovascular:     Rate and Rhythm: Normal rate.  Pulmonary:     Effort: Pulmonary effort is normal.  Musculoskeletal:        General: No edema.  Skin:  General: Skin is warm and dry.     Findings: No erythema or rash.  Neurological:     Mental Status: Dale [name] is alert and oriented to person, place, and time.  Psychiatric:        Mood and Affect: Mood and affect normal.        Behavior: Behavior normal.     Comments: Well groomed, good eye contact, normal speech and thoughts    Results for orders placed or performed in visit on 11/10/20  Lipid panel  Result Value Ref Range   Cholesterol 118 <200 mg/dL   HDL 37 (L) > OR = 40 mg/dL   Triglycerides 235 (H) <150 mg/dL   LDL Cholesterol (Calc) 52 mg/dL (calc)   Total CHOL/HDL Ratio 3.2 <5.0 (calc)   Non-HDL Cholesterol (Calc) 81 <130 mg/dL (calc)  COMPLETE METABOLIC PANEL WITH GFR  Result Value Ref Range   Glucose, Bld 127 (H) 65 - 99 mg/dL   BUN 16 7 - 25 mg/dL   Creat 0.83 0.70 - 1.25 mg/dL   GFR, Est Non African American 96 > OR = 60 mL/min/1.13m2   GFR, Est African American 111 > OR = 60 mL/min/1.52m2    BUN/Creatinine Ratio NOT APPLICABLE 6 - 22 (calc)   Sodium 139 135 - 146 mmol/L   Potassium 3.9 3.5 - 5.3 mmol/L   Chloride 102 98 - 110 mmol/L   CO2 30 20 - 32 mmol/L   Calcium 9.8 8.6 - 10.3 mg/dL   Total Protein 6.5 6.1 - 8.1 g/dL   Albumin 4.3 3.6 - 5.1 g/dL   Globulin 2.2 1.9 - 3.7 g/dL (calc)   AG Ratio 2.0 1.0 - 2.5 (calc)   Total Bilirubin 0.4 0.2 - 1.2 mg/dL   Alkaline phosphatase (APISO) 77 35 - 144 U/L   AST 20 10 - 35 U/L   ALT 17 9 - 46 U/L  Hemoglobin A1c  Result Value Ref Range   Hgb A1c MFr Bld 6.3 (H) <5.7 % of total Hgb   Mean Plasma Glucose 134 mg/dL   eAG (mmol/L) 7.4 mmol/L      Assessment & Plan:   Problem List Items Addressed This Visit    Essential (primary) hypertension - Primary    Elevated BP readings at home, calibrated cuff prior, assoc with headaches Now improved lightheadedness    Plan:  1. Increase Propranolol / switch dose from XL to IR. Previously Propranolol XL 80mg  daily per Neurology for Tremors. Now will switch to Propranolol IR 40mg  TID for total daily mg in 24 hours = 120mg , so increased dose can help tremors and maybe IR will have better tremor coverage and help BP control. - Future reconsider 120mg  XL, vs can do 60mg  TID in future if prefer - Future consideration, may increase Lisinopril from 5 to 10 or 20mg , for HTN control and can reduce HCTZ to 12.5 or DC in future if indicated  2. Encourage improved lifestyle - low sodium diet, regular exercise 3. Continue monitor BP outside office, bring readings to next visit, if persistently >140/90 or new symptoms notify office sooner      Relevant Medications   propranolol (INDERAL) 40 MG tablet   Benign essential tremor    Chronic problem See A&P HTN Followed by Dr Lanelle Bal Neuro Will adjust his Propranolol now to IR and inc dose, since XL is fading effect           Meds ordered this encounter  Medications  . propranolol (INDERAL) 40  MG tablet    Sig: Take 1 tablet (40 mg total)  by mouth 3 (three) times daily.    Dispense:  90 tablet    Refill:  0    30 day supply only, adjusting dose      Follow up plan: Return if symptoms worsen or fail to improve.   [name], [facility] 11/27/2020, 12:01 PM

## 2020-11-27 NOTE — Assessment & Plan Note (Signed)
Chronic problem See A&P HTN Followed by Dr Lanelle Bal Neuro Will adjust his Propranolol now to IR and inc dose, since XL is fading effect

## 2020-11-27 NOTE — Assessment & Plan Note (Signed)
Elevated BP readings at home, calibrated cuff prior, assoc with headaches Now improved lightheadedness    Plan:  1. Increase Propranolol / switch dose from XL to IR. Previously Propranolol XL 80mg  daily per Neurology for Tremors. Now will switch to Propranolol IR 40mg  TID for total daily mg in 24 hours = 120mg , so increased dose can help tremors and maybe IR will have better tremor coverage and help BP control. - Future reconsider 120mg  XL, vs can do 60mg  TID in future if prefer - Future consideration, may increase Lisinopril from 5 to 10 or 20mg , for HTN control and can reduce HCTZ to 12.5 or DC in future if indicated  2. Encourage improved lifestyle - low sodium diet, regular exercise 3. Continue monitor BP outside office, bring readings to next visit, if persistently >140/90 or new symptoms notify office sooner

## 2020-11-30 ENCOUNTER — Telehealth: Payer: Self-pay

## 2020-11-30 NOTE — Chronic Care Management (AMB) (Signed)
  Care Management   Note  11/30/2020 Name: Dale [name]. MRN: [id number] DOB: [birth year]  [name] is a 60 y.o. year old male who is a primary care patient of Olin Hauser, DO and is actively engaged with the care management team. I reached out to [name] by phone today to assist with re-scheduling a follow up visit with the Pharmacist  Follow up plan: Unsuccessful telephone outreach attempt made. A HIPAA compliant phone message was left for the patient providing contact information and requesting a return call.  The care management team will reach out to the patient again over the next 5 days.  If patient returns call to provider office, please advise to call [place]  at [place], [place], [place], [place] [address] Direct Dial: [phone] [email or web address] Website: [email or web address]

## 2020-12-04 NOTE — Chronic Care Management (AMB) (Unsigned)
  Care Management   Note  12/04/2020 Name: Dale [name]. MRN: [id number] DOB: [birth year]  Dale [name]. is a 60 y.o. year old male who is a primary care patient of Olin Hauser, DO and is actively engaged with the care management team. I reached out to Dale [name]. by phone today to assist with re-scheduling a follow up visit with the Pharmacist  Follow up plan: Unsuccessful telephone outreach attempt made. A HIPAA compliant phone message was left for the patient providing contact information and requesting a return call.  The care management team will reach out to the patient again over the next 5 days.  If patient returns call to provider office, please advise to call [place]  at [place], [place], [place], [place] [address] Direct Dial: [phone] [email or web address]: [email or web address]

## 2020-12-06 NOTE — Chronic Care Management (AMB) (Signed)
  Care Management   Note  12/06/2020 Name: Dale [name]. MRN: [id number] DOB: 05-03-1961  Dale [name]. is a 60 y.o. year old male who is a primary care patient of Olin Hauser, DO and is actively engaged with the care management team. I reached out to Dale [name]. by phone today to assist with re-scheduling a follow up visit with the Pharmacist  Follow up plan: Telephone appointment with care management team member scheduled for:12/20/2020  Noreene Larsson, [place], [place], [place] [address] Direct Dial: [phone] [email or web address]: [email or web address]

## 2020-12-11 ENCOUNTER — Telehealth: Payer: Self-pay

## 2020-12-12 ENCOUNTER — Telehealth: Payer: Self-pay

## 2020-12-12 ENCOUNTER — Telehealth: Payer: Self-pay | Admitting: Licensed Clinical Social Worker

## 2020-12-12 NOTE — Telephone Encounter (Signed)
Chronic Care Management    Clinical Social Work General Follow Up Note  12/12/2020 Name: Dale [name]. MRN: [id number] DOB: 1961-08-14  Dale [name]. is a 60 y.o. year old male who is a primary care patient of Olin Hauser, DO. The CCM team was consulted for assistance with Mental Health Counseling and Resources.   Review of patient status, including review of consultants reports, relevant laboratory and other test results, and collaboration with appropriate care team members and the patient's provider was performed as part of comprehensive patient evaluation and provision of chronic care management services.    LCSW completed CCM outreach attempt today but was unable to reach patient successfully. A HIPPA compliant voice message was left encouraging patient to return call once available. LCSW will ask Scheduling Care Guide to reschedule CCM SW appointment with patient as well.   Outpatient Encounter Medications as of 12/12/2020  Medication Sig Note  . acetaminophen (TYLENOL) 500 MG tablet Take 500 mg by mouth daily as needed.   . Alcohol Swabs (B-D SINGLE USE SWABS REGULAR) PADS Check blood sugar 2 times daily.   Marland Kitchen ALPRAZolam (XANAX) 1 MG tablet Take 1 mg by mouth 3 (three) times daily as needed.   . ARIPiprazole (ABILIFY) 2 MG tablet Take 2 mg by mouth daily.   Marland Kitchen aspirin EC 81 MG tablet Take 81 mg by mouth daily.   Marland Kitchen atorvastatin (LIPITOR) 10 MG tablet TAKE 1 TABLET BY MOUTH DAILY AT 2 PM   . b complex vitamins capsule Take by mouth.   . Blood Glucose Calibration (TRUE METRIX LEVEL 1) Low SOLN Check blood sugar 2 times daily   . Blood Glucose Monitoring Suppl (TRUE METRIX AIR GLUCOSE METER) w/Device KIT Check blood sugar 2 times daily   . buPROPion (WELLBUTRIN XL) 300 MG 24 hr tablet Take 1 tablet (300 mg total) by mouth daily.   . Cholecalciferol 25 MCG (1000 UT) tablet Take 1,000 Units by mouth.    Marland Kitchen CINNAMON PO Take by mouth.   . Coenzyme Q10 100 MG TABS Take  100 mg by mouth daily.   Marland Kitchen desoximetasone (TOPICORT) 0.25 % cream Apply 1 application topically 2 (two) times daily as needed.   . diclofenac sodium (VOLTAREN) 1 % GEL Apply topically.   . dicyclomine (BENTYL) 20 MG tablet TAKE 1 TABLET (20 MG TOTAL) BY MOUTH EVERY 6 (SIX) HOURS 06/12/2020: As needed  . DULoxetine (CYMBALTA) 30 MG capsule Take 1 capsule (30 mg total) by mouth daily. Take with existing $RemoveBefor'60mg'hbEDkKpgilsB$  dose = total daily dose $RemoveBe'90mg'IfTUlkEHO$    . DULoxetine (CYMBALTA) 60 MG capsule Take 1 capsule (60 mg total) by mouth daily. Take with existing $RemoveBefor'30mg'AxhZlnjRXbnx$  dose = total daily dose $RemoveBe'90mg'SpBAWfeOe$    . fluticasone (FLONASE) 50 MCG/ACT nasal spray Place 2 sprays into both nostrils daily.   Marland Kitchen gabapentin (NEURONTIN) 300 MG capsule Take 3 capsules by mouth 3 (three) times daily. 11/08/2020: Reports taking 900 mg in the morning and at noon, and take 1200 mg (4 capsules) nightly as directed by [facility]  . hydrochlorothiazide (HYDRODIURIL) 25 MG tablet Take 25 mg by mouth daily.   Marland Kitchen ipratropium (ATROVENT) 0.06 % nasal spray Place 2 sprays into both nostrils 4 (four) times daily. For up to 5-7 days then stop.   Marland Kitchen lisinopril (ZESTRIL) 5 MG tablet Take 1 tablet (5 mg total) by mouth daily.   . meloxicam (MOBIC) 7.5 MG tablet Take 7.5 mg by mouth 2 (two) times daily.    Marland Kitchen  metFORMIN (GLUCOPHAGE) 1000 MG tablet Take 1 tablet (1,000 mg total) by mouth 2 (two) times daily with a meal.   . Multiple Vitamin (MULTIVITAMIN) tablet Take 1 tablet by mouth daily.   Marland Kitchen NEEDLE, DISP, 18 G (B-D DISP NEEDLE TW 18GX1") 18G X 1" MISC For use to draw up medication into the syringe   . NEEDLE, DISP, 21 G (BD SAFETYGLIDE SHIELDED NEEDLE) 21G X 1-1/2" MISC For use of administering medication in the IM location   . Omega-3 Fatty Acids (FISH OIL) 1200 MG CAPS Take 1 capsule (1,200 mg total) by mouth daily. (Patient taking differently: Take 1,200 mg by mouth 2 (two) times daily.)   . OZEMPIC, 1 MG/DOSE, 4 MG/3ML SOPN Inject 1 mg into the skin once a  week.   . pantoprazole (PROTONIX) 40 MG tablet Take 40 mg by mouth in the morning.   . propranolol (INDERAL) 40 MG tablet Take 1 tablet (40 mg total) by mouth 3 (three) times daily.   . SODIUM FLUORIDE 5000 SENSITIVE 1.1-5 % GEL Take by mouth.   . Syringe, Disposable, 3 ML MISC Use syringes as directed by physician   . tamsulosin (FLOMAX) 0.4 MG CAPS capsule TAKE 1 CAPSULE BY MOUTH EVERY DAY   . testosterone cypionate (DEPOTESTOSTERONE CYPIONATE) 200 MG/ML injection Inject 1 mL (200 mg total) into the muscle every 14 (fourteen) days.   Marland Kitchen tiZANidine (ZANAFLEX) 4 MG tablet 1 tablet twice daily, May take an additional 1.5 tablet at bedtime   . traMADol (ULTRAM) 50 MG tablet Take 50 mg by mouth every 6 (six) hours as needed.   . TRUE METRIX BLOOD GLUCOSE TEST test strip Check blood sugar 2 times a day   . TRUEplus Lancets 33G MISC Check blood sugar 2 times a day    No facility-administered encounter medications on file as of 12/12/2020.    Eula Fried, BSW, MSW, [email or web address] Phone: [phone]

## 2020-12-14 ENCOUNTER — Telehealth: Payer: Self-pay

## 2020-12-14 NOTE — Chronic Care Management (AMB) (Signed)
  Care Management   Note  12/14/2020 Name: Dale [name]. MRN: [id number] DOB: 1961/01/10  Dale [name]. is a 60 y.o. year old male who is a primary care patient of Olin Hauser, DO and is actively engaged with the care management team. I reached out to Dale [name]. by phone today to assist with re-scheduling a follow up visit with the Licensed Clinical Social Worker  Follow up plan: Unsuccessful telephone outreach attempt made. A HIPAA compliant phone message was left for the patient providing contact information and requesting a return call.  The care management team will reach out to the patient again over the next 7 days.  If patient returns call to provider office, please advise to call [facility]  at [place], [place], [place], [place] [address] Direct Dial: [phone] [email or web address]

## 2020-12-20 ENCOUNTER — Other Ambulatory Visit: Payer: Self-pay | Admitting: Family Medicine

## 2020-12-20 ENCOUNTER — Ambulatory Visit (INDEPENDENT_AMBULATORY_CARE_PROVIDER_SITE_OTHER): Payer: Medicare Other | Admitting: Pharmacist

## 2020-12-20 DIAGNOSIS — E1140 Type 2 diabetes mellitus with diabetic neuropathy, unspecified: Secondary | ICD-10-CM

## 2020-12-20 DIAGNOSIS — G250 Essential tremor: Secondary | ICD-10-CM

## 2020-12-20 DIAGNOSIS — I10 Essential (primary) hypertension: Secondary | ICD-10-CM

## 2020-12-20 NOTE — Patient Instructions (Signed)
Visit Information  PATIENT GOALS: Goals Addressed            This Visit's Progress   . Pharmacy Goals       Please check your blood pressure, keep record of results (I am mailing you a log) and bring this record with you to medical appointments.  Our goal bad cholesterol, or LDL, is less than 70 . This is why it is important to continue taking your atorvastatin  Feel free to call me with any questions or concerns. I look forward to our next call!       The patient verbalized understanding of instructions, educational materials, and care plan provided today and declined offer to receive copy of patient instructions, educational materials, and care plan.   Telephone follow up appointment with care management team member scheduled for: 5/16 at 1:15 pm  Harlow Asa, PharmD, [place] [phone]

## 2020-12-20 NOTE — Chronic Care Management (AMB) (Signed)
Chronic Care Management Pharmacy Note  12/20/2020 Name:  Dale [name]. MRN:  [id number] DOB:  1961/05/24  Subjective: Dale [name]. is an 60 y.o. year old male who is a primary patient of Olin Hauser, DO.  The CCM team was consulted for assistance with disease management and care coordination needs.    Engaged with patient by telephone for follow up visit in response to provider referral for pharmacy case management and/or care coordination services.   Consent to Services:  The patient was given information about Chronic Care Management services, agreed to services, and gave verbal consent prior to initiation of services.  Please see initial visit note for detailed documentation.   Objective:  Lab Results  Component Value Date   CREATININE 0.83 11/10/2020   CREATININE 1.07 07/13/2019   CREATININE 0.80 07/08/2018    Lab Results  Component Value Date   HGBA1C 6.3 (H) 11/10/2020       Component Value Date/Time   CHOL 118 11/10/2020 1048   CHOL 104 06/26/2015 1126   TRIG 235 (H) 11/10/2020 1048   HDL 37 (L) 11/10/2020 1048   HDL 30 (L) 06/26/2015 1126   CHOLHDL 3.2 11/10/2020 1048   VLDL 52 (H) 07/18/2016 1041   LDLCALC 52 11/10/2020 1048    BP Readings from Last 3 Encounters:  11/27/20 138/81  11/01/20 132/75  07/25/20 100/64    Assessment: Review of patient past medical history, allergies, medications, health status, including review of consultants reports, laboratory and other test data, was performed as part of comprehensive evaluation and provision of chronic care management services.   SDOH:  (Social Determinants of Health) assessments and interventions performed: none   CCM Care Plan  Allergies  Allergen Reactions  . Benzoin Other (See Comments)    blisters  . Butrans [Buprenorphine]     Severe Burn pain patch, blisters  . Doxycycline Hyclate Nausea Only  . Morphine Other (See Comments)  . Penicillins Other (See Comments)  .  Tape Rash    Medications Reviewed Today    Reviewed by Olin Hauser, DO (Physician) on 11/27/20 at 1201  Med List Status: <None>  Medication Order Taking? Sig Documenting Provider Last Dose Status Informant  acetaminophen (TYLENOL) 500 MG tablet [id number] Yes Take 500 mg by mouth daily as needed. [provider] Taking Active   Alcohol Swabs (B-D SINGLE USE SWABS REGULAR) PADS [id number] Yes Check blood sugar 2 times daily. Olin Hauser, DO Taking Active   ALPRAZolam Duanne Moron) 1 MG tablet [id number] Yes Take 1 mg by mouth 3 (three) times daily as needed. Sherri Rad, MD Taking Active   ARIPiprazole (ABILIFY) 2 MG tablet [id number] Yes Take 2 mg by mouth daily. [provider] Taking Active   aspirin EC 81 MG tablet [id number] Yes Take 81 mg by mouth daily. [provider] Taking Active   atorvastatin (LIPITOR) 10 MG tablet [id number] Yes TAKE 1 TABLET BY MOUTH DAILY AT 2 PM Karamalegos, Devonne Doughty, DO Taking Active   b complex vitamins capsule [id number] Yes Take by mouth. [provider] Taking Active   Blood Glucose Calibration (TRUE METRIX LEVEL 1) Low SOLN [id number] Yes Check blood sugar 2 times daily Olin Hauser, DO Taking Active   Blood Glucose Monitoring Suppl (TRUE METRIX AIR GLUCOSE METER) w/Device KIT [id number] Yes Check blood sugar 2 times daily Olin Hauser, DO Taking Active   buPROPion (WELLBUTRIN XL) 300 MG 24 hr tablet [id number] Yes Take 1 tablet (300  mg total) by mouth daily. Olin Hauser, DO Taking Active   Cholecalciferol 25 MCG (1000 UT) tablet [id number] Yes Take 1,000 Units by mouth.  [provider] Taking Active   CINNAMON PO [id number] Yes Take by mouth. [provider] Taking Active   Coenzyme Q10 100 MG TABS [id number] Yes Take 100 mg by mouth daily. [provider] Taking Active   desoximetasone (TOPICORT) 0.25 % cream [id number] Yes Apply 1 application  topically 2 (two) times daily as needed. Olin Hauser, DO Taking Active   diclofenac sodium (VOLTAREN) 1 % GEL [id number] Yes Apply topically. [provider] Taking Active   dicyclomine (BENTYL) 20 MG tablet [id number] Yes TAKE 1 TABLET (20 MG TOTAL) BY MOUTH EVERY 6 (SIX) HOURS [provider] Taking Active            Med Note [name], Satoru Milich A   Mon Jun 12, 2020  9:15 AM) As needed  DULoxetine (CYMBALTA) 30 MG capsule [id number] Yes Take 1 capsule (30 mg total) by mouth daily. Take with existing 49m dose = total daily dose 975mKaramalegos, AlDevonne DoughtyDO Taking Active   DULoxetine (CYMBALTA) 60 MG capsule [id number] Take 1 capsule (60 mg total) by mouth daily. Take with existing 3068mose = total daily dose 88m44mramalegos, AlexDevonne Doughty Taking Active   fluticasone (FLONASE) 50 MCG/ACT nasal spray [id number] Place 2 sprays into both nostrils daily. [provider] Taking Active   gabapentin (NEURONTIN) 300 MG capsule [id number] Take 3 capsules by mouth 3 (three) times daily. [provider] Taking Active            Med Note (DHAWinfield CunasISABETH A   Wed Nov 08, 2020  4:56 PM) Reports taking 900 mg in the morning and at noon, and take 1200 mg (4 capsules) nightly as directed by [facility]  hydrochlorothiazide (HYDRODIURIL) 25 MG tablet [id number] Take 25 mg by mouth daily. [provider] Taking Active   ipratropium (ATROVENT) 0.06 % nasal spray [id number] Place 2 sprays into both nostrils 4 (four) times daily. For up to 5-7 days then stop. KaraOlin Hauser Taking Active   lisinopril (ZESTRIL) 5 MG tablet [id number] Take 1 tablet (5 mg total) by mouth daily. KaraOlin Hauser Taking Active   meloxicam (MOBIC) 7.5 MG tablet [id number] Take 7.5 mg by mouth 2 (two) times daily.  [provider] Taking Active   metFORMIN (GLUCOPHAGE) 1000 MG tablet [id number] Take 1 tablet  (1,000 mg total) by mouth 2 (two) times daily with a meal. KaraParks RangerexDevonne Doughty Taking Active   Multiple Vitamin (MULTIVITAMIN) tablet [id number] Take 1 tablet by mouth daily. [provider] Taking Active   NEEDLE, DISP, 18 G (B-D DISP NEEDLE TW 18GX1") 18G X 1" [id number] For use to draw up medication into the syringe Stoioff, ScotRonda Fairly Taking Active   NEEDLE, DISP, 21 G (BD SAFETYGLIDE SHIELDED NEEDLE) 21G X 1-1/2" MISCEast Richmond [id number] For use of administering medication in the IM location Stoioff, ScotRonda Fairly Taking Active   Omega-3 Fatty Acids (FISH OIL) 1200 MG CAPS [id number] Take 1 capsule (1,200 mg total) by mouth daily.  Patient taking differently: Take 1,200 mg by mouth 2 (two) times daily.   KaraOlin Hauser Taking Active   OZEMPIC, 1 MG/DOSE, 4 MG/3ML SOPN [id number] Inject 1 mg into the skin once a week. KaraParks RangerexDevonne Doughty  DO Taking Active   pantoprazole (PROTONIX) 40 MG tablet [id number] Yes Take 40 mg by mouth in the morning. [provider] Taking Active   propranolol (INDERAL) 40 MG tablet [id number] Yes Take 1 tablet (40 mg total) by mouth 3 (three) times daily. Olin Hauser, DO Taking Active   SODIUM FLUORIDE 5000 SENSITIVE 1.1-5 % GEL [id number] Yes Take by mouth. [provider] Taking Active   Syringe, Disposable, 3 ML MISC [id number] Yes Use syringes as directed by physician Abbie Sons, MD Taking Active   tamsulosin (FLOMAX) 0.4 MG CAPS capsule [id number] Yes TAKE 1 CAPSULE BY MOUTH EVERY DAY Stoioff, Ronda Fairly, MD Taking Active   testosterone cypionate (DEPOTESTOSTERONE CYPIONATE) 200 MG/ML injection [id number] Yes Inject 1 mL (200 mg total) into the muscle every 14 (fourteen) days. Abbie Sons, MD Taking Active   tiZANidine (ZANAFLEX) 4 MG tablet [id number] Yes 1 tablet twice daily, May take an additional 1.5 tablet at bedtime [provider] Taking Active   traMADol (ULTRAM) 50 MG  tablet [id number] Yes Take 50 mg by mouth every 6 (six) hours as needed. [provider] Taking Active   TRUE METRIX BLOOD GLUCOSE TEST test strip [id number] Yes Check blood sugar 2 times a day Olin Hauser, DO Taking Active   TRUEplus Lancets 33G MISC [id number] Yes Check blood sugar 2 times a day Olin Hauser, DO Taking Active           Patient Active Problem List   Diagnosis Date Noted  . Erectile dysfunction due to arterial insufficiency 06/29/2019  . Hypogonadism in male 06/29/2019  . Pes cavus 04/30/2018  . Chronic pain syndrome 02/18/2018  . Chronic headaches 01/21/2018  . Postoperative CSF leak 10/31/2017  . Moderate episode of recurrent major depressive disorder ([place]) 07/21/2017  . Obesity due to excess calories 08/07/2016  . MCI (mild cognitive impairment) with memory loss 08/06/2016  . Peripheral neuropathy 08/06/2016  . Anxiety 07/17/2016  . Insomnia 07/17/2016  . Eczema 01/22/2016  . Benign essential tremor 10/03/2015  . Glaucoma 06/27/2015  . B12 deficiency 06/27/2015  . Cataplexy and narcolepsy 06/26/2015  . Billowing mitral valve 06/26/2015  . History of prolonged Q-T interval on ECG 06/26/2015  . Herniation of nucleus pulposus 06/26/2015  . Low testosterone 06/26/2015  . Obesity (BMI 30.0-34.9) 06/26/2015  . CAD (coronary artery disease), native coronary artery 06/26/2015  . Fatty infiltration of liver 10/21/2014  . Psoriasis 07/22/2014  . Acid reflux 07/22/2014  . Type 2 diabetes, controlled, with neuropathy ([place]) 06/22/2014  . Hyperlipidemia associated with type 2 diabetes mellitus ([place]) 06/22/2014  . Essential (primary) hypertension 06/22/2014  . Failed back syndrome of lumbar spine 04/29/2014  . Chronic pain associated with significant psychosocial dysfunction 04/29/2014  . History of surgical procedure 04/29/2014  . Polypharmacy 09/09/2013  . Pain medication agreement signed 09/09/2013  . Other long term (current) drug  therapy 11/20/1998  . Encounter for long-term (current) use of other medications 11/20/1998    Conditions to be addressed/monitored: HTN, HLD and DMII  Care Plan : General Pharmacy (Adult)  Updates made by Vella Raring, RPH since 12/20/2020 12:00 AM    Problem: Disease Progression     Long-Range Goal: Disease Progression Prevented or Minimized   Start Date: 11/13/2020  Expected End Date: 02/11/2021  Recent Progress: On track  Priority: High  Note:   Current Barriers:  . Financial Barriers in complicated patient with multiple medical conditions including T2DM, HTN, peripheral neuropathy, depression, GERD, tremors  and chronic pain; patient has [employer] insurance . Frustration with current pill burden  Pharmacist Clinical Goal(s):  Marland Kitchen Over the next 90 days, patient will maintain control of blood sugar as evidenced by A1C >6.5% through collaboration with PharmD and provider.   Interventions: . 1:1 collaboration with Olin Hauser, DO regarding development and update of comprehensive plan of care as evidenced by provider attestation and co-signature . Inter-disciplinary care team collaboration (see longitudinal plan of care) . 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 Controlled; current treatment: o metformin 1000 mg twice daily o Ozempic 1 mg weekly on Fridays . Reports recent morning fasting readings ranging: 70s-98 o Denies s/s of low blood sugar . Have discussed role of dietary choices and exercise in blood sugar control  Hypertension . Perform chart review. Patient seen for Office Visit with PCP on 1/24. Provider advised patient to: "Increase Propranolol / switch dose from XL to IR. Previously Propranolol XL 49m daily per Neurology for  Tremors. Now will switch to Propranolol IR 458mTID for total daily mg in 24 hours = 12074mso increased dose can help tremors and maybe IR will have better tremor coverage and help BP control." . Patient currently taking: o Lisinopril 5 mg daily o Propranolol IR 22m33mD o HCTZ 25 mg daily . Reports recent home BP readings: o 2/11: 118/86, HR 65 o 2/7: 117/79, HR 78 o 2/6: 114/80, HR 63 o 2/1: 127/89, HR 82 . Reports switch of propranolol dose/to IR formulation has almost eliminated his tremors . Reports only has occasional mild dizziness if he gets up too quickly. Counsel to continue to take position changes slowly . Counsel patient on BP monitoring technique . Encourage patient to continue home BP monitoring, keep log of results, bring record to medical appointments and call for readings outside of established parameters any new dizziness or new symptoms . Will mail patient a home blood pressure log as requested  Patient Goals/Self-Care Activities . Over the next 90 days, patient will:  - take medications as prescribed - check glucose, document, and provide at future appointments - check blood pressure, document, and provide at future appointments  Follow Up Plan: Telephone follow up appointment with care management team member scheduled for: 5/16 at 1:15 pm     Follow Up:  Patient agrees to Care Plan and Follow-up.  ElisHarlow AsaarmD, BCACSan [phone]

## 2020-12-27 ENCOUNTER — Other Ambulatory Visit: Payer: Self-pay | Admitting: Family Medicine

## 2020-12-27 DIAGNOSIS — I10 Essential (primary) hypertension: Secondary | ICD-10-CM

## 2020-12-27 NOTE — Telephone Encounter (Signed)
   Notes to clinic:  Medication filled by a historical provider  Review for refill    Requested Prescriptions  Pending Prescriptions Disp Refills   hydrochlorothiazide (HYDRODIURIL) 25 MG tablet [Pharmacy Med Name: HYDROCHLOROTHIAZIDE 25 MG TAB] 30 tablet 11    Sig: TAKE 1 TABLET BY MOUTH EVERY DAY      Cardiovascular: Diuretics - Thiazide Passed - 12/27/2020  9:50 AM      Passed - Ca in normal range and within 360 days    Calcium  Date Value Ref Range Status  11/10/2020 9.8 8.6 - 10.3 mg/dL Final   Calcium, Total  Date Value Ref Range Status  08/20/2013 9.5 8.5 - 10.1 mg/dL Final          Passed - Cr in normal range and within 360 days    Creat  Date Value Ref Range Status  11/10/2020 0.83 0.70 - 1.25 mg/dL Final    Comment:    For patients >87 years of age, the reference limit for Creatinine is approximately 13% higher for people identified as African-American. .           Passed - K in normal range and within 360 days    Potassium  Date Value Ref Range Status  11/10/2020 3.9 3.5 - 5.3 mmol/L Final  08/20/2013 3.5 3.5 - 5.1 mmol/L Final          Passed - Na in normal range and within 360 days    Sodium  Date Value Ref Range Status  11/10/2020 139 135 - 146 mmol/L Final  11/12/2016 142 134 - 144 mmol/L Final  08/20/2013 140 136 - 145 mmol/L Final          Passed - Last BP in normal range    BP Readings from Last 1 Encounters:  11/27/20 138/81          Passed - Valid encounter within last 6 months    Recent Outpatient Visits           1 month ago Essential (primary) hypertension   [place], DO   1 month ago Hyperlipidemia associated with type 2 diabetes mellitus [facility])   [place], DO   5 months ago Annual physical exam   [facility] Olin Hauser, DO   11 months ago Type 2 diabetes, controlled, with neuropathy [facility])   [facility] Olin Hauser, DO   1 year ago Annual physical exam   [facility] Olin Hauser, DO       Future Appointments             In 1 month [place], [facility], [place]   In 6 months [facility], Devonne Doughty, [facility], [facility]

## 2020-12-30 ENCOUNTER — Other Ambulatory Visit: Payer: Self-pay | Admitting: Urology

## 2021-01-08 NOTE — Chronic Care Management (AMB) (Signed)
  Care Management   Note  01/08/2021 Name: Dale [name]. MRN: [id number] DOB: 1961/09/07  Dale [name]. is a 60 y.o. year old male who is a primary care patient of Olin Hauser, DO and is actively engaged with the care management team. I reached out to Dale [name]. by phone today to assist with re-scheduling a follow up visit with the Licensed Clinical Social Worker  Follow up plan: Patient declines further follow up and engagement by the care management team. Appropriate care team members and provider have been notified via electronic communication.   Noreene Larsson, [place], [place], [place] [address] Direct Dial: [phone] [email or web address]: [email or web address]

## 2021-01-08 NOTE — Telephone Encounter (Signed)
Pt states that he does not want to r/s a f/u with LCSW, states that he is currently seeing a psychiatrist

## 2021-01-11 ENCOUNTER — Other Ambulatory Visit: Payer: Self-pay | Admitting: Family Medicine

## 2021-01-11 DIAGNOSIS — I10 Essential (primary) hypertension: Secondary | ICD-10-CM

## 2021-01-29 ENCOUNTER — Other Ambulatory Visit: Payer: Self-pay | Admitting: Family Medicine

## 2021-01-29 DIAGNOSIS — E1140 Type 2 diabetes mellitus with diabetic neuropathy, unspecified: Secondary | ICD-10-CM

## 2021-02-05 ENCOUNTER — Ambulatory Visit: Payer: Medicare HMO | Admitting: Family Medicine

## 2021-02-06 ENCOUNTER — Encounter: Payer: Self-pay | Admitting: Family Medicine

## 2021-02-06 ENCOUNTER — Other Ambulatory Visit: Payer: Self-pay

## 2021-02-06 ENCOUNTER — Ambulatory Visit (INDEPENDENT_AMBULATORY_CARE_PROVIDER_SITE_OTHER): Payer: Medicare Other | Admitting: Family Medicine

## 2021-02-06 VITALS — BP 120/72 | HR 72 | Ht 72.0 in | Wt 214.0 lb

## 2021-02-06 DIAGNOSIS — E1140 Type 2 diabetes mellitus with diabetic neuropathy, unspecified: Secondary | ICD-10-CM

## 2021-02-06 DIAGNOSIS — Z111 Encounter for screening for respiratory tuberculosis: Secondary | ICD-10-CM

## 2021-02-06 DIAGNOSIS — I10 Essential (primary) hypertension: Secondary | ICD-10-CM | POA: Diagnosis not present

## 2021-02-06 DIAGNOSIS — Z789 Other specified health status: Secondary | ICD-10-CM | POA: Diagnosis not present

## 2021-02-06 DIAGNOSIS — R7611 Nonspecific reaction to tuberculin skin test without active tuberculosis: Secondary | ICD-10-CM

## 2021-02-06 NOTE — Patient Instructions (Addendum)
Thank you for coming to the office today.   Recent Labs    07/25/20 1152 11/10/20 1048  HGBA1C 5.5 6.3*   Today MMR measles mumps rubella lab titer for immunity  PPD TB Skin Test, return for nurse visit check PPD in 48 hours.  OPTIONAL FAST in 1 week for lab, do not need it for A1c but do need it next time.  Stay tuned for A1c result.  Keep on current track.  BP is okay right now as long as you are tolerating it, we can do some fine tuning adjustment if BP still low or symptoms when standing.  Let me know if when ready to see ENT specialist about abnormal smell / sinuses  DUE for FASTING BLOOD WORK (no food or drink after midnight before the lab appointment, only water or coffee without cream/sugar on the morning of)  SCHEDULE "Lab Only" visit in the morning at the clinic for lab draw in 1 week then again in 5 months  - Make sure Lab Only appointment is at about 1 week before your next appointment, so that results will be available  For Lab Results, once available within 2-3 days of blood draw, you can can log in to MyChart online to view your results and a brief explanation. Also, we can discuss results at next follow-up visit.   Please schedule a Follow-up Appointment to: Return in about 5 months (around 07/09/2021) for 2 days nurse visit PPD Skin reading, next week lab only, then 5 month lab only, then 1 wk Physical.  If you have any other questions or concerns, please feel free to call the office or send a message through [place]. You may also schedule an earlier appointment if necessary.  Additionally, you may be receiving a survey about your experience at our office within a few days to 1 week by e-mail or mail. We value your feedback.  [name], DO [place]

## 2021-02-06 NOTE — Progress Notes (Signed)
Subjective:    Patient ID: Dale [name]., male    DOB: 1961/08/29, 60 y.o.   MRN: [id number]  Dale [name]. is a 60 y.o. male presenting on 02/06/2021 for Hypertension, Diabetes, and Fall (Hit head. Stumbled and fell)   HPI   CHRONIC HTN: Now improved BP Followed by [facility] Cardiology Dr Nehemiah Massed Current Meds - Amlodipine 50m daily,  HCTZ 27mdaily, Propranolol 409mID Reports good compliance, took meds today. Tolerating well, w/o complaints. Denies CP, dyspnea, HA, edema, dizziness / lightheadedness  Home readings for BP - show Fluctuation of BP readings 90/75, 100/73 He notices some symptoms of lightheaded or slightly dizziness only with sudden standing, but not impacting him otherwise. Otherwise he feels good with current medications.  Recent history of Fall Admits stumbling issue lost balance, fall, was not related to BP  He has upcoming / Standing order back x-ray - Pain management he will get this done soon.  Chronic Sinusitis Foul Smell Still has sour smell in nose, previously treated for this with sinus treatment 10/2020 did not resolve the issue Consider ENT, he has seen them before. Not ready to return. Other smells are normal.  Type 2 DM Last done A1c 11/10/20 - 6.3, back on Ozempic CBG 100-120s avg now Back on medication  Tremors Propranolol 34m27mD - improved  Paperwork today for [employer] school Needs PPD test today, MMR   Depression screen PHQ [facility] 07/25/2020 01/19/2020 07/20/2019  Decreased Interest 2 1 0  Down, Depressed, Hopeless 1 1 0  PHQ - 2 Score 3 2 0  Altered sleeping 0 0 3  Tired, decreased energy _0 Change in appetite 0 0 0  Feeling bad or failure about yourself  0 0 0  Trouble concentrating 0 1 0  Moving slowly or fidgety/restless 0 0 0  Suicidal thoughts 0 0 0  PHQ-9 Score _1 Difficult doing work/chores Somewhat difficult Somewhat difficult Not difficult at all  Some recent data might be  hidden    Social History   Tobacco Use  . Smoking status: Former Smoker    Types: Cigarettes    Start date: 08/29/1979    Quit date: 11/04/2010    Years since quitting: 10.2  . Smokeless tobacco: Former UserSystems developerQuit date: 11/19/2010  . Tobacco comment: 1 to 1.5 packs per day  Vaping Use  . Vaping Use: Never used  Substance Use Topics  . Alcohol use: No    Alcohol/week: 0.0 standard drinks  . Drug use: No    Review of Systems Per HPI unless specifically indicated above     Objective:    BP 120/72 (BP Location: Left Arm, Patient Position: Sitting, Cuff Size: Normal)   Pulse 72   Ht 6' (1.829 m)   Wt 214 lb (97.1 kg)   SpO2 99%   BMI 29.02 kg/m   Wt Readings from Last 3 Encounters:  02/06/21 214 lb (97.1 kg)  11/27/20 220 lb (99.8 kg)  11/01/20 221 lb (100.2 kg)    Physical Exam Vitals and nursing note reviewed.  Constitutional:      General: He is not in acute distress.    Appearance: He is well-developed. He is not diaphoretic.     Comments: Well-appearing, comfortable, cooperative  HENT:     Head: Normocephalic and atraumatic.  Eyes:     General:        Right eye: No discharge.  Left eye: No discharge.     Conjunctiva/sclera: Conjunctivae normal.  Neck:     Thyroid: No thyromegaly.  Cardiovascular:     Rate and Rhythm: Normal rate and regular rhythm.     Heart sounds: Normal heart sounds. No murmur heard.   Pulmonary:     Effort: Pulmonary effort is normal. No respiratory distress.     Breath sounds: Normal breath sounds. No wheezing or rales.  Musculoskeletal:        General: Normal range of motion.     Cervical back: Normal range of motion and neck supple.  Lymphadenopathy:     Cervical: No cervical adenopathy.  Skin:    General: Skin is warm and dry.     Findings: No erythema or rash.  Neurological:     Mental Status: He is alert and oriented to person, place, and time.  Psychiatric:        Behavior: Behavior normal.     Comments: Well  groomed, good eye contact, normal speech and thoughts       Results for orders placed or performed in visit on 11/10/20  Lipid panel  Result Value Ref Range   Cholesterol 118 <200 mg/dL   HDL 37 (L) > OR = 40 mg/dL   Triglycerides 235 (H) <150 mg/dL   LDL Cholesterol (Calc) 52 mg/dL (calc)   Total CHOL/HDL Ratio 3.2 <5.0 (calc)   Non-HDL Cholesterol (Calc) 81 <130 mg/dL (calc)  COMPLETE METABOLIC PANEL WITH GFR  Result Value Ref Range   Glucose, Bld 127 (H) 65 - 99 mg/dL   BUN 16 7 - 25 mg/dL   Creat 0.83 0.70 - 1.25 mg/dL   GFR, Est Non African American 96 > OR = 60 mL/min/1.52m   GFR, Est African American 111 > OR = 60 mL/min/1.753m  BUN/Creatinine Ratio NOT APPLICABLE 6 - 22 (calc)   Sodium 139 135 - 146 mmol/L   Potassium 3.9 3.5 - 5.3 mmol/L   Chloride 102 98 - 110 mmol/L   CO2 30 20 - 32 mmol/L   Calcium 9.8 8.6 - 10.3 mg/dL   Total Protein 6.5 6.1 - 8.1 g/dL   Albumin 4.3 3.6 - 5.1 g/dL   Globulin 2.2 1.9 - 3.7 g/dL (calc)   AG Ratio 2.0 1.0 - 2.5 (calc)   Total Bilirubin 0.4 0.2 - 1.2 mg/dL   Alkaline phosphatase (APISO) 77 35 - 144 U/L   AST 20 10 - 35 U/L   ALT 17 9 - 46 U/L  Hemoglobin A1c  Result Value Ref Range   Hgb A1c MFr Bld 6.3 (H) <5.7 % of total Hgb   Mean Plasma Glucose 134 mg/dL   eAG (mmol/L) 7.4 mmol/L      Assessment & Plan:   Problem List Items Addressed This Visit    Type 2 diabetes, controlled, with neuropathy (HCC) - Primary   Relevant Medications   lisinopril (ZESTRIL) 10 MG tablet   Essential (primary) hypertension   Relevant Medications   lisinopril (ZESTRIL) 10 MG tablet   amLODipine (NORVASC) 10 MG tablet    Other Visit Diagnoses    History of measles, mumps, rubella (MMR) vaccination unknown       Relevant Orders   Measles/Mumps/Rubella Immunity   Encounter for PPD skin test reading       Relevant Orders   TB Skin Test (Completed)      #Diabetes Type 2 Controlled Prior A1c controlled 6.3 in 11/2020 Not due yet for  repeat A1c, 2  days early has not been 3 months Will encourage continue current regimen with back on Ozempic and he can return for lab A1c in 1 week.  #HTN Chronic BP, now controlled, has some low readings still Followed by Cardiology [facility] Dr Nehemiah Massed, has had amlodipine dose increased recently 5 to 26m, doing better BP, no more headaches or high BP readings. Now has temporary brief lightheaded or symptoms with sudden standing but not endorsing complications from low BP. Not having syncope or other complication right now. He feels comfortable with current medication regimen.  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 Has done well without complication. 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 If positive, or reactive, we will need to do Chest X-ray here, can order it while he is here after has the skin test read.  For form he will also need verification of MMR vaccine status - verbal reported, but no documentation. Check MMR Lab Titer today.  He has updated TDap, Hep B vaccines on chart already.  #Sinusitis Chronic problem If unresolved abnormal smell [facility]. He can notify u[place] also advised retraining scent with practicing strong smelling things.  No orders of the defined types were placed in this encounter.  Orders Placed This Encounter  Procedures  . Measles/Mumps/Rubella Immunity  . TB Skin Test    Order Specific Question:   Has patient ever tested positive?    Answer:   No      Follow up plan: Return in about 5 months (around 07/09/2021) for 2 days nurse visit PPD Skin reading, next week lab only, then 5 month lab only, then 1 wk Physical.  ANobie Putnam DO [facility] 02/06/2021, 11:13 AM

## 2021-02-07 LAB — MEASLES/MUMPS/RUBELLA IMMUNITY
Mumps IgG: 248 AU/mL
Rubella: 21.4 Index
Rubeola IgG: 252 AU/mL

## 2021-02-08 ENCOUNTER — Ambulatory Visit: Payer: Medicare Other

## 2021-02-08 ENCOUNTER — Other Ambulatory Visit: Payer: Self-pay

## 2021-02-08 ENCOUNTER — Ambulatory Visit
Admission: RE | Admit: 2021-02-08 | Discharge: 2021-02-08 | Disposition: A | Discharge status: Home / Self Care | Payer: Medicare Other | Source: Ambulatory Visit | Attending: Family Medicine | Admitting: Family Medicine

## 2021-02-08 DIAGNOSIS — R7611 Nonspecific reaction to tuberculin skin test without active tuberculosis: Secondary | ICD-10-CM

## 2021-02-08 NOTE — Addendum Note (Signed)
Addended by: Orinda Kenner on: 02/08/2021 11:27 AM   Modules accepted: Orders

## 2021-02-11 ENCOUNTER — Other Ambulatory Visit: Payer: Self-pay | Admitting: Family Medicine

## 2021-02-11 DIAGNOSIS — I10 Essential (primary) hypertension: Secondary | ICD-10-CM

## 2021-02-11 NOTE — Telephone Encounter (Signed)
Requested Prescriptions  Pending Prescriptions Disp Refills  . propranolol (INDERAL) 40 MG tablet [Pharmacy Med Name: PROPRANOLOL 40 MG TABLET] 270 tablet 0    Sig: TAKE 1 TABLET BY MOUTH THREE TIMES A DAY     Cardiovascular:  Beta Blockers Passed - 02/11/2021  9:22 AM      Passed - Last BP in normal range    BP Readings from Last 1 Encounters:  02/06/21 120/72         Passed - Last Heart Rate in normal range    Pulse Readings from Last 1 Encounters:  02/06/21 72         Passed - Valid encounter within last 6 months    Recent Outpatient Visits          5 days ago Type 2 diabetes, controlled, with neuropathy ([place])   [place], DO   2 months ago Essential (primary) hypertension   [place], DO   3 months ago Hyperlipidemia associated with type 2 diabetes mellitus [facility])   [facility] Olin Hauser, DO   6 months ago Annual physical exam   [facility] Olin Hauser, DO   1 year ago Type 2 diabetes, controlled, with neuropathy [facility])   [facility] [place], Devonne Doughty, DO      Future Appointments            In 5 months [place], Devonne Doughty, DO [facility], [facility]

## 2021-02-12 LAB — TB SKIN TEST
Induration: 10 mm
TB Skin Test: POSITIVE

## 2021-02-13 ENCOUNTER — Other Ambulatory Visit: Payer: Self-pay

## 2021-02-13 ENCOUNTER — Other Ambulatory Visit: Payer: Medicare Other

## 2021-02-13 DIAGNOSIS — E1140 Type 2 diabetes mellitus with diabetic neuropathy, unspecified: Secondary | ICD-10-CM

## 2021-02-14 LAB — HEMOGLOBIN A1C
Hgb A1c MFr Bld: 5.7 % of total Hgb — ABNORMAL HIGH (ref <5.7)
Mean Plasma Glucose: 117 mg/dL
eAG (mmol/L): 6.5 mmol/L

## 2021-02-21 ENCOUNTER — Ambulatory Visit: Payer: Medicare Other | Admitting: Family Medicine

## 2021-03-01 ENCOUNTER — Other Ambulatory Visit: Payer: Self-pay | Admitting: Urology

## 2021-03-02 ENCOUNTER — Telehealth: Payer: Self-pay | Admitting: Urology

## 2021-03-02 DIAGNOSIS — E291 Testicular hypofunction: Secondary | ICD-10-CM

## 2021-03-02 NOTE — Telephone Encounter (Signed)
Notified patient as instructed, patient pleased. Discussed follow-up appointments, patient agrees  

## 2021-03-02 NOTE — Telephone Encounter (Signed)
Received a testosterone refill request on this patient.  He was due for testosterone and hematocrit in February which was not done.  Please schedule lab visit for testosterone/hematocrit and a follow-up visit August 2022 for PSA/testosterone/hematocrit

## 2021-03-02 NOTE — Addendum Note (Signed)
Addended by: Chrystie Nose on: 03/02/2021 12:58 PM   Modules accepted: Orders

## 2021-03-06 ENCOUNTER — Other Ambulatory Visit: Payer: Medicare Other

## 2021-03-06 ENCOUNTER — Other Ambulatory Visit: Payer: Self-pay

## 2021-03-06 DIAGNOSIS — E291 Testicular hypofunction: Secondary | ICD-10-CM

## 2021-03-07 ENCOUNTER — Encounter: Payer: Self-pay | Admitting: *Deleted

## 2021-03-07 LAB — TESTOSTERONE: Testosterone: 266 ng/dL (ref 264–916)

## 2021-03-07 LAB — HEMATOCRIT: Hematocrit: 48.7 % (ref 37.5–51.0)

## 2021-03-10 ENCOUNTER — Other Ambulatory Visit: Payer: Self-pay | Admitting: Family Medicine

## 2021-03-10 DIAGNOSIS — E1140 Type 2 diabetes mellitus with diabetic neuropathy, unspecified: Secondary | ICD-10-CM

## 2021-03-10 NOTE — Telephone Encounter (Signed)
Requested Prescriptions  Pending Prescriptions Disp Refills  . metFORMIN (GLUCOPHAGE) 1000 MG tablet [Pharmacy Med Name: metFORMIN HCl 1000 MG Oral Tablet] 180 tablet 1    Sig: TAKE 1 TABLET BY MOUTH  TWICE DAILY WITH A MEAL     Endocrinology:  Diabetes - Biguanides Passed - 03/10/2021  4:19 PM      Passed - Cr in normal range and within 360 days    Creat  Date Value Ref Range Status  11/10/2020 0.83 0.70 - 1.25 mg/dL Final    Comment:    For patients >1 years of age, the reference limit for Creatinine is approximately 13% higher for people identified as African-American. .          Passed - HBA1C is between 0 and 7.9 and within 180 days    Hgb A1c MFr Bld  Date Value Ref Range Status  02/13/2021 5.7 (H) <5.7 % of total Hgb Final    Comment:    For someone without known diabetes, a hemoglobin  A1c value between 5.7% and 6.4% is consistent with prediabetes and should be confirmed with a  follow-up test. . For someone with known diabetes, a value <7% indicates that their diabetes is well controlled. A1c targets should be individualized based on duration of diabetes, age, comorbid conditions, and other considerations. . This assay result is consistent with an increased risk of diabetes. . Currently, no consensus exists regarding use of hemoglobin A1c for diagnosis of diabetes for children. .          Passed - eGFR in normal range and within 360 days    GFR, Est African American  Date Value Ref Range Status  11/10/2020 111 > OR = 60 mL/min/1.15m Final   GFR, Est Non African American  Date Value Ref Range Status  11/10/2020 96 > OR = 60 mL/min/1.757mFinal         Passed - Valid encounter within last 6 months    Recent Outpatient Visits          1 month ago [facility]  [place]   3 months ago Essential (primary) hypertension   [place]   4 months ago Hyperlipidemia associated with type 2 diabetes mellitus [facility]  [place]   7 months ago Annual physical exam   [facility]   1 year [facility], [facility]  [place]      Future Appointments            In 3 months Stoioff, ScRonda FairlyMD [place] In 4 months [place] RangerAlDevonne DoughtyD[facility]

## 2021-03-14 ENCOUNTER — Other Ambulatory Visit: Payer: Self-pay

## 2021-03-14 DIAGNOSIS — I10 Essential (primary) hypertension: Secondary | ICD-10-CM

## 2021-03-14 MED ORDER — HYDROCHLOROTHIAZIDE 25 MG PO TABS: 1.0000 | ORAL_TABLET | Freq: Every day | ORAL | 3 refills | Status: DC

## 2021-03-15 ENCOUNTER — Other Ambulatory Visit: Payer: Self-pay | Admitting: Family Medicine

## 2021-03-19 ENCOUNTER — Telehealth: Payer: Self-pay

## 2021-03-19 ENCOUNTER — Telehealth: Payer: Self-pay | Admitting: Pharmacist

## 2021-03-19 NOTE — Telephone Encounter (Signed)
  Chronic Care Management   Outreach Note  03/19/2021 Name: Dale [name]. MRN: [id number] DOB: [birth year]  Referred by: Olin Hauser, DO Reason for referral : No chief complaint on file.   Was unable to reach patient via telephone today and have left HIPAA compliant voicemail asking patient to return my call.    Follow Up Plan: Will collaborate with Care Guide to outreach to schedule follow up with me  Harlow Asa, PharmD, [place] Management [phone]

## 2021-03-23 ENCOUNTER — Telehealth: Payer: Self-pay

## 2021-03-23 NOTE — Chronic Care Management (AMB) (Signed)
  Care Management   Note  03/23/2021 Name: Dale [name]. MRN: [id number] DOB: 1960-12-09  Dale [name]. is a 60 y.o. year old male who is a primary care patient of Olin Hauser, DO and is actively engaged with the care management team. I reached out to Dale [name]. by phone today to assist with re-scheduling a follow up visit with the Licensed Clinical Social Worker  Follow up plan: Unsuccessful telephone outreach attempt made. A HIPAA compliant phone message was left for the patient providing contact information and requesting a return call.  The care management team will reach out to the patient again over the next 7 days.  If patient returns call to provider office, please advise to call [facility]  at [place], [place], [place], [place] [address] Direct Dial: [phone] [email or web address] Website: [email or web address]

## 2021-03-25 MED ORDER — TAMSULOSIN HCL 0.4 MG PO CAPS: 0.4000 mg | ORAL_CAPSULE | Freq: Every day | ORAL | 2 refills | Status: DC

## 2021-03-25 MED ORDER — TESTOSTERONE CYPIONATE 200 MG/ML IM SOLN: 200.0000 mg | INTRAMUSCULAR | 3 refills | Status: DC

## 2021-03-27 ENCOUNTER — Other Ambulatory Visit: Payer: Self-pay | Admitting: Urology

## 2021-03-29 NOTE — Chronic Care Management (AMB) (Signed)
  Care Management   Note  03/29/2021 Name: Delmas Faucett. MRN: [id number] DOB: 1960-12-17  [name] is a 60 y.o. year old male who is a primary care patient of Olin Hauser, DO and is actively engaged with the care management team. I reached out to [name] by phone today to assist with re-scheduling a follow up visit with the Pharmacist  Follow up plan: Telephone appointment with care management team member scheduled for:04/06/2021  Noreene Larsson, [place], [place],  [address] Direct Dial: [phone] [email or web address]: [email or web address]

## 2021-03-29 NOTE — Telephone Encounter (Signed)
Patient has been rescheduled.

## 2021-04-06 ENCOUNTER — Ambulatory Visit (INDEPENDENT_AMBULATORY_CARE_PROVIDER_SITE_OTHER): Payer: Medicare Other | Admitting: Pharmacist

## 2021-04-06 DIAGNOSIS — E1140 Type 2 diabetes mellitus with diabetic neuropathy, unspecified: Secondary | ICD-10-CM | POA: Diagnosis not present

## 2021-04-06 DIAGNOSIS — I10 Essential (primary) hypertension: Secondary | ICD-10-CM | POA: Diagnosis not present

## 2021-04-06 NOTE — Patient Instructions (Signed)
Visit Information  PATIENT GOALS: Goals Addressed            This Visit's Progress   . Pharmacy Goals       Please check your blood pressure, keep record of results (I am mailing you a log) and bring this record with you to medical appointments.  Our goal bad cholesterol, or LDL, is less than 70 . This is why it is important to continue taking your atorvastatin  Feel free to call me with any questions or concerns. I look forward to our next call!  Harlow Asa, PharmD, [place], CPP Clinical Pharmacist [facility] [phone]        The patient verbalized understanding of instructions, educational materials, and care plan provided today and declined offer to receive copy of patient instructions, educational materials, and care plan.   Telephone follow up appointment with care management team member scheduled for: 6/8 at 10:30 am

## 2021-04-06 NOTE — Chronic Care Management (AMB) (Signed)
Chronic Care Management Pharmacy Note  04/06/2021 Name:  Antoinne Spadaccini. MRN:  [id number] DOB:  Jan 17, 1961   Subjective: Dale [name]. is an 60 y.o. year old male who is a primary patient of Olin Hauser, DO.  The CCM team was consulted for assistance with disease management and care coordination needs.    Engaged with patient by telephone for follow up visit in response to provider referral for pharmacy case management and/or care coordination services.   Consent to Services:  The patient was given information about Chronic Care Management services, agreed to services, and gave verbal consent prior to initiation of services.  Please see initial visit note for detailed documentation.   Patient Care Team: Olin Hauser, DO as PCP - General (Family Medicine) [name] Virl Diamond, RPH-CPP as Pharmacist  Recent office visits: Office Visit with PCP on 4/5  Recent consult visits: Office Visit with [facility] Pain Management on 5/25. Provider advised patient to: Office Visit with Neurologist Lavena Bullion at [facility] on 5/5 Office Visit with Neurologist Dr. Manuella Ghazi at [facility] on 4/28.  Office Visit with [facility] Gastroenterologist on 4/28.  Office Visit with [facility] Cardiology on 4/20 Office Visit with [facility] Cardiology on 2/23 for Initial Visit  Hospital visits: None in previous 6 months  Objective:  Lab Results  Component Value Date   CREATININE 0.83 11/10/2020   CREATININE 1.07 07/13/2019   CREATININE 0.80 07/08/2018    Lab Results  Component Value Date   HGBA1C 5.7 (H) 02/13/2021   Last diabetic Eye exam:  Lab Results  Component Value Date/Time   HMDIABEYEEXA No Retinopathy 04/27/2020 12:00 AM    Last diabetic Foot exam: No results found for: HMDIABFOOTEX      Component Value Date/Time   CHOL 118 11/10/2020 1048   CHOL 104 06/26/2015 1126   TRIG 235 (H) 11/10/2020 1048   HDL 37 (L) 11/10/2020  1048   HDL 30 (L) 06/26/2015 1126   CHOLHDL 3.2 11/10/2020 1048   VLDL 52 (H) 07/18/2016 1041   LDLCALC 52 11/10/2020 1048    Hepatic Function Latest Ref Rng & Units 11/10/2020 07/13/2019 07/08/2018  Total Protein 6.1 - 8.1 g/dL 6.5 7.3 7.3  Albumin 3.5 - 5.0 g/dL - - -  AST 10 - 35 U/L _0 ALT 9 - 46 U/L _1 Alk Phosphatase 38 - 126 U/L - - -  Total Bilirubin 0.2 - 1.2 mg/dL 0.4 0.5 0.4  Bilirubin, Direct 0.1 - 0.5 mg/dL - - -    Social History   Tobacco Use  Smoking Status Former Smoker  . Types: Cigarettes  . Start date: 08/29/1979  . Quit date: 11/04/2010  . Years since quitting: 10.4  Smokeless Tobacco Former Systems developer  . Quit date: 11/19/2010  Tobacco Comment   1 to 1.5 packs per day   BP Readings from Last 3 Encounters:  02/06/21 120/72  11/27/20 138/81  11/01/20 132/75   Pulse Readings from Last 3 Encounters:  02/06/21 72  11/27/20 73  11/01/20 68   Wt Readings from Last 3 Encounters:  02/06/21 214 lb (97.1 kg)  11/27/20 220 lb (99.8 kg)  11/01/20 221 lb (100.2 kg)    Assessment: Review of patient past medical history, allergies, medications, health status, including review of consultants reports, laboratory and other test data, was performed as part of comprehensive evaluation and provision of chronic care management services.   SDOH:  (Social  Determinants of Health) assessments and interventions performed: none   CCM Care Plan  Allergies  Allergen Reactions  . Benzoin Other (See Comments)    blisters  . Butrans [Buprenorphine]     Severe Burn pain patch, blisters  . Doxycycline Hyclate Nausea Only  . Morphine Other (See Comments)  . Penicillins Other (See Comments)  . Tape Rash    Medications Reviewed Today    Reviewed by Vella Raring, RPH-CPP (Pharmacist) on 04/06/21 at 1213  Med List Status: <None>  Medication Order Taking? Sig Documenting Provider Last Dose Status Informant  acetaminophen (TYLENOL) 500 MG tablet [id number] Yes Take  500 mg by mouth daily as needed. [provider] Taking Active   Alcohol Swabs (B-D SINGLE USE SWABS REGULAR) PADS [id number]  Check blood sugar 2 times daily. Karamalegos, Devonne Doughty, DO  Active   ALPRAZolam Duanne Moron) 1 MG tablet [id number] Yes Take 1 mg by mouth 3 (three) times daily as needed. Sherri Rad, MD Taking Active   amLODipine (NORVASC) 10 MG tablet [id number] No Take 1 tablet (10 mg total) by mouth daily.  Patient not taking: Reported on 04/06/2021   Olin Hauser, DO Not Taking Active   ARIPiprazole (ABILIFY) 2 MG tablet [id number] Yes Take 2 mg by mouth daily. [provider] Taking Active   aspirin EC 81 MG tablet [id number] Yes Take 81 mg by mouth daily. [provider] Taking Active   atorvastatin (LIPITOR) 10 MG tablet [id number] Yes TAKE 1 TABLET BY MOUTH DAILY AT 2 PM Karamalegos, Devonne Doughty, DO Taking Active   b complex vitamins capsule [id number] Yes Take by mouth. [provider] Taking Active   Blood Glucose Calibration (TRUE METRIX LEVEL 1) Low SOLN [id number]  Check blood sugar 2 times daily Karamalegos, Devonne Doughty, DO  Active   Blood Glucose Monitoring Suppl (TRUE METRIX AIR GLUCOSE METER) w/Device KIT [id number]  Check blood sugar 2 times daily Olin Hauser, DO  Active   buPROPion (WELLBUTRIN XL) 300 MG 24 hr tablet [id number] Yes Take 1 tablet (300 mg total) by mouth daily. Olin Hauser, DO Taking Active   Cholecalciferol 25 MCG (1000 UT) tablet [id number] Yes Take 1,000 Units by mouth.  [provider] Taking Active   CINNAMON PO [id number] Yes Take by mouth. [provider] Taking Active   Coenzyme Q10 100 MG TABS [id number] Yes Take 100 mg by mouth daily. [provider] Taking Active   desoximetasone (TOPICORT) 0.25 % cream [id number] Yes Apply 1 application topically 2 (two) times daily as needed. Olin Hauser, DO Taking Active   diclofenac sodium (VOLTAREN) 1 % GEL  [id number] Yes Apply topically. [provider] Taking Active   dicyclomine (BENTYL) 20 MG tablet [id number] Yes TAKE 1 TABLET (20 MG TOTAL) BY MOUTH EVERY 6 (SIX) HOURS [provider] Taking Active            Med Note [name],  A   Mon Jun 12, 2020  9:15 AM) As needed  DULoxetine (CYMBALTA) 30 MG capsule [id number] No Take 1 capsule (30 mg total) by mouth daily. Take with existing 49m dose = total daily dose 926m Patient not taking: Reported on 04/06/2021   KaOlin HauserDO Not Taking Active   DULoxetine (CYMBALTA) 60 MG capsule [id number] Take 1 capsule (60 mg total) by mouth daily. Take with existing 3077mose = total daily dose 64m15matient not taking: Reported on 04/06/2021   KaraOlin Hauser Not Taking  Active   fluticasone (FLONASE) 50 MCG/ACT nasal spray [id number] No Place 2 sprays into both nostrils daily.  Patient not taking: Reported on 04/06/2021   [provider] Not Taking Active   gabapentin (NEURONTIN) 300 MG capsule [id number] Yes Take 3 capsules by mouth 3 (three) times daily. [provider] Taking Active            Med Note [name],  A   Wed Nov 08, 2020  4:56 PM) Reports taking 900 mg in the morning and at noon, and take 1200 mg (4 capsules) nightly as directed by [facility]  hydrochlorothiazide (HYDRODIURIL) 25 MG tablet [id number] Yes Take 1 tablet (25 mg total) by mouth daily. Olin Hauser, DO Taking Active   lisinopril (ZESTRIL) 10 MG tablet [id number] Yes Take 1 tablet by mouth daily. [provider] Taking Active   meloxicam (MOBIC) 7.5 MG tablet [id number] No Take 7.5 mg by mouth 2 (two) times daily.   Patient not taking: Reported on 04/06/2021   [provider] Not Taking Active   metFORMIN (GLUCOPHAGE) 1000 MG tablet [id number] Yes TAKE 1 TABLET BY MOUTH  TWICE DAILY WITH A MEAL Karamalegos, Devonne Doughty, DO Taking Active   Multiple Vitamin (MULTIVITAMIN)  tablet [id number] Yes Take 1 tablet by mouth daily. [provider] Taking Active   NEEDLE, DISP, 18 G (B-D DISP NEEDLE TW 18GX1") 18G X 1" MISC [id number]  For use to draw up medication into the syringe Stoioff, Scott C, MD  Active   NEEDLE, DISP, 21 G (BD SAFETYGLIDE SHIELDED NEEDLE) 21G X 1-1/2" [place] [id number]  For use of administering medication in the IM location Stoioff, Ronda Fairly, MD  Active   Omega-3 Fatty Acids (FISH OIL) 1200 MG CAPS [id number] Yes Take 1 capsule (1,200 mg total) by mouth daily.  Patient taking differently: Take 1,200 mg by mouth 2 (two) times daily.   Olin Hauser, DO Taking Active   OZEMPIC, 1 MG/DOSE, 4 MG/3ML Bonney Aid [id number] Yes INJECT 1 MG INTO THE SKIN ONCE A WEEK Karamalegos, Devonne Doughty, DO Taking Active   pantoprazole (PROTONIX) 40 MG tablet [id number] Yes Take 40 mg by mouth in the morning. [provider] Taking Active   polyethylene glycol (MIRALAX / GLYCOLAX) 17 g packet [id number] Yes Take 17 g by mouth daily as needed. [provider] Taking Active   propranolol (INDERAL) 40 MG tablet [id number]  TAKE 1 TABLET BY MOUTH THREE TIMES A DAY Karamalegos, Alexander Lenna Sciara, DO  Active   SODIUM FLUORIDE 5000 SENSITIVE 1.1-5 % GEL [id number]  Take by mouth. [provider]  Active   Syringe, Disposable, 3 ML MISC [id number]  Use syringes as directed by physician Abbie Sons, MD  Active   tamsulosin [facility]) 0.4 MG CAPS capsule [id number] Yes Take 1 capsule (0.4 mg total) by mouth daily. Abbie Sons, MD Taking Active   testosterone cypionate (DEPOTESTOSTERONE CYPIONATE) 200 MG/ML injection [id number] Yes Inject 1 mL (200 mg total) into the muscle every 14 (fourteen) days. Abbie Sons, MD Taking Active   tiZANidine (ZANAFLEX) 4 MG tablet [id number] Yes 1 tablet twice daily, May take an additional 1.5 tablet at bedtime [provider] Taking Active   traMADol (ULTRAM) 50 MG tablet [id number] Yes Take 50 mg by mouth every  6 (six) hours as needed. [provider] Taking Active   TRUE METRIX BLOOD GLUCOSE TEST test strip [id number]  Check blood sugar 2 times a day Olin Hauser, DO  Active  TRUEplus Lancets 33G MISC [id number]  Check blood sugar 2 times a day Olin Hauser, DO  Active           Patient Active Problem List   Diagnosis Date Noted  . Erectile dysfunction due to arterial insufficiency 06/29/2019  . Hypogonadism in male 06/29/2019  . Pes cavus 04/30/2018  . Chronic pain syndrome 02/18/2018  . Chronic headaches 01/21/2018  . Postoperative CSF leak 10/31/2017  . Moderate episode of recurrent major depressive disorder ([place]) 07/21/2017  . Obesity due to excess calories 08/07/2016  . MCI (mild cognitive impairment) with memory loss 08/06/2016  . Peripheral neuropathy 08/06/2016  . Anxiety 07/17/2016  . Insomnia 07/17/2016  . Eczema 01/22/2016  . Benign essential tremor 10/03/2015  . Glaucoma 06/27/2015  . B12 deficiency 06/27/2015  . Cataplexy and narcolepsy 06/26/2015  . Billowing mitral valve 06/26/2015  . History of prolonged Q-T interval on ECG 06/26/2015  . Herniation of nucleus pulposus 06/26/2015  . Low testosterone 06/26/2015  . Obesity (BMI 30.0-34.9) 06/26/2015  . CAD (coronary artery disease), native coronary artery 06/26/2015  . Fatty infiltration of liver 10/21/2014  . Psoriasis 07/22/2014  . Acid reflux 07/22/2014  . Type 2 diabetes, controlled, with neuropathy ([place]) 06/22/2014  . Hyperlipidemia associated with type 2 diabetes mellitus ([place]) 06/22/2014  . Essential (primary) hypertension 06/22/2014  . Failed back syndrome of lumbar spine 04/29/2014  . Chronic pain associated with significant psychosocial dysfunction 04/29/2014  . History of surgical procedure 04/29/2014  . Polypharmacy 09/09/2013  . Pain medication agreement signed 09/09/2013  . Other long term (current) drug therapy 11/20/1998  . Encounter for long-term (current) use of  other medications 11/20/1998    Immunization History  Administered Date(s) Administered  . Hep A / Hep B 10/21/2014, 01/04/2015, 06/21/2015  . Influenza,inj,Quad PF,6+ Mos 06/26/2018, 07/13/2019  . Influenza-Unspecified 06/28/2018, 08/22/2020  . Moderna Sars-Covid-2 Vaccination 01/25/2020, 02/04/2020  . PPD Test 02/06/2021  . Pneumococcal Polysaccharide-23 07/18/2016  . Tdap 07/19/2015, 06/02/2019    Conditions to be addressed/monitored: HTN, HLD and DMII  Care Plan : General Pharmacy (Adult)  Updates made by Vella Raring, RPH-CPP since 04/06/2021 12:00 AM    Problem: Disease Progression     Long-Range Goal: Disease Progression Prevented or Minimized   Start Date: 11/13/2020  Expected End Date: 02/11/2021  This Visit's Progress: On track  Recent Progress: On track  Priority: High  Note:   Current Barriers:  . Financial Barriers in complicated patient with multiple medical conditions including T2DM, HTN, peripheral neuropathy, depression, GERD, tremors and chronic pain; patient has [place] insurance . Frustration with current pill burden  Pharmacist Clinical Goal(s):  Marland Kitchen Over the next 90 days, patient will maintain control of blood sugar as evidenced by A1C >6.5% through collaboration with PharmD and provider.   Interventions: . 1:1 collaboration with Olin Hauser, DO regarding development and update of comprehensive plan of care as evidenced by provider attestation and co-signature . Inter-disciplinary care team collaboration (see longitudinal plan of care) . Perform chart review. o Patient seen for Office Visit with [facility] Cardiology on 2/23 for Initial Visit. Provider advised patient to increase lisinopril dose from 5 mg to 10 mg daily o Office Visit with PCP on 4/5 o Office Visit with [facility] Cardiology on 4/20 for follow up o Office Visit with [facility] Gastroenterologist on 4/28.  - Provider recommended  daily Miralax for chronic constipation o Office Visit with Neurologist Dr. Manuella Ghazi at [facility] on 4/28. Provider advised: - Repeat MRI Brain with and without contrast - Physical Therapy for balance o Office Visit with Neurologist Lavena Bullion at [facility] on 5/5 - Provider recommended changing propranolol to 62m twice daily (8am and 5pm) to help with depression [facility]. Provider advised patient to: - Continue tramadol, gabapentin, and tizanidine today with no changes - Stop meloxicam in order to confirm whether the medication is helping, but to restart if pain increases . 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 Comprehensive medication review performed; medication list updated in electronic medical record o Reports stopped taking meloxicam as discussed with Pain Management provider on 5/25. States also discussed with provider stopping duloxetine to decrease pill burden and has also been off of duloxetine since 5/25. Denies any difference, increase in pain or side effect since stopping meloxicam and duloxetine. Reports will follow up with psychiatry team regarding discontinuation of duloxetine at next appointment on 6/10. o Reports started Miralax as directed by Gastroenterologist and currently taking as needed for constipation . Patient denies having been contacted by Physical Therapy for balance exercises as referred by Dr. SKendrick RanchProvide patient with phone number for [facility]  Type 2 Diabetes: . Controlled; current treatment: o metformin 1000 mg twice daily o Ozempic 1 mg weekly on Fridays . Reports recent morning fasting readings ranging: 90-111 o Denies s/s of low blood sugar . Have discussed role of dietary choices and exercise in blood sugar control . Reports has only 1 dose of Ozempic remaining and unable to afford refill as has  entered the health plan coverage gap for the year  Medication Assistance: . Patient reports now in health plan coverage gap for calendar year and unable to afford Ozempic . Note patient denied for [employer]ast[employer] Chemicalpatient assistance program for 2021 calendar year due to income limit . Review with patient NEast[employer]patient assistance program income requirement for 2022 calendar year.  o Patient requests follow up next week after he has had a chance to review current household income  Hypertension: . Patient currently taking: o Lisinopril 10 mg daily o Propranolol IR 438mBID (~8am and 5pm)  - Reports decreased propranolol dose as recommended by Du[place] Rocknd still feels tremors are well-controlled o HCTZ 25 mg daily . Reports recent home BP readings:  6/3: 117/85, HR 84  6/2: 104/73, HR 66  6/1: 111/88, HR 104; rechecked, 100/75, HR 88  5/31: 100/74, HR 108 . Reports has occasional dizziness if he gets up too quickly. Counsel to continue to take position changes slowly . Encourage patient to continue home BP monitoring, keep log of results, bring record to medical appointments and call providers for readings outside of established parameters, dizziness or new symptoms . [facility] for medication reconciliation. Note amlodipine 10 mg daily is listed on medication list from latest visit with Cardiologist on 4/20 (and reflected on local medication list). However, patient denies currently taking amlodipine and per review of dispensing history in chart no record of amlodipine refill in the past 6 months.  o Speak with KeVida RollerCMA with Dr. KoNehemiah MassedProvide patient's latest reported home BP readings. KeVida Rollertates that she will note in their system that patient not currently taking amlodipine and will send a message to Dr. KoNehemiah Massedo let him know   Patient Goals/Self-Care Activities .  Over the next 90 days, patient will:  - take medications as prescribed -  check glucose, document, and provide at future appointments - check blood pressure, document, and provide at future appointments  Follow Up Plan: Telephone follow up appointment with care management team member scheduled for: 6/8 at 10:30 am     Patient's preferred pharmacy is:  [id number] [place] [address]Phone: [phone]: [phone] [facility]  [address] [place] [address]Phone: [phone]: [phone]  Follow Up:  Patient agrees to Care Plan and Follow-up.  EHarlow Asa PharmD, BPara March CPP Clinical Pharmacist [phone]

## 2021-04-11 ENCOUNTER — Telehealth: Payer: Self-pay

## 2021-04-11 ENCOUNTER — Telehealth: Payer: Self-pay | Admitting: Pharmacist

## 2021-04-11 NOTE — Telephone Encounter (Signed)
  Chronic Care Management   Outreach Note  04/11/2021 Name: Draden Cottingham. MRN: [id number] DOB: [birth year]  Referred by: Olin Hauser, DO Reason for referral : No chief complaint on file.   Was unable to reach patient via telephone today and have left HIPAA compliant voicemail asking patient to return my call.    Follow Up Plan: Will collaborate with Care Guide to outreach to schedule follow up with me  Harlow Asa, PharmD, [place] [facility] [phone]

## 2021-04-24 ENCOUNTER — Other Ambulatory Visit: Payer: Self-pay | Admitting: Family Medicine

## 2021-04-24 MED ORDER — "BD SAFETYGLIDE SHIELDED NEEDLE 21G X 1-1/2"" MISC": 0 refills | Status: DC

## 2021-04-24 MED ORDER — "BD DISP NEEDLE TW 18G X 1"" MISC": 0 refills | Status: DC

## 2021-04-26 ENCOUNTER — Telehealth: Payer: Self-pay

## 2021-04-26 NOTE — Chronic Care Management (AMB) (Signed)
  Care Management   Note  04/26/2021 Name: Dale [name]. MRN: [id number] DOB: 10-24-1961  Dale [name]. is a 59 y.o. year old male who is a primary care patient of Olin Hauser, DO and is actively engaged with the care management team. I reached out to Dale [name]. by phone today to assist with re-scheduling a follow up visit with the Pharmacist  Follow up plan: Unsuccessful telephone outreach attempt made. A HIPAA compliant phone message was left for the patient providing contact information and requesting a return call.  The care management team will reach out to the patient again over the next 7 days.  If patient returns call to provider office, please advise to call [place]  at [place], [address] [place], [place], [place] [address] Direct Dial: [phone] [email or web address] Website: [email or web address]

## 2021-05-03 NOTE — Chronic Care Management (AMB) (Signed)
  Care Management   Note  05/03/2021 Name: Dale [name]. MRN: [id number] DOB: 06-Dec-1960  [name] is a 60 y.o. year old male who is a primary care patient of Olin Hauser, DO and is actively engaged with the care management team. I reached out to [name] by phone today to assist with re-scheduling a follow up visit with the Pharmacist  Follow up plan: Unsuccessful telephone outreach attempt made. A HIPAA compliant phone message was left for the patient providing contact information and requesting a return call.  The care management team will reach out to the patient again over the next 7 days.  If patient returns call to provider office, please advise to call [place]  at [place], [place], [place], [place] [address] Direct Dial: [phone] [email or web address] Website: [email or web address]

## 2021-05-09 ENCOUNTER — Other Ambulatory Visit: Payer: Self-pay | Admitting: Neurology

## 2021-05-09 DIAGNOSIS — R413 Other amnesia: Secondary | ICD-10-CM

## 2021-05-11 NOTE — Chronic Care Management (AMB) (Signed)
  Care Management   Note  05/11/2021 Name: Dale [name]. MRN: [id number] DOB: Mar 21, 1961  Dale [name]. is a 60 y.o. year old male who is a primary care patient of Olin Hauser, DO and is actively engaged with the care management team. I reached out to Dale [name]. by phone today to assist with re-scheduling a follow up visit with the Pharmacist  Follow up plan: Unable to make contact on outreach attempts x 3. PCP Olin Hauser, DO notified via routed documentation in medical record.   Noreene Larsson, [place], [place], [place] [address] Direct Dial: [phone] [email or web address] Website: [email or web address]

## 2021-05-11 NOTE — Telephone Encounter (Signed)
3rd unsuccessful outreach  

## 2021-05-14 ENCOUNTER — Ambulatory Visit (INDEPENDENT_AMBULATORY_CARE_PROVIDER_SITE_OTHER): Payer: Medicare Other | Admitting: Pharmacist

## 2021-05-14 DIAGNOSIS — I10 Essential (primary) hypertension: Secondary | ICD-10-CM

## 2021-05-14 DIAGNOSIS — E1140 Type 2 diabetes mellitus with diabetic neuropathy, unspecified: Secondary | ICD-10-CM

## 2021-05-14 NOTE — Patient Instructions (Signed)
Visit Information  PATIENT GOALS:  Goals Addressed             This Visit's Progress    Pharmacy Goals       Our goal A1c is less than 7%. This corresponds with fasting sugars less than 130 and 2 hour after meal sugars less than 180. Please check your blood sugar and keep a log of the results  Please check your blood pressure, keep record of results and bring this record with you to medical appointments.  Our goal bad cholesterol, or LDL, is less than 70 . This is why it is important to continue taking your atorvastatin  Feel free to call me with any questions or concerns. I look forward to our next call!   Harlow Asa, PharmD, Para March, CPP Clinical Pharmacist [facility] [phone]          The patient verbalized understanding of instructions, educational materials, and care plan provided today and declined offer to receive copy of patient instructions, educational materials, and care plan.   Telephone follow up appointment with care management team member scheduled for: 9/2 at 10:30 am

## 2021-05-14 NOTE — Chronic Care Management (AMB) (Signed)
Chronic Care Management Pharmacy Note  05/14/2021 Name:  Dale [name]. MRN:  [id number] DOB:  December 02, 1960  Subjective: Dale [name]. is an 60 y.o. year old male who is a primary patient of Olin Hauser, DO.  The CCM team was consulted for assistance with disease management and care coordination needs.    Note CCM team recently had difficulty with reaching patient by phone. Patient reports [place].  Engaged with patient by telephone for follow up visit in response to provider referral for pharmacy case management and/or care coordination services.   Consent to Services:  The patient was given information about Chronic Care Management services, agreed to services, and gave verbal consent prior to initiation of services.  Please see initial visit note for detailed documentation.   Patient Care Team: Olin Hauser, DO as PCP - General (Family Medicine) Tamyah Cutbirth, Virl Diamond, RPH-CPP as Pharmacist   Recent consult visits: Office Visit with [facility] Pain Management Psychologist on 6/10  Hospital visits: None in previous 6 months  Objective:  Lab Results  Component Value Date   CREATININE 0.83 11/10/2020   CREATININE 1.07 07/13/2019   CREATININE 0.80 07/08/2018    Lab Results  Component Value Date   HGBA1C 5.7 (H) 02/13/2021   Last diabetic Eye exam:  Lab Results  Component Value Date/Time   HMDIABEYEEXA No Retinopathy 04/27/2020 12:00 AM    Last diabetic Foot exam: No results found for: HMDIABFOOTEX      Component Value Date/Time   CHOL 118 11/10/2020 1048   CHOL 104 06/26/2015 1126   TRIG 235 (H) 11/10/2020 1048   HDL 37 (L) 11/10/2020 1048   HDL 30 (L) 06/26/2015 1126   CHOLHDL 3.2 11/10/2020 1048   VLDL 52 (H) 07/18/2016 1041   LDLCALC 52 11/10/2020 1048    Hepatic Function Latest Ref Rng & Units 11/10/2020 07/13/2019 07/08/2018  Total Protein 6.1 - 8.1 g/dL 6.5 7.3 7.3   Albumin 3.5 - 5.0 g/dL - - -  AST 10 - 35 U/L $Remo'20 21 21  'mMPXI$ ALT 9 - 46 U/L $Remo'17 20 20  'Tdkxg$ Alk Phosphatase 38 - 126 U/L - - -  Total Bilirubin 0.2 - 1.2 mg/dL 0.4 0.5 0.4  Bilirubin, Direct 0.1 - 0.5 mg/dL - - -    Social History   Tobacco Use  Smoking Status Former   Pack years: 0.00   Types: Cigarettes   Start date: 08/29/1979   Quit date: 11/04/2010   Years since quitting: 10.5  Smokeless Tobacco Former   Quit date: 11/19/2010  Tobacco Comments   1 to 1.5 packs per day   BP Readings from Last 3 Encounters:  02/06/21 120/72  11/27/20 138/81  11/01/20 132/75   Pulse Readings from Last 3 Encounters:  02/06/21 72  11/27/20 73  11/01/20 68   Wt Readings from Last 3 Encounters:  02/06/21 214 lb (97.1 kg)  11/27/20 220 lb (99.8 kg)  11/01/20 221 lb (100.2 kg)    Assessment: Review of patient past medical history, allergies, medications, health status, including review of consultants reports, laboratory and other test data, was performed as part of comprehensive evaluation and provision of chronic care management services.   SDOH:  (Social Determinants of Health) assessments and interventions performed: none   CCM Care Plan  Allergies  Allergen Reactions   Benzoin Other (See Comments)    blisters   Butrans [Buprenorphine]     Severe  Burn pain patch, blisters   Doxycycline Hyclate Nausea Only   Morphine Other (See Comments)   Penicillins Other (See Comments)   Tape Rash    Medications Reviewed Today     Reviewed by Vella Raring, RPH-CPP (Pharmacist) on 04/06/21 at 1213  Med List Status: <None>   Medication Order Taking? Sig Documenting Provider Last Dose Status Informant  acetaminophen (TYLENOL) 500 MG tablet [id number] Yes Take 500 mg by mouth daily as needed. [provider] Taking Active   Alcohol Swabs (B-D SINGLE USE SWABS REGULAR) PADS [id number]  Check blood sugar 2 times daily. Karamalegos, Devonne Doughty, DO  Active   ALPRAZolam Duanne Moron) 1 MG tablet  [id number] Yes Take 1 mg by mouth 3 (three) times daily as needed. Sherri Rad, MD Taking Active   amLODipine (NORVASC) 10 MG tablet [id number] No Take 1 tablet (10 mg total) by mouth daily.  Patient not taking: Reported on 04/06/2021   Olin Hauser, DO Not Taking Active   ARIPiprazole (ABILIFY) 2 MG tablet [id number] Yes Take 2 mg by mouth daily. [provider] Taking Active   aspirin EC 81 MG tablet [id number] Yes Take 81 mg by mouth daily. [provider] Taking Active   atorvastatin (LIPITOR) 10 MG tablet [id number] Yes TAKE 1 TABLET BY MOUTH DAILY AT 2 PM Karamalegos, Devonne Doughty, DO Taking Active   b complex vitamins capsule [id number] Yes Take by mouth. [provider] Taking Active   Blood Glucose Calibration (TRUE METRIX LEVEL 1) Low SOLN [id number]  Check blood sugar 2 times daily Karamalegos, Devonne Doughty, DO  Active   Blood Glucose Monitoring Suppl (TRUE METRIX AIR GLUCOSE METER) w/Device KIT [id number]  Check blood sugar 2 times daily Olin Hauser, DO  Active   buPROPion (WELLBUTRIN XL) 300 MG 24 hr tablet [id number] Yes Take 1 tablet (300 mg total) by mouth daily. Olin Hauser, DO Taking Active   Cholecalciferol 25 MCG (1000 UT) tablet [id number] Yes Take 1,000 Units by mouth.  [provider] Taking Active   CINNAMON PO [id number] Yes Take by mouth. [provider] Taking Active   Coenzyme Q10 100 MG TABS [id number] Yes Take 100 mg by mouth daily. [provider] Taking Active   desoximetasone (TOPICORT) 0.25 % cream [id number] Yes Apply 1 application topically 2 (two) times daily as needed. Olin Hauser, DO Taking Active   diclofenac sodium (VOLTAREN) 1 % GEL [id number] Yes Apply topically. [provider] Taking Active   dicyclomine (BENTYL) 20 MG tablet [id number] Yes TAKE 1 TABLET (20 MG TOTAL) BY MOUTH EVERY 6 (SIX) HOURS [provider] Taking Active            Med Note  [name], Brighten Orndoff A   Mon Jun 12, 2020  9:15 AM) As needed  DULoxetine (CYMBALTA) 30 MG capsule [id number] No Take 1 capsule (30 mg total) by mouth daily. Take with existing $RemoveBefor'60mg'XjiwffAZyUjs$  dose = total daily dose $RemoveBe'90mg'bEsgVfVzR$   Patient not taking: Reported on 04/06/2021   Olin Hauser, DO Not Taking Active   DULoxetine (CYMBALTA) 60 MG capsule [id number] No Take 1 capsule (60 mg total) by mouth daily. Take with existing $RemoveBefor'30mg'yCfQCBnCWOJL$  dose = total daily dose $RemoveBe'90mg'vKCSQbWip$   Patient not taking: Reported on 04/06/2021   Olin Hauser, DO Not Taking Active   fluticasone [facility]) 50 MCG/ACT nasal spray [id number] No Place 2 sprays into both nostrils daily.  Patient not taking: Reported on 04/06/2021   [provider] Not Taking Active  gabapentin (NEURONTIN) 300 MG capsule [id number] Yes Take 3 capsules by mouth 3 (three) times daily. [provider] Taking Active            Med Note [name], Atlee Kluth A   Wed Nov 08, 2020  4:56 PM) Reports taking 900 mg in the morning and at noon, and take 1200 mg (4 capsules) nightly as directed by [facility]  hydrochlorothiazide (HYDRODIURIL) 25 MG tablet [id number] Yes Take 1 tablet (25 mg total) by mouth daily. Olin Hauser, DO Taking Active   lisinopril (ZESTRIL) 10 MG tablet [id number] Yes Take 1 tablet by mouth daily. [provider] Taking Active   meloxicam (MOBIC) 7.5 MG tablet [id number] No Take 7.5 mg by mouth 2 (two) times daily.   Patient not taking: Reported on 04/06/2021   [provider] Not Taking Active   metFORMIN (GLUCOPHAGE) 1000 MG tablet [id number] Yes TAKE 1 TABLET BY MOUTH  TWICE DAILY WITH A MEAL Karamalegos, Devonne Doughty, DO Taking Active   Multiple Vitamin (MULTIVITAMIN) tablet [id number] Yes Take 1 tablet by mouth daily. [provider] Taking Active   NEEDLE, DISP, 18 G (B-D DISP NEEDLE TW 18GX1") 18G X 1" MISC [id number]  For use to draw up medication into the syringe Stoioff, Scott C, MD  Active    NEEDLE, DISP, 21 G (BD SAFETYGLIDE SHIELDED NEEDLE) 21G X 1-1/2" [place] [id number]  For use of administering medication in the IM location Stoioff, Ronda Fairly, MD  Active   Omega-3 Fatty Acids (FISH OIL) 1200 MG CAPS [id number] Yes Take 1 capsule (1,200 mg total) by mouth daily.  Patient taking differently: Take 1,200 mg by mouth 2 (two) times daily.   Olin Hauser, DO Taking Active   OZEMPIC, 1 MG/DOSE, 4 MG/3ML Bonney Aid [id number] Yes INJECT 1 MG INTO THE SKIN ONCE A WEEK Karamalegos, Devonne Doughty, DO Taking Active   pantoprazole (PROTONIX) 40 MG tablet [id number] Yes Take 40 mg by mouth in the morning. [provider] Taking Active   polyethylene glycol (MIRALAX / GLYCOLAX) 17 g packet [id number] Yes Take 17 g by mouth daily as needed. [provider] Taking Active   propranolol (INDERAL) 40 MG tablet [id number]  TAKE 1 TABLET BY MOUTH THREE TIMES A DAY Karamalegos, Alexander Lenna Sciara, DO  Active   SODIUM FLUORIDE 5000 SENSITIVE 1.1-5 % GEL [id number]  Take by mouth. [provider]  Active   Syringe, Disposable, 3 ML MISC [id number]  Use syringes as directed by physician Abbie Sons, MD  Active   tamsulosin [facility]) 0.4 MG CAPS capsule [id number] Yes Take 1 capsule (0.4 mg total) by mouth daily. Abbie Sons, MD Taking Active   testosterone cypionate (DEPOTESTOSTERONE CYPIONATE) 200 MG/ML injection [id number] Yes Inject 1 mL (200 mg total) into the muscle every 14 (fourteen) days. Abbie Sons, MD Taking Active   tiZANidine (ZANAFLEX) 4 MG tablet [id number] Yes 1 tablet twice daily, May take an additional 1.5 tablet at bedtime [provider] Taking Active   traMADol (ULTRAM) 50 MG tablet [id number] Yes Take 50 mg by mouth every 6 (six) hours as needed. [provider] Taking Active   TRUE METRIX BLOOD GLUCOSE TEST test strip [id number]  Check blood sugar 2 times a day Olin Hauser, DO  Active   TRUEplus Lancets 33G MISC [id number]  Check blood  sugar 2 times a day Olin Hauser, DO  Active             Patient Active  Problem List   Diagnosis Date Noted   Erectile dysfunction due to arterial insufficiency 06/29/2019   Hypogonadism in male 06/29/2019   Pes cavus 04/30/2018   Chronic pain syndrome 02/18/2018   Chronic headaches 01/21/2018   Postoperative CSF leak 10/31/2017   Moderate episode of recurrent major depressive disorder ([place]) 07/21/2017   Obesity due to excess calories 08/07/2016   MCI (mild cognitive impairment) with memory loss 08/06/2016   Peripheral neuropathy 08/06/2016   Anxiety 07/17/2016   Insomnia 07/17/2016   Eczema 01/22/2016   Benign essential tremor 10/03/2015   Glaucoma 06/27/2015   B12 deficiency 06/27/2015   Cataplexy and narcolepsy 06/26/2015   Billowing mitral valve 06/26/2015   History of prolonged Q-T interval on ECG 06/26/2015   Herniation of nucleus pulposus 06/26/2015   Low testosterone 06/26/2015   Obesity (BMI 30.0-34.9) 06/26/2015   CAD (coronary artery disease), native coronary artery 06/26/2015   Fatty infiltration of liver 10/21/2014   Psoriasis 07/22/2014   Acid reflux 07/22/2014   Type 2 diabetes, controlled, with neuropathy ([place]) 06/22/2014   Hyperlipidemia associated with type 2 diabetes mellitus ([place]) 06/22/2014   Essential (primary) hypertension 06/22/2014   Failed back syndrome of lumbar spine 04/29/2014   Chronic pain associated with significant psychosocial dysfunction 04/29/2014   History of surgical procedure 04/29/2014   Polypharmacy 09/09/2013   Pain medication agreement signed 09/09/2013   Other long term (current) drug therapy 11/20/1998   Encounter for long-term (current) use of other medications 11/20/1998    Immunization History  Administered Date(s) Administered   Hep A / Hep B 10/21/2014, 01/04/2015, 06/21/2015   Influenza,inj,Quad PF,6+ Mos 06/26/2018, 07/13/2019   Influenza-Unspecified 06/28/2018, 08/22/2020   Moderna Sars-Covid-2  Vaccination 01/25/2020, 02/04/2020   PPD Test 02/06/2021   Pneumococcal Polysaccharide-23 07/18/2016   Tdap 07/19/2015, 06/02/2019    Conditions to be addressed/monitored: HTN, HLD and DMII  Care Plan : General Pharmacy (Adult)  Updates made by Vella Raring, RPH-CPP since 05/14/2021 12:00 AM     Problem: Disease Progression      Long-Range Goal: Disease Progression Prevented or Minimized   Start Date: 11/13/2020  Expected End Date: 02/11/2021  Recent Progress: On track  Priority: High  Note:   Current Barriers:  Financial Barriers in complicated patient with multiple medical conditions including T2DM, HTN, peripheral neuropathy, depression, GERD, tremors and chronic pain; patient has [employer] insurance Reports currently in coverage gap of Part D plan coverage Frustration with pill burden  Pharmacist Clinical Goal(s):  Over the next 90 days, patient will maintain control of blood sugar as evidenced by A1C >6.5% through collaboration with PharmD and provider.   Interventions: 1:1 collaboration with Olin Hauser, DO regarding development and update of comprehensive plan of care as evidenced by provider attestation and co-signature Inter-disciplinary care team collaboration (see longitudinal plan of care) Per review of chart, patient seen by [facility] Pain Management Psychologist on 6/10 and provider noted patient report of difficulty tolerating mood swings associated with going off of Cymbalta. On 6/3, patient had informed CM Pharmacist that he had stopped duloxetine ~5/25 following discussion with Pain Management provider to decrease pill burden Mr. Warwick reports since then, self-titrated himself back onto Cymbalta, now back on Cymbalta 90 mg daily. Reports currently having difficulty with getting refill through [facility] in time before runs out of current supply. Counsel patient to call [facility] Pain Management today to have refill called to local  pharmacy to pick up before out of current supply Patient  confirms having phone number and will call today. Reports follow up with [facility] Pain Management Psychiatry coming up on 7/19 Reports scheduled for head MRI tomorrow  Type 2 Diabetes: Controlled; current treatment: metformin 1000 mg twice daily Reports currently off of Ozempic as unable to afford (in coverage gap Part D coverage) Reports recent morning fasting readings ranging: ~90-120 Counsel on importance of dietary choices and exercise in blood sugar control Patient reports exercise limited by right hip and leg pain  Medication Assistance: Patient reports now in health plan coverage gap for calendar year and unable to afford Ozempic Review with patient [place] patient assistance program (PAP) income requirement for 2022 calendar year.  Based on reported income, patient does not qualify for Novo Nordisk PAP for 2022  Hypertension: Patient currently taking: Lisinopril 10 mg daily Propranolol IR $RemoveBefore'40mg'unqddcKpRTbDW$  BID (~8am and 5pm)  HCTZ 25 mg daily Reports recent home BP readings ranging ~120/68-82 Reports has occasional dizziness if he gets up too quickly, but okay if takes positional changes slowly Reports uses a cane for support sometimes. Encourage patient to use consistently Encourage patient to continue home BP monitoring, keep log of results, bring record to medical appointments and call providers for readings outside of established parameters, dizziness or new symptoms   Patient Goals/Self-Care Activities Over the next 90 days, patient will:  - take medications as prescribed - check glucose, document, and provide at future appointments - check blood pressure, document, and provide at future appointments  Follow Up Plan: Telephone follow up appointment with care management team member scheduled for: 9/2 at 10:30 am     Patient's preferred pharmacy is:  [id number] - [place], [place] [address] [place] [address] Phone: [phone] Fax: [phone]  [facility]  ([place]) - [address] [place], [address] [place] [address] Phone: [phone] Fax: [phone]   Follow Up:  Patient agrees to Care Plan and Follow-up.  Harlow Asa, PharmD, [place], CPP Clinical Pharmacist [facility] [phone]

## 2021-05-15 ENCOUNTER — Ambulatory Visit: Payer: Medicare Other

## 2021-05-23 ENCOUNTER — Ambulatory Visit
Admission: RE | Admit: 2021-05-23 | Discharge: 2021-05-23 | Disposition: A | Discharge status: Home / Self Care | Payer: Medicare Other | Source: Ambulatory Visit | Attending: Neurology | Admitting: Neurology

## 2021-05-23 ENCOUNTER — Other Ambulatory Visit: Payer: Self-pay

## 2021-05-23 ENCOUNTER — Other Ambulatory Visit: Payer: Self-pay | Admitting: Urology

## 2021-05-23 DIAGNOSIS — R413 Other amnesia: Secondary | ICD-10-CM | POA: Diagnosis not present

## 2021-05-23 MED ORDER — GADOBUTROL 1 MMOL/ML IV SOLN
9.0000 mL | Freq: Once | INTRAVENOUS | Status: AC | PRN
  Administered 2021-05-23: 9 mL via INTRAVENOUS

## 2021-06-14 DIAGNOSIS — S8392XA Sprain of unspecified site of left knee, initial encounter: Secondary | ICD-10-CM | POA: Diagnosis not present

## 2021-06-14 DIAGNOSIS — M25562 Pain in left knee: Secondary | ICD-10-CM | POA: Diagnosis not present

## 2021-06-14 DIAGNOSIS — M170 Bilateral primary osteoarthritis of knee: Secondary | ICD-10-CM | POA: Insufficient documentation

## 2021-06-14 DIAGNOSIS — M13862 Other specified arthritis, left knee: Secondary | ICD-10-CM | POA: Diagnosis not present

## 2021-06-18 DIAGNOSIS — M961 Postlaminectomy syndrome, not elsewhere classified: Secondary | ICD-10-CM | POA: Diagnosis not present

## 2021-06-18 DIAGNOSIS — G629 Polyneuropathy, unspecified: Secondary | ICD-10-CM | POA: Diagnosis not present

## 2021-06-19 ENCOUNTER — Ambulatory Visit (INDEPENDENT_AMBULATORY_CARE_PROVIDER_SITE_OTHER): Payer: Medicare Other

## 2021-06-19 VITALS — Ht 72.0 in | Wt 217.0 lb

## 2021-06-19 DIAGNOSIS — Z0000 Encounter for general adult medical examination without abnormal findings: Secondary | ICD-10-CM

## 2021-06-19 NOTE — Patient Instructions (Signed)
Dale [name] , Thank you for taking time to come for your Medicare Wellness Visit. I appreciate your ongoing commitment to your health goals. Please review the following plan we discussed and let me know if I can assist you in the future.   Screening recommendations/referrals: Colonoscopy: completed 11/18/2018 Recommended yearly ophthalmology/optometry visit for glaucoma screening and checkup Recommended yearly dental visit for hygiene and checkup  Vaccinations: Influenza vaccine: due Pneumococcal vaccine: due Tdap vaccine: completed 06/02/2019, due 06/01/2029 Shingles vaccine: discussed   Covid-19:  09/12/2020, 02/04/2020, 01/25/2020  Advanced directives: Advance directive discussed with you today. .  Conditions/risks identified: none  Next appointment: Follow up in one year for your annual wellness visit   Preventive Care 40-64 Years, Male Preventive care refers to lifestyle choices and visits with your health care provider that can promote health and wellness. What does preventive care include? A yearly physical exam. This is also called an annual well check. Dental exams once or twice a year. Routine eye exams. Ask your health care provider how often you should have your eyes checked. Personal lifestyle choices, including: Daily care of your teeth and gums. Regular physical activity. Eating a healthy diet. Avoiding tobacco and drug use. Limiting alcohol use. Practicing safe sex. Taking low-dose aspirin every day starting at age 84. What happens during an annual well check? The services and screenings done by your health care provider during your annual well check will depend on your age, overall health, lifestyle risk factors, and family history of disease. Counseling  Your health care provider may ask you questions about your: Alcohol use. Tobacco use. Drug use. Emotional well-being. Home and relationship well-being. Sexual activity. Eating habits. Work and work  Statistician. Screening  You may have the following tests or measurements: Height, weight, and BMI. Blood pressure. Lipid and cholesterol levels. These may be checked every 5 years, or more frequently if you are over 51 years old. Skin check. Lung cancer screening. You may have this screening every year starting at age 38 if you have a 30-pack-year history of smoking and currently smoke or have quit within the past 15 years. Fecal occult blood test (FOBT) of the stool. You may have this test every year starting at age 70. Flexible sigmoidoscopy or colonoscopy. You may have a sigmoidoscopy every 5 years or a colonoscopy every 10 years starting at age 33. Prostate cancer screening. Recommendations will vary depending on your family history and other risks. Hepatitis C blood test. Hepatitis B blood test. Sexually transmitted disease (STD) testing. Diabetes screening. This is done by checking your blood sugar (glucose) after you have not eaten for a while (fasting). You may have this done every 1-3 years. Discuss your test results, treatment options, and if necessary, the need for more tests with your health care provider. Vaccines  Your health care provider may recommend certain vaccines, such as: Influenza vaccine. This is recommended every year. Tetanus, diphtheria, and acellular pertussis (Tdap, Td) vaccine. You may need a Td booster every 10 years. Zoster vaccine. You may need this after age 56. Pneumococcal 13-valent conjugate (PCV13) vaccine. You may need this if you have certain conditions and have not been vaccinated. Pneumococcal polysaccharide (PPSV23) vaccine. You may need one or two doses if you smoke cigarettes or if you have certain conditions. Talk to your health care provider about which screenings and vaccines you need and how often you need them. This information is not intended to replace advice given to you by your health care provider. Make  sure you discuss any questions you  have with your health care provider. Document Released: 11/17/2015 Document Revised: 07/10/2016 Document Reviewed: 08/22/2015 Elsevier Interactive Patient Education  2017 [employer] Prevention in the Home Falls can cause injuries. They can happen to people of all ages. There are many things you can do to make your home safe and to help prevent falls. What can I do on the outside of my home? Regularly fix the edges of walkways and driveways and fix any cracks. Remove anything that might make you trip as you walk through a door, such as a raised step or threshold. Trim any bushes or trees on the path to your home. Use bright outdoor lighting. Clear any walking paths of anything that might make someone trip, such as rocks or tools. Regularly check to see if handrails are loose or broken. Make sure that both sides of any steps have handrails. Any raised decks and porches should have guardrails on the edges. Have any leaves, snow, or ice cleared regularly. Use sand or salt on walking paths during winter. Clean up any spills in your garage right away. This includes oil or grease spills. What can I do in the bathroom? Use night lights. Install grab bars by the toilet and in the tub and shower. Do not use towel bars as grab bars. Use non-skid mats or decals in the tub or shower. If you need to sit down in the shower, use a plastic, non-slip stool. Keep the floor dry. Clean up any water that spills on the floor as soon as it happens. Remove soap buildup in the tub or shower regularly. Attach bath mats securely with double-sided non-slip rug tape. Do not have throw rugs and other things on the floor that can make you trip. What can I do in the bedroom? Use night lights. Make sure that you have a light by your bed that is easy to reach. Do not use any sheets or blankets that are too big for your bed. They should not hang down onto the floor. Have a firm chair that has side arms. You can  use this for support while you get dressed. Do not have throw rugs and other things on the floor that can make you trip. What can I do in the kitchen? Clean up any spills right away. Avoid walking on wet floors. Keep items that you use a lot in easy-to-reach places. If you need to reach something above you, use a strong step stool that has a grab bar. Keep electrical cords out of the way. Do not use floor polish or wax that makes floors slippery. If you must use wax, use non-skid floor wax. Do not have throw rugs and other things on the floor that can make you trip. What can I do with my stairs? Do not leave any items on the stairs. Make sure that there are handrails on both sides of the stairs and use them. Fix handrails that are broken or loose. Make sure that handrails are as long as the stairways. Check any carpeting to make sure that it is firmly attached to the stairs. Fix any carpet that is loose or worn. Avoid having throw rugs at the top or bottom of the stairs. If you do have throw rugs, attach them to the floor with carpet tape. Make sure that you have a light switch at the top of the stairs and the bottom of the stairs. If you do not have them, ask  someone to add them for you. What else can I do to help prevent falls? Wear shoes that: Do not have high heels. Have rubber bottoms. Are comfortable and fit you well. Are closed at the toe. Do not wear sandals. If you use a stepladder: Make sure that it is fully opened. Do not climb a closed stepladder. Make sure that both sides of the stepladder are locked into place. Ask someone to hold it for you, if possible. Clearly mark and make sure that you can see: Any grab bars or handrails. First and last steps. Where the edge of each step is. Use tools that help you move around (mobility aids) if they are needed. These include: Canes. Walkers. Scooters. Crutches. Turn on the lights when you go into a dark area. Replace any light  bulbs as soon as they burn out. Set up your furniture so you have a clear path. Avoid moving your furniture around. If any of your floors are uneven, fix them. If there are any pets around you, be aware of where they are. Review your medicines with your doctor. Some medicines can make you feel dizzy. This can increase your chance of falling. Ask your doctor what other things that you can do to help prevent falls. This information is not intended to replace advice given to you by your health care provider. Make sure you discuss any questions you have with your health care provider. Document Released: 08/17/2009 Document Revised: 03/28/2016 Document Reviewed: 11/25/2014 Elsevier Interactive Patient Education  2017 [employer].

## 2021-06-19 NOTE — Progress Notes (Signed)
I connected with Dale [name] today by telephone and verified that I am speaking with the correct person using two identifiers. Location patient: home Location provider: work Persons participating in the virtual visit: Dale [name], [name] LPN.   I discussed the limitations, risks, security and privacy concerns of performing an evaluation and management service by telephone and the availability of in person appointments. I also discussed with the patient that there may be a patient responsible charge related to this service. The patient expressed understanding and verbally consented to this telephonic visit.    Interactive audio and video telecommunications were attempted between this provider and patient, however failed, due to patient having technical difficulties OR patient did not have access to video capability.  We continued and completed visit with audio only.     Vital signs may be patient reported or missing.  Subjective:   Dale [name]. is a 60 y.o. male who presents for an Initial Medicare Annual Wellness Visit.  Review of Systems     Cardiac Risk Factors include: advanced age (>50mn, >>67women);diabetes mellitus;dyslipidemia;hypertension;male gender;sedentary lifestyle     Objective:    Today's Vitals   06/19/21 1140 06/19/21 1141  Weight: 217 lb (98.4 kg)   Height: 6' (1.829 m)   PainSc:  7    Body mass index is 29.43 kg/m.  Advanced Directives 06/19/2021 11/18/2018 10/10/2017  Does Patient Have a Medical Advance Directive? No No No  Would patient like information on creating a medical advance directive? - No - Patient declined -  Some encounter information is confidential and restricted. Go to Review Flowsheets activity to see all data.    Current Medications (verified) Outpatient Encounter Medications as of 06/19/2021  Medication Sig   acetaminophen (TYLENOL) 500 MG tablet Take 500 mg by mouth daily as needed.   Alcohol Swabs (B-D SINGLE  USE SWABS REGULAR) PADS Check blood sugar 2 times daily.   ALPRAZolam (XANAX) 1 MG tablet Take 1 mg by mouth 3 (three) times daily as needed.   ARIPiprazole (ABILIFY) 2 MG tablet Take 2 mg by mouth daily.   aspirin EC 81 MG tablet Take 81 mg by mouth daily.   atorvastatin (LIPITOR) 10 MG tablet TAKE 1 TABLET BY MOUTH DAILY AT 2 PM   b complex vitamins capsule Take by mouth.   Blood Glucose Calibration (TRUE METRIX LEVEL 1) Low SOLN Check blood sugar 2 times daily   Blood Glucose Monitoring Suppl (TRUE METRIX AIR GLUCOSE METER) w/Device KIT Check blood sugar 2 times daily   buPROPion (WELLBUTRIN XL) 300 MG 24 hr tablet Take 1 tablet (300 mg total) by mouth daily.   Cholecalciferol 25 MCG (1000 UT) tablet Take 1,000 Units by mouth.    CINNAMON PO Take by mouth.   Coenzyme Q10 100 MG TABS Take 100 mg by mouth daily.   desoximetasone (TOPICORT) 0.25 % cream Apply 1 application topically 2 (two) times daily as needed.   diclofenac sodium (VOLTAREN) 1 % GEL Apply topically.   dicyclomine (BENTYL) 20 MG tablet TAKE 1 TABLET (20 MG TOTAL) BY MOUTH EVERY 6 (SIX) HOURS   DULoxetine (CYMBALTA) 30 MG capsule Take 1 capsule by mouth daily.   DULoxetine (CYMBALTA) 60 MG capsule Take 1 capsule by mouth daily.   fluticasone (FLONASE) 50 MCG/ACT nasal spray Place 2 sprays into both nostrils daily. (Patient not taking: Reported on 04/06/2021)   gabapentin (NEURONTIN) 300 MG capsule Take 3 capsules by mouth 3 (three) times daily.  hydrochlorothiazide (HYDRODIURIL) 25 MG tablet Take 1 tablet (25 mg total) by mouth daily.   lisinopril (ZESTRIL) 10 MG tablet Take 1 tablet by mouth daily.   metFORMIN (GLUCOPHAGE) 1000 MG tablet TAKE 1 TABLET BY MOUTH  TWICE DAILY WITH A MEAL   Multiple Vitamin (MULTIVITAMIN) tablet Take 1 tablet by mouth daily.   NEEDLE, DISP, 18 G (B-D DISP NEEDLE TW 18GX1") 18G X 1" MISC For use to draw up medication into the syringe   NEEDLE, DISP, 21 G (BD SAFETYGLIDE SHIELDED NEEDLE) 21G X  1-1/2" MISC For use of administering medication in the IM location   Omega-3 Fatty Acids (FISH OIL) 1200 MG CAPS Take 1 capsule (1,200 mg total) by mouth daily. (Patient taking differently: Take 1,200 mg by mouth 2 (two) times daily.)   OZEMPIC, 1 MG/DOSE, 4 MG/3ML SOPN INJECT 1 MG INTO THE SKIN ONCE A WEEK (Patient not taking: Reported on 05/14/2021)   pantoprazole (PROTONIX) 40 MG tablet Take 40 mg by mouth in the morning.   polyethylene glycol (MIRALAX / GLYCOLAX) 17 g packet Take 17 g by mouth daily as needed.   propranolol (INDERAL) 40 MG tablet TAKE 1 TABLET BY MOUTH THREE TIMES A DAY (Patient taking differently: Take 40 mg by mouth 2 (two) times daily.)   SODIUM FLUORIDE 5000 SENSITIVE 1.1-5 % GEL Take by mouth.   Syringe, Disposable, 3 ML MISC Use syringes as directed by physician   tamsulosin (FLOMAX) 0.4 MG CAPS capsule Take 1 capsule (0.4 mg total) by mouth daily.   testosterone cypionate (DEPOTESTOSTERONE CYPIONATE) 200 MG/ML injection Inject 1 mL (200 mg total) into the muscle every 14 (fourteen) days.   tiZANidine (ZANAFLEX) 4 MG tablet 1 tablet twice daily, May take an additional 1.5 tablet at bedtime   traMADol (ULTRAM) 50 MG tablet Take 50 mg by mouth every 6 (six) hours as needed.   TRUE METRIX BLOOD GLUCOSE TEST test strip Check blood sugar 2 times a day   TRUEplus Lancets 33G MISC Check blood sugar 2 times a day   No facility-administered encounter medications on file as of 06/19/2021.    Allergies (verified) Benzoin, Butrans [buprenorphine], Doxycycline hyclate, Morphine, Penicillins, and Tape   History: Past Medical History:  Diagnosis Date   ADHD (attention deficit hyperactivity disorder)    Anxiety    Coronary artery disease    Coronary artery disease    Coronary atherosclerosis    Depression    GERD (gastroesophageal reflux disease)    Glaucoma    Headache    migrane   Heart disease    Hyperlipidemia    Hypertension    Hypertriglyceridemia    Hypogonadism  in male    MVP (mitral valve prolapse)    Narcolepsy and cataplexy    Non-alcoholic fatty liver disease    Past Surgical History:  Procedure Laterality Date   COLONOSCOPY WITH PROPOFOL N/A 11/18/2018   Procedure: COLONOSCOPY WITH PROPOFOL;  Surgeon: Manya Silvas, MD;  Location: [facility] ENDOSCOPY;  Service: Endoscopy;  Laterality: N/A;   ESOPHAGOGASTRODUODENOSCOPY (EGD) WITH PROPOFOL N/A 11/18/2018   Procedure: ESOPHAGOGASTRODUODENOSCOPY (EGD) WITH PROPOFOL;  Surgeon: Manya Silvas, MD;  Location: [facility] ENDOSCOPY;  Service: Endoscopy;  Laterality: N/A;   pain pump implant and removal Bilateral    Dec. 21 2019   SPINE SURGERY     Multiple surgeries, initial 2001, thoracic disc repair and recurrent revisions.   THROAT SURGERY     Family History  Problem Relation Age of Onset   Depression Mother  Anxiety disorder Mother    Diabetes Mother    Heart failure Mother    Heart attack Father    Diabetes Sister    Obesity Sister    Drug abuse Brother    Alcohol abuse Brother    Depression Sister    Anxiety disorder Sister    Drug abuse Sister    Anxiety disorder Sister    Seizures Sister    Diabetes Sister    Depression Sister    Social History   Socioeconomic History   Marital status: Married    Spouse name: Not on file   Number of children: Not on file   Years of education: 1 yr of college   Highest education level: Not on file  Occupational History   Occupation: Disabled  Tobacco Use   Smoking status: Former    Types: Cigarettes    Start date: 08/29/1979    Quit date: 11/04/2010    Years since quitting: 10.6   Smokeless tobacco: Former    Quit date: 11/19/2010   Tobacco comments:    1 to 1.5 packs per day  Vaping Use   Vaping Use: Never used  Substance and Sexual Activity   Alcohol use: No    Alcohol/week: 0.0 standard drinks   Drug use: No   Sexual activity: Not Currently  Other Topics Concern   Not on file  Social History Narrative   Not on file   Social  Determinants of Health   Financial Resource Strain: Low Risk    Difficulty of Paying Living Expenses: Not hard at all  Food Insecurity: No Food Insecurity   Worried About Charity fundraiser in the Last Year: Never true   [place] in the Last Year: Never true  Transportation Needs: No Transportation Needs   Lack of Transportation (Medical): No   Lack of Transportation (Non-Medical): No  Physical Activity: Inactive   Days of Exercise per Week: 0 days   Minutes of Exercise per Session: 0 min  Stress: No Stress Concern Present   Feeling of Stress : Not at all  Social Connections: Not on file    Tobacco Counseling Counseling given: Not Answered Tobacco comments: 1 to 1.5 packs per day   Clinical Intake:  Pre-visit preparation completed: Yes  Pain : 0-10 Pain Score: 7  Pain Type: Acute pain Pain Location: Knee Pain Orientation: Left Pain Descriptors / Indicators: Aching Pain Onset: In the past 7 days Pain Frequency: Constant     Nutritional Status: BMI 25 -29 Overweight Nutritional Risks: None Diabetes: Yes  How often do you need to have someone help you when you read instructions, pamphlets, or other written materials from your doctor or pharmacy?: 1 - Never What is the last grade level you completed in school?: 46yrcollege  Diabetic? Yes Nutrition Risk Assessment:  Has the patient had any N/V/D within the last 2 months?  No  Does the patient have any non-healing wounds?  No  Has the patient had any unintentional weight loss or weight gain?  No   Diabetes:  Is the patient diabetic?  Yes  If diabetic, was a CBG obtained today?  No  Did the patient bring in their glucometer from home?  No  How often do you monitor your CBG's? Twice daily.   Financial Strains and Diabetes Management:  Are you having any financial strains with the device, your supplies or your medication? Yes .  Does the patient want to be seen by Chronic  Care Management for management of  their diabetes?  No  Would the patient like to be referred to a Nutritionist or for Diabetic Management?  No   Diabetic Exams:  Diabetic Eye Exam: Overdue for diabetic eye exam. Pt has been advised about the importance in completing this exam. Patient advised to call and schedule an eye exam. Diabetic Foot Exam: Completed 07/25/2020   Interpreter Needed?: No  Information entered by :: NAllen LPN   Activities of Daily Living In your present state of health, do you have any difficulty performing the following activities: 06/19/2021 07/25/2020  Hearing? N N  Vision? N N  Difficulty concentrating or making decisions? Y N  Comment trouble with recall -  Walking or climbing stairs? Y N  Dressing or bathing? N N  Doing errands, shopping? N N  Preparing Food and eating ? N -  Using the Toilet? N -  In the past six months, have you accidently leaked urine? N -  Do you have problems with loss of bowel control? N -  Managing your Medications? N -  Managing your Finances? N -  Housekeeping or managing your Housekeeping? N -  Some recent data might be hidden    Patient Care Team: Olin Hauser, DO as PCP - General (Family Medicine) Curley Spice, Virl Diamond, RPH-CPP as Pharmacist  Indicate any recent Medical Services you may have received from other than [facility] providers in the past year (date may be approximate).     Assessment:   This is a routine wellness examination for Padraig.  Hearing/Vision screen Vision Screening - Comments:: Regular eye exams, [facility]  Dietary issues and exercise activities discussed: Current Exercise Habits: The patient does not participate in regular exercise at present   Goals Addressed             This Visit's Progress    Patient Stated       06/19/2021, wants to get down to 199 pounds       Depression Screen PHQ 2/9 Scores 06/19/2021 07/25/2020 01/19/2020 07/20/2019 04/13/2019 01/14/2019 07/14/2018  PHQ - 2 Score _0 0 1 4 -  PHQ- 9 Score  _1 -  Exception Documentation - - - - - - Patient refusal    Fall Risk Fall Risk  06/19/2021 02/06/2021 11/01/2020 07/25/2020 01/19/2020  Falls in the past year? 1 1 0 0 0  Comment tripping on own feet - - - -  Number falls in past yr: 1 1 0 0 0  Injury with Fall? 0 1 0 0 0  Risk for fall due to : Medication side effect - - - -  Follow up Falls evaluation completed;Education provided;Falls prevention discussed Falls evaluation completed Falls evaluation completed Falls evaluation completed Falls evaluation completed    FALL RISK PREVENTION PERTAINING TO THE HOME:  Any stairs in or around the home? Yes  If so, are there any without handrails? No  Home free of loose throw rugs in walkways, pet beds, electrical cords, etc? Yes  Adequate lighting in your home to reduce risk of falls? Yes   ASSISTIVE DEVICES UTILIZED TO PREVENT FALLS:  Life alert? No  Use of a cane, walker or w/c? Yes  Grab bars in the bathroom? No  Shower chair or bench in shower? No  Elevated toilet seat or a handicapped toilet? No   TIMED UP AND GO:  Was the test performed? No .      Cognitive Function:  Immunizations Immunization History  Administered Date(s) Administered   Hep A / Hep B 10/21/2014, 01/04/2015, 06/21/2015   Influenza,inj,Quad PF,6+ Mos 06/26/2018, 07/13/2019   Influenza-Unspecified 06/28/2018, 08/22/2020   Moderna Sars-Covid-2 Vaccination 01/25/2020, 02/04/2020   PPD Test 02/06/2021   Pneumococcal Polysaccharide-23 07/18/2016   Tdap 07/19/2015, 06/02/2019    TDAP status: Up to date  Flu Vaccine status: Due, Education has been provided regarding the importance of this vaccine. Advised may receive this vaccine at local pharmacy or Health Dept. Aware to provide a copy of the vaccination record if obtained from local pharmacy or Health Dept. Verbalized acceptance and understanding.  Pneumococcal vaccine status: Due, Education has been provided regarding the importance of  this vaccine. Advised may receive this vaccine at local pharmacy or Health Dept. Aware to provide a copy of the vaccination record if obtained from local pharmacy or Health Dept. Verbalized acceptance and understanding.  Covid-19 vaccine status: Completed vaccines  Qualifies for Shingles Vaccine? Yes   Zostavax completed No   Shingrix Completed?: No.    Education has been provided regarding the importance of this vaccine. Patient has been advised to call insurance company to determine out of pocket expense if they have not yet received this vaccine. Advised may also receive vaccine at local pharmacy or Health Dept. Verbalized acceptance and understanding.  Screening Tests Health Maintenance  Topic Date Due   Zoster Vaccines- Shingrix (1 of 2) Never done   Pneumococcal Vaccine 73-22 Years old (2 - PCV) 07/18/2017   COVID-19 Vaccine (3 - Mixed Product risk series) 03/03/2020   OPHTHALMOLOGY EXAM  04/27/2021   INFLUENZA VACCINE  06/04/2021   FOOT EXAM  07/25/2021   HEMOGLOBIN A1C  08/15/2021   COLONOSCOPY (Pts 45-27yr Insurance coverage will need to be confirmed)  11/19/2023   TETANUS/TDAP  06/01/2029   PNEUMOCOCCAL POLYSACCHARIDE VACCINE AGE 43-64 HIGH RISK  Completed   Hepatitis C Screening  Completed   HIV Screening  Completed   HPV VACCINES  Aged Out    Health Maintenance  Health Maintenance Due  Topic Date Due   Zoster Vaccines- Shingrix (1 of 2) Never done   Pneumococcal Vaccine 07589Years old (2 - PCV) 07/18/2017   COVID-19 Vaccine (3 - Mixed Product risk series) 03/03/2020   OPHTHALMOLOGY EXAM  04/27/2021   INFLUENZA VACCINE  06/04/2021    Colorectal cancer screening: Type of screening: Colonoscopy. Completed 11/18/2018. Repeat every 5 years  Lung Cancer Screening: (Low Dose CT Chest recommended if Age 61-80years, 30 pack-year currently smoking OR have quit w/in 15years.) does not qualify.   Lung Cancer Screening Referral: no  Additional Screening:  Hepatitis C  Screening: does qualify; Completed 07/18/2016  Vision Screening: Recommended annual ophthalmology exams for early detection of glaucoma and other disorders of the eye. Is the patient up to date with their annual eye exam?  No  Who is the provider or what is the name of the office in which the patient attends annual eye exams? [facility] If pt is not established with a provider, would they like to be referred to a provider to establish care? No .   Dental Screening: Recommended annual dental exams for proper oral hygiene  Community Resource Referral / Chronic Care Management: CRR required this visit?  No   CCM required this visit?  No      Plan:     I have personally reviewed and noted the following in the patient's chart:   Medical and social history Use of alcohol, tobacco or  illicit drugs  Current medications and supplements including opioid prescriptions. Patient is not currently taking opioid prescriptions. Functional ability and status Nutritional status Physical activity Advanced directives List of other physicians Hospitalizations, surgeries, and ER visits in previous 12 months Vitals Screenings to include cognitive, depression, and falls Referrals and appointments  In addition, I have reviewed and discussed with patient certain preventive protocols, quality metrics, and best practice recommendations. A written personalized care plan for preventive services as well as general preventive health recommendations were provided to patient.     Kellie Simmering, LPN   [id number]/74/[id number]   Nurse Notes: 6 CIT not administered. Patient states he has trouble with recall and has a diagnosis of mild cognitive impairment.

## 2021-06-25 ENCOUNTER — Other Ambulatory Visit: Payer: Self-pay | Admitting: Family Medicine

## 2021-06-25 MED ORDER — SYRINGE (DISPOSABLE) 3 ML MISC: 0 refills | Status: DC

## 2021-06-28 ENCOUNTER — Other Ambulatory Visit: Payer: Self-pay

## 2021-07-05 ENCOUNTER — Ambulatory Visit: Payer: Self-pay | Admitting: Urology

## 2021-07-06 ENCOUNTER — Telehealth: Payer: Self-pay

## 2021-07-06 ENCOUNTER — Ambulatory Visit: Payer: Medicare Other | Admitting: Urology

## 2021-07-11 ENCOUNTER — Other Ambulatory Visit: Payer: Self-pay

## 2021-07-11 ENCOUNTER — Other Ambulatory Visit: Payer: Medicare Other

## 2021-07-11 DIAGNOSIS — Z0000 Encounter for general adult medical examination without abnormal findings: Secondary | ICD-10-CM

## 2021-07-11 DIAGNOSIS — E1140 Type 2 diabetes mellitus with diabetic neuropathy, unspecified: Secondary | ICD-10-CM | POA: Diagnosis not present

## 2021-07-11 DIAGNOSIS — E785 Hyperlipidemia, unspecified: Secondary | ICD-10-CM

## 2021-07-12 LAB — COMPREHENSIVE METABOLIC PANEL
AG Ratio: 1.8 (calc) (ref 1.0–2.5)
ALT: 14 U/L (ref 9–46)
AST: 16 U/L (ref 10–35)
Albumin: 4.3 g/dL (ref 3.6–5.1)
Alkaline phosphatase (APISO): 58 U/L (ref 35–144)
BUN: 18 mg/dL (ref 7–25)
CO2: 31 mmol/L (ref 20–32)
Calcium: 10.1 mg/dL (ref 8.6–10.3)
Chloride: 97 mmol/L — ABNORMAL LOW (ref 98–110)
Creat: 0.88 mg/dL (ref 0.70–1.35)
Globulin: 2.4 g/dL (calc) (ref 1.9–3.7)
Glucose, Bld: 100 mg/dL — ABNORMAL HIGH (ref 65–99)
Potassium: 3.9 mmol/L (ref 3.5–5.3)
Sodium: 137 mmol/L (ref 135–146)
Total Bilirubin: 0.5 mg/dL (ref 0.2–1.2)
Total Protein: 6.7 g/dL (ref 6.1–8.1)

## 2021-07-12 LAB — LIPID PANEL
Cholesterol: 114 mg/dL (ref <200)
HDL: 30 mg/dL — ABNORMAL LOW (ref >=40)
LDL Cholesterol (Calc): 54 mg/dL (calc)
Non-HDL Cholesterol (Calc): 84 mg/dL (calc) (ref <130)
Total CHOL/HDL Ratio: 3.8 (calc) (ref <5.0)
Triglycerides: 237 mg/dL — ABNORMAL HIGH (ref <150)

## 2021-07-12 LAB — HEMOGLOBIN A1C
Hgb A1c MFr Bld: 6 % of total Hgb — ABNORMAL HIGH (ref <5.7)
Mean Plasma Glucose: 126 mg/dL
eAG (mmol/L): 7 mmol/L

## 2021-07-16 ENCOUNTER — Other Ambulatory Visit: Payer: Self-pay | Admitting: Family Medicine

## 2021-07-16 DIAGNOSIS — E1140 Type 2 diabetes mellitus with diabetic neuropathy, unspecified: Secondary | ICD-10-CM

## 2021-07-17 ENCOUNTER — Other Ambulatory Visit: Payer: Medicare Other

## 2021-07-18 ENCOUNTER — Encounter: Payer: Medicare HMO | Admitting: Family Medicine

## 2021-07-18 ENCOUNTER — Encounter: Payer: Medicare Other | Admitting: Family Medicine

## 2021-07-23 DIAGNOSIS — R2689 Other abnormalities of gait and mobility: Secondary | ICD-10-CM | POA: Diagnosis not present

## 2021-07-23 DIAGNOSIS — R413 Other amnesia: Secondary | ICD-10-CM | POA: Diagnosis not present

## 2021-07-24 ENCOUNTER — Encounter: Payer: Medicare Other | Admitting: Family Medicine

## 2021-07-24 ENCOUNTER — Other Ambulatory Visit: Payer: Self-pay | Admitting: Student

## 2021-07-24 DIAGNOSIS — R413 Other amnesia: Secondary | ICD-10-CM

## 2021-07-27 ENCOUNTER — Other Ambulatory Visit: Payer: Self-pay

## 2021-07-27 ENCOUNTER — Ambulatory Visit
Admission: RE | Admit: 2021-07-27 | Discharge: 2021-07-27 | Disposition: A | Discharge status: Home / Self Care | Payer: Medicare Other | Source: Ambulatory Visit | Attending: Student | Admitting: Student

## 2021-07-27 DIAGNOSIS — J019 Acute sinusitis, unspecified: Secondary | ICD-10-CM | POA: Diagnosis not present

## 2021-07-27 DIAGNOSIS — R413 Other amnesia: Secondary | ICD-10-CM | POA: Insufficient documentation

## 2021-07-27 MED ORDER — GADOBUTROL 1 MMOL/ML IV SOLN
10.0000 mL | Freq: Once | INTRAVENOUS | Status: AC | PRN
  Administered 2021-07-27: 10 mL via INTRAVENOUS

## 2021-07-29 ENCOUNTER — Other Ambulatory Visit: Payer: Self-pay | Admitting: Urology

## 2021-07-30 ENCOUNTER — Other Ambulatory Visit: Payer: Self-pay | Admitting: Family Medicine

## 2021-07-30 MED ORDER — TAMSULOSIN HCL 0.4 MG PO CAPS: 0.4000 mg | ORAL_CAPSULE | Freq: Every day | ORAL | 2 refills | Status: DC

## 2021-08-02 ENCOUNTER — Other Ambulatory Visit: Payer: Self-pay | Admitting: Family Medicine

## 2021-08-02 DIAGNOSIS — E1169 Type 2 diabetes mellitus with other specified complication: Secondary | ICD-10-CM

## 2021-08-03 NOTE — Telephone Encounter (Signed)
Requested medications are due for refill today yes, but has current rx at different mail order pharmacy  Requested medications are on the active medication list yes  Last refill 06/08/21  Last visit 02/06/21  Future visit scheduled 08/06/21  Notes to clinic Upcoming visit next week. Has one refill left at different mail order pharm, please assess.

## 2021-08-06 ENCOUNTER — Encounter: Payer: Self-pay | Admitting: Family Medicine

## 2021-08-06 ENCOUNTER — Other Ambulatory Visit: Payer: Self-pay

## 2021-08-06 ENCOUNTER — Ambulatory Visit (INDEPENDENT_AMBULATORY_CARE_PROVIDER_SITE_OTHER): Payer: Medicare Other | Admitting: Family Medicine

## 2021-08-06 VITALS — BP 128/58 | HR 62 | Ht 72.0 in | Wt 222.6 lb

## 2021-08-06 DIAGNOSIS — F331 Major depressive disorder, recurrent, moderate: Secondary | ICD-10-CM

## 2021-08-06 DIAGNOSIS — E785 Hyperlipidemia, unspecified: Secondary | ICD-10-CM

## 2021-08-06 DIAGNOSIS — I10 Essential (primary) hypertension: Secondary | ICD-10-CM

## 2021-08-06 DIAGNOSIS — Z23 Encounter for immunization: Secondary | ICD-10-CM

## 2021-08-06 DIAGNOSIS — E1169 Type 2 diabetes mellitus with other specified complication: Secondary | ICD-10-CM

## 2021-08-06 DIAGNOSIS — E1140 Type 2 diabetes mellitus with diabetic neuropathy, unspecified: Secondary | ICD-10-CM | POA: Diagnosis not present

## 2021-08-06 DIAGNOSIS — Z0000 Encounter for general adult medical examination without abnormal findings: Secondary | ICD-10-CM

## 2021-08-06 MED ORDER — OZEMPIC (0.25 OR 0.5 MG/DOSE) 2 MG/1.5ML ~~LOC~~ SOPN: 0.2500 mg | PEN_INJECTOR | SUBCUTANEOUS | 0 refills | Status: DC

## 2021-08-06 NOTE — Assessment & Plan Note (Signed)
Significantly improved DM control now A1c 6.0 Off Ozempic still Improved w/ wt loss, metformin Resolved hyperglycemia, more stabilized sugar now Complications - peripheral neuropathy (also secondary to multiple back surgeries, with nerve damage), Hyperlipidemia, GERD  Plan:  1. Unable to get Ozempic due to cost / medicare donut hole - will give sample of Ozempic today can use 0.25 or 0.5mg  intermittently or regularly until 2023, may re order ozempic when indicated\ Continue Metformin 1000mg  BID  2. Encourage improved lifestyle - low carb, low sugar diet, reduce portion size, continue improving regular exercise 3. Check CBG, bring log to next visit for review 4. Continue ACEi, Statin

## 2021-08-06 NOTE — Assessment & Plan Note (Signed)
Stable, controlled chronic mood disorder Followed by Psychiatry [facility] On Duloxetine, Wellbutrin, Alprazolam

## 2021-08-06 NOTE — Patient Instructions (Addendum)
Thank you for coming to the office today.  Flu Shot today  Reminder to follow up as scheduled w/ ENT - they can discuss chronic sinusitis, abnormal smell, nasal bone, and Imaging results.  Updated COVID19 booster when ready at pharmacy.  Ozempic sample start low dose 0.25mg  weekly inj to see if this helps, if need to increase that is fine, only has 4 doses at 0.5mg  or could be 8 doses at 0.25mg  weekly. Contact in 2023 when ready for new order.   Please schedule a Follow-up Appointment to: Return in about 6 months (around 02/04/2022) for 6 month follow-up DM A1c, HTN.  If you have any other questions or concerns, please feel free to call the office or send a message through [place]. You may also schedule an earlier appointment if necessary.  Additionally, you may be receiving a survey about your experience at our office within a few days to 1 week by e-mail or mail. We value your feedback.  [name], DO [place]

## 2021-08-06 NOTE — Assessment & Plan Note (Signed)
Controlled LDL on statin Last lipid panel 07/2021 Calculated ASCVD 10 yr risk score elevated  Plan: Continue current meds - Atorvastatin 10mg  Encourage improved lifestyle - low carb/cholesterol, reduce portion size, continue improving regular exercise

## 2021-08-06 NOTE — Assessment & Plan Note (Signed)
HTN controlled currently Continue current therapy with Propranolol TID, Lisinopril 10mg , HCTZ 25mg  daily

## 2021-08-06 NOTE — Progress Notes (Signed)
Subjective:    Patient ID: Dale [name]., male    DOB: 05-01-1961, 60 y.o.   MRN: [id number]  Dale [name]. is a 60 y.o. male presenting on 08/06/2021 for Annual Exam   HPI  Here for Annual Physical and Lab Review.  CHRONIC HTN: Now improved BP Followed by [facility] Cardiology Dr Nehemiah Massed Home readings for BP - show Fluctuation of BP  Current Meds - Amlodipine 10mg  daily,  HCTZ 25mg  daily, Propranolol 40mg  TID Reports good compliance, took meds today. Tolerating well, w/o complaints. Denies CP, dyspnea, HA, edema, dizziness / lightheadedness  Type 2 DM with peripheral neuropathy Hyperlipidemia T2DM Last A1c 6.0% (07/11/21) CBG 100-120s avg now Lab reviewed On Statin He was out of medication for months due to cost. Now in donut hole medicare Upcoming DM Eye exam this week 10/6   Tremors Propranolol 40mg  TID - improved tremors  Recurrent Major Depression Followed by Psychiatry CBC Currently on medication management  Additional update - recent fall and facial injury, he has been seen by [facility] who did MRI, showed L maxillary sinus thickening mild. He has upcoming [facility] apt in October for further evaluation. He still has nasal bone soreness. Chronic Sinusitis Foul Smell He will see ENT to discuss further.  Health Maintenance: Due for Flu Shot, will receive today  Ready for COVID19 booster updated version soon  Depression screen [facility] 2/9 08/06/2021 06/19/2021 07/25/2020  Decreased Interest 2 3 2   Down, Depressed, Hopeless 2 3 1   PHQ - 2 Score 4 6 3   Altered sleeping 0 0 0  Tired, decreased energy 1 3 1   Change in appetite 2 0 0  Feeling bad or failure about yourself  0 0 0  Trouble concentrating 0 3 0  Moving slowly or fidgety/restless 0 0 0  Suicidal thoughts 0 0 0  PHQ-9 Score 7 12 4   Difficult doing work/chores Very difficult Somewhat difficult Somewhat difficult  Some recent data might be hidden    Past Medical History:  Diagnosis  Date   ADHD (attention deficit hyperactivity disorder)    Anxiety    Coronary artery disease    Coronary artery disease    Coronary atherosclerosis    Depression    GERD (gastroesophageal reflux disease)    Glaucoma    Headache    migrane   Heart disease    Hyperlipidemia    Hypertension    Hypertriglyceridemia    Hypogonadism in male    MVP (mitral valve prolapse)    Narcolepsy and cataplexy    Non-alcoholic fatty liver disease    Past Surgical History:  Procedure Laterality Date   COLONOSCOPY WITH PROPOFOL N/A 11/18/2018   Procedure: COLONOSCOPY WITH PROPOFOL;  Surgeon: Manya Silvas, MD;  Location: [facility] ENDOSCOPY;  Service: Endoscopy;  Laterality: N/A;   ESOPHAGOGASTRODUODENOSCOPY (EGD) WITH PROPOFOL N/A 11/18/2018   Procedure: ESOPHAGOGASTRODUODENOSCOPY (EGD) WITH PROPOFOL;  Surgeon: Manya Silvas, MD;  Location: [facility] ENDOSCOPY;  Service: Endoscopy;  Laterality: N/A;   pain pump implant and removal Bilateral    Dec. 21 2019   SPINE SURGERY     Multiple surgeries, initial 2001, thoracic disc repair and recurrent revisions.   THROAT SURGERY     Social History   Socioeconomic History   Marital status: Married    Spouse name: Not on file   Number of children: Not on file   Years of education: 1 yr of college   Highest education level: Not on file  Occupational  History   Occupation: Disabled  Tobacco Use   Smoking status: Former    Types: Cigarettes    Start date: 08/29/1979    Quit date: 11/04/2010    Years since quitting: 10.7   Smokeless tobacco: Former    Quit date: 11/19/2010   Tobacco comments:    1 to 1.5 packs per day  Vaping Use   Vaping Use: Never used  Substance and Sexual Activity   Alcohol use: No    Alcohol/week: 0.0 standard drinks   Drug use: No   Sexual activity: Not Currently  Other Topics Concern   Not on file  Social History Narrative   Not on file   Social Determinants of Health   Financial Resource Strain: Low Risk     Difficulty of Paying Living Expenses: Not hard at all  Food Insecurity: No Food Insecurity   Worried About Charity fundraiser in the Last Year: Never true   [place] in the Last Year: Never true  Transportation Needs: No Transportation Needs   Lack of Transportation (Medical): No   Lack of Transportation (Non-Medical): No  Physical Activity: Inactive   Days of Exercise per Week: 0 days   Minutes of Exercise per Session: 0 min  Stress: No Stress Concern Present   Feeling of Stress : Not at all  Social Connections: Not on file  Intimate Partner Violence: Not on file   Family History  Problem Relation Age of Onset   Depression Mother    Anxiety disorder Mother    Diabetes Mother    Heart failure Mother    Heart attack Father    Diabetes Sister    Obesity Sister    Drug abuse Brother    Alcohol abuse Brother    Depression Sister    Anxiety disorder Sister    Drug abuse Sister    Anxiety disorder Sister    Seizures Sister    Diabetes Sister    Depression Sister    Current Outpatient Medications on File Prior to Visit  Medication Sig   acetaminophen (TYLENOL) 500 MG tablet Take 500 mg by mouth daily as needed.   Alcohol Swabs (B-D SINGLE USE SWABS REGULAR) PADS Check blood sugar 2 times daily.   ALPRAZolam (XANAX) 1 MG tablet Take 1 mg by mouth 3 (three) times daily as needed.   ARIPiprazole (ABILIFY) 2 MG tablet Take 2 mg by mouth daily.   aspirin EC 81 MG tablet Take 81 mg by mouth daily.   atorvastatin (LIPITOR) 10 MG tablet TAKE 1 TABLET BY MOUTH  DAILY AT 2 PM   b complex vitamins capsule Take by mouth.   Blood Glucose Calibration (TRUE METRIX LEVEL 1) Low SOLN Check blood sugar 2 times daily   Blood Glucose Monitoring Suppl (TRUE METRIX AIR GLUCOSE METER) w/Device KIT Check blood sugar 2 times daily   buPROPion (WELLBUTRIN XL) 300 MG 24 hr tablet Take 1 tablet (300 mg total) by mouth daily.   CINNAMON PO Take by mouth.   Coenzyme Q10 100 MG TABS Take 100 mg by  mouth daily.   desoximetasone (TOPICORT) 0.25 % cream Apply 1 application topically 2 (two) times daily as needed.   diclofenac sodium (VOLTAREN) 1 % GEL Apply topically.   dicyclomine (BENTYL) 20 MG tablet TAKE 1 TABLET (20 MG TOTAL) BY MOUTH EVERY 6 (SIX) HOURS   DULoxetine (CYMBALTA) 30 MG capsule Take 1 capsule by mouth daily.   DULoxetine (CYMBALTA) 60 MG capsule Take 1  capsule by mouth daily.   fluticasone (FLONASE) 50 MCG/ACT nasal spray Place 2 sprays into both nostrils daily.   gabapentin (NEURONTIN) 300 MG capsule Take 3 capsules by mouth 3 (three) times daily.   hydrochlorothiazide (HYDRODIURIL) 25 MG tablet Take 1 tablet (25 mg total) by mouth daily.   lisinopril (ZESTRIL) 10 MG tablet Take 1 tablet by mouth daily.   metFORMIN (GLUCOPHAGE) 1000 MG tablet TAKE 1 TABLET BY MOUTH  TWICE DAILY WITH MEALS   Multiple Vitamin (MULTIVITAMIN) tablet Take 1 tablet by mouth daily.   NEEDLE, DISP, 18 G (B-D DISP NEEDLE TW 18GX1") 18G X 1" MISC For use to draw up medication into the syringe   NEEDLE, DISP, 21 G (BD SAFETYGLIDE SHIELDED NEEDLE) 21G X 1-1/2" MISC For use of administering medication in the IM location   Omega-3 Fatty Acids (FISH OIL) 1200 MG CAPS Take 1 capsule (1,200 mg total) by mouth daily. (Patient taking differently: Take 1,200 mg by mouth 2 (two) times daily.)   pantoprazole (PROTONIX) 40 MG tablet Take 40 mg by mouth in the morning.   polyethylene glycol (MIRALAX / GLYCOLAX) 17 g packet Take 17 g by mouth daily as needed.   propranolol (INDERAL) 40 MG tablet TAKE 1 TABLET BY MOUTH THREE TIMES A DAY (Patient taking differently: Take 40 mg by mouth 2 (two) times daily.)   SODIUM FLUORIDE 5000 SENSITIVE 1.1-5 % GEL Take by mouth.   Syringe, Disposable, 3 ML MISC Use syringes as directed by physician   tamsulosin (FLOMAX) 0.4 MG CAPS capsule Take 1 capsule (0.4 mg total) by mouth daily.   testosterone cypionate (DEPOTESTOSTERONE CYPIONATE) 200 MG/ML injection Inject 1 mL (200 mg  total) into the muscle every 14 (fourteen) days.   tiZANidine (ZANAFLEX) 4 MG tablet 1 tablet twice daily, May take an additional 1.5 tablet at bedtime   traMADol (ULTRAM) 50 MG tablet Take 50 mg by mouth every 6 (six) hours as needed.   TRUE METRIX BLOOD GLUCOSE TEST test strip Check blood sugar 2 times a day   TRUEplus Lancets 33G MISC Check blood sugar 2 times a day   No current facility-administered medications on file prior to visit.    Review of Systems  Constitutional:  Negative for activity change, appetite change, chills, diaphoresis, fatigue and fever.  HENT:  Negative for congestion and hearing loss.   Eyes:  Negative for visual disturbance.  Respiratory:  Negative for cough, chest tightness, shortness of breath and wheezing.   Cardiovascular:  Negative for chest pain, palpitations and leg swelling.  Gastrointestinal:  Negative for abdominal pain, constipation, diarrhea, nausea and vomiting.  Genitourinary:  Negative for dysuria, frequency and hematuria.  Musculoskeletal:  Negative for arthralgias and neck pain.  Skin:  Negative for rash.  Neurological:  Negative for dizziness, weakness, light-headedness, numbness and headaches.  Hematological:  Negative for adenopathy.  Psychiatric/Behavioral:  Negative for behavioral problems, dysphoric mood and sleep disturbance.   Per HPI unless specifically indicated above      Objective:    BP (!) 128/58   Pulse 62   Ht 6' (1.829 m)   Wt 222 lb 9.6 oz (101 kg)   SpO2 98%   BMI 30.19 kg/m   Wt Readings from Last 3 Encounters:  08/06/21 222 lb 9.6 oz (101 kg)  07/27/21 217 lb (98.4 kg)  06/19/21 217 lb (98.4 kg)    Physical Exam Vitals and nursing note reviewed.  Constitutional:      General: He is not in acute  distress.    Appearance: He is well-developed. He is not diaphoretic.     Comments: Well-appearing, comfortable, cooperative  HENT:     Head: Normocephalic and atraumatic.  Eyes:     General:        Right eye:  No discharge.        Left eye: No discharge.     Conjunctiva/sclera: Conjunctivae normal.     Pupils: Pupils are equal, round, and reactive to light.  Neck:     Thyroid: No thyromegaly.     Vascular: No carotid bruit.  Cardiovascular:     Rate and Rhythm: Normal rate and regular rhythm.     Pulses: Normal pulses.     Heart sounds: Normal heart sounds. No murmur heard. Pulmonary:     Effort: Pulmonary effort is normal. No respiratory distress.     Breath sounds: Normal breath sounds. No wheezing or rales.  Abdominal:     General: Bowel sounds are normal. There is no distension.     Palpations: Abdomen is soft. There is no mass.     Tenderness: There is no abdominal tenderness.  Musculoskeletal:        General: No tenderness. Normal range of motion.     Cervical back: Normal range of motion and neck supple.     Right lower leg: No edema.     Left lower leg: No edema.     Comments: Upper / Lower Extremities: - Normal muscle tone, strength bilateral upper extremities 5/5, lower extremities 5/5  Lymphadenopathy:     Cervical: No cervical adenopathy.  Skin:    General: Skin is warm and dry.     Findings: No erythema or rash.  Neurological:     Mental Status: He is alert and oriented to person, place, and time.     Comments: Distal sensation intact to light touch all extremities  Psychiatric:        Mood and Affect: Mood normal.        Behavior: Behavior normal.        Thought Content: Thought content normal.     Comments: Well groomed, good eye contact, normal speech and thoughts    I have personally reviewed the radiology report from 07/30/21 MRI results.  [facility]  CLINICAL DATA:  Provided history: Memory loss. Additional history  provided by scanning technologist: Memory loss for 1 year.   EXAM:  MRI HEAD WITHOUT AND WITH CONTRAST   TECHNIQUE:  Multiplanar, multiecho pulse sequences of the brain and surrounding  structures were obtained without  and with intravenous contrast.   CONTRAST:  106mL GADAVIST GADOBUTROL 1 MMOL/ML IV SOLN   COMPARISON:  Brain MRI 05/23/2021.   FINDINGS:  Brain:   Cerebral volume is normal for age.   No cortical encephalomalacia is identified. No significant cerebral  white matter disease for age.   There is no acute infarct.   No evidence of an intracranial mass.   No chronic intracranial blood products.   No extra-axial fluid collection.   No midline shift.   No pathologic intracranial enhancement identified.   Vascular: Maintained flow voids within the proximal large arterial  vessels. 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   Skull and upper cervical spine: No focal suspicious marrow lesion.   Sinuses/Orbits: Visualized orbits show no acute finding. Mild left  maxillary sinus mucosal thickening. Trace mucosal thickening within  the bilateral ethmoid air cells.   IMPRESSION:  Unremarkable  MRI appearance of the brain for age. No evidence of  acute intracranial abnormality.   Mild paranasal sinus disease, as described.   Electronically Signed    By: Kellie Simmering D.O.    On: 07/27/2021 10:19  Electronically signed by Nickola Major at 07/30/2021 8:34 AM EDT  Diabetic Foot Exam - Simple   Simple Foot Form Diabetic Foot exam was performed with the following findings: Yes 08/06/2021  2:58 PM  Visual Inspection See comments: Yes Sensation Testing See comments: Yes Pulse Check Posterior Tibialis and Dorsalis pulse intact bilaterally: Yes Comments Bilateral dramatic reduced monofilament sensory testing. Has some intact sensation dorsal only. Bilateral callus formation great toes and forefoot, has left > right with opened callus formation with some scab now healing.    Recent Labs    11/10/20 1048 02/13/21 0830 07/11/21 0852  HGBA1C 6.3* 5.7* 6.0*     Results for orders placed or performed in visit on  07/11/21  Lipid panel  Result Value Ref Range   Cholesterol 114 <200 mg/dL   HDL 30 (L) > OR = 40 mg/dL   Triglycerides 237 (H) <150 mg/dL   LDL Cholesterol (Calc) 54 mg/dL (calc)   Total CHOL/HDL Ratio 3.8 <5.0 (calc)   Non-HDL Cholesterol (Calc) 84 <130 mg/dL (calc)  Comprehensive Metabolic Panel (CMET)  Result Value Ref Range   Glucose, Bld 100 (H) 65 - 99 mg/dL   BUN 18 7 - 25 mg/dL   Creat 0.88 0.70 - 1.35 mg/dL   BUN/Creatinine Ratio NOT APPLICABLE 6 - 22 (calc)   Sodium 137 135 - 146 mmol/L   Potassium 3.9 3.5 - 5.3 mmol/L   Chloride 97 (L) 98 - 110 mmol/L   CO2 31 20 - 32 mmol/L   Calcium 10.1 8.6 - 10.3 mg/dL   Total Protein 6.7 6.1 - 8.1 g/dL   Albumin 4.3 3.6 - 5.1 g/dL   Globulin 2.4 1.9 - 3.7 g/dL (calc)   AG Ratio 1.8 1.0 - 2.5 (calc)   Total Bilirubin 0.5 0.2 - 1.2 mg/dL   Alkaline phosphatase (APISO) 58 35 - 144 U/L   AST 16 10 - 35 U/L   ALT 14 9 - 46 U/L  HgB A1c  Result Value Ref Range   Hgb A1c MFr Bld 6.0 (H) <5.7 % of total Hgb   Mean Plasma Glucose 126 mg/dL   eAG (mmol/L) 7.0 mmol/L      Assessment & Plan:   Problem List Items Addressed This Visit     Type 2 diabetes, controlled, with neuropathy (HCC)    Significantly improved DM control now A1c 6.0 Off Ozempic still Improved w/ wt loss, metformin Resolved hyperglycemia, more stabilized sugar now Complications - peripheral neuropathy (also secondary to multiple back surgeries, with nerve damage), Hyperlipidemia, GERD  Plan:  1. Unable to get Ozempic due to cost / medicare donut hole - will give sample of Ozempic today can use 0.25 or 0.5mg  intermittently or regularly until 2023, may re order ozempic when indicated\ Continue Metformin 1000mg  BID  2. Encourage improved lifestyle - low carb, low sugar diet, reduce portion size, continue improving regular exercise 3. Check CBG, bring log to next visit for review 4. Continue ACEi, Statin      Relevant Medications   OZEMPIC, 0.25 OR 0.5  MG/DOSE, 2 MG/1.5ML SOPN   Moderate episode of recurrent major depressive disorder (HCC)    Stable, controlled chronic mood disorder Followed by Psychiatry CBC On Duloxetine, Wellbutrin, Alprazolam  Hyperlipidemia associated with type 2 diabetes mellitus ([place])    Controlled LDL on statin Last lipid panel 07/2021 Calculated ASCVD 10 yr risk score elevated  Plan: Continue current meds - Atorvastatin $RemoveBefore'10mg'[id number]$  Encourage improved lifestyle - low carb/cholesterol, reduce portion size, continue improving regular exercise      Relevant Medications   OZEMPIC, 0.25 OR 0.5 MG/DOSE, 2 MG/1.5ML SOPN   Essential (primary) hypertension    HTN controlled currently Continue current therapy with Propranolol TID, Lisinopril $RemoveBeforeD'10mg'[id number]$ , HCTZ $Remo'25mg'SMHFF$  daily      Other Visit Diagnoses     Annual physical exam    -  Primary   Needs flu shot       Relevant Orders   Flu Vaccine QUAD 53mo+IM (Fluarix, Fluzone & Alfiuria Quad PF) (Completed)       Updated Health Maintenance information Flu Shot today COVID booster when ready Reviewed recent lab results with patient Encouraged improvement to lifestyle with diet and exercise Goal of weight loss   Meds ordered this encounter  Medications   OZEMPIC, 0.25 OR 0.5 MG/DOSE, 2 MG/1.5ML SOPN    Sig: Inject 0.25 mg into the skin once a week.    Dispense:  1.5 mL    Refill:  0      Follow up plan: Return in about 6 months (around 02/04/2022) for 6 month follow-up DM A1c, HTN.  [name], [facility] 08/06/2021, 2:51 PM

## 2021-08-09 DIAGNOSIS — E119 Type 2 diabetes mellitus without complications: Secondary | ICD-10-CM | POA: Diagnosis not present

## 2021-08-25 ENCOUNTER — Other Ambulatory Visit: Payer: Self-pay | Admitting: Urology

## 2021-08-31 ENCOUNTER — Other Ambulatory Visit: Payer: Self-pay

## 2021-08-31 ENCOUNTER — Ambulatory Visit (INDEPENDENT_AMBULATORY_CARE_PROVIDER_SITE_OTHER): Payer: Medicare Other | Admitting: Urology

## 2021-08-31 ENCOUNTER — Encounter: Payer: Self-pay | Admitting: Urology

## 2021-08-31 VITALS — BP 121/80 | HR 73 | Ht 72.0 in | Wt 215.0 lb

## 2021-08-31 DIAGNOSIS — N401 Enlarged prostate with lower urinary tract symptoms: Secondary | ICD-10-CM | POA: Diagnosis not present

## 2021-08-31 DIAGNOSIS — R3911 Hesitancy of micturition: Secondary | ICD-10-CM | POA: Diagnosis not present

## 2021-08-31 DIAGNOSIS — E291 Testicular hypofunction: Secondary | ICD-10-CM | POA: Diagnosis not present

## 2021-08-31 MED ORDER — TAMSULOSIN HCL 0.4 MG PO CAPS: 0.8000 mg | ORAL_CAPSULE | Freq: Every day | ORAL | 0 refills | Status: DC

## 2021-08-31 MED ORDER — TESTOSTERONE CYPIONATE 200 MG/ML IM SOLN: 200.0000 mg | INTRAMUSCULAR | 3 refills | Status: DC

## 2021-08-31 NOTE — Addendum Note (Signed)
Addended by: Abbie Sons on: 08/31/2021 12:57 PM   Modules accepted: Orders

## 2021-08-31 NOTE — Progress Notes (Addendum)
08/31/2021 12:19 PM   Dale [name]. August 31, 1961 [id number]  Referring provider: Olin Hauser, DO [address] [place],  [place] [address]  Chief Complaint  Patient presents with   Hypogonadism    Urologic history: 1.  Hypogonadism Testosterone cypionate every 2 weeks   2.  BPH with LUTS Tamsulosin 0.4 mg daily  3.  Erectile dysfunction   HPI: 60 y.o. male presents for annual follow-up.  Last visit 06/28/2020 and was complaining of moderate lower urinary tract symptoms; IPSS 16/35 Started on tamsulosin and he noted improvement in his voiding symptoms.  Still has some urinary hesitancy taking several seconds before he is able to initiate a stream Remains on testosterone cypionate Last labs 03/06/2021: Testosterone 266 ng/dL, hematocrit 48.7   PMH: Past Medical History:  Diagnosis Date   ADHD (attention deficit hyperactivity disorder)    Anxiety    Coronary artery disease    Coronary artery disease    Coronary atherosclerosis    Depression    GERD (gastroesophageal reflux disease)    Glaucoma    Headache    migrane   Heart disease    Hyperlipidemia    Hypertension    Hypertriglyceridemia    Hypogonadism in male    MVP (mitral valve prolapse)    Narcolepsy and cataplexy    Non-alcoholic fatty liver disease     Surgical History: Past Surgical History:  Procedure Laterality Date   COLONOSCOPY WITH PROPOFOL N/A 11/18/2018   Procedure: COLONOSCOPY WITH PROPOFOL;  Surgeon: Manya Silvas, MD;  Location: [facility] ENDOSCOPY;  Service: Endoscopy;  Laterality: N/A;   ESOPHAGOGASTRODUODENOSCOPY (EGD) WITH PROPOFOL N/A 11/18/2018   Procedure: ESOPHAGOGASTRODUODENOSCOPY (EGD) WITH PROPOFOL;  Surgeon: Manya Silvas, MD;  Location: [facility] ENDOSCOPY;  Service: Endoscopy;  Laterality: N/A;   pain pump implant and removal Bilateral    Dec. 21 2019   SPINE SURGERY     Multiple surgeries, initial 2001, thoracic disc repair and recurrent revisions.   THROAT  SURGERY      Home Medications:  Allergies as of 08/31/2021       Reactions   Benzoin Other (See Comments)   blisters   Doxycycline Hyclate Nausea Only   Morphine Other (See Comments)   Penicillins Other (See Comments)        Medication List        Accurate as of August 31, 2021 12:19 PM. If you have any questions, ask your nurse or doctor.          acetaminophen 500 MG tablet Commonly known as: TYLENOL Take 500 mg by mouth daily as needed.   ALPRAZolam 1 MG tablet Commonly known as: XANAX Take 1 mg by mouth 3 (three) times daily as needed.   ARIPiprazole 2 MG tablet Commonly known as: ABILIFY Take 2 mg by mouth daily.   aspirin EC 81 MG tablet Take 81 mg by mouth daily.   atorvastatin 10 MG tablet Commonly known as: LIPITOR TAKE 1 TABLET BY MOUTH  DAILY AT 2 PM   b complex vitamins capsule Take by mouth.   B-D DISP NEEDLE TW 18GX1" 18G X 1" Misc Generic drug: NEEDLE (DISP) 18 G For use to draw up medication into the syringe   B-D SINGLE USE SWABS REGULAR Pads Check blood sugar 2 times daily.   BD SafetyGlide Shielded Needle 21G X 1-1/2" Misc Generic drug: NEEDLE (DISP) 21 G For use of administering medication in the IM location   buPROPion 300 MG 24 hr tablet  Commonly known as: WELLBUTRIN XL Take 1 tablet (300 mg total) by mouth daily.   CINNAMON PO Take by mouth.   Coenzyme Q10 100 MG Tabs Take 100 mg by mouth daily.   desoximetasone 0.25 % cream Commonly known as: TOPICORT Apply 1 application topically 2 (two) times daily as needed.   diclofenac sodium 1 % Gel Commonly known as: VOLTAREN Apply topically.   dicyclomine 20 MG tablet Commonly known as: BENTYL TAKE 1 TABLET (20 MG TOTAL) BY MOUTH EVERY 6 (SIX) HOURS   DULoxetine 30 MG capsule Commonly known as: CYMBALTA Take 1 capsule by mouth daily.   DULoxetine 60 MG capsule Commonly known as: CYMBALTA Take 1 capsule by mouth daily.   Fish Oil 1200 MG Caps Take 1 capsule  (1,200 mg total) by mouth daily. What changed: when to take this   fluticasone 50 MCG/ACT nasal spray Commonly known as: FLONASE Place 2 sprays into both nostrils daily.   gabapentin 300 MG capsule Commonly known as: NEURONTIN Take 3 capsules by mouth 3 (three) times daily.   hydrochlorothiazide 25 MG tablet Commonly known as: HYDRODIURIL Take 1 tablet (25 mg total) by mouth daily.   lisinopril 10 MG tablet Commonly known as: ZESTRIL Take 1 tablet by mouth daily.   metFORMIN 1000 MG tablet Commonly known as: GLUCOPHAGE TAKE 1 TABLET BY MOUTH  TWICE DAILY WITH MEALS   multivitamin tablet Take 1 tablet by mouth daily.   Ozempic (0.25 or 0.5 MG/DOSE) 2 MG/1.5ML Sopn Generic drug: Semaglutide(0.25 or 0.5MG/DOS) Inject 0.25 mg into the skin once a week.   pantoprazole 40 MG tablet Commonly known as: PROTONIX Take 40 mg by mouth in the morning.   polyethylene glycol 17 g packet Commonly known as: MIRALAX / GLYCOLAX Take 17 g by mouth daily as needed.   propranolol 40 MG tablet Commonly known as: INDERAL TAKE 1 TABLET BY MOUTH THREE TIMES A DAY What changed: when to take this   Sodium Fluoride 5000 Sensitive 1.1-5 % Gel Generic drug: Sod Fluoride-Potassium Nitrate Take by mouth.   Syringe (Disposable) 3 ML Misc Use syringes as directed by physician   tamsulosin 0.4 MG Caps capsule Commonly known as: FLOMAX Take 1 capsule (0.4 mg total) by mouth daily.   testosterone cypionate 200 MG/ML injection Commonly known as: DEPOTESTOSTERONE CYPIONATE Inject 1 mL (200 mg total) into the muscle every 14 (fourteen) days.   tiZANidine 4 MG tablet Commonly known as: ZANAFLEX 1 tablet twice daily, May take an additional 1.5 tablet at bedtime   traMADol 50 MG tablet Commonly known as: ULTRAM Take 50 mg by mouth every 6 (six) hours as needed.   True Metrix Air Glucose Meter w/Device Kit Check blood sugar 2 times daily   True Metrix Blood Glucose Test test strip Generic  drug: glucose blood Check blood sugar 2 times a day   True Metrix Level 1 Low Soln Check blood sugar 2 times daily   TRUEplus Lancets 33G Misc Check blood sugar 2 times a day        Allergies:  Allergies  Allergen Reactions   Benzoin Other (See Comments)    blisters   Doxycycline Hyclate Nausea Only   Morphine Other (See Comments)   Penicillins Other (See Comments)    Family History: Family History  Problem Relation Age of Onset   Depression Mother    Anxiety disorder Mother    Diabetes Mother    Heart failure Mother    Heart attack Father    Diabetes  Sister    Obesity Sister    Drug abuse Brother    Alcohol abuse Brother    Depression Sister    Anxiety disorder Sister    Drug abuse Sister    Anxiety disorder Sister    Seizures Sister    Diabetes Sister    Depression Sister     Social History:  reports that he quit smoking about 10 years ago. His smoking use included cigarettes. He started smoking about 42 years ago. He quit smokeless tobacco use about 10 years ago. He reports that he does not drink alcohol and does not use drugs.   Physical Exam: BP 121/80   Pulse 73   Ht 6' (1.829 m)   Wt 215 lb (97.5 kg)   BMI 29.16 kg/m   Constitutional:  Alert and oriented, No acute distress. HEENT: [place] AT, moist mucus membranes.  Trachea midline, no masses. Cardiovascular: No clubbing, cyanosis, or edema. Respiratory: Normal respiratory effort, no increased work of breathing. GU: Prostate 35 g, smooth without nodules Skin: No rashes, bruises or suspicious lesions. Neurologic: Grossly intact, no focal deficits, moving all 4 extremities. Psychiatric: Normal mood and affect.   Assessment & Plan:    1.  Hypogonadism Last injection 2 weeks ago Trough level testosterone, PSA and hematocrit drawn today If stable lab visit 6 months testosterone/hematocrit and office visit 1 year for DRE and testosterone/hematocrit/PSA Testosterone refilled  2.  BPH with  LUTS Improved on tamsulosin but still with bothersome symptoms Titrate tamsulosin to 0.8 mg If no improvement in the next 30 days other options were discussed including adding a 5-ARI Outlet procedures were also discussed including UroLift, TURP He was given a Restaurant manager, fast food and will call back if interested   Abbie Sons, [place] [address], [place], [place] [address] [phone]

## 2021-09-01 LAB — HEMATOCRIT: Hematocrit: 53 % — ABNORMAL HIGH (ref 37.5–51.0)

## 2021-09-01 LAB — TESTOSTERONE: Testosterone: 108 ng/dL — ABNORMAL LOW (ref 264–916)

## 2021-09-01 LAB — PSA: Prostate Specific Ag, Serum: 0.5 ng/mL (ref 0.0–4.0)

## 2021-09-02 ENCOUNTER — Other Ambulatory Visit: Payer: Self-pay | Admitting: Urology

## 2021-09-02 DIAGNOSIS — E291 Testicular hypofunction: Secondary | ICD-10-CM

## 2021-09-02 DIAGNOSIS — D751 Secondary polycythemia: Secondary | ICD-10-CM

## 2021-09-03 ENCOUNTER — Encounter: Payer: Self-pay | Admitting: Urology

## 2021-09-03 ENCOUNTER — Encounter: Payer: Self-pay | Admitting: *Deleted

## 2021-09-06 NOTE — Progress Notes (Signed)
[facility]  [phone]) [phone] Fax:(336) [phone]  ID: [name] OB: [birth year]  MR#: [id number]  QIO#:[id number]  Patient Care Team: Olin Hauser, DO as PCP - General (Family Medicine) Curley Spice Virl Diamond, RPH-CPP as Pharmacist  CHIEF COMPLAINT: Polycythemia.  INTERVAL HISTORY: Patient is a 60 year old male who has been on testosterone replacement therapy for several years now who was noted to have an increasing hematocrit.  He currently feels well and is asymptomatic.  He has no neurologic complaints.  He denies any recent fevers or illnesses.  He has a good appetite and denies weight loss.  He has no chest pain, shortness of breath, cough, or hemoptysis.  He denies any nausea, vomiting, constipation, or diarrhea.  He has no urinary complaints.  Patient feels at his baseline and offers no specific complaints today.  REVIEW OF SYSTEMS:   Review of Systems  Constitutional: Negative.  Negative for fever, malaise/fatigue and weight loss.  Respiratory: Negative.  Negative for cough, hemoptysis and shortness of breath.   Cardiovascular: Negative.  Negative for chest pain and leg swelling.  Gastrointestinal: Negative.  Negative for abdominal pain.  Genitourinary: Negative.  Negative for dysuria.  Musculoskeletal: Negative.  Negative for back pain.  Skin: Negative.  Negative for rash.  Neurological: Negative.  Negative for dizziness, focal weakness, weakness and headaches.  Psychiatric/Behavioral:  The patient is not nervous/anxious.    As per HPI. Otherwise, a complete review of systems is negative.  PAST MEDICAL HISTORY: Past Medical History:  Diagnosis Date   ADHD (attention deficit hyperactivity disorder)    Anxiety    Coronary artery disease    Coronary artery disease    Coronary atherosclerosis    Depression    GERD (gastroesophageal reflux disease)    Glaucoma    Headache    migrane   Heart disease    Hyperlipidemia     Hypertension    Hypertriglyceridemia    Hypogonadism in male    MVP (mitral valve prolapse)    Narcolepsy and cataplexy    Non-alcoholic fatty liver disease     PAST SURGICAL HISTORY: Past Surgical History:  Procedure Laterality Date   COLONOSCOPY WITH PROPOFOL N/A 11/18/2018   Procedure: COLONOSCOPY WITH PROPOFOL;  Surgeon: Manya Silvas, MD;  [facility] ENDOSCOPY;  Service: Endoscopy;  Laterality: N/A;   ESOPHAGOGASTRODUODENOSCOPY (EGD) WITH PROPOFOL N/A 11/18/2018   Procedure: ESOPHAGOGASTRODUODENOSCOPY (EGD) WITH PROPOFOL;  Surgeon: Manya Silvas, MD;  Location: [facility] ENDOSCOPY;  Service: Endoscopy;  Laterality: N/A;   pain pump implant and removal Bilateral    Dec. 21 2019   SPINE SURGERY     Multiple surgeries, initial 2001, thoracic disc repair and recurrent revisions.   THROAT SURGERY      FAMILY HISTORY: Family History  Problem Relation Age of Onset   Depression Mother    Anxiety disorder Mother    Diabetes Mother    Heart failure Mother    Heart attack Father    Diabetes Sister    Obesity Sister    Drug abuse Brother    Alcohol abuse Brother    Depression Sister    Anxiety disorder Sister    Drug abuse Sister    Anxiety disorder Sister    Seizures Sister    Diabetes Sister    Depression Sister     ADVANCED DIRECTIVES (Y/N):  N  HEALTH MAINTENANCE: Social History   Tobacco Use   Smoking status: Former    Types: Cigarettes    Start  date: 08/29/1979    Quit date: 11/04/2010    Years since quitting: 10.8   Smokeless tobacco: Former    Quit date: 11/19/2010   Tobacco comments:    1 to 1.5 packs per day  Vaping Use   Vaping Use: Never used  Substance Use Topics   Alcohol use: No    Alcohol/week: 0.0 standard drinks   Drug use: No     Colonoscopy:  PAP:  Bone density:  Lipid panel:  Allergies  Allergen Reactions   Benzoin Other (See Comments)    blisters   Doxycycline Hyclate Nausea Only   Morphine Other (See Comments)    Penicillins Other (See Comments)    Current Outpatient Medications  Medication Sig Dispense Refill   acetaminophen (TYLENOL) 500 MG tablet Take 500 mg by mouth daily as needed.     Alcohol Swabs (B-D SINGLE USE SWABS REGULAR) PADS Check blood sugar 2 times daily. 200 each 5   ALPRAZolam (XANAX) 1 MG tablet Take 1 mg by mouth 3 (three) times daily as needed. 90 tablet 0   aspirin EC 81 MG tablet Take 81 mg by mouth daily.     atorvastatin (LIPITOR) 10 MG tablet TAKE 1 TABLET BY MOUTH  DAILY AT 2 PM 90 tablet 0   b complex vitamins capsule Take by mouth.     Blood Glucose Calibration (TRUE METRIX LEVEL 1) Low SOLN Check blood sugar 2 times daily 1 each 5   Blood Glucose Monitoring Suppl (TRUE METRIX AIR GLUCOSE METER) w/Device KIT Check blood sugar 2 times daily 1 kit 0   buPROPion (WELLBUTRIN XL) 300 MG 24 hr tablet Take 1 tablet (300 mg total) by mouth daily. 90 tablet 3   CINNAMON PO Take by mouth.     Coenzyme Q10 100 MG TABS Take 100 mg by mouth daily.     desoximetasone (TOPICORT) 0.25 % cream Apply 1 application topically 2 (two) times daily as needed. 15 g 2   diclofenac sodium (VOLTAREN) 1 % GEL Apply topically.     dicyclomine (BENTYL) 20 MG tablet   1   DULoxetine (CYMBALTA) 60 MG capsule Take 1 capsule (60 mg total) by mouth Two (2) times a day. Morning and early afternoon     gabapentin (NEURONTIN) 300 MG capsule Take 3 capsules by mouth 3 (three) times daily.     hydrochlorothiazide (HYDRODIURIL) 25 MG tablet Take 1 tablet (25 mg total) by mouth daily. 90 tablet 3   lisinopril (ZESTRIL) 10 MG tablet Take 1 tablet by mouth daily.     metFORMIN (GLUCOPHAGE) 1000 MG tablet TAKE 1 TABLET BY MOUTH  TWICE DAILY WITH MEALS 180 tablet 0   Multiple Vitamin (MULTIVITAMIN) tablet Take 1 tablet by mouth daily.     NEEDLE, DISP, 18 G (B-D DISP NEEDLE TW 18GX1") 18G X 1" MISC For use to draw up medication into the syringe 25 each 0   NEEDLE, DISP, 21 G (BD SAFETYGLIDE SHIELDED NEEDLE) 21G X  1-1/2" MISC For use of administering medication in the IM location 25 each 0   Omega-3 Fatty Acids (FISH OIL) 1200 MG CAPS Take 1 capsule (1,200 mg total) by mouth daily. (Patient taking differently: Take 1,200 mg by mouth 2 (two) times daily.)     OZEMPIC, 0.25 OR 0.5 MG/DOSE, 2 MG/1.5ML SOPN Inject 0.25 mg into the skin once a week. 1.5 mL 0   pantoprazole (PROTONIX) 40 MG tablet Take 40 mg by mouth in the morning.  0  polyethylene glycol (MIRALAX / GLYCOLAX) 17 g packet Take 17 g by mouth daily as needed.     propranolol (INDERAL) 40 MG tablet TAKE 1 TABLET BY MOUTH THREE TIMES A DAY 270 tablet 0   Syringe, Disposable, 3 ML MISC Use syringes as directed by physician 25 each 0   tamsulosin (FLOMAX) 0.4 MG CAPS capsule TAKE 1 CAPSULE BY MOUTH EVERY DAY (Patient taking differently: Take 0.8 mg by mouth daily.) 90 capsule 0   testosterone cypionate (DEPOTESTOSTERONE CYPIONATE) 200 MG/ML injection Inject 1 mL (200 mg total) into the muscle every 14 (fourteen) days. 2 mL 3   tiZANidine (ZANAFLEX) 4 MG tablet 1 tablet twice daily, May take an additional 1.5 tablet at bedtime     traMADol (ULTRAM) 50 MG tablet Take 50 mg by mouth every 6 (six) hours as needed.     TRUE METRIX BLOOD GLUCOSE TEST test strip Check blood sugar 2 times a day 200 each 5   TRUEplus Lancets 33G MISC Check blood sugar 2 times a day 200 each 5   No current facility-administered medications for this visit.    OBJECTIVE: Vitals:   09/12/21 1054  BP: 127/83  Pulse: 71  Resp: 16  Temp: (!) 96.9 F (36.1 C)  SpO2: 96%     Body mass index is 29.73 kg/m.    ECOG FS:0 - Asymptomatic  General: Well-developed, well-nourished, no acute distress. Eyes: Pink conjunctiva, anicteric sclera. HEENT: Normocephalic, moist mucous membranes. Lungs: No audible wheezing or coughing. Heart: Regular rate and rhythm. Abdomen: Soft, nontender, no obvious distention. Musculoskeletal: No edema, cyanosis, or clubbing. Neuro: Alert,  answering all questions appropriately. Cranial nerves grossly intact. Skin: No rashes or petechiae noted. Psych: Normal affect. Lymphatics: No cervical, calvicular, axillary or inguinal LAD.   LAB RESULTS:  Lab Results  Component Value Date   NA 137 07/11/2021   K 3.9 07/11/2021   CL 97 (L) 07/11/2021   CO2 31 07/11/2021   GLUCOSE 100 (H) 07/11/2021   BUN 18 07/11/2021   CREATININE 0.88 07/11/2021   CALCIUM 10.1 07/11/2021   PROT 6.7 07/11/2021   ALBUMIN 4.6 10/10/2017   AST 16 07/11/2021   ALT 14 07/11/2021   ALKPHOS 71 10/10/2017   BILITOT 0.5 07/11/2021   GFRNONAA 96 11/10/2020   GFRAA 111 11/10/2020    Lab Results  Component Value Date   WBC 8.4 09/12/2021   NEUTROABS 6,206 07/13/2019   HGB 15.8 09/12/2021   HCT 48.2 09/12/2021   MCV 87.6 09/12/2021   PLT 255 09/12/2021     STUDIES: No results found.  ASSESSMENT: Polycythemia.  PLAN:    Polycythemia: Patient's transiently elevated hematocrit likely was secondary to his testosterone replacement, but has now returned to normal limits with a hematocrit of 48.2 and hemoglobin of 15.8.  He does not require phlebotomy at this time, but we did discuss the possibility of donating blood in the community setting.  Interestingly, he has a mild iron deficiency but all of his other laboratory work is pending at time of dictation.  No intervention is needed at this time.  Patient will have a video assisted telemedicine visit in approximately 1 month after the Thanksgiving holiday to discuss his laboratory work and any additional diagnostic testing necessary.  I spent a total of 45 minutes reviewing chart data, face-to-face evaluation with the patient, counseling and coordination of care as detailed above.   Patient expressed understanding and was in agreement with this plan. He also understands that He can  call clinic at any time with any questions, concerns, or complaints.     Lloyd Huger, MD   09/12/2021 5:23  PM

## 2021-09-10 ENCOUNTER — Ambulatory Visit (INDEPENDENT_AMBULATORY_CARE_PROVIDER_SITE_OTHER): Payer: Medicare Other | Admitting: Pharmacist

## 2021-09-10 DIAGNOSIS — E1140 Type 2 diabetes mellitus with diabetic neuropathy, unspecified: Secondary | ICD-10-CM

## 2021-09-10 DIAGNOSIS — I10 Essential (primary) hypertension: Secondary | ICD-10-CM

## 2021-09-10 NOTE — Chronic Care Management (AMB) (Signed)
Chronic Care Management Pharmacy Note  09/10/2021 Name:  Dale [name]. MRN:  [id number] DOB:  09-11-1961  Subjective: Dale [name]. is an 60 y.o. year old male who is a primary patient of Olin Hauser, DO.  The CCM team was consulted for assistance with disease management and care coordination needs.    Engaged with patient by telephone for follow up visit in response to provider referral for pharmacy case management and/or care coordination services.   Consent to Services:  The patient was given information about Chronic Care Management services, agreed to services, and gave verbal consent prior to initiation of services.  Please see initial visit note for detailed documentation.   Patient Care Team: Olin Hauser, DO as PCP - General (Family Medicine) Curley Spice Virl Diamond, RPH-CPP as Pharmacist  Recent office visits: Office Visit with PCP on 10/3 for Annual Physical   Recent consult visits: Office Visit with [facility] on 10/28 Office Visit with [facility] on 10/4   Objective:  Lab Results  Component Value Date   CREATININE 0.88 07/11/2021   CREATININE 0.83 11/10/2020   CREATININE 1.07 07/13/2019    Lab Results  Component Value Date   HGBA1C 6.0 (H) 07/11/2021   Last diabetic Eye exam:  Lab Results  Component Value Date/Time   HMDIABEYEEXA No Retinopathy 04/27/2020 12:00 AM    Last diabetic Foot exam: No results found for: HMDIABFOOTEX      Component Value Date/Time   CHOL 114 07/11/2021 0852   CHOL 104 06/26/2015 1126   TRIG 237 (H) 07/11/2021 0852   HDL 30 (L) 07/11/2021 0852   HDL 30 (L) 06/26/2015 1126   CHOLHDL 3.8 07/11/2021 0852   VLDL 52 (H) 07/18/2016 1041   LDLCALC 54 07/11/2021 0852    Hepatic Function Latest Ref Rng & Units 07/11/2021 11/10/2020 07/13/2019  Total Protein 6.1 - 8.1 g/dL 6.7 6.5 7.3  Albumin 3.5 - 5.0 g/dL - - -  AST 10 - 35 U/L _0 ALT 9 - 46 U/L _1 Alk Phosphatase 38 - 126 U/L - - -  Total Bilirubin 0.2 - 1.2 mg/dL 0.5 0.4 0.5  Bilirubin, Direct 0.1 - 0.5 mg/dL - - -   Social History   Tobacco Use  Smoking Status Former   Types: Cigarettes   Start date: 08/29/1979   Quit date: 11/04/2010   Years since quitting: 10.8  Smokeless Tobacco Former   Quit date: 11/19/2010  Tobacco Comments   1 to 1.5 packs per day   BP Readings from Last 3 Encounters:  08/31/21 121/80  08/06/21 (!) 128/58  02/06/21 120/72   Pulse Readings from Last 3 Encounters:  08/31/21 73  08/06/21 62  02/06/21 72   Wt Readings from Last 3 Encounters:  08/31/21 215 lb (97.5 kg)  08/06/21 222 lb 9.6 oz (101 kg)  07/27/21 217 lb (98.4 kg)    Assessment: Review of patient past medical history, allergies, medications, health status, including review of consultants reports, laboratory and other test data, was performed as part of comprehensive evaluation and provision of chronic care management services.   SDOH:  (Social Determinants of Health) assessments and interventions performed:    CCM Care Plan  Allergies  Allergen Reactions   Benzoin Other (See Comments)    blisters   Doxycycline Hyclate Nausea Only   Morphine Other (See Comments)   Penicillins Other (See Comments)    Medications Reviewed Today  Reviewed by [name], RPH-CPP (Pharmacist) on 09/10/21 at 1312  Med List Status: <None>   Medication Order Taking? Sig Documenting Provider Last Dose Status Informant  acetaminophen (TYLENOL) 500 MG tablet [id number] Yes Take 500 mg by mouth daily as needed. [provider] Taking Active   Alcohol Swabs (B-D SINGLE USE SWABS REGULAR) PADS [id number]  Check blood sugar 2 times daily. Karamalegos, Devonne Doughty, DO  Active   ALPRAZolam Duanne Moron) 1 MG tablet [id number] Yes Take 1 mg by mouth 3 (three) times daily as needed. Sherri Rad, MD Taking Active   aspirin EC 81 MG tablet [id number] Yes Take 81 mg by mouth daily. [provider] Taking Active   atorvastatin (LIPITOR) 10 MG tablet [id number] Yes TAKE 1 TABLET BY MOUTH  DAILY AT 2 PM Jearld Fenton, NP Taking Active   b complex vitamins capsule [id number] Yes Take by mouth. [provider] Taking Active   Blood Glucose Calibration (TRUE METRIX LEVEL 1) Low SOLN [id number]  Check blood sugar 2 times daily Karamalegos, Devonne Doughty, DO  Active   Blood Glucose Monitoring Suppl (TRUE METRIX AIR GLUCOSE METER) w/Device KIT [id number]  Check blood sugar 2 times daily Olin Hauser, DO  Active   buPROPion (WELLBUTRIN XL) 300 MG 24 hr tablet [id number] Yes Take 1 tablet (300 mg total) by mouth daily. Olin Hauser, DO Taking Active   CINNAMON PO [id number] Yes Take by mouth. [provider] Taking Active   Coenzyme Q10 100 MG TABS [id number] Yes Take 100 mg by mouth daily. [provider] Taking Active   desoximetasone (TOPICORT) 0.25 % cream [id number] Yes Apply 1 application topically 2 (two) times daily as needed. Olin Hauser, DO Taking Active   diclofenac sodium (VOLTAREN) 1 % GEL [id number] Yes Apply topically. [provider] Taking Active   dicyclomine (BENTYL) 20 MG tablet [id number] No TAKE 1 TABLET (20 MG TOTAL) BY MOUTH EVERY 6 (SIX) HOURS  Patient not taking: Reported on 09/10/2021   [provider] Not Taking Active            Med Note [name], Madgeline Rayo A   Mon Jun 12, 2020  9:15 AM) As needed  DULoxetine (CYMBALTA) 60 MG capsule [id number] Yes Take 1 capsule (60 mg total) by mouth Two (2) times a day. Morning and early afternoon [provider] Taking Active   gabapentin (NEURONTIN) 300 MG capsule [id number] Yes Take 3 capsules by mouth 3 (three) times daily. [provider] Taking Active            Med Note [name], Ferrin Liebig A   Wed Nov 08, 2020  4:56 PM) Reports taking 900 mg in the morning and at noon, and take 1200 mg (4 capsules) nightly as directed by [facility]  hydrochlorothiazide (HYDRODIURIL) 25 MG tablet [id number] Yes Take 1 tablet (25 mg total) by mouth daily. Olin Hauser, DO Taking Active   lisinopril (ZESTRIL) 10 MG tablet [id number] Yes Take 1 tablet by mouth daily. [provider] Taking Active   metFORMIN (GLUCOPHAGE) 1000 MG tablet [id number] Yes TAKE 1 TABLET BY MOUTH  TWICE DAILY WITH MEALS Karamalegos, Devonne Doughty, DO Taking Active   Multiple Vitamin (MULTIVITAMIN) tablet [id number] Yes Take 1 tablet by mouth daily. [provider] Taking Active   NEEDLE, DISP, 18 G (B-D DISP NEEDLE TW 18GX1") 18G X 1" MISC [id number]  For use to draw up medication into the syringe Stoioff, Ronda Fairly, MD  Active  NEEDLE, DISP, 21 G (BD SAFETYGLIDE SHIELDED NEEDLE) 21G X 1-1/2" MISC [id number]  For use of administering medication in the IM location Stoioff, Ronda Fairly, MD  Active   Omega-3 Fatty Acids (FISH OIL) 1200 MG CAPS [id number] Yes Take 1 capsule (1,200 mg total) by mouth daily.  Patient taking differently: Take 1,200 mg by mouth 2 (two) times daily.   Olin Hauser, DO Taking Active   OZEMPIC, 0.25 OR 0.5 MG/DOSE, 2 MG/1.5ML SOPN [id number] Yes Inject 0.25 mg into the skin once a week. Olin Hauser, DO Taking Active   pantoprazole (PROTONIX) 40 MG tablet [id number] Yes Take 40 mg by mouth in the morning. [provider] Taking Active   polyethylene glycol (MIRALAX / GLYCOLAX) 17 g packet [id number] Yes Take 17 g by mouth daily as needed. [provider] Taking Active   propranolol (INDERAL) 40 MG tablet [id number] Yes TAKE 1 TABLET BY MOUTH THREE TIMES A DAY Olin Hauser, DO Taking Active   Syringe, Disposable, 3 ML MISC [id number]  Use syringes as directed by physician Bernardo Heater, Ronda Fairly, MD  Active   tamsulosin (FLOMAX) 0.4 MG CAPS capsule [id number] Yes TAKE 1 CAPSULE BY MOUTH EVERY DAY  Patient taking differently: Take 0.8 mg by mouth daily.   Abbie Sons, MD  Taking Active   testosterone cypionate (DEPOTESTOSTERONE CYPIONATE) 200 MG/ML injection [id number] Yes Inject 1 mL (200 mg total) into the muscle every 14 (fourteen) days. Abbie Sons, MD Taking Active   tiZANidine (ZANAFLEX) 4 MG tablet [id number] Yes 1 tablet twice daily, May take an additional 1.5 tablet at bedtime [provider] Taking Active   traMADol (ULTRAM) 50 MG tablet [id number] Yes Take 50 mg by mouth every 6 (six) hours as needed. [provider] Taking Active   TRUE METRIX BLOOD GLUCOSE TEST test strip [id number]  Check blood sugar 2 times a day Olin Hauser, DO  Active   TRUEplus Lancets 33G MISC [id number]  Check blood sugar 2 times a day Olin Hauser, DO  Active             Patient Active Problem List   Diagnosis Date Noted   Erectile dysfunction due to arterial insufficiency 06/29/2019   Hypogonadism in male 06/29/2019   Pes cavus 04/30/2018   Chronic pain syndrome 02/18/2018   Chronic headaches 01/21/2018   Postoperative CSF leak 10/31/2017   Moderate episode of recurrent major depressive disorder ([place]) 07/21/2017   Obesity due to excess calories 08/07/2016   MCI (mild cognitive impairment) with memory loss 08/06/2016   Peripheral neuropathy 08/06/2016   Anxiety 07/17/2016   Insomnia 07/17/2016   Eczema 01/22/2016   Benign essential tremor 10/03/2015   Glaucoma 06/27/2015   B12 deficiency 06/27/2015   Cataplexy and narcolepsy 06/26/2015   Billowing mitral valve 06/26/2015   History of prolonged Q-T interval on ECG 06/26/2015   Herniation of nucleus pulposus 06/26/2015   Low testosterone 06/26/2015   Obesity (BMI 30.0-34.9) 06/26/2015   CAD (coronary artery disease), native coronary artery 06/26/2015   Fatty infiltration of liver 10/21/2014   Psoriasis 07/22/2014   Acid reflux 07/22/2014   Type 2 diabetes, controlled, with neuropathy ([place]) 06/22/2014   Hyperlipidemia associated with type 2 diabetes mellitus ([place])  06/22/2014   Essential (primary) hypertension 06/22/2014   Failed back syndrome of lumbar spine 04/29/2014   Chronic pain associated with significant psychosocial dysfunction 04/29/2014   History of surgical procedure 04/29/2014   Polypharmacy 09/09/2013   Pain medication agreement  signed 09/09/2013   Other long term (current) drug therapy 11/20/1998   Encounter for long-term (current) use of other medications 11/20/1998    Immunization History  Administered Date(s) Administered   Hep A / Hep B 10/21/2014, 01/04/2015, 06/21/2015   Influenza,inj,Quad PF,6+ Mos 06/26/2018, 07/13/2019, 08/06/2021   Influenza-Unspecified 06/28/2018, 08/22/2020   Moderna Covid-19 Vaccine Bivalent Booster 79yrs & up 08/20/2021   Moderna Sars-Covid-2 Vaccination 01/25/2020, 02/04/2020, 09/12/2020   PPD Test 02/06/2021   Pneumococcal Polysaccharide-23 07/18/2016   Tdap 07/19/2015, 06/02/2019    Conditions to be addressed/monitored: HTN, HLD and DMII  Care Plan : General Pharmacy (Adult)  Updates made by [name], RPH-CPP since 09/10/2021 12:00 AM     Problem: Disease Progression      Long-Range Goal: Disease Progression Prevented or Minimized   Start Date: 11/13/2020  Expected End Date: 02/11/2021  This Visit's Progress: On track  Recent Progress: On track  Priority: High  Note:   Current Barriers:  Financial Barriers in complicated patient with multiple medical conditions including T2DM, HTN, peripheral neuropathy, depression, GERD, tremors and chronic pain; patient has [place] [employer] insurance Reports currently in coverage gap of Part D plan coverage Frustration with pill burden  Pharmacist Clinical Goal(s):  Over the next 90 days, patient will maintain control of blood sugar as evidenced by A1C >6.5% through collaboration with PharmD and provider.   Interventions: 1:1 collaboration with Olin Hauser, DO regarding development and update of  comprehensive plan of care as evidenced by provider attestation and co-signature Inter-disciplinary care team collaboration (see longitudinal plan of care) Perform chart review Office Visit with [place] on 10/28 Per 10/28 lab result message, patient referred to Hematology for evaluation/treatment of his elevated hematocrit Appointment scheduled on 11/9 Office Visit with [facility] on 10/4 Office Visit with PCP on 10/3 for Annual Physical  Encourage patient to reschedule appointment with ENT Comprehensive medication review performed; medication list updated in electronic medical record  Type 2 Diabetes: Controlled; current treatment: metformin 1000 mg twice daily Ozempic 0.25 mg weekly (from sample) Have discussed impact of dietary choices and exercise on blood sugar control Exercise: reports is more active now that he's working (getting out of the house) - working as bus Geophysicist/field seismologist for [employer] to exercise limited by knee pain (from fall in September)  Medication Assistance: Patient reports now in health plan coverage gap for calendar year and unable to afford Ozempic Review with patient [employer] patient assistance program (PAP) income requirement for 2022 calendar year.  Based on reported income, patient does not qualify for Novo Nordisk PAP for 2022  Hypertension: Patient currently taking: Lisinopril 10 mg daily Propranolol IR $RemoveBefore'40mg'xpKihoEEPaFFG$  three times daily HCTZ 25 mg daily Reports recent home BP readings ranging 120-130/70-80 Denies signs of hypotension Discuss continuing to take positional changes slowly Encourage patient to continue home BP monitoring, keep log of results, bring record to medical appointments and call providers for readings outside of established parameters, dizziness or new symptoms   Patient Goals/Self-Care Activities Over the next 90 days, patient will:  - take medications as prescribed - check glucose, document,  and provide at future appointments - check blood pressure, document, and provide at future appointments  Follow Up Plan: Telephone follow up appointment with care management team member scheduled for: 11/09/2020 at 11:30 am     Patient's preferred pharmacy is:  [id number] - [place], [place] [address] [place]  [place] [address] Phone: [phone] Fax: [phone]  [facility] ([place], [facility] [address] [place] [address] [place] [address] Phone: [phone] Fax: [phone]  [facility] ([facility]) - [place], [place] [address] Phone: [phone] Fax: [phone]   Follow Up:  Patient agrees to Care Plan and Follow-up.  Wallace Cullens, PharmD, Para [place], CPP Clinical Pharmacist [facility] [phone]

## 2021-09-10 NOTE — Patient Instructions (Signed)
Visit Information  Our goal A1c is less than 7%. This corresponds with fasting sugars less than 130 and 2 hour after meal sugars less than 180. Please check your blood sugar and keep a log of the results  Please check your blood pressure, keep record of results and bring this record with you to medical appointments.  Our goal bad cholesterol, or LDL, is less than 70 . This is why it is important to continue taking your atorvastatin  Feel free to call me with any questions or concerns. I look forward to our next call!   Wallace Cullens, PharmD, Para [place], CPP Clinical Pharmacist [facility] [phone]  The patient verbalized understanding of instructions, educational materials, and care plan provided today and declined offer to receive copy of patient instructions, educational materials, and care plan.   Telephone follow up appointment with care management team member scheduled for: 11/09/2020 at 11:30 am

## 2021-09-11 DIAGNOSIS — G6289 Other specified polyneuropathies: Secondary | ICD-10-CM | POA: Diagnosis not present

## 2021-09-11 DIAGNOSIS — M961 Postlaminectomy syndrome, not elsewhere classified: Secondary | ICD-10-CM | POA: Diagnosis not present

## 2021-09-11 DIAGNOSIS — G8921 Chronic pain due to trauma: Secondary | ICD-10-CM | POA: Diagnosis not present

## 2021-09-12 ENCOUNTER — Other Ambulatory Visit: Payer: Self-pay

## 2021-09-12 ENCOUNTER — Inpatient Hospital Stay: Payer: Medicare Other

## 2021-09-12 ENCOUNTER — Inpatient Hospital Stay: Payer: Medicare Other | Attending: Oncology | Admitting: Oncology

## 2021-09-12 VITALS — BP 127/83 | HR 71 | Temp 96.9°F | Resp 16 | Wt 219.2 lb

## 2021-09-12 DIAGNOSIS — Z87891 Personal history of nicotine dependence: Secondary | ICD-10-CM | POA: Insufficient documentation

## 2021-09-12 DIAGNOSIS — D751 Secondary polycythemia: Secondary | ICD-10-CM | POA: Diagnosis not present

## 2021-09-12 DIAGNOSIS — Z7984 Long term (current) use of oral hypoglycemic drugs: Secondary | ICD-10-CM | POA: Insufficient documentation

## 2021-09-12 DIAGNOSIS — Z7982 Long term (current) use of aspirin: Secondary | ICD-10-CM | POA: Diagnosis not present

## 2021-09-12 DIAGNOSIS — Z79899 Other long term (current) drug therapy: Secondary | ICD-10-CM | POA: Insufficient documentation

## 2021-09-12 DIAGNOSIS — I10 Essential (primary) hypertension: Secondary | ICD-10-CM | POA: Insufficient documentation

## 2021-09-12 DIAGNOSIS — E611 Iron deficiency: Secondary | ICD-10-CM | POA: Diagnosis not present

## 2021-09-12 LAB — IRON AND TIBC
Iron: 65 ug/dL (ref 45–182)
Saturation Ratios: 16 % — ABNORMAL LOW (ref 17.9–39.5)
TIBC: 403 ug/dL (ref 250–450)
UIBC: 338 ug/dL

## 2021-09-12 LAB — CBC
HCT: 48.2 % (ref 39.0–52.0)
Hemoglobin: 15.8 g/dL (ref 13.0–17.0)
MCH: 28.7 pg (ref 26.0–34.0)
MCHC: 32.8 g/dL (ref 30.0–36.0)
MCV: 87.6 fL (ref 80.0–100.0)
Platelets: 255 10*3/uL (ref 150–400)
RBC: 5.5 MIL/uL (ref 4.22–5.81)
RDW: 15.8 % — ABNORMAL HIGH (ref 11.5–15.5)
WBC: 8.4 10*3/uL (ref 4.0–10.5)
nRBC: 0 % (ref 0.0–0.2)

## 2021-09-12 LAB — FERRITIN: Ferritin: 16 ng/mL — ABNORMAL LOW (ref 24–336)

## 2021-09-12 NOTE — Progress Notes (Signed)
Pt has no concerns at this time. 

## 2021-09-13 LAB — CARBON MONOXIDE, BLOOD (PERFORMED AT REF LAB): Carbon Monoxide, Blood: 1.6 % (ref 0.0–3.6)

## 2021-09-14 LAB — ERYTHROPOIETIN: Erythropoietin: 21.5 m[IU]/mL — ABNORMAL HIGH (ref 2.6–18.5)

## 2021-09-19 LAB — JAK2 GENOTYPR

## 2021-09-22 ENCOUNTER — Other Ambulatory Visit: Payer: Self-pay | Admitting: Family Medicine

## 2021-09-22 DIAGNOSIS — E1140 Type 2 diabetes mellitus with diabetic neuropathy, unspecified: Secondary | ICD-10-CM

## 2021-09-22 NOTE — Telephone Encounter (Signed)
Requested Prescriptions  Pending Prescriptions Disp Refills  . metFORMIN (GLUCOPHAGE) 1000 MG tablet [Pharmacy Med Name: metFORMIN HCl 1000 MG Oral Tablet] 180 tablet 1    Sig: TAKE 1 TABLET BY MOUTH  TWICE DAILY WITH MEALS     Endocrinology:  Diabetes - Biguanides Passed - 09/22/2021 12:52 AM      Passed - Cr in normal range and within 360 days    Creat  Date Value Ref Range Status  07/11/2021 0.88 0.70 - 1.35 mg/dL Final         Passed - HBA1C is between 0 and 7.9 and within 180 days    Hgb A1c MFr Bld  Date Value Ref Range Status  07/11/2021 6.0 (H) <5.7 % of total Hgb Final    Comment:    For someone without known diabetes, a hemoglobin  A1c value between 5.7% and 6.4% is consistent with prediabetes and should be confirmed with a  follow-up test. . For someone with known diabetes, a value <7% indicates that their diabetes is well controlled. A1c targets should be individualized based on duration of diabetes, age, comorbid conditions, and other considerations. . This assay result is consistent with an increased risk of diabetes. . Currently, no consensus exists regarding use of hemoglobin A1c for diagnosis of diabetes for children. .          Passed - eGFR in normal range and within 360 days    GFR, Est African American  Date Value Ref Range Status  11/10/2020 111 > OR = 60 mL/min/1.41m Final   GFR, Est Non African American  Date Value Ref Range Status  11/10/2020 96 > OR = 60 mL/min/1.778mFinal         Passed - Valid encounter within last 6 months    Recent Outpatient Visits          1 month ago Annual physical exam   [place]   7 months ago [facility] 2 [facility], [facility]   9 months ago Essential (primary) hypertension   [place]   10 months ago Hyperlipidemia associated with type 2  diabetes mellitus ([facility]  [facility]   1 year ago Annual physical exam   [place]      Future Appointments            In 4 months [facility]Dev[facility]oughty[facility] months  [facility]

## 2021-09-26 DIAGNOSIS — M13862 Other specified arthritis, left knee: Secondary | ICD-10-CM | POA: Diagnosis not present

## 2021-09-26 DIAGNOSIS — M25562 Pain in left knee: Secondary | ICD-10-CM | POA: Diagnosis not present

## 2021-10-01 ENCOUNTER — Other Ambulatory Visit: Payer: Self-pay | Admitting: Urology

## 2021-10-01 NOTE — Progress Notes (Signed)
[place]  Telephone:([phone]) [phone] [phone]) [phone]  ID: Dale [name]. OB: 02/15/1961  MR#: [id number]  OVA#:[id number]  Patient Care Team: Olin Hauser, DO as PCP - General (Family Medicine) Curley Spice, Virl Diamond, RPH-CPP as Pharmacist  CHIEF COMPLAINT: Secondary polycythemia.  INTERVAL HISTORY: Patient returns to clinic today for further evaluation and discussion of his laboratory results.  He continues to feel well and remains asymptomatic. He has no neurologic complaints.  He denies any recent fevers or illnesses.  He has a good appetite and denies weight loss.  He has no chest pain, shortness of breath, cough, or hemoptysis.  He denies any nausea, vomiting, constipation, or diarrhea.  He has no urinary complaints.  Patient offers no specific complaints today.  REVIEW OF SYSTEMS:   Review of Systems  Constitutional: Negative.  Negative for fever, malaise/fatigue and weight loss.  Respiratory: Negative.  Negative for cough, hemoptysis and shortness of breath.   Cardiovascular: Negative.  Negative for chest pain and leg swelling.  Gastrointestinal: Negative.  Negative for abdominal pain.  Genitourinary: Negative.  Negative for dysuria.  Musculoskeletal: Negative.  Negative for back pain.  Skin: Negative.  Negative for rash.  Neurological: Negative.  Negative for dizziness, focal weakness, weakness and headaches.  Psychiatric/Behavioral:  The patient is not nervous/anxious.    As per HPI. Otherwise, a complete review of systems is negative.  PAST MEDICAL HISTORY: Past Medical History:  Diagnosis Date   ADHD (attention deficit hyperactivity disorder)    Anxiety    Coronary artery disease    Coronary artery disease    Coronary atherosclerosis    Depression    GERD (gastroesophageal reflux disease)    Glaucoma    Headache    migrane   Heart disease    Hyperlipidemia    Hypertension    Hypertriglyceridemia    Hypogonadism in male     MVP (mitral valve prolapse)    Narcolepsy and cataplexy    Non-alcoholic fatty liver disease     PAST SURGICAL HISTORY: Past Surgical History:  Procedure Laterality Date   COLONOSCOPY WITH PROPOFOL N/A 11/18/2018   Procedure: COLONOSCOPY WITH PROPOFOL;  Surgeon: Manya Silvas, MD;  Location: [facility] ENDOSCOPY;  Service: Endoscopy;  Laterality: N/A;   ESOPHAGOGASTRODUODENOSCOPY (EGD) WITH PROPOFOL N/A 11/18/2018   Procedure: ESOPHAGOGASTRODUODENOSCOPY (EGD) WITH PROPOFOL;  Surgeon: Manya Silvas, MD;  Location: [facility] ENDOSCOPY;  Service: Endoscopy;  Laterality: N/A;   pain pump implant and removal Bilateral    Dec. 21 2019   SPINE SURGERY     Multiple surgeries, initial 2001, thoracic disc repair and recurrent revisions.   THROAT SURGERY      FAMILY HISTORY: Family History  Problem Relation Age of Onset   Depression Mother    Anxiety disorder Mother    Diabetes Mother    Heart failure Mother    Heart attack Father    Diabetes Sister    Obesity Sister    Drug abuse Brother    Alcohol abuse Brother    Depression Sister    Anxiety disorder Sister    Drug abuse Sister    Anxiety disorder Sister    Seizures Sister    Diabetes Sister    Depression Sister     ADVANCED DIRECTIVES (Y/N):  N  HEALTH MAINTENANCE: Social History   Tobacco Use   Smoking status: Former    Types: Cigarettes    Start date: 08/29/1979    Quit date: 11/04/2010    Years since quitting:  10.9   Smokeless tobacco: Former    Quit date: 11/19/2010   Tobacco comments:    1 to 1.5 packs per day  Vaping Use   Vaping Use: Never used  Substance Use Topics   Alcohol use: No    Alcohol/week: 0.0 standard drinks   Drug use: No     Colonoscopy:  PAP:  Bone density:  Lipid panel:  Allergies  Allergen Reactions   Benzoin Other (See Comments)    blisters   Doxycycline Hyclate Nausea Only   Morphine Other (See Comments)   Penicillins Other (See Comments)    Current Outpatient Medications   Medication Sig Dispense Refill   acetaminophen (TYLENOL) 500 MG tablet Take 500 mg by mouth daily as needed.     Alcohol Swabs (B-D SINGLE USE SWABS REGULAR) PADS Check blood sugar 2 times daily. 200 each 5   ALPRAZolam (XANAX) 1 MG tablet Take 1 mg by mouth 3 (three) times daily as needed. 90 tablet 0   aspirin EC 81 MG tablet Take 81 mg by mouth daily.     atorvastatin (LIPITOR) 10 MG tablet TAKE 1 TABLET BY MOUTH  DAILY AT 2 PM 90 tablet 0   b complex vitamins capsule Take by mouth.     Blood Glucose Calibration (TRUE METRIX LEVEL 1) Low SOLN Check blood sugar 2 times daily 1 each 5   Blood Glucose Monitoring Suppl (TRUE METRIX AIR GLUCOSE METER) w/Device KIT Check blood sugar 2 times daily 1 kit 0   buPROPion (WELLBUTRIN XL) 300 MG 24 hr tablet Take 1 tablet (300 mg total) by mouth daily. 90 tablet 3   CINNAMON PO Take by mouth.     Coenzyme Q10 100 MG TABS Take 100 mg by mouth daily.     desoximetasone (TOPICORT) 0.25 % cream Apply 1 application topically 2 (two) times daily as needed. 15 g 2   diclofenac sodium (VOLTAREN) 1 % GEL Apply topically.     dicyclomine (BENTYL) 20 MG tablet   1   DULoxetine (CYMBALTA) 60 MG capsule Take 1 capsule (60 mg total) by mouth Two (2) times a day. Morning and early afternoon     gabapentin (NEURONTIN) 300 MG capsule Take 3 capsules by mouth 3 (three) times daily.     hydrochlorothiazide (HYDRODIURIL) 25 MG tablet Take 1 tablet (25 mg total) by mouth daily. 90 tablet 3   lisinopril (ZESTRIL) 10 MG tablet Take 1 tablet by mouth daily.     metFORMIN (GLUCOPHAGE) 1000 MG tablet TAKE 1 TABLET BY MOUTH  TWICE DAILY WITH MEALS 180 tablet 1   Multiple Vitamin (MULTIVITAMIN) tablet Take 1 tablet by mouth daily.     NEEDLE, DISP, 18 G (B-D DISP NEEDLE TW 18GX1") 18G X 1" MISC For use to draw up medication into the syringe 25 each 0   NEEDLE, DISP, 21 G (BD SAFETYGLIDE SHIELDED NEEDLE) 21G X 1-1/2" MISC For use of administering medication in the IM location 25  each 0   Omega-3 Fatty Acids (FISH OIL) 1200 MG CAPS Take 1 capsule (1,200 mg total) by mouth daily. (Patient taking differently: Take 1,200 mg by mouth 2 (two) times daily.)     OZEMPIC, 0.25 OR 0.5 MG/DOSE, 2 MG/1.5ML SOPN Inject 0.25 mg into the skin once a week. 1.5 mL 0   pantoprazole (PROTONIX) 40 MG tablet Take 40 mg by mouth in the morning.  0   polyethylene glycol (MIRALAX / GLYCOLAX) 17 g packet Take 17 g by mouth daily  as needed.     propranolol (INDERAL) 40 MG tablet TAKE 1 TABLET BY MOUTH THREE TIMES A DAY 270 tablet 0   Syringe, Disposable, 3 ML MISC Use syringes as directed by physician 25 each 0   tamsulosin (FLOMAX) 0.4 MG CAPS capsule TAKE 2 CAPSULES BY MOUTH EVERY DAY 60 capsule 0   testosterone cypionate (DEPOTESTOSTERONE CYPIONATE) 200 MG/ML injection Inject 1 mL (200 mg total) into the muscle every 14 (fourteen) days. 2 mL 3   tiZANidine (ZANAFLEX) 4 MG tablet 1 tablet twice daily, May take an additional 1.5 tablet at bedtime     traMADol (ULTRAM) 50 MG tablet Take 50 mg by mouth every 6 (six) hours as needed.     TRUE METRIX BLOOD GLUCOSE TEST test strip Check blood sugar 2 times a day 200 each 5   TRUEplus Lancets 33G MISC Check blood sugar 2 times a day 200 each 5   No current facility-administered medications for this visit.    OBJECTIVE: Vitals:   10/04/21 1047  BP: 100/68  Pulse: 73  Resp: 16  Temp: (!) 97.2 F (36.2 C)  SpO2: 98%     Body mass index is 29.85 kg/m.    ECOG FS:0 - Asymptomatic  General: Well-developed, well-nourished, no acute distress. Eyes: Pink conjunctiva, anicteric sclera. HEENT: Normocephalic, moist mucous membranes. Lungs: No audible wheezing or coughing. Heart: Regular rate and rhythm. Abdomen: Soft, nontender, no obvious distention. Musculoskeletal: No edema, cyanosis, or clubbing. Neuro: Alert, answering all questions appropriately. Cranial nerves grossly intact. Skin: No rashes or petechiae noted. Psych: Normal  affect.   LAB RESULTS:  Lab Results  Component Value Date   NA 137 07/11/2021   K 3.9 07/11/2021   CL 97 (L) 07/11/2021   CO2 31 07/11/2021   GLUCOSE 100 (H) 07/11/2021   BUN 18 07/11/2021   CREATININE 0.88 07/11/2021   CALCIUM 10.1 07/11/2021   PROT 6.7 07/11/2021   ALBUMIN 4.6 10/10/2017   AST 16 07/11/2021   ALT 14 07/11/2021   ALKPHOS 71 10/10/2017   BILITOT 0.5 07/11/2021   GFRNONAA 96 11/10/2020   GFRAA 111 11/10/2020    Lab Results  Component Value Date   WBC 8.4 09/12/2021   NEUTROABS 6,206 07/13/2019   HGB 15.8 09/12/2021   HCT 48.2 09/12/2021   MCV 87.6 09/12/2021   PLT 255 09/12/2021     STUDIES: No results found.  ASSESSMENT: Secondary polycythemia.  PLAN:    Secondary polycythemia: Patient's transiently elevated hematocrit likely was secondary to his testosterone replacement, but has now returned to normal limits with a hematocrit of 48.2 and hemoglobin of 15.8.  All of his other laboratory work including JAK2 mutation is either negative or within normal limits.  He does not require phlebotomy at this time, but we did discuss the possibility of donating blood in the community setting.  No further intervention is needed.  No follow-up has been scheduled.  Iron deficiency: Patient had normal colonoscopy in 2020.  Possibly related to depletion of iron stores from polycythemia.  No intervention is needed.    I spent a total of 20 minutes reviewing chart data, face-to-face evaluation with the patient, counseling and coordination of care as detailed above.   Patient expressed understanding and was in agreement with this plan. He also understands that He can call clinic at any time with any questions, concerns, or complaints.     Lloyd Huger, MD   10/04/2021 2:07 PM

## 2021-10-03 DIAGNOSIS — E1140 Type 2 diabetes mellitus with diabetic neuropathy, unspecified: Secondary | ICD-10-CM | POA: Diagnosis not present

## 2021-10-03 DIAGNOSIS — I10 Essential (primary) hypertension: Secondary | ICD-10-CM | POA: Diagnosis not present

## 2021-10-04 ENCOUNTER — Inpatient Hospital Stay: Payer: Medicare Other | Attending: Oncology | Admitting: Oncology

## 2021-10-04 ENCOUNTER — Other Ambulatory Visit: Payer: Self-pay

## 2021-10-04 VITALS — BP 100/68 | HR 73 | Temp 97.2°F | Resp 16 | Wt 220.1 lb

## 2021-10-04 DIAGNOSIS — Z87891 Personal history of nicotine dependence: Secondary | ICD-10-CM | POA: Diagnosis not present

## 2021-10-04 DIAGNOSIS — Z7982 Long term (current) use of aspirin: Secondary | ICD-10-CM | POA: Insufficient documentation

## 2021-10-04 DIAGNOSIS — D751 Secondary polycythemia: Secondary | ICD-10-CM | POA: Diagnosis not present

## 2021-10-04 DIAGNOSIS — Z7984 Long term (current) use of oral hypoglycemic drugs: Secondary | ICD-10-CM | POA: Diagnosis not present

## 2021-10-04 DIAGNOSIS — Z79899 Other long term (current) drug therapy: Secondary | ICD-10-CM | POA: Insufficient documentation

## 2021-10-04 NOTE — Progress Notes (Signed)
Pt wanting to discuss lab results. Pt reports recent injury to left knee and is being treated by orthopedics.

## 2021-10-26 ENCOUNTER — Other Ambulatory Visit: Payer: Self-pay | Admitting: Internal Medicine

## 2021-10-26 DIAGNOSIS — E1169 Type 2 diabetes mellitus with other specified complication: Secondary | ICD-10-CM

## 2021-10-26 NOTE — Telephone Encounter (Signed)
Requested Prescriptions  Pending Prescriptions Disp Refills   atorvastatin (LIPITOR) 10 MG tablet [Pharmacy Med Name: Atorvastatin Calcium 10 MG Oral Tablet] 90 tablet 3    Sig: TAKE 1 TABLET BY MOUTH  DAILY AT 2 PM     Cardiovascular:  Antilipid - Statins Failed - 10/26/2021 10:51 PM      Failed - HDL in normal range and within 360 days    HDL  Date Value Ref Range Status  07/11/2021 30 (L) > OR = 40 mg/dL Final  06/26/2015 30 (L) >39 mg/dL Final    Comment:    According to ATP-III Guidelines, HDL-C >59 mg/dL is considered a negative risk factor for CHD.          Failed - Triglycerides in normal range and within 360 days    Triglycerides  Date Value Ref Range Status  07/11/2021 237 (H) <150 mg/dL Final    Comment:    . If a non-fasting specimen was collected, consider repeat triglyceride testing on a fasting specimen if clinically indicated.  Yates Decamp et al. J. of Clin. Lipidol. 2831;5:176-160. Marland Kitchen          Passed - Total Cholesterol in normal range and within 360 days    Cholesterol, Total  Date Value Ref Range Status  06/26/2015 104 100 - 199 mg/dL Final   Cholesterol  Date Value Ref Range Status  07/11/2021 114 <200 mg/dL Final         Passed - LDL in normal range and within 360 days    LDL Cholesterol (Calc)  Date Value Ref Range Status  07/11/2021 54 mg/dL (calc) Final    Comment:    Reference range: <100 . Desirable range <100 mg/dL for primary prevention;   <70 mg/dL for patients with CHD or diabetic patients  with > or = 2 CHD risk factors. Marland Kitchen LDL-C is now calculated using the Martin-Hopkins  calculation, which is a validated novel method providing  better accuracy than the Friedewald equation in the  estimation of LDL-C.  Cresenciano Genre et al. Annamaria Helling. 7371;062(69): 2061-2068  (http://education.[facility].com/faq/FAQ164)          Passed - Patient is not pregnant      Passed - Valid encounter within last 12 months    Recent Outpatient Visits           2 months ago Annual physical exam   [place], DO   8 months ago Type 2 diabetes, controlled, with neuropathy [facility])   [facility] Olin Hauser, DO   11 months ago Essential (primary) hypertension   [place], DO   11 months ago Hyperlipidemia associated with type 2 diabetes mellitus [facility])   [facility] Olin Hauser, DO   1 year ago Annual physical exam   [place], DO      Future Appointments            In 3 months [place], Devonne Doughty, [facility], [place]   In 8 months  [facility], [place]   In 10 months [place], Ronda Fairly, [facility]

## 2021-10-29 ENCOUNTER — Other Ambulatory Visit: Payer: Self-pay | Admitting: Urology

## 2021-11-02 DIAGNOSIS — Z885 Allergy status to narcotic agent status: Secondary | ICD-10-CM | POA: Diagnosis not present

## 2021-11-02 DIAGNOSIS — S8991XA Unspecified injury of right lower leg, initial encounter: Secondary | ICD-10-CM | POA: Diagnosis not present

## 2021-11-02 DIAGNOSIS — F32A Depression, unspecified: Secondary | ICD-10-CM | POA: Diagnosis not present

## 2021-11-02 DIAGNOSIS — Z7984 Long term (current) use of oral hypoglycemic drugs: Secondary | ICD-10-CM | POA: Diagnosis not present

## 2021-11-02 DIAGNOSIS — M2392 Unspecified internal derangement of left knee: Secondary | ICD-10-CM | POA: Diagnosis not present

## 2021-11-02 DIAGNOSIS — Z87891 Personal history of nicotine dependence: Secondary | ICD-10-CM | POA: Diagnosis not present

## 2021-11-02 DIAGNOSIS — M25462 Effusion, left knee: Secondary | ICD-10-CM | POA: Diagnosis not present

## 2021-11-02 DIAGNOSIS — I10 Essential (primary) hypertension: Secondary | ICD-10-CM | POA: Diagnosis not present

## 2021-11-02 DIAGNOSIS — Z880 Allergy status to penicillin: Secondary | ICD-10-CM | POA: Diagnosis not present

## 2021-11-02 DIAGNOSIS — S8992XA Unspecified injury of left lower leg, initial encounter: Secondary | ICD-10-CM | POA: Diagnosis not present

## 2021-11-02 DIAGNOSIS — G5600 Carpal tunnel syndrome, unspecified upper limb: Secondary | ICD-10-CM | POA: Diagnosis not present

## 2021-11-02 DIAGNOSIS — M170 Bilateral primary osteoarthritis of knee: Secondary | ICD-10-CM | POA: Diagnosis not present

## 2021-11-02 DIAGNOSIS — E119 Type 2 diabetes mellitus without complications: Secondary | ICD-10-CM | POA: Diagnosis not present

## 2021-11-08 DIAGNOSIS — M1712 Unilateral primary osteoarthritis, left knee: Secondary | ICD-10-CM | POA: Diagnosis not present

## 2021-11-08 DIAGNOSIS — M2392 Unspecified internal derangement of left knee: Secondary | ICD-10-CM | POA: Diagnosis not present

## 2021-11-08 DIAGNOSIS — M948X8 Other specified disorders of cartilage, other site: Secondary | ICD-10-CM | POA: Diagnosis not present

## 2021-11-08 DIAGNOSIS — S83232A Complex tear of medial meniscus, current injury, left knee, initial encounter: Secondary | ICD-10-CM | POA: Diagnosis not present

## 2021-11-09 ENCOUNTER — Ambulatory Visit (INDEPENDENT_AMBULATORY_CARE_PROVIDER_SITE_OTHER): Payer: Medicare Other | Admitting: Pharmacist

## 2021-11-09 DIAGNOSIS — I10 Essential (primary) hypertension: Secondary | ICD-10-CM

## 2021-11-09 DIAGNOSIS — E1140 Type 2 diabetes mellitus with diabetic neuropathy, unspecified: Secondary | ICD-10-CM

## 2021-11-09 NOTE — Chronic Care Management (AMB) (Signed)
Chronic Care Management CCM Pharmacy Note  11/09/2021 Name:  Dale [name]. MRN:  [id number] DOB:  1961-02-28   Subjective: Dale [name]. is an 61 y.o. year old male who is a primary patient of Smitty Cords, DO.  The CCM team was consulted for assistance with disease management and care coordination needs.    Engaged with patient by telephone for follow up visit for pharmacy case management and/or care coordination services.   Objective:  Medications Reviewed Today     Reviewed by Manuela Neptune, RPH-CPP (Pharmacist) on 11/09/21 at 1205  Med List Status: <None>   Medication Order Taking? Sig Documenting Provider Last Dose Status Informant  acetaminophen (TYLENOL) 500 MG tablet [id number]  Take 500 mg by mouth daily as needed. [provider]  Active   ALPRAZolam Prudy Feeler) 1 MG tablet [id number]  Take 1 mg by mouth 3 (three) times daily as needed. Jacquelin Hawking, MD  Active   aspirin EC 81 MG tablet [id number]  Take 81 mg by mouth daily. [provider]  Active   atorvastatin (LIPITOR) 10 MG tablet [id number]  TAKE 1 TABLET BY MOUTH  DAILY AT 2 PM Karamalegos, Netta Neat, DO  Active   b complex vitamins capsule [id number]  Take by mouth. [provider]  Active   Blood Glucose Monitoring Suppl (TRUE METRIX AIR GLUCOSE METER) w/Device KIT [id number]  Check blood sugar 2 times daily Karamalegos, Netta Neat, DO  Active   buPROPion (WELLBUTRIN XL) 300 MG 24 hr tablet [id number]  Take 1 tablet (300 mg total) by mouth daily. Smitty Cords, DO  Active   CINNAMON PO [id number]  Take by mouth. [provider]  Active   Coenzyme Q10 100 MG TABS [id number]  Take 100 mg by mouth daily. [provider]  Active   desoximetasone (TOPICORT) 0.25 % cream [id number]  Apply 1 application topically 2 (two) times daily as needed. Smitty Cords, DO  Active   diclofenac sodium (VOLTAREN) 1 % GEL [id number]  Apply topically.  [provider]  Active   dicyclomine (BENTYL) 20 MG tablet [id number]   [provider]  Active            Med Note De Nurse, Vee Bahe A   Mon Jun 12, 2020  9:15 AM) As needed  DULoxetine (CYMBALTA) 60 MG capsule [id number]  Take 1 capsule (60 mg total) by mouth Two (2) times a day. Morning and early afternoon [provider]  Active   gabapentin (NEURONTIN) 300 MG capsule [id number]  Take 3 capsules by mouth 3 (three) times daily. [provider]  Active            Med Note De Nurse, Denelda Akerley A   Wed Nov 08, 2020  4:56 PM) Reports taking 900 mg in the morning and at noon, and take 1200 mg (4 capsules) nightly as directed by [facility]  hydrochlorothiazide (HYDRODIURIL) 25 MG tablet [id number] Yes Take 1 tablet (25 mg total) by mouth daily. Smitty Cords, DO Taking Active   lisinopril (ZESTRIL) 10 MG tablet [id number] Yes Take 1 tablet by mouth daily. [provider] Taking Active   metFORMIN (GLUCOPHAGE) 1000 MG tablet [id number] Yes TAKE 1 TABLET BY MOUTH  TWICE DAILY WITH MEALS Karamalegos, Netta Neat, DO Taking Active   Multiple Vitamin (MULTIVITAMIN) tablet [id number]  Take 1 tablet by mouth daily. [provider]  Active   NEEDLE, DISP, 18 G (B-D DISP NEEDLE TW 18GX1") 18G  X 1" MISC [id number]  For use to draw up medication into the syringe Stoioff, Scott C, MD  Active   NEEDLE, DISP, 21 G (BD SAFETYGLIDE SHIELDED NEEDLE) 21G X 1-1/2" [place] [id number]  For use of administering medication in the IM location Stoioff, Ronda Fairly, MD  Active   Omega-3 Fatty Acids (FISH OIL) 1200 MG CAPS [id number]  Take 1 capsule (1,200 mg total) by mouth daily.  Patient taking differently: Take 1,200 mg by mouth 2 (two) times daily.   Karamalegos, Devonne Doughty, DO  Active   pantoprazole (PROTONIX) 40 MG tablet [id number]  Take 40 mg by mouth in the morning. [provider]  Active   polyethylene glycol (MIRALAX / GLYCOLAX) 17 g packet  [id number]  Take 17 g by mouth daily as needed. [provider]  Active   propranolol (INDERAL) 40 MG tablet [id number] Yes TAKE 1 TABLET BY MOUTH THREE TIMES A DAY Karamalegos, Devonne Doughty, DO Taking Active   Semaglutide, 1 MG/DOSE, (OZEMPIC, 1 MG/DOSE,) 2 MG/1.5ML SOPN [id number] Yes Inject into the skin. Inject 1 mg subcutaneously every 7 (seven) days [provider] Taking Active   tamsulosin (FLOMAX) 0.4 MG CAPS capsule [id number]  TAKE 2 CAPSULES BY MOUTH EVERY DAY Stoioff, Scott C, MD  Active   testosterone cypionate (DEPOTESTOSTERONE CYPIONATE) 200 MG/ML injection [id number]  Inject 1 mL (200 mg total) into the muscle every 14 (fourteen) days. Abbie Sons, MD  Active   tiZANidine (ZANAFLEX) 4 MG tablet [id number]  1 tablet twice daily, May take an additional 1.5 tablet at bedtime [provider]  Active   traMADol (ULTRAM) 50 MG tablet [id number]  Take 50 mg by mouth every 6 (six) hours as needed. [provider]  Active   TRUE METRIX BLOOD GLUCOSE TEST test strip [id number]  Check blood sugar 2 times a day Olin Hauser, DO  Active             Pertinent Labs:  Lab Results  Component Value Date   HGBA1C 6.0 (H) 07/11/2021   Lab Results  Component Value Date   CHOL 114 07/11/2021   HDL 30 (L) 07/11/2021   LDLCALC 54 07/11/2021   TRIG 237 (H) 07/11/2021   CHOLHDL 3.8 07/11/2021   Lab Results  Component Value Date   CREATININE 0.88 07/11/2021   BUN 18 07/11/2021   NA 137 07/11/2021   K 3.9 07/11/2021   CL 97 (L) 07/11/2021   CO2 31 07/11/2021    SDOH:  (Social Determinants of Health) assessments and interventions performed:    [place]  Review of patient past medical history, allergies, medications, health status, including review of consultants reports, laboratory and other test data, was performed as part of comprehensive evaluation and provision of chronic care management services.   Care Plan : General Pharmacy  (Adult)  Updates made by [name], RPH-CPP since 11/09/2021 12:00 AM     Problem: Disease Progression      Long-Range Goal: Disease Progression Prevented or Minimized   Start Date: 11/13/2020  Expected End Date: 02/11/2021  Recent Progress: On track  Priority: High  Note:   Current Barriers:  Financial Barriers in complicated patient with multiple medical conditions including T2DM, HTN, peripheral neuropathy, depression, GERD, tremors and chronic pain; patient has [employer] insurance Reports currently in coverage gap of Part D plan coverage Frustration with pill burden  Pharmacist Clinical Goal(s):  Over the next 90 days, patient will maintain control  of blood sugar as evidenced by A1C >6.5% through collaboration with PharmD and provider.   Interventions: 1:1 collaboration with Olin Hauser, DO regarding development and update of comprehensive plan of care as evidenced by provider attestation and co-signature Inter-disciplinary care team collaboration (see longitudinal plan of care) Perform chart review [facility] Visit with [facility] on 12/30. Provider advised: - Home exercise program provided - Patient will use OTC medication for swelling/pain control - Ice affected area for 15-20 minutes every hour as needed - Corticosteroid injection administered to the right knee joint  - Obtain further imaging, MRI of left knee to assess for meniscus tear - Follow-up 2-3 days after the MRI scan to discuss results and further treatment options in-person or via phone visit Office Visit with [facility] on 12/1 for secondary polycythemia Confirms completed MRI yesterday and will call [place] office to schedule follow up appointment  Type 2 Diabetes: Controlled; current treatment: metformin 1000 mg twice daily Ozempic 1 mg weekly  Reports restarted Ozempic 1 mg weekly strength in New Year (out of coverage gap) Counsel  on next time if has been out of medication, can taper back up to avoid side effects.  Denies GI side effects at this time; counsel on strategies in case has GI upset Reports recent morning fasting blood sugars ranging 75-89 Denies symptoms of hypoglycemia Discuss impact of dietary choices and exercise on blood sugar control Reports spreading meals throughout the day and trying to limit carbohydrates Reports working on weight loss; current home weight 210 lbs Exercise: reports is more active now that he's working (getting out of the house) - working as bus Geophysicist/field seismologist for [employer] to exercise limited by knee pain    Hypertension: Patient currently taking: Lisinopril 10 mg daily Propranolol IR $RemoveBefore'40mg'[id number]$  three times daily HCTZ 25 mg daily Reports recent home BP readings: Today: 114/82, HR 64 1/5: 121/85, HR 64 Denies signs of hypotension Discuss continuing to take positional changes slowly Encourage patient to continue home BP monitoring, keep log of results, bring record to medical appointments and call providers for readings outside of established parameters, dizziness or new symptoms   Patient Goals/Self-Care Activities Over the next 90 days, patient will:  - take medications as prescribed - check glucose, document, and provide at future appointments - check blood pressure, document, and provide at future appointments  Follow Up Plan: Telephone follow up appointment with care management team member scheduled for: 01/02/2022 at 11:15 am      Wallace Cullens, PharmD, [place], [place] [phone]

## 2021-11-09 NOTE — Patient Instructions (Signed)
Visit Information  Thank you for taking time to visit with me today. Please don't hesitate to contact me if I can be of assistance to you before our next scheduled telephone appointment.  Following are the goals we discussed today:   Goals Addressed             This Visit's Progress    Pharmacy Goals       Our goal A1c is less than 7%. This corresponds with fasting sugars less than 130 and 2 hour after meal sugars less than 180. Please check your blood sugar and keep a log of the results  Please check your blood pressure, keep record of results and bring this record with you to medical appointments.  Our goal bad cholesterol, or LDL, is less than 70 . This is why it is important to continue taking your atorvastatin  Feel free to call me with any questions or concerns. I look forward to our next call!  Wallace Cullens, PharmD, [place], CPP Clinical Pharmacist [facility] [phone]          Our next appointment is by telephone on 01/02/2022 at 11:15 am  Please call the care guide team at [phone] if you need to cancel or reschedule your appointment.    The patient verbalized understanding of instructions, educational materials, and care plan provided today and declined offer to receive copy of patient instructions, educational materials, and care plan.

## 2021-11-14 DIAGNOSIS — S91104A Unspecified open wound of right lesser toe(s) without damage to nail, initial encounter: Secondary | ICD-10-CM | POA: Diagnosis not present

## 2021-11-14 DIAGNOSIS — S83232D Complex tear of medial meniscus, current injury, left knee, subsequent encounter: Secondary | ICD-10-CM | POA: Diagnosis not present

## 2021-11-22 DIAGNOSIS — M170 Bilateral primary osteoarthritis of knee: Secondary | ICD-10-CM | POA: Diagnosis not present

## 2021-11-22 DIAGNOSIS — S83232D Complex tear of medial meniscus, current injury, left knee, subsequent encounter: Secondary | ICD-10-CM | POA: Diagnosis not present

## 2021-11-23 ENCOUNTER — Ambulatory Visit: Payer: Self-pay | Admitting: *Deleted

## 2021-11-23 NOTE — Telephone Encounter (Signed)
I returned pt's call.   He called in earlier today c/o having a stuffy nose and slight cough that started last night.  Requesting to be tested for Covid. Also 4-6 wks ago he injured his right toe now it's red and swollen.   He has diabetes.  No appts available.   Reason for Disposition  [1] Toe injury AND [2] bad limp or can't wear shoes/sandals  Answer Assessment - Initial Assessment Questions 1. MECHANISM: "How did the injury happen?"      4-6 weeks ago.   I went to bed and it was fine when I woke up the next morning it had blood on it.   It is red and swollen.   I've been on an antibiotic for 8 days but it hasn't helped the toe prescribed by [facility] orthopedics.  It's Levofloxin 500 mg 2. ONSET: "When did the injury happen?" (Minutes or hours ago)      4-6 weeks ago 3. LOCATION: "What part of the toe is injured?" "Is the nail damaged?"      The right toe next to the large toe.  Nail looks fine 4. APPEARANCE of TOE INJURY: "What does the injury look like?"      Red and swollen    5. SEVERITY: "Can you use the foot normally?" "Can you walk?"      *No Answer* 6. SIZE: For cuts, bruises, or swelling, ask: "How large is it?" (e.g., inches or centimeters;  entire toe)      *No Answer* 7. PAIN: "Is there pain?" If Yes, ask: "How bad is the pain?"   (e.g., Scale 1-10; or mild, moderate, severe)     *No Answer* 8. TETANUS: For any breaks in the skin, ask: "When was the last tetanus booster?"     *No Answer* 9. DIABETES: "Do you have a history of diabetes or poor circulation in the feet?"     *No Answer* 10. OTHER SYMPTOMS: "Do you have any other symptoms?"        *No Answer* 11. PREGNANCY: "Is there any chance you are pregnant?" "When was your last menstrual period?"       *No Answer*  Protocols used: Toe Injury-A-AH

## 2021-11-23 NOTE — Telephone Encounter (Signed)
°  Chief Complaint: Has positive home test for Covid.  C/o stuffy nose and slight cough started this morning.    Wanting to be tested again for confirmation.    Also has diabetes.   He injured his toe 4-6 wks ago, doesn't know how it happened, but it's red and swollen even after being on Levofloxin for 8 days. Symptoms: Right toe next to big toe is red and swollen.  Has neuropathy so unable to feel anything in feet.  Frequency: 4-6 weeks. Pertinent Negatives: Patient denies pain in toe due to neuropathy Disposition: [] ED /[] Urgent Care (no appt availability in office) / [x] Appointment(In office/virtual)/ []  [place] [facility]/ [] Home Care/ [] Refused Recommended Disposition /[] [place] [facility]/ []  Follow-up with PCP Additional Notes: No appts available.   Attempted to call into [place] but no answer.   Sending a high priority note for them to call and advise pt of possibly being seen today.   Offered a virtual visit since Covid positive however he wants to come in to be seen due to his toe.

## 2021-11-23 NOTE — Telephone Encounter (Signed)
I don't believe this is something that could be easily worked in - covid positive sick plus toe problem for weeks, the toe did not respond to oral antibiotic levaquin, may require further treatment.  I would recommend urgent care in person evaluation  [name], [facility] 11/23/2021, 10:24 AM

## 2021-12-04 DIAGNOSIS — I10 Essential (primary) hypertension: Secondary | ICD-10-CM | POA: Diagnosis not present

## 2021-12-04 DIAGNOSIS — E1140 Type 2 diabetes mellitus with diabetic neuropathy, unspecified: Secondary | ICD-10-CM | POA: Diagnosis not present

## 2021-12-05 DIAGNOSIS — E1169 Type 2 diabetes mellitus with other specified complication: Secondary | ICD-10-CM | POA: Diagnosis not present

## 2021-12-05 DIAGNOSIS — Q666 Other congenital valgus deformities of feet: Secondary | ICD-10-CM | POA: Diagnosis not present

## 2021-12-05 DIAGNOSIS — S92524A Nondisplaced fracture of medial phalanx of right lesser toe(s), initial encounter for closed fracture: Secondary | ICD-10-CM | POA: Diagnosis not present

## 2021-12-05 DIAGNOSIS — M19071 Primary osteoarthritis, right ankle and foot: Secondary | ICD-10-CM | POA: Diagnosis not present

## 2021-12-14 DIAGNOSIS — M961 Postlaminectomy syndrome, not elsewhere classified: Secondary | ICD-10-CM | POA: Diagnosis not present

## 2021-12-14 DIAGNOSIS — G6289 Other specified polyneuropathies: Secondary | ICD-10-CM | POA: Diagnosis not present

## 2021-12-31 ENCOUNTER — Other Ambulatory Visit: Payer: Self-pay | Admitting: Urology

## 2022-01-02 ENCOUNTER — Ambulatory Visit (INDEPENDENT_AMBULATORY_CARE_PROVIDER_SITE_OTHER): Payer: Medicare Other | Admitting: Pharmacist

## 2022-01-02 DIAGNOSIS — E1140 Type 2 diabetes mellitus with diabetic neuropathy, unspecified: Secondary | ICD-10-CM

## 2022-01-02 DIAGNOSIS — I10 Essential (primary) hypertension: Secondary | ICD-10-CM

## 2022-01-02 MED ORDER — TRUE METRIX BLOOD GLUCOSE TEST VI STRP: ORAL_STRIP | 3 refills | Status: DC

## 2022-01-02 MED ORDER — HYDROCHLOROTHIAZIDE 12.5 MG PO TABS: 12.5000 mg | ORAL_TABLET | Freq: Every day | ORAL | 0 refills | Status: DC

## 2022-01-02 NOTE — Chronic Care Management (AMB) (Signed)
Chronic Care Management CCM Pharmacy Note  01/02/2022 Name:  Dale [name]. MRN:  [id number] DOB:  [birth year]   Subjective: Dale [name]. is an 61 y.o. year old male who is a primary patient of Olin Hauser, DO.  The CCM team was consulted for assistance with disease management and care coordination needs.    Engaged with patient by telephone for follow up visit for pharmacy case management and/or care coordination services.   Objective:  Medications Reviewed Today     Reviewed by [name], RPH-CPP (Pharmacist) on 11/09/21 at 1205  Med List Status: <None>   Medication Order Taking? Sig Documenting Provider Last Dose Status Informant  acetaminophen (TYLENOL) 500 MG tablet [id number]  Take 500 mg by mouth daily as needed. [provider]  Active   ALPRAZolam Duanne Moron) 1 MG tablet [id number]  Take 1 mg by mouth 3 (three) times daily as needed. Sherri Rad, MD  Active   aspirin EC 81 MG tablet [id number]  Take 81 mg by mouth daily. [provider]  Active   atorvastatin (LIPITOR) 10 MG tablet [id number]  TAKE 1 TABLET BY MOUTH  DAILY AT 2 PM Karamalegos, Devonne Doughty, DO  Active   b complex vitamins capsule [id number]  Take by mouth. [provider]  Active   Blood Glucose Monitoring Suppl (TRUE METRIX AIR GLUCOSE METER) w/Device KIT [id number]  Check blood sugar 2 times daily Karamalegos, Devonne Doughty, DO  Active   buPROPion (WELLBUTRIN XL) 300 MG 24 hr tablet [id number]  Take 1 tablet (300 mg total) by mouth daily. Olin Hauser, DO  Active   CINNAMON PO [id number]  Take by mouth. [provider]  Active   Coenzyme Q10 100 MG TABS [id number]  Take 100 mg by mouth daily. [provider]  Active   desoximetasone (TOPICORT) 0.25 % cream [id number]  Apply 1 application topically 2 (two) times daily as needed. Olin Hauser, DO  Active   diclofenac sodium (VOLTAREN) 1 % GEL [id number]  Apply topically.  [provider]  Active   dicyclomine (BENTYL) 20 MG tablet [id number]   [provider]  Active            Med Note [name], Jameil Whitmoyer A   Mon Jun 12, 2020  9:15 AM) As needed  DULoxetine (CYMBALTA) 60 MG capsule [id number]  Take 1 capsule (60 mg total) by mouth Two (2) times a day. Morning and early afternoon [provider]  Active   gabapentin (NEURONTIN) 300 MG capsule [id number]  Take 3 capsules by mouth 3 (three) times daily. [provider]  Active            Med Note [name], Trusten Hume A   Wed Nov 08, 2020  4:56 PM) Reports taking 900 mg in the morning and at noon, and take 1200 mg (4 capsules) nightly as directed by [facility]  hydrochlorothiazide (HYDRODIURIL) 25 MG tablet [id number] Yes Take 1 tablet (25 mg total) by mouth daily. Olin Hauser, DO Taking Active   lisinopril (ZESTRIL) 10 MG tablet [id number] Yes Take 1 tablet by mouth daily. [provider] Taking Active   metFORMIN (GLUCOPHAGE) 1000 MG tablet [id number] Yes TAKE 1 TABLET BY MOUTH  TWICE DAILY WITH MEALS Karamalegos, Devonne Doughty, DO Taking Active   Multiple Vitamin (MULTIVITAMIN) tablet [id number]  Take 1 tablet by mouth daily. [provider]  Active   NEEDLE, DISP, 18 G (B-D DISP NEEDLE TW 18GX1") 18G  X 1" MISC [id number]  For use to draw up medication into the syringe Stoioff, Scott C, MD  Active   NEEDLE, DISP, 21 G (BD SAFETYGLIDE SHIELDED NEEDLE) 21G X 1-1/2" [place] [id number]  For use of administering medication in the IM location Stoioff, Ronda Fairly, MD  Active   Omega-3 Fatty Acids (FISH OIL) 1200 MG CAPS [id number]  Take 1 capsule (1,200 mg total) by mouth daily.  Patient taking differently: Take 1,200 mg by mouth 2 (two) times daily.   Karamalegos, Devonne Doughty, DO  Active   pantoprazole (PROTONIX) 40 MG tablet [id number]  Take 40 mg by mouth in the morning. [provider]  Active   polyethylene glycol (MIRALAX / GLYCOLAX) 17 g packet  [id number]  Take 17 g by mouth daily as needed. [provider]  Active   propranolol (INDERAL) 40 MG tablet [id number] Yes TAKE 1 TABLET BY MOUTH THREE TIMES A DAY Karamalegos, Devonne Doughty, DO Taking Active   Semaglutide, 1 MG/DOSE, (OZEMPIC, 1 MG/DOSE,) 2 MG/1.5ML SOPN [id number] Yes Inject into the skin. Inject 1 mg subcutaneously every 7 (seven) days [provider] Taking Active   tamsulosin (FLOMAX) 0.4 MG CAPS capsule [id number]  TAKE 2 CAPSULES BY MOUTH EVERY DAY Stoioff, Scott C, MD  Active   testosterone cypionate (DEPOTESTOSTERONE CYPIONATE) 200 MG/ML injection [id number]  Inject 1 mL (200 mg total) into the muscle every 14 (fourteen) days. Abbie Sons, MD  Active   tiZANidine (ZANAFLEX) 4 MG tablet [id number]  1 tablet twice daily, May take an additional 1.5 tablet at bedtime [provider]  Active   traMADol (ULTRAM) 50 MG tablet [id number]  Take 50 mg by mouth every 6 (six) hours as needed. [provider]  Active   TRUE METRIX BLOOD GLUCOSE TEST test strip [id number]  Check blood sugar 2 times a day Olin Hauser, DO  Active             Pertinent Labs:  Lab Results  Component Value Date   HGBA1C 6.0 (H) 07/11/2021   Lab Results  Component Value Date   CHOL 114 07/11/2021   HDL 30 (L) 07/11/2021   LDLCALC 54 07/11/2021   TRIG 237 (H) 07/11/2021   CHOLHDL 3.8 07/11/2021   Lab Results  Component Value Date   CREATININE 0.88 07/11/2021   BUN 18 07/11/2021   NA 137 07/11/2021   K 3.9 07/11/2021   CL 97 (L) 07/11/2021   CO2 31 07/11/2021   BP Readings from Last 3 Encounters:  10/04/21 100/68  09/12/21 127/83  08/31/21 121/80   Pulse Readings from Last 3 Encounters:  10/04/21 73  09/12/21 71  08/31/21 73    SDOH:  (Social Determinants of Health) assessments and interventions performed:    [place]  Review of patient past medical history, allergies, medications, health status, including review of consultants  reports, laboratory and other test data, was performed as part of comprehensive evaluation and provision of chronic care management services.   Care Plan : General Pharmacy (Adult)  Updates made by [name], RPH-CPP since 01/02/2022 12:00 AM     Problem: Disease Progression      Long-Range Goal: Disease Progression Prevented or Minimized   Start Date: 11/13/2020  Expected End Date: 02/11/2021  Recent Progress: On track  Priority: High  Note:   Current Barriers:  Financial Barriers in complicated patient with multiple medical conditions including T2DM, HTN, peripheral neuropathy, depression, GERD, tremors and chronic pain; patient has [facility]  [place] insurance Reports currently in coverage gap of Part D plan coverage Frustration with pill burden  Pharmacist Clinical Goal(s):  Over the next 90 days, patient will maintain control of blood sugar as evidenced by A1C >6.5% through collaboration with PharmD and provider.   Interventions: 1:1 collaboration with Olin Hauser, DO regarding development and update of comprehensive plan of care as evidenced by provider attestation and co-signature Inter-disciplinary care team collaboration (see longitudinal plan of care) Perform chart review Office Visit with [facility] on 2/10 Office Visit with [place] on 2/1. Provider advised patient: Patient continue to monitor skin Referral placed for diabetic inserts Follow-up in 2 months with x-rays of the right foot 3 views weightbearing prior to exam Office Visit with [place] on 11/22/2021 Provider advised patient: If you would like a consultation for partial knee replacement, we would recommend Dr. Marca Ancona or Nolon Bussing at [facility] orthopedic clinic, or [facility] at [facility] Today patient reports he is concerned that broken toe is not fully healing. Has appointment tomorrow with [place] patient with navigating MyChart for instructions from orthopedics visit regarding referral for partial knee replacement  Type 2 Diabetes: Controlled; current treatment: metformin 1000 mg twice daily Ozempic 1 mg weekly  Reports recent morning fasting blood sugars ranging 79-109 Denies symptoms of hypoglycemia Reports spreading meals throughout the day, having well-balanced meals and trying to limit carbohydrates Reports working on weight loss; current home weight 199 lbs Exercise: reports is more active now that he's working (getting out of the house) - working as bus Geophysicist/field seismologist for [employer] to exercise limited by knee pain, injury to toe CPP sends renewal of True Metrix Blood Glucose Test strips to [facility] as requested by patient  Hypertension: Patient currently taking: Lisinopril 10 mg daily Propranolol IR $RemoveBefore'40mg'[id number]$  three times daily Reports tremors well controlled on current dose of propranolol HCTZ 25 mg daily Reports recent home BP readings: Today: 124/80, HR 73 2/28: 111/85, HR 66 2/24: 102/75, HR 65 2/26: 92/70, HR 68 - reports had lightheadedness when stood, but okay when taking positional change slowly Discuss continuing to take positional changes slowly Advise patient to reduce dose of HCTZ to 12.5 mg daily, while continuing current dose of lisinopril and propranolol Send new Rx for HCTZ 12.5 mg daily to pharmacy for patient Will follow up in 2 weeks to review home BP readings Collaborate with PCP Encourage patient to continue home BP monitoring, keep log of results, bring record to medical appointments and call providers for readings outside of established parameters, dizziness or new symptoms   Patient Goals/Self-Care Activities Over the next 90 days, patient will:  - take medications as prescribed - check glucose, document, and provide at future appointments - check blood pressure, document, and provide at future appointments  Follow Up  Plan: Telephone follow up appointment with care management team member scheduled for: 01/16/2022 at 10:45 AM      Wallace Cullens, PharmD, [place], CPP Clinical Pharmacist [place] [phone]

## 2022-01-02 NOTE — Patient Instructions (Signed)
Visit Information ? ?Thank you for taking time to visit with me today. Please don't hesitate to contact me if I can be of assistance to you before our next scheduled telephone appointment. ? ?Following are the goals we discussed today:  ? Goals Addressed   ? ?  ?  ?  ?  ? This Visit's Progress  ?  Pharmacy Goals     ?  Our goal A1c is less than 7%. This corresponds with fasting sugars less than 130 and 2 hour after meal sugars less than 180. Please check your blood sugar and keep a log of the results ? ?Please check your blood pressure, keep record of results and bring this record with you to medical appointments. ? ?Our goal bad cholesterol, or LDL, is less than 70 . This is why it is important to continue taking your atorvastatin ? ?Feel free to call me with any questions or concerns. I look forward to our next call! ? ? ?Wallace Cullens, PharmD, BCACP, CPP ?Clinical Pharmacist ?[facility] ?[place] ?[phone] ? ?  ? ?  ? ? ? ?Our next appointment is by telephone on 01/16/2022 at 10:45 AM ? ?Please call the care guide team at [phone] if you need to cancel or reschedule your appointment.  ? ? ?Patient verbalizes understanding of instructions and care plan provided today and agrees to view in [place]. Active MyChart status confirmed with patient.   ? ?

## 2022-01-15 ENCOUNTER — Other Ambulatory Visit: Payer: Self-pay | Admitting: Family Medicine

## 2022-01-15 NOTE — Telephone Encounter (Signed)
Requested medication (s) are due for refill today:  ? ?Requested medication (s) are on the active medication list: Yes ? ?Last refill:  Unknown  ? ?Future visit scheduled: Yes ? ?Notes to clinic:  On medication list. Prescriber not listed. ? ? ? ?Requested Prescriptions  ?Pending Prescriptions Disp Refills  ? OZEMPIC, 1 MG/DOSE, 4 MG/3ML SOPN [Pharmacy Med Name: OZEMPIC 1 MG/DOSE (4 MG/3 ML)] 9 mL 1  ?  Sig: INJECT 1 MG INTO THE SKIN ONCE A WEEK  ?  ? Endocrinology:  Diabetes - GLP-1 Receptor Agonists - semaglutide Failed - 01/15/2022 10:24 AM  ?  ?  Failed - HBA1C in normal range and within 180 days  ?  Hgb A1c MFr Bld  ?Date Value Ref Range Status  ?07/11/2021 6.0 (H) <5.7 % of total Hgb Final  ?  Comment:  ?  For someone without known diabetes, a hemoglobin  ?A1c value between 5.7% and 6.4% is consistent with ?prediabetes and should be confirmed with a  ?follow-up test. ?. ?For someone with known diabetes, a value <7% ?indicates that their diabetes is well controlled. A1c ?targets should be individualized based on duration of ?diabetes, age, comorbid conditions, and other ?considerations. ?. ?This assay result is consistent with an increased risk ?of diabetes. ?. ?Currently, no consensus exists regarding use of ?hemoglobin A1c for diagnosis of diabetes for children. ?. ?  ?  ?  ?  ?  Passed - Cr in normal range and within 360 days  ?  Creat  ?Date Value Ref Range Status  ?07/11/2021 0.88 0.70 - 1.35 mg/dL Final  ?  ?  ?  ?  Passed - Valid encounter within last 6 months  ?  Recent Outpatient Visits   ? ?      ? 5 months ago Annual physical exam  ? [place], DO  ? 11 months ago Type 2 diabetes, controlled, with neuropathy ([place])  ? [place], DO  ? 1 year ago Essential (primary) hypertension  ? [place], DO  ? 1 year ago Hyperlipidemia associated with type 2 diabetes mellitus ([place])  ?  [place], DO  ? 1 year ago Annual physical exam  ? [place], DO  ? ?  ?  ?Future Appointments   ? ?        ? In 3 weeks [place], Devonne Doughty, DO [facility], [place]  ? In 5 months  [facility], [place]  ? In 7 months Stoioff, Ronda Fairly, MD [place]  ? ?  ? ?  ?  ?  ? ?

## 2022-01-16 ENCOUNTER — Other Ambulatory Visit: Payer: Self-pay | Admitting: Family Medicine

## 2022-01-16 ENCOUNTER — Ambulatory Visit: Payer: Medicare Other | Admitting: Pharmacist

## 2022-01-16 DIAGNOSIS — E1140 Type 2 diabetes mellitus with diabetic neuropathy, unspecified: Secondary | ICD-10-CM

## 2022-01-16 DIAGNOSIS — I10 Essential (primary) hypertension: Secondary | ICD-10-CM

## 2022-01-16 MED ORDER — OZEMPIC (1 MG/DOSE) 4 MG/3ML ~~LOC~~ SOPN: 1.0000 mg | PEN_INJECTOR | SUBCUTANEOUS | 0 refills | Status: DC

## 2022-01-16 MED ORDER — TRUE METRIX BLOOD GLUCOSE TEST VI STRP: ORAL_STRIP | 3 refills | Status: AC

## 2022-01-16 NOTE — Patient Instructions (Signed)
Visit Information ? ?Thank you for taking time to visit with me today. Please don't hesitate to contact me if I can be of assistance to you before our next scheduled telephone appointment. ? ?Following are the goals we discussed today:  ? Goals Addressed   ? ?  ?  ?  ?  ? This Visit's Progress  ?  Pharmacy Goals     ?  Our goal A1c is less than 7%. This corresponds with fasting sugars less than 130 and 2 hour after meal sugars less than 180. Please check your blood sugar and keep a log of the results ? ?Please check your blood pressure, keep record of results and bring this record with you to medical appointments. ? ?Our goal bad cholesterol, or LDL, is less than 70 . This is why it is important to continue taking your atorvastatin ? ?Feel free to call me with any questions or concerns. I look forward to our next call! ? ?Wallace Cullens, PharmD, BCACP, CPP ?Clinical Pharmacist ?[facility] ?[place] ?[phone] ? ?  ? ?  ? ? ? ?Our next appointment is by telephone on 03/18/2022 at 11:15 AM ? ?Please call the care guide team at [phone] if you need to cancel or reschedule your appointment.  ? ? ?Patient verbalizes understanding of instructions and care plan provided today and agrees to view in [place]. Active MyChart status confirmed with patient.   ? ?

## 2022-01-16 NOTE — Chronic Care Management (AMB) (Signed)
? ?Chronic Care Management ?CCM Pharmacy Note ? ?01/16/2022 ?Name:  Dale [name]. MRN:  [id number] DOB:  September 10, 1961 ? ? ?Subjective: ?Dale [name]. is an 61 y.o. year old male who is [name] primary patient of Dale Hauser, DO.  The CCM team was consulted for assistance with disease management and care coordination needs.   ? ?Engaged with patient by telephone for follow up visit for pharmacy case management and/or care coordination [name].  ? ?Objective: ? ?Medications Reviewed Today   ? ? Reviewed by Dale [name], RPH-CPP (Pharmacist) on 01/16/22 at 1316  Med List Status: <None>  ? ?Medication Order Taking? Sig Documenting Provider Last Dose Status Informant  ?acetaminophen (TYLENOL) 500 MG tablet [id number]  Take 500 mg by mouth daily as needed. [provider]  Active   ?ALPRAZolam (XANAX) 1 MG tablet [id number]  Take 1 mg by mouth 3 (three) times daily as needed. Dale Rad, MD  Active   ?aspirin EC 81 MG tablet [id number]  Take 81 mg by mouth daily. [provider]  Active   ?atorvastatin (LIPITOR) 10 MG tablet [id number]  TAKE 1 TABLET BY MOUTH  DAILY AT 2 PM [name], Dale Doughty, DO  Active   ?b complex vitamins capsule [id number]  Take by mouth. [provider]  Active   ?Blood Glucose Monitoring Suppl (TRUE METRIX AIR GLUCOSE METER) w/Device KIT [id number]  Check blood sugar 2 times daily Dale Hauser, DO  Active   ?buPROPion (WELLBUTRIN XL) 300 MG 24 hr tablet [id number]  Take 1 tablet (300 mg total) by mouth daily. Dale Hauser, DO  Active   ?CINNAMON PO [id number]  Take by mouth. [provider]  Active   ?Coenzyme Q10 100 MG TABS [id number]  Take 100 mg by mouth daily. [provider]  Active   ?desoximetasone (TOPICORT) 0.25 % cream [id number]  Apply 1 application topically 2 (two) times daily as needed. Dale Hauser, DO  Active   ?diclofenac sodium (VOLTAREN) 1 % GEL [id number]  Apply topically.  [provider]  Active   ?dicyclomine (BENTYL) 20 MG tablet [id number]   [provider]  Active   ?         ?Med Note [name], Dale [name]   Mon Jun 12, 2020  9:15 AM) As needed  ?DULoxetine (CYMBALTA) 60 MG capsule [id number]  Take 1 capsule (60 mg total) by mouth Two (2) times [name] day. Morning and early afternoon [provider]  Active   ?gabapentin (NEURONTIN) 300 MG capsule [id number]  Take 3 capsules by mouth 3 (three) times daily. [provider]  Active   ?         ?Med Note Dale [name], Dale [name]   Wed Nov 08, 2020  4:56 PM) Reports taking 900 mg in the morning and at noon, and take 1200 mg (4 capsules) nightly as directed by Dale [name] Pain Management [name]  ?lisinopril (ZESTRIL) 10 MG tablet [id number] Yes Take 1 tablet by mouth daily. [provider] Taking Active   ?metFORMIN (GLUCOPHAGE) 1000 MG tablet [id number] Yes TAKE 1 TABLET BY MOUTH  TWICE DAILY WITH MEALS [name], Dale Doughty, DO Taking Active   ?Multiple Vitamin (MULTIVITAMIN) tablet [id number]  Take 1 tablet by mouth daily. [provider]  Active   ?NEEDLE, DISP, 18 G (B-D DISP NEEDLE TW 18GX1") 18G X 1" MISC [id number]  For use to draw up medication into the syringe [name], Dale Fairly, MD  Active   ?NEEDLE, DISP,  21 G (BD SAFETYGLIDE SHIELDED NEEDLE) 21G X 1-1/2" [place] [id number]  For use of administering medication in the IM location [name], Dale Fairly, MD  Active   ?Omega-3 Fatty Acids (FISH OIL) 1200 MG CAPS [id number]  Take 1 capsule (1,200 mg total) by mouth daily.  ?Patient taking differently: Take 1,200 mg by mouth 2 (two) times daily.  ? Dale Hauser, DO  Active   ?pantoprazole (PROTONIX) 40 MG tablet [id number]  Take 40 mg by mouth in the morning. [provider]  Active   ?polyethylene glycol (MIRALAX / GLYCOLAX) 17 g packet [id number]  Take 17 g by mouth daily as needed. [provider]  Active   ?propranolol (INDERAL) 40 MG tablet [id number] Yes TAKE 1 TABLET BY MOUTH  THREE TIMES [name] DAY [name], Dale Doughty, DO Taking Active   ?Semaglutide, 1 MG/DOSE, (OZEMPIC, 1 MG/DOSE,) 4 MG/3ML SOPN [id number] Yes Inject 1 mg into the skin once [name] week. Dale Hauser, DO Taking Active   ?tamsulosin (FLOMAX) 0.4 MG CAPS capsule [id number]  TAKE 2 CAPSULES BY MOUTH EVERY DAY [name], Dale C, MD  Active   ?testosterone cypionate (DEPOTESTOSTERONE CYPIONATE) 200 MG/ML injection [id number]  Inject 1 mL (200 mg total) into the muscle every 14 (fourteen) days. [name], Dale Fairly, MD  Active   ?tiZANidine (ZANAFLEX) 4 MG tablet [id number]  1 tablet twice daily, May take an additional 1/2 tablet at bedtime as needed [provider]  Active   ?traMADol (ULTRAM) 50 MG tablet [id number]  Take 50 mg by mouth every 6 (six) hours as needed. [provider]  Active   ?TRUE METRIX BLOOD GLUCOSE TEST test strip [id number]  Check blood sugar 2 times [name] day Dale Hauser, DO  Active   ? ?  ?  ? ?  ? ? ?Pertinent Labs:  ?Lab Results  ?Component Value Date  ? HGBA1C 6.0 (H) 07/11/2021  ? ?Lab Results  ?Component Value Date  ? CHOL 114 07/11/2021  ? HDL 30 (L) 07/11/2021  ? LDLCALC 54 07/11/2021  ? TRIG 237 (H) 07/11/2021  ? CHOLHDL 3.8 07/11/2021  ? ?Lab Results  ?Component Value Date  ? CREATININE 0.88 07/11/2021  ? BUN 18 07/11/2021  ? NA 137 07/11/2021  ? K 3.9 07/11/2021  ? CL 97 (L) 07/11/2021  ? CO2 31 07/11/2021  ? ?BP Readings from Last 3 Encounters:  ?10/04/21 100/68  ?09/12/21 127/83  ?08/31/21 121/80  ? ?Pulse Readings from Last 3 Encounters:  ?10/04/21 73  ?09/12/21 71  ?08/31/21 73  ? ? ? ?SDOH:  (Social Determinants of Health) assessments and interventions performed:  ? ? ?CCM Care Plan ? ?Review of patient past medical history, allergies, medications, health status, including review of consultants reports, laboratory and other test data, was performed as part of comprehensive evaluation and provision of chronic care management [name].  ? ?Care Plan : General  Pharmacy (Adult)  ?Updates made by Dale [name], RPH-CPP since 01/16/2022 12:00 AM  ?  ? ?Problem: Disease Progression   ?  ? ?Long-Range Goal: Disease Progression Prevented or Minimized   ?Start Date: 11/13/2020  ?Expected End Date: 02/11/2021  ?Recent Progress: On track  ?Priority: High  ?Note:   ?Current Barriers:  ?Financial Barriers in complicated patient with multiple medical conditions including T2DM, HTN, peripheral neuropathy, depression, GERD, tremors and chronic pain; patient has [employer] insurance ?Has reported Ozempic not affordable when in coverage gap of Part D plan coverage ?Frustration with pill  burden ? ?Pharmacist Clinical Goal(s):  ?Over the next 90 days, patient will maintain control of blood sugar as evidenced by A1C >6.5% through collaboration with PharmD and provider.  ? ?Interventions: ?1:1 collaboration with Dale Hauser, DO regarding development and update of comprehensive plan of care as evidenced by provider attestation and co-signature ?Inter-disciplinary care team collaboration (see longitudinal plan of care) ?Today patient reports he is concerned that his toe is coming back to normal color, but continues to have some swelling. Patient requests help locating phone number for provider that he last saw at [facility] regarding his toe.  ?From review of chart, note patient seen regarding toe at [facility] on 2/1. Provide number to patient today ?Reports has scheduled an appointment with [place] for second opinion regarding possible knee replacement surgery ?Encourage patient to follow up with psychiatrist to discuss recent episode anxiety/depression ?Reports feeling fine now, but will call to follow up with provider ? ?Type 2 Diabetes: ?Controlled; current treatment: ?metformin 1000 mg twice daily ?Ozempic 1 mg weekly  ?Reports recent morning fasting blood sugars ranging 89-109 ?Denies symptoms of  hypoglycemia ?Reported spreading meals throughout the day, having well-balanced meals and trying to limit carbohydrates ?Exercise: patient working as Recruitment consultant for [employer] ?Ability to exercise limited by k

## 2022-01-21 DIAGNOSIS — F32A Depression, unspecified: Secondary | ICD-10-CM | POA: Diagnosis not present

## 2022-01-21 DIAGNOSIS — G4700 Insomnia, unspecified: Secondary | ICD-10-CM | POA: Diagnosis not present

## 2022-01-21 DIAGNOSIS — R2689 Other abnormalities of gait and mobility: Secondary | ICD-10-CM | POA: Diagnosis not present

## 2022-01-21 DIAGNOSIS — R413 Other amnesia: Secondary | ICD-10-CM | POA: Diagnosis not present

## 2022-01-21 DIAGNOSIS — R251 Tremor, unspecified: Secondary | ICD-10-CM | POA: Diagnosis not present

## 2022-01-21 DIAGNOSIS — M25569 Pain in unspecified knee: Secondary | ICD-10-CM | POA: Diagnosis not present

## 2022-01-21 DIAGNOSIS — G8929 Other chronic pain: Secondary | ICD-10-CM | POA: Diagnosis not present

## 2022-01-28 DIAGNOSIS — M1712 Unilateral primary osteoarthritis, left knee: Secondary | ICD-10-CM | POA: Diagnosis not present

## 2022-02-01 DIAGNOSIS — Z7984 Long term (current) use of oral hypoglycemic drugs: Secondary | ICD-10-CM | POA: Diagnosis not present

## 2022-02-01 DIAGNOSIS — I10 Essential (primary) hypertension: Secondary | ICD-10-CM

## 2022-02-01 DIAGNOSIS — E1159 Type 2 diabetes mellitus with other circulatory complications: Secondary | ICD-10-CM | POA: Diagnosis not present

## 2022-02-04 ENCOUNTER — Other Ambulatory Visit: Payer: Self-pay | Admitting: Urology

## 2022-02-05 ENCOUNTER — Other Ambulatory Visit: Payer: Self-pay | Admitting: Family Medicine

## 2022-02-05 ENCOUNTER — Ambulatory Visit (INDEPENDENT_AMBULATORY_CARE_PROVIDER_SITE_OTHER): Payer: Medicare Other | Admitting: Family Medicine

## 2022-02-05 ENCOUNTER — Encounter: Payer: Self-pay | Admitting: Family Medicine

## 2022-02-05 VITALS — BP 132/68 | HR 75 | Ht 72.0 in | Wt 198.0 lb

## 2022-02-05 DIAGNOSIS — E1140 Type 2 diabetes mellitus with diabetic neuropathy, unspecified: Secondary | ICD-10-CM | POA: Diagnosis not present

## 2022-02-05 DIAGNOSIS — F331 Major depressive disorder, recurrent, moderate: Secondary | ICD-10-CM | POA: Diagnosis not present

## 2022-02-05 DIAGNOSIS — F419 Anxiety disorder, unspecified: Secondary | ICD-10-CM

## 2022-02-05 DIAGNOSIS — I10 Essential (primary) hypertension: Secondary | ICD-10-CM

## 2022-02-05 DIAGNOSIS — Z0000 Encounter for general adult medical examination without abnormal findings: Secondary | ICD-10-CM

## 2022-02-05 DIAGNOSIS — E1349 Other specified diabetes mellitus with other diabetic neurological complication: Secondary | ICD-10-CM | POA: Diagnosis not present

## 2022-02-05 DIAGNOSIS — E538 Deficiency of other specified B group vitamins: Secondary | ICD-10-CM

## 2022-02-05 DIAGNOSIS — Z125 Encounter for screening for malignant neoplasm of prostate: Secondary | ICD-10-CM

## 2022-02-05 DIAGNOSIS — E1169 Type 2 diabetes mellitus with other specified complication: Secondary | ICD-10-CM

## 2022-02-05 MED ORDER — OZEMPIC (1 MG/DOSE) 4 MG/3ML ~~LOC~~ SOPN: 1.0000 mg | PEN_INJECTOR | SUBCUTANEOUS | 5 refills | Status: DC

## 2022-02-05 NOTE — Progress Notes (Signed)
? ?Subjective:  ? ? Patient ID: Dale [name]., male    DOB: Aug 09, 1961, 61 y.o.   MRN: [id number] ? ?Dale [name]. is a 61 y.o. male presenting on 02/05/2022 for Diabetes and Hypertension ? ? ?HPI ? ?CHRONIC HTN: ?Now improved BP. Recently per CCM pharmacy he has reduced meds. ?Followed by [facility] Dr Nehemiah Massed ?Current Meds - Lisinopril '10mg'$  daily, Propranolol '40mg'$  TID ?- OFF Amlodipine, HCTZ ?Reports good compliance, took meds today. Tolerating well, w/o complaints. ?Denies CP, dyspnea, HA, edema, dizziness / lightheadedness ?  ?Type 2 DM with peripheral neuropathy ?Hyperlipidemia T2DM ?Last A1c 6.0% (07/11/21) ?Due for A1c ?On medications - Ozempic '1mg'$  weekly, Metformin '1000mg'$  BID ?CBG 100-120s avg now ?Lab reviewed ?On Statin ? ?Kidney Cysts ?Asking for repeat imaging per Urology ?  ?Tremors ?Propranolol '40mg'$  TID - improved tremors ?  ?Recurrent Major Depression ?Followed by Psychiatry [facility] ?Currently on medication management ? ?He has upcoming video conference with Psychiatry on 4/14 ?Recent acute panic attack / anxiety flare ? May need to take Alprazolam while working or as back up plan. ? ?[facility] ?Knee Osteoarthritis - Left Knee drained fluid off of it, they recommended "half" knee replacement specialist, but he declined. ? ?Went to [facility] - Dr Rudene Christians  ? ?Right Foot, 2nd toe middle phalanx fracture injury initially 10/2021, unsure actual source of injury, has seen [facility] and Podiatry, they have done X-rays and recommended supportive measures and taping but still causing significant pain. ? ? ? ?  08/06/2021  ?  2:49 PM 06/19/2021  ? 11:50 AM 07/25/2020  ? 11:45 AM  ?Depression screen PHQ 2/9  ?Decreased Interest '2 3 2  '$ ?Down, Depressed, Hopeless '2 3 1  '$ ?PHQ - 2 Score '4 6 3  '$ ?Altered sleeping 0 0 0  ?Tired, decreased energy '1 3 1  '$ ?Change in appetite 2 0 0  ?Feeling bad or failure about yourself  0 0 0  ?Trouble concentrating 0 3 0  ?Moving slowly or  fidgety/restless 0 0 0  ?Suicidal thoughts 0 0 0  ?PHQ-9 Score '7 12 4  '$ ?Difficult doing work/chores Very difficult Somewhat difficult Somewhat difficult  ? ? ?Social History  ? ?Tobacco Use  ? Smoking status: Former  ?  Types: Cigarettes  ?  Start date: 08/29/1979  ?  Quit date: 11/04/2010  ?  Years since quitting: 11.2  ? Smokeless tobacco: Former  ?  Quit date: 11/19/2010  ? Tobacco comments:  ?  1 to 1.5 packs per day  ?Vaping Use  ? Vaping Use: Never used  ?Substance Use Topics  ? Alcohol use: No  ?  Alcohol/week: 0.0 standard drinks  ? Drug use: No  ? ? ?Review of Systems ?Per HPI unless specifically indicated above ? ?   ?Objective:  ?  ?BP 132/68   Pulse 75   Ht 6' (1.829 m)   Wt 198 lb (89.8 kg)   SpO2 97%   BMI 26.85 kg/m?   ?Wt Readings from Last 3 Encounters:  ?02/05/22 198 lb (89.8 kg)  ?10/04/21 220 lb 1.6 oz (99.8 kg)  ?09/12/21 219 lb 3.2 oz (99.4 kg)  ?  ? ?Physical Exam ?Vitals and nursing note reviewed.  ?Constitutional:   ?   General: He is not in acute distress. ?   Appearance: Normal appearance. He is well-developed. He is not diaphoretic.  ?   Comments: Well-appearing, comfortable, cooperative  ?HENT:  ?   Head: Normocephalic and atraumatic.  ?Eyes:  ?  General:     ?   Right eye: No discharge.     ?   Left eye: No discharge.  ?   Conjunctiva/sclera: Conjunctivae normal.  ?Cardiovascular:  ?   Rate and Rhythm: Normal rate.  ?Pulmonary:  ?   Effort: Pulmonary effort is normal.  ?Skin: ?   General: Skin is warm and dry.  ?   Findings: No erythema or rash.  ?Neurological:  ?   Mental Status: He is alert and oriented to person, place, and time.  ?Psychiatric:     ?   Mood and Affect: Mood normal.     ?   Behavior: Behavior normal.     ?   Thought Content: Thought content normal.  ?   Comments: Well groomed, good eye contact, normal speech and thoughts  ? ? ? ?XR Foot 3 Or More Views Right ? ?Anatomical Region Laterality Modality  ?Foot left Computed Radiography  ? ?Impression ? ? ?1. Small right  ankle joint effusion. Moderate right first MTP osteoarthritis. Hallux valgus deformity. Scattered interphalangeal joints and midfoot degenerative change. Mature periosteal reaction mid shaft of the second and third metatarsals, likely stress-related. No displaced fractures. No erosions. ?Narrative ? ?EXAM: XR FOOT 3 OR MORE VIEWS RIGHT  ?DATE: 12/05/2021 12:53 PM  ?ACCESSION: [id number] UN  ?DICTATED: 12/05/2021 2:04 PM  ?INTERPRETATION LOCATION: [place]  ? ?CLINICAL INDICATION: 61 years old Male with pain  - M79.671 - Right foot pain    ? ?COMPARISON: None.  ? ?TECHNIQUE: Dorsoplantar, oblique and lateral views of the right foot.  ? ?FINDINGS/ ?Procedure Note ? ?Hunt Oris, MD - 12/05/2021  ?Formatting of this note might be different from the original.  ?EXAM: XR FOOT 3 OR MORE VIEWS RIGHT  ?DATE: 12/05/2021 12:53 PM  ?ACCESSION: [id number] UN  ?DICTATED: 12/05/2021 2:04 PM  ?INTERPRETATION LOCATION: [place]  ? ?CLINICAL INDICATION: 61 years old Male with pain  - M79.671 - Right foot pain    ? ?COMPARISON: None.  ? ?TECHNIQUE: Dorsoplantar, oblique and lateral views of the right foot.  ? ?FINDINGS/  ?IMPRESSION:  ? ?1. Small right ankle joint effusion. Moderate right first MTP osteoarthritis. Hallux valgus deformity. Scattered interphalangeal joints and midfoot degenerative change. Mature periosteal reaction mid shaft of the second and third metatarsals, likely stress-related. No displaced fractures. No erosions. ?Exam End: 12/05/21 12:53   ?Specimen Collected: 12/05/21 14:04 Last Resulted: 12/05/21 14:07  ?Received From: [place]  Result Received: 01/15/22 15:31  ? ? ? ?Results for orders placed or performed in visit on 09/12/21  ?Carbon monoxide, blood (performed at ref lab)  ?Result Value Ref Range  ? Carbon Monoxide, Blood 1.6 0.0 - 3.6 %  ?JAK2 genotypr (mutation detection, qual)  ?Result Value Ref Range  ? JAK2 GenotypR Comment   ? Director Review, JAK2 Comment   ? BACKGROUND: Comment    ?Erythropoietin  ?Result Value Ref Range  ? Erythropoietin 21.5 (H) 2.6 - 18.5 mIU/mL  ?Ferritin  ?Result Value Ref Range  ? Ferritin 16 (L) 24 - 336 ng/mL  ?Iron and TIBC  ?Result Value Ref Range  ? Iron 65 45 - 182 ug/dL  ? TIBC 403 250 - 450 ug/dL  ? Saturation Ratios 16 (L) 17.9 - 39.5 %  ? UIBC 338 ug/dL  ?CBC  ?Result Value Ref Range  ? WBC 8.4 4.0 - 10.5 K/uL  ? RBC 5.50 4.22 - 5.81 MIL/uL  ? Hemoglobin 15.8 13.0 - 17.0 g/dL  ? HCT 48.2 39.0 -  52.0 %  ? MCV 87.6 80.0 - 100.0 fL  ? MCH 28.7 26.0 - 34.0 pg  ? MCHC 32.8 30.0 - 36.0 g/dL  ? RDW 15.8 (H) 11.5 - 15.5 %  ? Platelets 255 150 - 400 K/uL  ? nRBC 0.0 0.0 - 0.2 %  ? ?   ?Assessment & Plan:  ? ?Problem List Items Addressed This Visit   ? ? Type 2 diabetes, controlled, with neuropathy ([place]) - Primary  ? Relevant Medications  ? OZEMPIC, 1 MG/DOSE, 4 MG/3ML SOPN  ? Other Relevant Orders  ? Hemoglobin A1c  ? Moderate episode of recurrent major depressive disorder ([place])  ? Essential (primary) hypertension  ? Anxiety  ? ?Other Visit Diagnoses   ? ? Other diabetic neurological complication associated with other specified diabetes mellitus ([place])      ? Relevant Medications  ? OZEMPIC, 1 MG/DOSE, 4 MG/3ML SOPN  ? ?  ?  ?T2DM ?Complication with DM neuropathy ?Prior A1c controlled ?Limited due to financial w high deductible on ozempic ?Will give 1 sample box of ozempic 0.'5mg'$  today ?He can re order '1mg'$  when ready and able ? ?R Foot 2nd toe fracture - healing. Still painful. He goes back to [employer] 4/5 and I have contacted them via staff message, patient discouraged today since limited success or improvement. ? ?Anxiety ?Chronic problem ?He has upcoming virtual with psychiatry ?Discussed may need to have Alprazolam as back up plan or available if need for panic attacks while at work. ? ?HTN ?Improved control even off HCTZ ?Discussed importance of Lisinopril low dose '10mg'$  for kidney function ? ?Major Depression recurrent ?Controlled on med ?Followed by  psychiatry ? ?Meds ordered this encounter  ?Medications  ? OZEMPIC, 1 MG/DOSE, 4 MG/3ML SOPN  ?  Sig: Inject 1 mg into the skin once a week.  ?  Dispense:  3 mL  ?  Refill:  5  ? ? ? ?Follow up plan: ?Return in about 6 months (aroun

## 2022-02-05 NOTE — Patient Instructions (Addendum)
Thank you for coming to the office today. ? ?Lab today for A1c ? ?Sample Ozempic ? ?Sent message to Venia Carbon for toe ? ?Ask Dr Gemma Payor Urology for repeat imaging on Kidneys ? ?Keep on Lisinopril '10mg'$  daily, this is for preservation of kidney function. We could lower to '5mg'$  in future. ? ?DUE for FASTING BLOOD WORK (no food or drink after midnight before the lab appointment, only water or coffee without cream/sugar on the morning of) ? ?SCHEDULE "Lab Only" visit in the morning at the clinic for lab draw in 6 MONTHS  ? ?- Make sure Lab Only appointment is at about 1 week before your next appointment, so that results will be available ? ?For Lab Results, once available within 2-3 days of blood draw, you can can log in to MyChart online to view your results and a brief explanation. Also, we can discuss results at next follow-up visit. ? ? ? ?Please schedule a Follow-up Appointment to: Return in about 6 months (around 08/07/2022) for 6 month fasting lab only then 1 week later Annual Physical. ? ?If you have any other questions or concerns, please feel free to call the office or send a message through [place]. You may also schedule an earlier appointment if necessary. ? ?Additionally, you may be receiving a survey about your experience at our office within a few days to 1 week by e-mail or mail. We value your feedback. ? ?[name], DO ?[place] ?

## 2022-02-06 DIAGNOSIS — E1140 Type 2 diabetes mellitus with diabetic neuropathy, unspecified: Secondary | ICD-10-CM | POA: Diagnosis not present

## 2022-02-06 DIAGNOSIS — S92524A Nondisplaced fracture of medial phalanx of right lesser toe(s), initial encounter for closed fracture: Secondary | ICD-10-CM | POA: Diagnosis not present

## 2022-02-06 DIAGNOSIS — M19071 Primary osteoarthritis, right ankle and foot: Secondary | ICD-10-CM | POA: Diagnosis not present

## 2022-02-06 DIAGNOSIS — S92524D Nondisplaced fracture of medial phalanx of right lesser toe(s), subsequent encounter for fracture with routine healing: Secondary | ICD-10-CM | POA: Diagnosis not present

## 2022-02-06 LAB — HEMOGLOBIN A1C
Hgb A1c MFr Bld: 5.5 % of total Hgb (ref <5.7)
Mean Plasma Glucose: 111 mg/dL
eAG (mmol/L): 6.2 mmol/L

## 2022-03-01 ENCOUNTER — Other Ambulatory Visit: Payer: Medicare Other

## 2022-03-04 ENCOUNTER — Other Ambulatory Visit: Payer: Medicare Other

## 2022-03-06 ENCOUNTER — Other Ambulatory Visit: Payer: Self-pay | Admitting: *Deleted

## 2022-03-06 DIAGNOSIS — Z880 Allergy status to penicillin: Secondary | ICD-10-CM | POA: Diagnosis not present

## 2022-03-06 DIAGNOSIS — M545 Low back pain, unspecified: Secondary | ICD-10-CM | POA: Diagnosis not present

## 2022-03-06 DIAGNOSIS — G8929 Other chronic pain: Secondary | ICD-10-CM | POA: Diagnosis not present

## 2022-03-06 DIAGNOSIS — M5032 Other cervical disc degeneration, mid-cervical region, unspecified level: Secondary | ICD-10-CM | POA: Diagnosis not present

## 2022-03-06 DIAGNOSIS — Z885 Allergy status to narcotic agent status: Secondary | ICD-10-CM | POA: Diagnosis not present

## 2022-03-06 DIAGNOSIS — G894 Chronic pain syndrome: Secondary | ICD-10-CM | POA: Diagnosis not present

## 2022-03-06 DIAGNOSIS — Z79891 Long term (current) use of opiate analgesic: Secondary | ICD-10-CM | POA: Diagnosis not present

## 2022-03-06 DIAGNOSIS — M961 Postlaminectomy syndrome, not elsewhere classified: Secondary | ICD-10-CM | POA: Diagnosis not present

## 2022-03-06 DIAGNOSIS — M542 Cervicalgia: Secondary | ICD-10-CM | POA: Diagnosis not present

## 2022-03-06 DIAGNOSIS — M5033 Other cervical disc degeneration, cervicothoracic region: Secondary | ICD-10-CM | POA: Diagnosis not present

## 2022-03-06 DIAGNOSIS — Z7982 Long term (current) use of aspirin: Secondary | ICD-10-CM | POA: Diagnosis not present

## 2022-03-06 DIAGNOSIS — Z7984 Long term (current) use of oral hypoglycemic drugs: Secondary | ICD-10-CM | POA: Diagnosis not present

## 2022-03-06 DIAGNOSIS — M25561 Pain in right knee: Secondary | ICD-10-CM | POA: Diagnosis not present

## 2022-03-06 DIAGNOSIS — Z79899 Other long term (current) drug therapy: Secondary | ICD-10-CM | POA: Diagnosis not present

## 2022-03-06 DIAGNOSIS — M25562 Pain in left knee: Secondary | ICD-10-CM | POA: Diagnosis not present

## 2022-03-06 DIAGNOSIS — G629 Polyneuropathy, unspecified: Secondary | ICD-10-CM | POA: Diagnosis not present

## 2022-03-06 DIAGNOSIS — E291 Testicular hypofunction: Secondary | ICD-10-CM

## 2022-03-06 DIAGNOSIS — M79671 Pain in right foot: Secondary | ICD-10-CM | POA: Diagnosis not present

## 2022-03-06 DIAGNOSIS — M47812 Spondylosis without myelopathy or radiculopathy, cervical region: Secondary | ICD-10-CM | POA: Diagnosis not present

## 2022-03-07 ENCOUNTER — Other Ambulatory Visit: Payer: Medicare Other

## 2022-03-07 DIAGNOSIS — E291 Testicular hypofunction: Secondary | ICD-10-CM

## 2022-03-08 LAB — HEMOGLOBIN AND HEMATOCRIT, BLOOD
Hematocrit: 50.3 % (ref 37.5–51.0)
Hemoglobin: 16.7 g/dL (ref 13.0–17.7)

## 2022-03-08 LAB — TESTOSTERONE: Testosterone: 184 ng/dL — ABNORMAL LOW (ref 264–916)

## 2022-03-18 ENCOUNTER — Telehealth: Payer: Medicare Other

## 2022-03-18 ENCOUNTER — Telehealth: Payer: Self-pay | Admitting: Pharmacist

## 2022-03-18 NOTE — Telephone Encounter (Signed)
?  Chronic Care Management  ? ?Outreach Note ? ?03/18/2022 ?Name: Howell Groesbeck. MRN: [id number] DOB: 03/19/1961 ? ?Referred by: Olin Hauser, DO ?Reason for referral : No chief complaint on file. ? ? ?Was unable to reach patient via telephone today and have left HIPAA compliant voicemail asking patient to return my call. ? ? ?Follow Up Plan: Will collaborate with Care Guide to outreach to schedule follow up with me ? ?Wallace Cullens, PharmD, BCACP ?Clinical Pharmacist ?[facility] ?[phone] ? ?

## 2022-03-25 ENCOUNTER — Other Ambulatory Visit: Payer: Self-pay | Admitting: Urology

## 2022-03-26 ENCOUNTER — Ambulatory Visit
Admission: EM | Admit: 2022-03-26 | Discharge: 2022-03-26 | Disposition: A | Discharge status: Home / Self Care | Payer: Medicare Other | Attending: Physician Assistant | Admitting: Physician Assistant

## 2022-03-26 ENCOUNTER — Ambulatory Visit: Payer: Self-pay

## 2022-03-26 DIAGNOSIS — R102 Pelvic and perineal pain: Secondary | ICD-10-CM

## 2022-03-26 DIAGNOSIS — I10 Essential (primary) hypertension: Secondary | ICD-10-CM | POA: Diagnosis not present

## 2022-03-26 LAB — URINALYSIS, ROUTINE W REFLEX MICROSCOPIC
Bilirubin Urine: NEGATIVE
Glucose, UA: NEGATIVE mg/dL
Hgb urine dipstick: NEGATIVE
Ketones, ur: NEGATIVE mg/dL
Leukocytes,Ua: NEGATIVE
Nitrite: NEGATIVE
Protein, ur: NEGATIVE mg/dL
Specific Gravity, Urine: 1.01 (ref 1.005–1.030)
pH: 6.5 (ref 5.0–8.0)

## 2022-03-26 MED ORDER — LACTULOSE 10 GM/15ML PO SOLN: 20.0000 g | Freq: Every day | ORAL | 0 refills | Status: DC | PRN

## 2022-03-26 MED ORDER — PHENAZOPYRIDINE HCL 200 MG PO TABS: 200.0000 mg | ORAL_TABLET | Freq: Three times a day (TID) | ORAL | 0 refills | Status: DC

## 2022-03-26 NOTE — ED Triage Notes (Signed)
Pt c/o high blood pressure x3days.   Pt is having a headache and feels "hazy".   Pt last 2 blood pressure readings are.  156/90 150/100   Abdominal pressure, pain x2-3days and urinary retention x32month Pt sees his Urologist in June.

## 2022-03-26 NOTE — Telephone Encounter (Signed)
   Chief Complaint: BP 150/100 per nurse at work. 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 No availability. Requesting to be worked in today. Answer Assessment - Initial Assessment Questions 1. BLOOD PRESSURE: "What is the blood pressure?" "Did you take at least two measurements 5 minutes apart?"     150/100 2. ONSET: "When did you take your blood pressure?"     Today 3. HOW: "How did you obtain the blood pressure?" (e.g., visiting nurse, automatic home BP monitor)     Nurse 4. HISTORY: "Do you have a history of high blood pressure?"     Yes 5. MEDICATIONS: "Are you taking any medications for blood pressure?" "Have you missed any doses recently?"     Yes 6. OTHER SYMPTOMS: "Do you have any symptoms?" (e.g., headache, chest pain, blurred vision, difficulty breathing, weakness)     Flushed face 7. PREGNANCY: "Is there any chance you are pregnant?" "When was your last menstrual period?"     N/A  Protocols used: Blood Pressure - High-A-AH

## 2022-03-26 NOTE — Discharge Instructions (Addendum)
For your blood pressure -Your EKG showed a normal rhythm and pace, on exam your heart is beating at a regular pace and rhythm -Here in office your blood pressure was 150/88, while elevated from your baseline is likely causing your headache and haziness this is not an emergent level with concern for stroke -As you have been well controlled on your blood pressure medicine up until 3 days ago I do not want to make any changes and would like you to follow-up with your primary care doctor within the next few days or early next week depending on your schedule for reevaluation of your symptoms -Your neurological exam was normal -At any point if your blood pressure increases and you continue to have a headache or you begin to have blurred vision, seeing spots, dizziness, lightheadedness, chest pain, shortness of breath or vomiting please go to the nearest emergency department for evaluation and management  For your urinary symptoms  -Your urinalysis was negative for infection -Your lower abdominal pressure may be related to constipation as the stool sits in the intestines will begin to push on other areas, you may use lactulose as needed for the next 3 days, ensure that you are at home after taking medicine as you will need to be close to a bathroom, increase your fluid intake while using this medication to prevent dehydration, which you have had a bowel movement stop use -You may use Pyridium every 8 hours as needed for the next 3 days, this medication will turn your urine a bright orange which is normal, it will go away once you stop the medicine, this medication will temporarily help to reduce your symptoms but will not fix the underlying issue -Keep upcoming appointment with your urologist for further evaluation of your symptoms

## 2022-03-26 NOTE — ED Provider Notes (Signed)
[facility]    CSN: [id number] Arrival date & time: 03/26/22  1151      History   Chief Complaint Chief Complaint  Patient presents with   Hypertension    HPI Dale [name]. is a 61 y.o. male.   Patient presents with a intermittent left-sided headache and sensation of haziness with elevated blood pressure readings for 3 days.  At home blood pressure peaking at 150/100 .  Baseline is typically 120s/130s over 80s.  Taking lisinopril and propranolol daily, has not missed dosage.  Denies dietary changes or changes in day-to-day routine.  Denies increased stress.  Denies shortness of breath, chest pain or tightness, visual changes, dizziness, lightheadedness.    Patient concerned with lower abdominal pressure beginning 3 days ago.  Has been having issues with urinary frequency, weakened stream and sensation of not emptying the bladder for 1 month.  Has upcoming urology appointment on June 1.  Denies dysuria, urinary urgency, flank pain, fever, chills.    Past Medical History:  Diagnosis Date   ADHD (attention deficit hyperactivity disorder)    Anxiety    Coronary artery disease    Coronary artery disease    Coronary atherosclerosis    Depression    GERD (gastroesophageal reflux disease)    Glaucoma    Headache    migrane   Heart disease    Hyperlipidemia    Hypertension    Hypertriglyceridemia    Hypogonadism in male    MVP (mitral valve prolapse)    Narcolepsy and cataplexy    Non-alcoholic fatty liver disease     Patient Active Problem List   Diagnosis Date Noted   Erectile dysfunction due to arterial insufficiency 06/29/2019   Hypogonadism in male 06/29/2019   Pes cavus 04/30/2018   Chronic pain syndrome 02/18/2018   Chronic headaches 01/21/2018   Postoperative CSF leak 10/31/2017   Moderate episode of recurrent major depressive disorder ([place]) 07/21/2017   Obesity due to excess calories 08/07/2016   MCI (mild cognitive impairment) with memory  loss 08/06/2016   Peripheral neuropathy 08/06/2016   Anxiety 07/17/2016   Insomnia 07/17/2016   Eczema 01/22/2016   Benign essential tremor 10/03/2015   Glaucoma 06/27/2015   B12 deficiency 06/27/2015   Cataplexy and narcolepsy 06/26/2015   Billowing mitral valve 06/26/2015   History of prolonged Q-T interval on ECG 06/26/2015   Herniation of nucleus pulposus 06/26/2015   Low testosterone 06/26/2015   Obesity (BMI 30.0-34.9) 06/26/2015   CAD (coronary artery disease), native coronary artery 06/26/2015   Fatty infiltration of liver 10/21/2014   Psoriasis 07/22/2014   Acid reflux 07/22/2014   Type 2 diabetes, controlled, with neuropathy ([place]) 06/22/2014   Hyperlipidemia associated with type 2 diabetes mellitus ([place]) 06/22/2014   Essential (primary) hypertension 06/22/2014   Failed back syndrome of lumbar spine 04/29/2014   Chronic pain associated with significant psychosocial dysfunction 04/29/2014   History of surgical procedure 04/29/2014   Polypharmacy 09/09/2013   Pain medication agreement signed 09/09/2013   Other long term (current) drug therapy 11/20/1998   Encounter for long-term (current) use of other medications 11/20/1998    Past Surgical History:  Procedure Laterality Date   COLONOSCOPY WITH PROPOFOL N/A 11/18/2018   Procedure: COLONOSCOPY WITH PROPOFOL;  Surgeon: Manya Silvas, MD;  Location[facility] ENDOSCOPY;  Service: Endoscopy;  Laterality: N/A;   ESOPHAGOGASTRODUODENOSCOPY (EGD) WITH PROPOFOL N/A 11/18/2018   Procedure: ESOPHAGOGASTRODUODENOSCOPY (EGD) WITH PROPOFOL;  Surgeon: Manya Silvas, MD;  Location: [facility] ENDOSCOPY;  Service:  Endoscopy;  Laterality: N/A;   pain pump implant and removal Bilateral    Dec. 21 2019   SPINE SURGERY     Multiple surgeries, initial 2001, thoracic disc repair and recurrent revisions.   THROAT SURGERY         Home Medications    Prior to Admission medications   Medication Sig Start Date End Date Taking? Authorizing  Provider  acetaminophen (TYLENOL) 500 MG tablet Take 500 mg by mouth daily as needed.   Yes [provider]  ALPRAZolam Duanne Moron) 1 MG tablet Take 1 mg by mouth 3 (three) times daily as needed. 07/21/17  Yes Sherri Rad, MD  aspirin EC 81 MG tablet Take 81 mg by mouth daily.   Yes [provider]  atorvastatin (LIPITOR) 10 MG tablet TAKE 1 TABLET BY MOUTH  DAILY AT 2 PM 10/26/21  Yes Karamalegos, Devonne Doughty, DO  b complex vitamins capsule Take by mouth.   Yes [provider]  Blood Glucose Monitoring Suppl (TRUE METRIX AIR GLUCOSE METER) w/Device KIT Check blood sugar 2 times daily 04/27/20  Yes Karamalegos, Alexander J, DO  buPROPion (WELLBUTRIN XL) 300 MG 24 hr tablet Take 1 tablet (300 mg total) by mouth daily. 11/06/20  Yes Karamalegos, Devonne Doughty, DO  CINNAMON PO Take by mouth.   Yes [provider]  Coenzyme Q10 100 MG TABS Take 100 mg by mouth daily.   Yes [provider]  desoximetasone (TOPICORT) 0.25 % cream Apply 1 application topically 2 (two) times daily as needed. 11/21/20  Yes Karamalegos, Devonne Doughty, DO  diclofenac sodium (VOLTAREN) 1 % GEL Apply topically. 04/10/18  Yes [provider]  dicyclomine (BENTYL) 20 MG tablet  04/07/18  Yes [provider]  DULoxetine (CYMBALTA) 60 MG capsule Take 1 capsule (60 mg total) by mouth Two (2) times a day. Morning and early afternoon 05/22/21  Yes [provider]  gabapentin (NEURONTIN) 300 MG capsule Take 3 capsules by mouth 3 (three) times daily.   Yes [provider]  lisinopril (ZESTRIL) 10 MG tablet Take 1 tablet by mouth daily. 01/08/21  Yes [provider]  metFORMIN (GLUCOPHAGE) 1000 MG tablet TAKE 1 TABLET BY MOUTH  TWICE DAILY WITH MEALS 09/22/21  Yes Karamalegos, Devonne Doughty, DO  Multiple Vitamin (MULTIVITAMIN) tablet Take 1 tablet by mouth daily.   Yes [provider]  Omega-3 Fatty Acids (FISH OIL) 1200 MG CAPS Take 1 capsule (1,200 mg  total) by mouth daily. Patient taking differently: Take 1,200 mg by mouth 2 (two) times daily. 07/30/16  Yes Karamalegos, Alexander J, DO  OZEMPIC, 1 MG/DOSE, 4 MG/3ML SOPN Inject 1 mg into the skin once a week. 02/05/22  Yes Karamalegos, Devonne Doughty, DO  pantoprazole (PROTONIX) 40 MG tablet Take 40 mg by mouth in the morning. 02/07/18  Yes [provider]  polyethylene glycol (MIRALAX / GLYCOLAX) 17 g packet Take 17 g by mouth daily as needed.   Yes [provider]  propranolol (INDERAL) 40 MG tablet TAKE 1 TABLET BY MOUTH THREE TIMES A DAY 02/11/21  Yes Karamalegos, Devonne Doughty, DO  tamsulosin (FLOMAX) 0.4 MG CAPS capsule TAKE 2 CAPSULES BY MOUTH EVERY DAY 12/31/21  Yes Stoioff, Ronda Fairly, MD  testosterone cypionate (DEPOTESTOSTERONE CYPIONATE) 200 MG/ML injection INJECT 1 ML (200 MG TOTAL) INTO THE MUSCLE EVERY 14 DAYS 02/10/22  Yes Stoioff, Ronda Fairly, MD  tiZANidine (ZANAFLEX) 4 MG tablet 1 tablet twice daily, May take an additional 1/2 tablet at bedtime as needed  10/08/18  Yes [provider]  traMADol (ULTRAM) 50 MG tablet Take 50 mg by mouth every 6 (six) hours as needed.   Yes [provider]  TRUE METRIX BLOOD GLUCOSE TEST test strip Check blood sugar 2 times a day 01/16/22  Yes Karamalegos, Devonne Doughty, DO    Family History Family History  Problem Relation Age of Onset   Depression Mother    Anxiety disorder Mother    Diabetes Mother    Heart failure Mother    Heart attack Father    Diabetes Sister    Obesity Sister    Drug abuse Brother    Alcohol abuse Brother    Depression Sister    Anxiety disorder Sister    Drug abuse Sister    Anxiety disorder Sister    Seizures Sister    Diabetes Sister    Depression Sister     Social History Social History   Tobacco Use   Smoking status: Former    Types: Cigarettes    Start date: 08/29/1979    Quit date: 11/04/2010    Years since quitting: 11.3   Smokeless tobacco: Former    Quit date: 11/19/2010    Tobacco comments:    1 to 1.5 packs per day  Vaping Use   Vaping Use: Never used  Substance Use Topics   Alcohol use: No    Alcohol/week: 0.0 standard drinks   Drug use: No     Allergies   Benzoin, Doxycycline hyclate, Morphine, and Penicillins   Review of Systems Review of Systems  Constitutional: Negative.   Respiratory: Negative.    Cardiovascular: Negative.   Genitourinary:  Positive for difficulty urinating, dysuria and frequency. Negative for decreased urine volume, enuresis, flank pain, genital sores, hematuria, penile discharge, penile pain, penile swelling, scrotal swelling, testicular pain and urgency.  Skin: Negative.   Neurological:  Positive for headaches. Negative for dizziness, tremors, seizures, syncope, facial asymmetry, speech difficulty, light-headedness and numbness.    Physical Exam Triage Vital Signs ED Triage Vitals  Enc Vitals Group     BP 03/26/22 1206 (!) 150/88     Pulse Rate 03/26/22 1206 72     Resp 03/26/22 1206 18     Temp 03/26/22 1206 98.1 F (36.7 C)     Temp src --      SpO2 03/26/22 1206 100 %     Weight 03/26/22 1203 200 lb (90.7 kg)     Height 03/26/22 1203 6' (1.829 m)     Head Circumference --      Peak Flow --      Pain Score 03/26/22 1202 8     Pain Loc --      Pain Edu? --      Excl. in [place]? --    No data found.  Updated Vital Signs BP (!) 150/88 (BP Location: Left Arm)   Pulse 72   Temp 98.1 F (36.7 C)   Resp 18   Ht 6' (1.829 m)   Wt 200 lb (90.7 kg)   SpO2 100%   BMI 27.12 kg/m   Visual Acuity Right Eye Distance:   Left Eye Distance:   Bilateral Distance:    Right Eye Near:   Left Eye Near:    Bilateral Near:     Physical Exam Constitutional:      Appearance: He is well-developed.  HENT:     Head: Normocephalic.  Eyes:     Extraocular Movements: Extraocular movements intact.  Cardiovascular:  Rate and Rhythm: Normal rate and regular rhythm.     Pulses: Normal pulses.     Heart sounds: Normal  heart sounds.  Pulmonary:     Effort: Pulmonary effort is normal.     Breath sounds: Normal breath sounds.  Abdominal:     General: Abdomen is flat. Bowel sounds are normal.     Palpations: Abdomen is soft.     Tenderness: There is abdominal tenderness in the suprapubic area.  Skin:    General: Skin is warm and dry.  Neurological:     General: No focal deficit present.     Mental Status: He is alert and oriented to person, place, and time.  Psychiatric:        Mood and Affect: Mood normal.        Behavior: Behavior normal.     UC Treatments / Results  Labs (all labs ordered are listed, but only abnormal results are displayed) Labs Reviewed  URINALYSIS, [place] MICROSCOPIC    EKG   Radiology No results found.  Procedures Procedures (including critical care time)  Medications Ordered in UC Medications - No data to display  Initial Impression / Assessment and Plan / UC Course  I have reviewed the triage vital signs and the nursing notes.  Pertinent labs & imaging results that were available during my care of the patient were reviewed by me and considered in my medical decision making (see chart for details).  Elevated blood pressure reading in office with diagnosis of hypertension Suprapubic pain  Vital signs are stable, blood pressure 150/88 in triage, patient in no signs of distress, S1 and S2 heard to auscultation, EKG showing normal sinus with first-degree heart block, neurological exam without abnormality, at this time unknown etiology of rising blood pressure, as patient has been well controlled on current medication up until 3 days ago, will not make changes at this time, recommended follow-up with PCP within the week for formal evaluation, recommended patient to follow-up for pressure at home and manage headaches with Tylenol, given strict precautions for signs of hypertensive urgency to go to the nearest emergency department for further evaluation and  management  Urinalysis is negative for infection, discussed findings with patient, tenderness is noted over the suprapubic region however patient is in no signs of distress, low suspicion for an acute abdomen, patient endorses that he has chronic constipation due to medications has been taking MiraLAX daily, last bowel movement 3 to 4 days ago, suprapubic pressure possibly related to constipation, discussed with patient, lactulose prescribed to be used 3 days as needed then continued use of MiraLAX, recommend increase fluid intake while using this medications and discontinuation after bowel movement occurs, prescribed Pyridium in addition for additional management of symptoms, has upcoming appointment with urology, encourage patient to keep this appointment Final Clinical Impressions(s) / UC Diagnoses   Final diagnoses:  None   Discharge Instructions   None    ED Prescriptions   None    PDMP not reviewed this encounter.   Hans Eden, NP 03/26/22 1301

## 2022-03-28 ENCOUNTER — Ambulatory Visit (INDEPENDENT_AMBULATORY_CARE_PROVIDER_SITE_OTHER): Payer: Medicare Other | Admitting: Family Medicine

## 2022-03-28 ENCOUNTER — Ambulatory Visit: Payer: Self-pay

## 2022-03-28 ENCOUNTER — Encounter: Payer: Self-pay | Admitting: Family Medicine

## 2022-03-28 VITALS — BP 135/79 | HR 69 | Ht 72.0 in | Wt 205.0 lb

## 2022-03-28 DIAGNOSIS — I10 Essential (primary) hypertension: Secondary | ICD-10-CM

## 2022-03-28 DIAGNOSIS — R301 Vesical tenesmus: Secondary | ICD-10-CM | POA: Diagnosis not present

## 2022-03-28 DIAGNOSIS — N3281 Overactive bladder: Secondary | ICD-10-CM

## 2022-03-28 DIAGNOSIS — N138 Other obstructive and reflux uropathy: Secondary | ICD-10-CM | POA: Diagnosis not present

## 2022-03-28 DIAGNOSIS — N401 Enlarged prostate with lower urinary tract symptoms: Secondary | ICD-10-CM | POA: Diagnosis not present

## 2022-03-28 MED ORDER — MIRABEGRON ER 50 MG PO TB24: 50.0000 mg | ORAL_TABLET | Freq: Every day | ORAL | 1 refills | Status: DC

## 2022-03-28 MED ORDER — TAMSULOSIN HCL 0.4 MG PO CAPS: 0.8000 mg | ORAL_CAPSULE | Freq: Every day | ORAL | 0 refills | Status: DC

## 2022-03-28 NOTE — Patient Instructions (Addendum)
Thank you for coming to the office today.  Tamsulosin flomax re ordered  New medicine Myrbetriq '50mg'$  once per day for bladder dysfunction spasms  Sample ozempic 0.5  Caution with the miralax/lactulose  Hopefully once bowels cleared, and bladder calmed down, that problem will resolve.  BP normal today, likely up temporarily due to the bladder spasms pain.  Please schedule a Follow-up Appointment to: Return if symptoms worsen or fail to improve.  If you have any other questions or concerns, please feel free to call the office or send a message through [facility]. You may also schedule an earlier appointment if necessary.  Additionally, you may be receiving a survey about your experience at our office within a few days to 1 week by e-mail or mail. We value your feedback.  [name], DO [place]

## 2022-03-28 NOTE — Progress Notes (Signed)
Subjective:    Patient ID: Dale [name]., male    DOB: [birth year], 61 y.o.   MRN: [id number]  Dale [name]. is a 61 y.o. male presenting on 03/28/2022 for Hypertension (BP was 181/101 last night, this morning 176/96 with arm cuff, pain on right side of neck and legs feeling heavy )   HPI  Urgent Care FOLLOW-UP VISIT  Hospital/Location: [facility] Date of ED Visit: 03/26/22  Reason for Presenting to ED: Hypertension, elevated  FOLLOW-UP  - ED provider note and record have been reviewed - Patient presents today about 2 days after recent UC  visit. Brief summary of recent course, patient had symptoms of significant headache on 03/25/22, presented to UC on 03/26/22 for high BP he denies any blurred vision or loss, he admits having a "hazy" feeling in his head and facial flushing, reported BP when called triage at 150/100 with facial flushing.  He also reported urinary difficulty with weak stream, difficulty starting void. He had UA tested negative for infection. Has Urology apt 6/1 for BPH obstructive symptoms. It did cause significant abdominal pelvic. Note he was not able to get Tamsulosin refilled but needs this ordered.   Admits one episode of accidental night fecal soiling, did not feel it and he noticed when woke up. On opiate, on miralax 2.5 cap dose was taken recently due to ED constipation diagnosis Not taking lactulose rx by ED yet   - Today reports overall has done well after discharge from ED. Symptoms of BP have improved but still has been elevated at times.  - New medications on discharge: Lactulose, AZO - Changes to current meds on discharge: none   I have reviewed the discharge medication list, and have reconciled the current and discharge medications today.       03/28/2022   10:58 AM 08/06/2021    2:49 PM 06/19/2021   11:50 AM  Depression screen PHQ 2/9  Decreased Interest '2 2 3  '$ Down, Depressed, Hopeless '1 2 3  '$ PHQ - 2 Score '3 4 6  '$ Altered sleeping 0 0  0  Tired, decreased energy '1 1 3  '$ Change in appetite 0 2 0  Feeling bad or failure about yourself  0 0 0  Trouble concentrating 1 0 3  Moving slowly or fidgety/restless 0 0 0  Suicidal thoughts 0 0 0  PHQ-9 Score '5 7 12  '$ Difficult doing work/chores  Very difficult Somewhat difficult    Social History   Tobacco Use   Smoking status: Former    Types: Cigarettes    Start date: 08/29/1979    Quit date: 11/04/2010    Years since quitting: 11.4   Smokeless tobacco: Former    Quit date: 11/19/2010   Tobacco comments:    1 to 1.5 packs per day  Vaping Use   Vaping Use: Never used  Substance Use Topics   Alcohol use: No    Alcohol/week: 0.0 standard drinks   Drug use: No    Review of Systems Per HPI unless specifically indicated above     Objective:    BP 135/79   Pulse 69   Ht 6' (1.829 m)   Wt 205 lb (93 kg)   SpO2 96%   BMI 27.80 kg/m   Wt Readings from Last 3 Encounters:  03/28/22 205 lb (93 kg)  03/26/22 200 lb (90.7 kg)  02/05/22 198 lb (89.8 kg)    Physical Exam Vitals and nursing note reviewed.  Constitutional:  General: He is not in acute distress.    Appearance: He is well-developed. He is not diaphoretic.     Comments: Well-appearing, comfortable, cooperative  HENT:     Head: Normocephalic and atraumatic.  Eyes:     General:        Right eye: No discharge.        Left eye: No discharge.     Conjunctiva/sclera: Conjunctivae normal.  Neck:     Thyroid: No thyromegaly.  Cardiovascular:     Rate and Rhythm: Normal rate and regular rhythm.     Pulses: Normal pulses.     Heart sounds: Normal heart sounds. No murmur heard. Pulmonary:     Effort: Pulmonary effort is normal. No respiratory distress.     Breath sounds: Normal breath sounds. No wheezing or rales.  Musculoskeletal:        General: Normal range of motion.     Cervical back: Normal range of motion and neck supple.  Lymphadenopathy:     Cervical: No cervical adenopathy.  Skin:     General: Skin is warm and dry.     Findings: No erythema or rash.  Neurological:     Mental Status: He is alert and oriented to person, place, and time. Mental status is at baseline.  Psychiatric:        Behavior: Behavior normal.     Comments: Well groomed, good eye contact, normal speech and thoughts      Results for orders placed or performed during the hospital encounter of 03/26/22  Urinalysis, Routine w reflex microscopic Urine, Clean Catch  Result Value Ref Range   Color, Urine YELLOW YELLOW   APPearance CLEAR CLEAR   Specific Gravity, Urine 1.010 1.005 - 1.030   pH 6.5 5.0 - 8.0   Glucose, UA NEGATIVE NEGATIVE mg/dL   Hgb urine dipstick NEGATIVE NEGATIVE   Bilirubin Urine NEGATIVE NEGATIVE   Ketones, ur NEGATIVE NEGATIVE mg/dL   Protein, ur NEGATIVE NEGATIVE mg/dL   Nitrite NEGATIVE NEGATIVE   Leukocytes,Ua NEGATIVE NEGATIVE      Assessment & Plan:   Problem List Items Addressed This Visit     Essential (primary) hypertension - Primary   Other Visit Diagnoses     BPH with obstruction/lower urinary tract symptoms       Relevant Medications   tamsulosin (FLOMAX) 0.4 MG CAPS capsule   Painful bladder spasm       Relevant Medications   mirabegron ER (MYRBETRIQ) 50 MG TB24 tablet   OAB (overactive bladder)       Relevant Medications   mirabegron ER (MYRBETRIQ) 50 MG TB24 tablet       BP normalized today Suspected elevated spikes of BP can be with acute abdominal pelvic pain w/ bladder spasms with associated urinary dysfunction with BPH LUTS  He is OFF Tamsulosin now, needs re order this may be part of problem. Re order Tamsulosin 0.'4mg'$  x 2 daily  New medicine Myrbetriq '50mg'$  once per day for bladder dysfunction spasms, he would not be candidate for anti cholinergic medications  Urology apt 1 week w/ Dr Bernardo Heater at [facility]  T2DM, improved control In donut hole w medicare Sample ozempic 0.5 pen today  Fecal soiling With current pelvic symptoms may be  related. Caution with the miralax/lactulose triggering issue. He has good bowel control / muscle tone during day Hopefully once bowels cleared, and bladder calmed down, that problem will resolve.    Meds ordered this encounter  Medications   mirabegron ER (MYRBETRIQ)  50 MG TB24 tablet    Sig: Take 1 tablet (50 mg total) by mouth daily.    Dispense:  30 tablet    Refill:  1   tamsulosin (FLOMAX) 0.4 MG CAPS capsule    Sig: Take 2 capsules (0.8 mg total) by mouth daily.    Dispense:  180 capsule    Refill:  0      Follow up plan: Return if symptoms worsen or fail to improve.   [name], [facility] 03/28/2022, 11:16 AM

## 2022-03-28 NOTE — Telephone Encounter (Signed)
  Chief Complaint: high BP Symptoms: none today headache yesterday  Frequency: since last night Pertinent Negatives: Patient denies blurred vision, speech, numbness or tingling to face legs or arms Disposition: '[]'$ ED /'[]'$ Urgent Care (no appt availability in office) / '[x]'$ Appointment(In office/virtual)/ '[]'$  [place] [facility]/ '[]'$ Home Care/ '[]'$ Refused Recommended Disposition /'[]'$ [place] [facility]/ '[]'$  Follow-up with PCP Additional Notes: pt accepted appt today with PCP.     Reason for Disposition  Systolic BP  >= 431 OR Diastolic >= 540  Answer Assessment - Initial Assessment Questions 1. BLOOD PRESSURE: "What is the blood pressure?" "Did you take at least two measurements 5 minutes apart?"     181/101 last night  171/96 2. ONSET: "When did you take your blood pressure?"     Last night and this am 3. HOW: "How did you obtain the blood pressure?" (e.g., visiting nurse, automatic home BP monitor)     Automatic BP cuff 4. HISTORY: "Do you have a history of high blood pressure?"     yes 5. MEDICATIONS: "Are you taking any medications for blood pressure?" "Have you missed any doses recently?"     yes 6. OTHER SYMPTOMS: "Do you have any symptoms?" (e.g., headache, chest pain, blurred vision, difficulty breathing, weakness)     Legs feel weak, pain to the right sde of  Protocols used: Blood Pressure - High-A-AH

## 2022-03-31 ENCOUNTER — Other Ambulatory Visit: Payer: Self-pay | Admitting: Family Medicine

## 2022-03-31 DIAGNOSIS — I10 Essential (primary) hypertension: Secondary | ICD-10-CM

## 2022-04-02 NOTE — Telephone Encounter (Signed)
Not on current med list. Requested Prescriptions  Pending Prescriptions Disp Refills  . hydrochlorothiazide (HYDRODIURIL) 12.5 MG tablet [Pharmacy Med Name: HYDROCHLOROTHIAZIDE 12.5 MG TB] 90 tablet 0    Sig: TAKE 1 TABLET BY MOUTH EVERY DAY     Cardiovascular: Diuretics - Thiazide Failed - 03/31/2022  9:32 AM      Failed - Cr in normal range and within 180 days    Creat  Date Value Ref Range Status  07/11/2021 0.88 0.70 - 1.35 mg/dL Final         Failed - K in normal range and within 180 days    Potassium  Date Value Ref Range Status  07/11/2021 3.9 3.5 - 5.3 mmol/L Final  08/20/2013 3.5 3.5 - 5.1 mmol/L Final         Failed - Na in normal range and within 180 days    Sodium  Date Value Ref Range Status  07/11/2021 137 135 - 146 mmol/L Final  11/12/2016 142 134 - 144 mmol/L Final  08/20/2013 140 136 - 145 mmol/L Final         Passed - Last BP in normal range    BP Readings from Last 1 Encounters:  03/28/22 135/79         Passed - Valid encounter within last 6 months    Recent Outpatient Visits          5 days ago Essential (primary) hypertension   [place], DO   1 month ago Type 2 diabetes, controlled, with neuropathy [facility])   [place], DO   7 months ago Annual physical exam   [facility] Olin Hauser, DO   1 year ago Type 2 diabetes, controlled, with neuropathy [facility])   [facility] Olin Hauser, DO   1 year ago Essential (primary) hypertension   [place], DO      Future Appointments            In 2 days Stoioff, Ronda Fairly, MD [place]   In 2 months  [facility], [place]   In 4 months [place], [facility], [place]   In 5 months [place], Ronda Fairly, [facility]

## 2022-04-04 ENCOUNTER — Encounter: Payer: Self-pay | Admitting: Urology

## 2022-04-04 ENCOUNTER — Ambulatory Visit: Payer: Medicare Other | Admitting: Urology

## 2022-04-04 VITALS — BP 106/72 | HR 80 | Ht 72.0 in | Wt 200.0 lb

## 2022-04-04 DIAGNOSIS — E291 Testicular hypofunction: Secondary | ICD-10-CM | POA: Diagnosis not present

## 2022-04-04 DIAGNOSIS — N401 Enlarged prostate with lower urinary tract symptoms: Secondary | ICD-10-CM | POA: Diagnosis not present

## 2022-04-04 DIAGNOSIS — R3911 Hesitancy of micturition: Secondary | ICD-10-CM

## 2022-04-04 LAB — BLADDER SCAN AMB NON-IMAGING: Scan Result: 0

## 2022-04-04 NOTE — Addendum Note (Signed)
Addended by: Alvera Novel on: 04/04/2022 11:48 AM   Modules accepted: Orders

## 2022-04-04 NOTE — Progress Notes (Signed)
04/04/2022 10:56 AM   Dale [name]. Jul 06, 1961 [id number]  Referring provider: Olin Hauser, DO [address] [place],  [place] [address]  Chief Complaint  Patient presents with   Urinary Retention    Urologic history: 1.  Hypogonadism Testosterone cypionate every 2 weeks   2.  BPH with LUTS Tamsulosin 0.4 mg daily  3.  Erectile dysfunction   HPI: 61 y.o. male presents for an acute visit for worsening lower urinary tract symptoms  Urgent care visit last week with worsening frequency, urgency, with weak stream which have been present for approximately 1 month Urinalysis was unremarkable and he was prescribed Pyridium without improvement in symptoms Saw Dr. Parks Ranger 03/28/2022 and was restarted on tamsulosin.  Was also prescribed Myrbetriq.  Significant symptomatic improvement on these medications and doing much better Remains on testosterone cypionate Last labs 03/07/2022: Testosterone 184 ng/dL, hematocrit 50.3.  Blood was drawn just prior to next scheduled injection   PMH: Past Medical History:  Diagnosis Date   ADHD (attention deficit hyperactivity disorder)    Anxiety    Coronary artery disease    Coronary artery disease    Coronary atherosclerosis    Depression    GERD (gastroesophageal reflux disease)    Glaucoma    Headache    migrane   Heart disease    Hyperlipidemia    Hypertension    Hypertriglyceridemia    Hypogonadism in male    MVP (mitral valve prolapse)    Narcolepsy and cataplexy    Non-alcoholic fatty liver disease     Surgical History: Past Surgical History:  Procedure Laterality Date   COLONOSCOPY WITH PROPOFOL N/A 11/18/2018   Procedure: COLONOSCOPY WITH PROPOFOL;  Surgeon: Manya Silvas, MD;  Location: [facility] ENDOSCOPY;  Service: Endoscopy;  Laterality: N/A;   ESOPHAGOGASTRODUODENOSCOPY (EGD) WITH PROPOFOL N/A 11/18/2018   Procedure: ESOPHAGOGASTRODUODENOSCOPY (EGD) WITH PROPOFOL;  Surgeon: Manya Silvas, MD;   Location: [facility] ENDOSCOPY;  Service: Endoscopy;  Laterality: N/A;   pain pump implant and removal Bilateral    Dec. 21 2019   SPINE SURGERY     Multiple surgeries, initial 2001, thoracic disc repair and recurrent revisions.   THROAT SURGERY      Home Medications:  Allergies as of 04/04/2022       Reactions   Benzoin Other (See Comments)   blisters   Doxycycline Hyclate Nausea Only   Morphine Other (See Comments)   Penicillins Other (See Comments)        Medication List        Accurate as of April 04, 2022 10:56 AM. If you have any questions, ask your nurse or doctor.          acetaminophen 500 MG tablet Commonly known as: TYLENOL Take 500 mg by mouth daily as needed.   ALPRAZolam 1 MG tablet Commonly known as: XANAX Take 1 mg by mouth 3 (three) times daily as needed.   aspirin EC 81 MG tablet Take 81 mg by mouth daily.   atorvastatin 10 MG tablet Commonly known as: LIPITOR TAKE 1 TABLET BY MOUTH  DAILY AT 2 PM   b complex vitamins capsule Take by mouth.   buPROPion 300 MG 24 hr tablet Commonly known as: WELLBUTRIN XL Take 1 tablet (300 mg total) by mouth daily.   CINNAMON PO Take by mouth.   Coenzyme Q10 100 MG Tabs Take 100 mg by mouth daily.   cyanocobalamin 1000 MCG tablet Take 1 tablet by mouth daily.  desoximetasone 0.25 % cream Commonly known as: TOPICORT Apply 1 application topically 2 (two) times daily as needed.   diclofenac sodium 1 % Gel Commonly known as: VOLTAREN Apply topically.   dicyclomine 20 MG tablet Commonly known as: BENTYL   DULoxetine 60 MG capsule Commonly known as: CYMBALTA Take 1 capsule (60 mg total) by mouth Two (2) times a day. Morning and early afternoon   Fish Oil 1200 MG Caps Take 1 capsule (1,200 mg total) by mouth daily. What changed: when to take this   gabapentin 300 MG capsule Commonly known as: NEURONTIN Take 3 capsules by mouth 3 (three) times daily.   lactulose 10 GM/15ML solution Commonly known  as: CHRONULAC Take 30 mLs (20 g total) by mouth daily as needed for mild constipation.   lisinopril 10 MG tablet Commonly known as: ZESTRIL Take 1 tablet by mouth daily.   metFORMIN 1000 MG tablet Commonly known as: GLUCOPHAGE TAKE 1 TABLET BY MOUTH  TWICE DAILY WITH MEALS   mirabegron ER 50 MG Tb24 tablet Commonly known as: Myrbetriq Take 1 tablet (50 mg total) by mouth daily.   multivitamin tablet Take 1 tablet by mouth daily.   Ozempic (1 MG/DOSE) 4 MG/3ML Sopn Generic drug: Semaglutide (1 MG/DOSE) Inject 1 mg into the skin once a week.   pantoprazole 40 MG tablet Commonly known as: PROTONIX Take 40 mg by mouth in the morning.   polyethylene glycol 17 g packet Commonly known as: MIRALAX / GLYCOLAX Take 17 g by mouth daily as needed.   propranolol 40 MG tablet Commonly known as: INDERAL TAKE 1 TABLET BY MOUTH THREE TIMES A DAY   tamsulosin 0.4 MG Caps capsule Commonly known as: FLOMAX Take 2 capsules (0.8 mg total) by mouth daily.   testosterone cypionate 200 MG/ML injection Commonly known as: DEPOTESTOSTERONE CYPIONATE INJECT 1 ML (200 MG TOTAL) INTO THE MUSCLE EVERY 14 DAYS   tiZANidine 4 MG tablet Commonly known as: ZANAFLEX 1 tablet twice daily, May take an additional 1/2 tablet at bedtime as needed   traMADol 50 MG tablet Commonly known as: ULTRAM Take 50 mg by mouth every 6 (six) hours as needed.   True Metrix Air Glucose Meter w/Device Kit Check blood sugar 2 times daily   True Metrix Blood Glucose Test test strip Generic drug: glucose blood Check blood sugar 2 times a day        Allergies:  Allergies  Allergen Reactions   Benzoin Other (See Comments)    blisters   Doxycycline Hyclate Nausea Only   Morphine Other (See Comments)   Penicillins Other (See Comments)    Family History: Family History  Problem Relation Age of Onset   Depression Mother    Anxiety disorder Mother    Diabetes Mother    Heart failure Mother    Heart attack  Father    Diabetes Sister    Obesity Sister    Drug abuse Brother    Alcohol abuse Brother    Depression Sister    Anxiety disorder Sister    Drug abuse Sister    Anxiety disorder Sister    Seizures Sister    Diabetes Sister    Depression Sister     Social History:  reports that he quit smoking about 11 years ago. His smoking use included cigarettes. He started smoking about 42 years ago. He quit smokeless tobacco use about 11 years ago. He reports that he does not drink alcohol and does not use drugs.   Physical  Exam: BP 106/72   Pulse 80   Ht 6' (1.829 m)   Wt 200 lb (90.7 kg)   BMI 27.12 kg/m   Constitutional:  Alert and oriented, No acute distress. HEENT: [place] AT, moist mucus membranes.  Trachea midline, no masses. Cardiovascular: No clubbing, cyanosis, or edema. Respiratory: Normal respiratory effort, no increased work of breathing. GU: Prostate 35 g, boggy, smooth without nodules Skin: No rashes, bruises or suspicious lesions. Neurologic: Grossly intact, no focal deficits, moving all 4 extremities. Psychiatric: Normal mood and affect.   Assessment & Plan:    1.  BPH with LUTS Doing much better after restarting tamsulosin PVR today 0 mL States Myrbetriq was over $100 and would recommend he not get refilled and see how his symptoms are on tamsulosin alone  2.   Hypogonadism Stable Scheduled for follow-up October 2023   Abbie Sons, MD  [facility] [address], [place],  [address] [phone]

## 2022-04-05 LAB — URINALYSIS, COMPLETE
Bilirubin, UA: NEGATIVE
Glucose, UA: NEGATIVE
Ketones, UA: NEGATIVE
Leukocytes,UA: NEGATIVE
Nitrite, UA: NEGATIVE
Protein,UA: NEGATIVE
RBC, UA: NEGATIVE
Specific Gravity, UA: 1.01 (ref 1.005–1.030)
Urobilinogen, Ur: 0.2 mg/dL (ref 0.2–1.0)
pH, UA: 6.5 (ref 5.0–7.5)

## 2022-04-05 LAB — MICROSCOPIC EXAMINATION
Bacteria, UA: NONE SEEN
Epithelial Cells (non renal): NONE SEEN /hpf (ref 0–10)

## 2022-04-17 ENCOUNTER — Telehealth: Payer: Medicare Other

## 2022-04-17 ENCOUNTER — Telehealth: Payer: Self-pay | Admitting: Pharmacist

## 2022-04-17 NOTE — Telephone Encounter (Signed)
  Chronic Care Management   Outreach Note  04/17/2022 Name: Dale [name]. MRN: [id number] DOB: [birth year]  Referred by: Olin Hauser, DO Reason for referral : No chief complaint on file.   Was unable to reach patient via telephone today and have left HIPAA compliant voicemail asking patient to return my call. Outreach attempt #2.   Follow Up Plan: Will collaborate with Care Guide to outreach to schedule follow up with me  Wallace Cullens, PharmD, [facility] [phone]

## 2022-04-18 ENCOUNTER — Telehealth: Payer: Self-pay

## 2022-04-18 NOTE — Chronic Care Management (AMB) (Unsigned)
  Chronic Care Management Note  04/18/2022 Name: Dale [name]. MRN: [id number] DOB: 1961-06-03  Dale [name]. is a 61 y.o. year old male who is a primary care patient of Olin Hauser, DO and is actively engaged with the care management team. I reached out to Dale [name]. by phone today to assist with re-scheduling a follow up visit with the Pharmacist  Follow up plan: Unsuccessful telephone outreach attempt made. A HIPAA compliant phone message was left for the patient providing contact information and requesting a return call.  The care management team will reach out to the patient again over the next 7 days.  If patient returns call to provider office, please advise to call [facility]  at [place], [place], [place], [place] [address] Direct Dial: [phone] [email or web address] Website: [email or web address]

## 2022-04-22 NOTE — Chronic Care Management (AMB) (Signed)
  Chronic Care Management Note  04/22/2022 Name: Dale [name]. MRN: [id number] DOB: 1961/07/05  [name] is a 61 y.o. year old male who is a primary care patient of Olin Hauser, DO and is actively engaged with the care management team. I reached out to [name] by phone today to assist with re-scheduling a follow up visit with the Pharmacist  Follow up plan: Unsuccessful telephone outreach attempt made. A HIPAA compliant phone message was left for the patient providing contact information and requesting a return call.  The care management team will reach out to the patient again over the next 7 days.  If patient returns call to provider office, please advise to call [facility]  at [place], [place], [place], [place] [address] Direct Dial: [phone] [email or web address]: [email or web address]

## 2022-05-03 ENCOUNTER — Other Ambulatory Visit: Payer: Self-pay | Admitting: Family Medicine

## 2022-05-03 DIAGNOSIS — N138 Other obstructive and reflux uropathy: Secondary | ICD-10-CM

## 2022-05-06 NOTE — Telephone Encounter (Signed)
Refilled 03/28/2022 #[id number]. Requested Prescriptions  Pending Prescriptions Disp Refills  . tamsulosin (FLOMAX) 0.4 MG CAPS capsule [Pharmacy Med Name: TAMSULOSIN HCL 0.4 MG CAPSULE] 180 capsule 0    Sig: TAKE 2 CAPSULES BY MOUTH [place]     Urology: Alpha-Adrenergic Blocker Passed - 05/03/2022  6:27 PM      Passed - PSA in normal range and within 360 days    PSA  Date Value Ref Range Status  07/13/2019 0.5 < OR = 4.0 ng/mL Final    Comment:    The total PSA value from this assay system is  standardized against the WHO standard. The test  result will be approximately 20% lower when compared  to the equimolar-standardized total PSA (Beckman  Coulter). Comparison of serial PSA results should be  interpreted with this fact in mind. . This test was performed using the Siemens  chemiluminescent method. Values obtained from  different assay methods cannot be used interchangeably. PSA levels, regardless of value, should not be interpreted as absolute evidence of the presence or absence of disease.    Prostate Specific Ag, Serum  Date Value Ref Range Status  08/31/2021 0.5 0.0 - 4.0 ng/mL Final    Comment:    Roche ECLIA methodology. According to the American Urological Association, Serum PSA should decrease and remain at undetectable levels after radical prostatectomy. The AUA defines biochemical recurrence as an initial PSA value 0.2 ng/mL or greater followed by a subsequent confirmatory PSA value 0.2 ng/mL or greater. Values obtained with different assay methods or kits cannot be used interchangeably. Results cannot be interpreted as absolute evidence of the presence or absence of malignant disease.          Passed - Last BP in normal range    BP Readings from Last 1 Encounters:  04/04/22 106/72         Passed - Valid encounter within last 12 months    Recent Outpatient Visits          1 month ago Essential (primary) hypertension   [place], DO   3 months ago Type 2 diabetes, controlled, with neuropathy [facility])   [facility] Olin Hauser, DO   9 months ago Annual physical exam   [facility] Olin Hauser, DO   1 year ago Type 2 diabetes, controlled, with neuropathy [facility])   [facility] Olin Hauser, DO   1 year ago Essential (primary) hypertension   [place], Devonne Doughty, DO      Future Appointments            In 1 month  [facility], [place]   In 3 months [place] [facility], [facility], [place]   In 3 months [place], Ronda Fairly, [facility]

## 2022-05-08 DIAGNOSIS — M25552 Pain in left hip: Secondary | ICD-10-CM | POA: Diagnosis not present

## 2022-05-08 DIAGNOSIS — M1612 Unilateral primary osteoarthritis, left hip: Secondary | ICD-10-CM | POA: Diagnosis not present

## 2022-05-08 DIAGNOSIS — M25462 Effusion, left knee: Secondary | ICD-10-CM | POA: Diagnosis not present

## 2022-05-08 DIAGNOSIS — M79674 Pain in right toe(s): Secondary | ICD-10-CM | POA: Diagnosis not present

## 2022-05-08 DIAGNOSIS — S8992XA Unspecified injury of left lower leg, initial encounter: Secondary | ICD-10-CM | POA: Diagnosis not present

## 2022-05-08 DIAGNOSIS — S92524A Nondisplaced fracture of medial phalanx of right lesser toe(s), initial encounter for closed fracture: Secondary | ICD-10-CM | POA: Diagnosis not present

## 2022-05-08 DIAGNOSIS — S92524D Nondisplaced fracture of medial phalanx of right lesser toe(s), subsequent encounter for fracture with routine healing: Secondary | ICD-10-CM | POA: Diagnosis not present

## 2022-05-16 DIAGNOSIS — S92524A Nondisplaced fracture of medial phalanx of right lesser toe(s), initial encounter for closed fracture: Secondary | ICD-10-CM | POA: Diagnosis not present

## 2022-05-16 DIAGNOSIS — M79674 Pain in right toe(s): Secondary | ICD-10-CM | POA: Diagnosis not present

## 2022-05-16 DIAGNOSIS — E1169 Type 2 diabetes mellitus with other specified complication: Secondary | ICD-10-CM | POA: Diagnosis not present

## 2022-05-16 DIAGNOSIS — G629 Polyneuropathy, unspecified: Secondary | ICD-10-CM | POA: Diagnosis not present

## 2022-05-17 ENCOUNTER — Ambulatory Visit (INDEPENDENT_AMBULATORY_CARE_PROVIDER_SITE_OTHER): Payer: Medicare Other | Admitting: Pharmacist

## 2022-05-17 DIAGNOSIS — I10 Essential (primary) hypertension: Secondary | ICD-10-CM

## 2022-05-17 DIAGNOSIS — E1140 Type 2 diabetes mellitus with diabetic neuropathy, unspecified: Secondary | ICD-10-CM

## 2022-05-17 NOTE — Chronic Care Management (AMB) (Signed)
Chronic Care Management CCM Pharmacy Note  05/17/2022 Name:  Dale [name]. MRN:  [id number] DOB:  1961/09/11   Subjective: Dale Treml. is an 61 y.o. year old male who is a primary patient of Smitty Cords, DO.  The CCM team was consulted for assistance with disease management and care coordination needs.    Engaged with patient by telephone for follow up visit for pharmacy case management and/or care coordination services.   Objective:  Medications Reviewed Today     Reviewed by Manuela Neptune, RPH-CPP (Pharmacist) on 05/17/22 at 1220  Med List Status: <None>   Medication Order Taking? Sig Documenting Provider Last Dose Status Informant  acetaminophen (TYLENOL) 500 MG tablet [id number] Yes Take 500 mg by mouth daily as needed. [provider] Taking Active   ALPRAZolam Prudy Feeler) 1 MG tablet [id number] Yes Take 1 mg by mouth 3 (three) times daily as needed. Jacquelin Hawking, MD Taking Active   aspirin EC 81 MG tablet [id number] Yes Take 81 mg by mouth daily. [provider] Taking Active   atorvastatin (LIPITOR) 10 MG tablet [id number] Yes TAKE 1 TABLET BY MOUTH  DAILY AT 2 PM Karamalegos, Netta Neat, DO Taking Active   b complex vitamins capsule [id number] Yes Take by mouth. [provider] Taking Active   Blood Glucose Monitoring Suppl (TRUE METRIX AIR GLUCOSE METER) w/Device KIT [id number]  Check blood sugar 2 times daily Smitty Cords, DO  Active   buPROPion (WELLBUTRIN XL) 300 MG 24 hr tablet [id number] Yes Take 1 tablet (300 mg total) by mouth daily. Smitty Cords, DO Taking Active   CINNAMON PO [id number] Yes Take by mouth. [provider] Taking Active   Coenzyme Q10 100 MG TABS [id number] Yes Take 100 mg by mouth daily. [provider] Taking Active   cyanocobalamin 1000 MCG tablet [id number] Yes Take 1 tablet by mouth daily. [provider] Taking Active   desoximetasone (TOPICORT)  0.25 % cream [id number]  Apply 1 application topically 2 (two) times daily as needed. Smitty Cords, DO  Active   diclofenac sodium (VOLTAREN) 1 % GEL [id number]  Apply topically. [provider]  Active   dicyclomine (BENTYL) 20 MG tablet [id number] No   Patient not taking: Reported on 05/17/2022   [provider] Not Taking Active            Med Note [facility], [facility] A   Mon Jun 12, 2020  9:15 AM) As needed  DULoxetine (CYMBALTA) 60 MG capsule [id number] Yes Take 1 capsule (60 mg total) by mouth Two (2) times a day. Morning and early afternoon [provider] Taking Active   gabapentin (NEURONTIN) 300 MG capsule [id number] Yes Take 3 capsules by mouth 3 (three) times daily. [provider] Taking Active            Med Note De Nurse, Adorian Gwynne A   Wed Nov 08, 2020  4:56 PM) Reports taking 900 mg in the morning and at noon, and take 1200 mg (4 capsules) nightly as directed by [facility]  lisinopril (ZESTRIL) 10 MG tablet [id number] Yes Take 1 tablet by mouth daily. [provider] Taking Active   metFORMIN (GLUCOPHAGE) 1000 MG tablet [id number] Yes TAKE 1 TABLET BY MOUTH  TWICE DAILY WITH MEALS Karamalegos, Netta Neat, DO Taking Active   Multiple Vitamin (MULTIVITAMIN) tablet [id number] Yes Take 1 tablet by mouth daily. [provider] Taking Active   Omega-3 Fatty Acids (FISH OIL)  1200 MG CAPS [id number] Yes Take 1 capsule (1,200 mg total) by mouth daily.  Patient taking differently: Take 1,200 mg by mouth 2 (two) times daily.   Olin Hauser, DO Taking Active   OZEMPIC, 1 MG/DOSE, 4 MG/3ML SOPN [id number] No Inject 1 mg into the skin once a week.  Patient not taking: Reported on 05/17/2022   Olin Hauser, DO Not Taking Active   pantoprazole (PROTONIX) 40 MG tablet [id number] Yes Take 40 mg by mouth in the morning. [provider] Taking Active   polyethylene glycol (MIRALAX / GLYCOLAX) 17 g  packet [id number] Yes Take 17 g by mouth daily as needed. [provider] Taking Active   propranolol (INDERAL) 40 MG tablet [id number] Yes TAKE 1 TABLET BY MOUTH THREE TIMES A DAY Karamalegos, Alexander Lenna Sciara, DO Taking Active   tamsulosin (FLOMAX) 0.4 MG CAPS capsule [id number] Yes Take 2 capsules (0.8 mg total) by mouth daily. Olin Hauser, DO Taking Active   testosterone cypionate (DEPOTESTOSTERONE CYPIONATE) 200 MG/ML injection [id number] Yes INJECT 1 ML (200 MG TOTAL) INTO THE MUSCLE EVERY 14 DAYS Stoioff, Ronda Fairly, MD Taking Active   tiZANidine (ZANAFLEX) 4 MG tablet [id number] Yes 1 tablet twice daily, May take an additional 1/2 tablet at bedtime as needed [provider] Taking Active   traMADol (ULTRAM) 50 MG tablet [id number] Yes Take 1 tablet (50 mg total) by mouth every eight (8) hours as needed for pain. [provider] Taking Active   TRUE METRIX BLOOD GLUCOSE TEST test strip [id number]  Check blood sugar 2 times a day Olin Hauser, DO  Active             Pertinent Labs:  Lab Results  Component Value Date   HGBA1C 5.5 02/05/2022   Lab Results  Component Value Date   CHOL 114 07/11/2021   HDL 30 (L) 07/11/2021   LDLCALC 54 07/11/2021   TRIG 237 (H) 07/11/2021   CHOLHDL 3.8 07/11/2021   Lab Results  Component Value Date   CREATININE 0.88 07/11/2021   BUN 18 07/11/2021   NA 137 07/11/2021   K 3.9 07/11/2021   CL 97 (L) 07/11/2021   CO2 31 07/11/2021    SDOH:  (Social Determinants of Health) assessments and interventions performed:    [place]  Review of patient past medical history, allergies, medications, health status, including review of consultants reports, laboratory and other test data, was performed as part of comprehensive evaluation and provision of chronic care management services.   Care Plan : General Pharmacy (Adult)  Updates made by [name], RPH-CPP since 05/17/2022 12:00 AM     Problem:  Disease Progression      Long-Range Goal: Disease Progression Prevented or Minimized   Start Date: 11/13/2020  Expected End Date: 02/11/2021  Recent Progress: On track  Priority: High  Note:   Current Barriers:  Financial Barriers in complicated patient with multiple medical conditions including T2DM, HTN, peripheral neuropathy, depression, GERD, tremors and chronic pain; patient has [employer] insurance Has reported Ozempic not affordable when in coverage gap of Part D plan coverage Frustration with pill burden  Pharmacist Clinical Goal(s):  Over the next 90 days, patient will maintain control of blood sugar as evidenced by A1C >6.5% through collaboration with PharmD and provider.   Interventions: 1:1 collaboration with Olin Hauser, DO regarding development and update of comprehensive plan of care as evidenced by provider attestation and co-signature Inter-disciplinary care  team collaboration (see longitudinal plan of care) Perform chart review. Patient seen for Office Visit with [facility] on 7/13 Comprehensive medication review performed; medication list updated in electronic medical record Caution patient for risk of dizziness/sedation with alprazolam, gabapentin, tizanidine and tramadol particularly when these medications taken in combination Patient denies issues with dizziness/sleepiness  Counsel patient to use caution if needs to get up overnight to use restroom Reports now using cane for additional support when gets up overnight Reports no longer taking Myrbetriq due to cost. Also, reports did take Myrbetriq for 1 month, but did not notice an improvement in symptoms Reports recently sometimes waking up overnight after ~5 hours of sleep unable to return to sleep Encourage patient to follow up with [facility] Psychiatrist  to discuss sleep concern at upcoming appointment on 7/18  Type 2 Diabetes: Controlled; current  treatment: metformin 1000 mg twice daily Currently off of Ozempic due to cost of medication. Reports last dose of Ozempic was Ozempic 0.5 mg ~2 weeks ago Reports recent morning fasting blood sugars ranging 90-110 Denies symptoms of hypoglycemia Counsel on importance of spreading meals throughout the day, having well-balanced meals and trying to limit carbohydrates Exercise: has been limited by his foot injury, but following up with Orthopedics Patient to continue to monitor home blood sugar and to contact office if needed for readings outside of established parameters or new symptoms  Hypertension: Patient currently taking: Lisinopril 10 mg daily Propranolol IR $RemoveBefore'40mg'cPiTwNYxPepch$  three times daily Reports tremors well controlled on current dose of propranolol Reports recent home BP readings (upper arm monitor): Today: 101/70, HR 74 Denies symptoms of hypotension since stopped HCTZ Have discussed continuing to take positional changes slowly Patient to continue home BP monitoring, keep log of results, bring record to upcoming appointment with PCP and call providers for readings outside of established parameters, dizziness or new symptoms   Patient Goals/Self-Care Activities Over the next 90 days, patient will:  - take medications as prescribed - check glucose, document, and provide at future appointments - check blood pressure, document, and provide at future appointments  Follow Up Plan: Telephone follow up appointment with care management team member scheduled for: 10/16/2022 at 11:15 am      Wallace Cullens, PharmD, [place], [place] [phone]

## 2022-05-17 NOTE — Patient Instructions (Signed)
Visit Information  Thank you for taking time to visit with me today. Please don't hesitate to contact me if I can be of assistance to you before our next scheduled telephone appointment.  Following are the goals we discussed today:   Goals Addressed             This Visit's Progress    Pharmacy Goals       Our goal A1c is less than 7%. This corresponds with fasting sugars less than 130 and 2 hour after meal sugars less than 180. Please check your blood sugar and keep a log of the results  Please check your blood pressure, keep record of results and bring this record with you to medical appointments.  Our goal bad cholesterol, or LDL, is less than 70 . This is why it is important to continue taking your atorvastatin  Feel free to call me with any questions or concerns. I look forward to our next call!  Wallace Cullens, PharmD, [place], CPP Clinical Pharmacist [facility] [phone]          Our next appointment is by telephone on 10/16/2022 at 11:15 am  Please call the care guide team at [phone] if you need to cancel or reschedule your appointment.    Patient verbalizes understanding of instructions and care plan provided today and agrees to view in [place]. Active MyChart status and patient understanding of how to access instructions and care plan via MyChart confirmed with patient.

## 2022-05-20 ENCOUNTER — Telehealth: Payer: Self-pay | Admitting: Pharmacist

## 2022-05-20 NOTE — Telephone Encounter (Signed)
  Chronic Care Management   Outreach Note  05/20/2022 Name: Dale [name]. MRN: [id number] DOB: June 18, 1961  Referred by: Olin Hauser, DO Reason for referral : No chief complaint on file.  Receive message from clinic advising patient contacted office today requesting a call back from [facility] Pharmacist.   Was unable to reach patient via telephone today and have left HIPAA compliant voicemail asking patient to return my call.    Follow Up Plan: Next telephone appointment with CCM Pharmacist currently scheduled for 10/16/2022 at 11:15 AM  Wallace Cullens, PharmD, [place] [facility] [phone]

## 2022-06-03 DIAGNOSIS — E1140 Type 2 diabetes mellitus with diabetic neuropathy, unspecified: Secondary | ICD-10-CM

## 2022-06-03 DIAGNOSIS — I10 Essential (primary) hypertension: Secondary | ICD-10-CM | POA: Diagnosis not present

## 2022-06-05 ENCOUNTER — Other Ambulatory Visit: Payer: Self-pay | Admitting: Urology

## 2022-06-05 ENCOUNTER — Other Ambulatory Visit: Payer: Self-pay | Admitting: Family Medicine

## 2022-06-05 DIAGNOSIS — G894 Chronic pain syndrome: Secondary | ICD-10-CM | POA: Diagnosis not present

## 2022-06-05 DIAGNOSIS — M961 Postlaminectomy syndrome, not elsewhere classified: Secondary | ICD-10-CM | POA: Diagnosis not present

## 2022-06-05 DIAGNOSIS — M542 Cervicalgia: Secondary | ICD-10-CM | POA: Diagnosis not present

## 2022-06-05 DIAGNOSIS — G629 Polyneuropathy, unspecified: Secondary | ICD-10-CM | POA: Diagnosis not present

## 2022-06-05 DIAGNOSIS — E1140 Type 2 diabetes mellitus with diabetic neuropathy, unspecified: Secondary | ICD-10-CM

## 2022-06-06 MED ORDER — METFORMIN HCL 1000 MG PO TABS: 1000.0000 mg | ORAL_TABLET | Freq: Two times a day (BID) | ORAL | 0 refills | Status: DC

## 2022-06-06 NOTE — Addendum Note (Signed)
Addended by: Erie Noe on: 06/06/2022 11:26 AM   Modules accepted: Orders

## 2022-06-06 NOTE — Telephone Encounter (Signed)
Requested Prescriptions  Pending Prescriptions Disp Refills  . metFORMIN (GLUCOPHAGE) 1000 MG tablet [Pharmacy Med Name: METFORMIN HCL 1,000 MG TABLET] 180 tablet 3    Sig: TAKE 1 TABLET (1,000 MG TOTAL) BY MOUTH 2 (TWO) TIMES DAILY WITH A MEAL.     Endocrinology:  Diabetes - Biguanides Failed - 06/05/2022 11:57 AM      Failed - eGFR in normal range and within 360 days    GFR, Est African American  Date Value Ref Range Status  11/10/2020 111 > OR = 60 mL/min/1.59m2 Final   GFR, Est Non African American  Date Value Ref Range Status  11/10/2020 96 > OR = 60 mL/min/1.28m2 Final         Failed - B12 Level in normal range and within 720 days    Vitamin B-12  Date Value Ref Range Status  07/13/2019 547 200 - 1,100 pg/mL Final         Failed - CBC within normal limits and completed in the last 12 months    WBC  Date Value Ref Range Status  09/12/2021 8.4 4.0 - 10.5 K/uL Final   RBC  Date Value Ref Range Status  09/12/2021 5.50 4.22 - 5.81 MIL/uL Final   Hemoglobin  Date Value Ref Range Status  03/07/2022 16.7 13.0 - 17.7 g/dL Final   Hematocrit  Date Value Ref Range Status  03/07/2022 50.3 37.5 - 51.0 % Final   MCHC  Date Value Ref Range Status  09/12/2021 32.8 30.0 - 36.0 g/dL Final   [facility]  Date Value Ref Range Status  09/12/2021 28.7 26.0 - 34.0 pg Final   MCV  Date Value Ref Range Status  09/12/2021 87.6 80.0 - 100.0 fL Final  06/26/2015 87 79 - 97 fL Final  08/20/2013 89 80 - 100 fL Final   No results found for: "PLTCOUNTKUC", "LABPLAT", "POCPLA" RDW  Date Value Ref Range Status  09/12/2021 15.8 (H) 11.5 - 15.5 % Final  06/26/2015 14.4 12.3 - 15.4 % Final  08/20/2013 14.2 11.5 - 14.5 % Final         Passed - Cr in normal range and within 360 days    Creat  Date Value Ref Range Status  07/11/2021 0.88 0.70 - 1.35 mg/dL Final         Passed - HBA1C is between 0 and 7.9 and within 180 days    Hgb A1c MFr Bld  Date Value Ref Range Status  02/05/2022 5.5  <5.7 % of total Hgb Final    Comment:    For the purpose of screening for the presence of diabetes: . <5.7%       Consistent with the absence of diabetes 5.7-6.4%    Consistent with increased risk for diabetes             (prediabetes) > or =6.5%  Consistent with diabetes . This assay result is consistent with a decreased risk of diabetes. . Currently, no consensus exists regarding use of hemoglobin A1c for diagnosis of diabetes in children. . According to American Diabetes Association (ADA) guidelines, hemoglobin A1c <7.0% represents optimal control in non-pregnant diabetic patients. Different metrics may apply to specific patient populations.  Standards of Medical Care in Diabetes(ADA). Renella Cunas - Valid encounter within last 6 months    Recent Outpatient Visits          2 months ago Essential (primary) hypertension   [facility],  Devonne Doughty, DO   4 months ago Type 2 diabetes, controlled, with neuropathy [facility])   [place], DO   10 months ago Annual physical exam   [facility] Olin Hauser, DO   1 year ago Type 2 diabetes, controlled, with neuropathy [facility])   [facility] Olin Hauser, DO   1 year ago Essential (primary) hypertension   [place], Devonne Doughty, DO      Future Appointments            In 2 weeks  [facility], [place]   In 2 months [place], Devonne Doughty, [facility], [place]   In 2 months [place], Ronda Fairly, [facility]

## 2022-06-20 DIAGNOSIS — M7912 Myalgia of auxiliary muscles, head and neck: Secondary | ICD-10-CM | POA: Diagnosis not present

## 2022-06-20 DIAGNOSIS — M542 Cervicalgia: Secondary | ICD-10-CM | POA: Diagnosis not present

## 2022-06-23 ENCOUNTER — Encounter: Payer: Self-pay | Admitting: Family Medicine

## 2022-06-23 DIAGNOSIS — L309 Dermatitis, unspecified: Secondary | ICD-10-CM

## 2022-06-24 ENCOUNTER — Other Ambulatory Visit: Payer: Self-pay

## 2022-06-24 DIAGNOSIS — L309 Dermatitis, unspecified: Secondary | ICD-10-CM

## 2022-06-24 MED ORDER — DESOXIMETASONE 0.25 % EX CREA: 1.0000 | TOPICAL_CREAM | Freq: Two times a day (BID) | CUTANEOUS | 2 refills | Status: DC | PRN

## 2022-06-25 ENCOUNTER — Ambulatory Visit: Payer: Medicare Other

## 2022-06-27 ENCOUNTER — Other Ambulatory Visit: Payer: Self-pay | Admitting: Family Medicine

## 2022-06-27 DIAGNOSIS — E119 Type 2 diabetes mellitus without complications: Secondary | ICD-10-CM | POA: Diagnosis not present

## 2022-06-27 DIAGNOSIS — M2021 Hallux rigidus, right foot: Secondary | ICD-10-CM | POA: Diagnosis not present

## 2022-06-27 DIAGNOSIS — E1140 Type 2 diabetes mellitus with diabetic neuropathy, unspecified: Secondary | ICD-10-CM

## 2022-06-27 DIAGNOSIS — S92511D Displaced fracture of proximal phalanx of right lesser toe(s), subsequent encounter for fracture with routine healing: Secondary | ICD-10-CM | POA: Diagnosis not present

## 2022-06-27 DIAGNOSIS — E1111 Type 2 diabetes mellitus with ketoacidosis with coma: Secondary | ICD-10-CM | POA: Diagnosis not present

## 2022-06-27 DIAGNOSIS — N138 Other obstructive and reflux uropathy: Secondary | ICD-10-CM

## 2022-06-27 DIAGNOSIS — M2041 Other hammer toe(s) (acquired), right foot: Secondary | ICD-10-CM | POA: Diagnosis not present

## 2022-06-27 NOTE — Telephone Encounter (Signed)
Requested Prescriptions  Pending Prescriptions Disp Refills  . tamsulosin (FLOMAX) 0.4 MG CAPS capsule [Pharmacy Med Name: TAMSULOSIN HCL 0.4 MG CAPSULE] 180 capsule 0    Sig: TAKE 2 CAPSULES BY MOUTH [place]     Urology: Alpha-Adrenergic Blocker Passed - 06/27/2022  5:31 AM      Passed - PSA in normal range and within 360 days    PSA  Date Value Ref Range Status  07/13/2019 0.5 < OR = 4.0 ng/mL Final    Comment:    The total PSA value from this assay system is  standardized against the WHO standard. The test  result will be approximately 20% lower when compared  to the equimolar-standardized total PSA (Beckman  Coulter). Comparison of serial PSA results should be  interpreted with this fact in mind. . This test was performed using the Siemens  chemiluminescent method. Values obtained from  different assay methods cannot be used interchangeably. PSA levels, regardless of value, should not be interpreted as absolute evidence of the presence or absence of disease.    Prostate Specific Ag, Serum  Date Value Ref Range Status  08/31/2021 0.5 0.0 - 4.0 ng/mL Final    Comment:    Roche ECLIA methodology. According to the American Urological Association, Serum PSA should decrease and remain at undetectable levels after radical prostatectomy. The AUA defines biochemical recurrence as an initial PSA value 0.2 ng/mL or greater followed by a subsequent confirmatory PSA value 0.2 ng/mL or greater. Values obtained with different assay methods or kits cannot be used interchangeably. Results cannot be interpreted as absolute evidence of the presence or absence of malignant disease.          Passed - Last BP in normal range    BP Readings from Last 1 Encounters:  04/04/22 106/72         Passed - Valid encounter within last 12 months    Recent Outpatient Visits          3 months ago Essential (primary) hypertension   [place], DO   4  months ago Type 2 diabetes, controlled, with neuropathy [facility]   [facility], Devonne Doughty, DO   10 months ago Annual physical exam   [facility] Olin Hauser, DO   1 year ago Type 2 diabetes, controlled, with neuropathy [facility])   [facility] Olin Hauser, DO   1 year ago Essential (primary) hypertension   [place], Devonne Doughty, DO      Future Appointments            Tomorrow  [facility], [place]   In 1 month [facility], [place]   In 2 months [place], Ronda Fairly, [place]           . metFORMIN (GLUCOPHAGE) 1000 MG tablet [Pharmacy Med Name: METFORMIN HCL 1,000 MG TABLET] 180 tablet 0    Sig: TAKE 1 TABLET (1,000 MG TOTAL) BY MOUTH TWICE A DAY WITH FOOD     Endocrinology:  Diabetes - Biguanides Failed - 06/27/2022  5:31 AM      Failed - eGFR in normal range and within 360 days    GFR, Est African American  Date Value Ref Range Status  11/10/2020 111 > OR = 60 mL/min/1.51m Final   GFR, Est Non African American  Date Value Ref Range Status  11/10/2020  96 > OR = 60 mL/min/1.13m Final         Failed - B12 Level in normal range and within 720 days    Vitamin B-12  Date Value Ref Range Status  07/13/2019 547 200 - 1,100 pg/mL Final         Failed - CBC within normal limits and completed in the last 12 months    WBC  Date Value Ref Range Status  09/12/2021 8.4 4.0 - 10.5 K/uL Final   RBC  Date Value Ref Range Status  09/12/2021 5.50 4.22 - 5.81 MIL/uL Final   Hemoglobin  Date Value Ref Range Status  03/07/2022 16.7 13.0 - 17.7 g/dL Final   Hematocrit  Date Value Ref Range Status  03/07/2022 50.3 37.5 - 51.0 % Final   MCHC  Date Value Ref Range Status  09/12/2021 32.8 30.0 - 36.0 g/dL Final   [facility] Date Value Ref Range Status  09/12/2021 28.7 26.0 - 34.0 pg Final   MCV   Date Value Ref Range Status  09/12/2021 87.6 80.0 - 100.0 fL Final  06/26/2015 87 79 - 97 fL Final  08/20/2013 89 80 - 100 fL Final   No results found for: "PLTCOUNTKUC", "LABPLAT", "POCPLA" RDW  Date Value Ref Range Status  09/12/2021 15.8 (H) 11.5 - 15.5 % Final  06/26/2015 14.4 12.3 - 15.4 % Final  08/20/2013 14.2 11.5 - 14.5 % Final         Passed - Cr in normal range and within 360 days    Creat  Date Value Ref Range Status  07/11/2021 0.88 0.70 - 1.35 mg/dL Final         Passed - HBA1C is between 0 and 7.9 and within 180 days    Hgb A1c MFr Bld  Date Value Ref Range Status  02/05/2022 5.5 <5.7 % of total Hgb Final    Comment:    For the purpose of screening for the presence of diabetes: . <5.7%       Consistent with the absence of diabetes 5.7-6.4%    Consistent with increased risk for diabetes             (prediabetes) > or =6.5%  Consistent with diabetes . This assay result is consistent with a decreased risk of diabetes. . Currently, no consensus exists regarding use of hemoglobin A1c for diagnosis of diabetes in children. . According to American Diabetes Association (ADA) guidelines, hemoglobin A1c <7.0% represents optimal control in non-pregnant diabetic patients. Different metrics may apply to specific patient populations.  Standards of Medical Care in Diabetes(ADA). .Renella Cunas- Valid encounter within last 6 months    Recent Outpatient Visits          3 months ago Essential (primary) hypertension   [place]   4 months ago Type 2 diabetes, controlled, with neuropathy [facility]   [facility]   10 months ago Annual physical exam   [facility]   1 year ago Type 2 diabetes, controlled, with neuropathy [facility]   [facility]   1 year ago Essential (primary) hypertension    [place]      Future Appointments            [facility]  In 1 month [place], Devonne Doughty, DO [facility], [place]   In 2 months [place], Ronda Fairly, [facility]

## 2022-06-27 NOTE — Telephone Encounter (Signed)
Requested medication (s) are due for refill today: no  Requested medication (s) are on the active medication list:yes  Last refill:  06/07/22  Future visit scheduled: yes  Notes to clinic:  Unable to refill per protocol, last refill by provider 06/07/22, request is too soon last refill was for 90, 0 RF     Requested Prescriptions  Pending Prescriptions Disp Refills   metFORMIN (GLUCOPHAGE) 1000 MG tablet [Pharmacy Med Name: METFORMIN HCL 1,000 MG TABLET] 180 tablet 0    Sig: TAKE 1 TABLET (1,000 MG TOTAL) BY MOUTH TWICE A DAY WITH FOOD     Endocrinology:  Diabetes - Biguanides Failed - 06/27/2022  5:31 AM      Failed - eGFR in normal range and within 360 days    GFR, Est African American  Date Value Ref Range Status  11/10/2020 111 > OR = 60 mL/min/1.44m Final   GFR, Est Non African American  Date Value Ref Range Status  11/10/2020 96 > OR = 60 mL/min/1.731mFinal         Failed - B12 Level in normal range and within 720 days    Vitamin B-12  Date Value Ref Range Status  07/13/2019 547 200 - 1,100 pg/mL Final         Failed - CBC within normal limits and completed in the last 12 months    WBC  Date Value Ref Range Status  09/12/2021 8.4 4.0 - 10.5 K/uL Final   RBC  Date Value Ref Range Status  09/12/2021 5.50 4.22 - 5.81 MIL/uL Final   Hemoglobin  Date Value Ref Range Status  03/07/2022 16.7 13.0 - 17.7 g/dL Final   Hematocrit  Date Value Ref Range Status  03/07/2022 50.3 37.5 - 51.0 % Final   MCHC  Date Value Ref Range Status  09/12/2021 32.8 30.0 - 36.0 g/dL Final   [facility]  09/12/2021 28.7 26.0 - 34.0 pg Final   MCV  Date Value Ref Range Status  09/12/2021 87.6 80.0 - 100.0 fL Final  06/26/2015 87 79 - 97 fL Final  08/20/2013 89 80 - 100 fL Final   No results found for: "PLTCOUNTKUC", "LABPLAT", "POCPLA" RDW  Date Value Ref Range Status  09/12/2021 15.8 (H) 11.5 - 15.5 % Final  06/26/2015 14.4 12.3 - 15.4 % Final   08/20/2013 14.2 11.5 - 14.5 % Final         Passed - Cr in normal range and within 360 days    Creat  Date Value Ref Range Status  07/11/2021 0.88 0.70 - 1.35 mg/dL Final         Passed - HBA1C is between 0 and 7.9 and within 180 days    Hgb A1c MFr Bld  Date Value Ref Range Status  02/05/2022 5.5 <5.7 % of total Hgb Final    Comment:    For the purpose of screening for the presence of diabetes: . <5.7%       Consistent with the absence of diabetes 5.7-6.4%    Consistent with increased risk for diabetes             (prediabetes) > or =6.5%  Consistent with diabetes . This assay result is consistent with a decreased risk of diabetes. . Currently, no consensus exists regarding use of hemoglobin A1c for diagnosis of diabetes in children. . According to American Diabetes Association (ADA) guidelines, hemoglobin A1c <7.0% represents optimal control in non-pregnant diabetic patients. Different metrics may  apply to specific patient populations.  Standards of Medical Care in Diabetes(ADA). Renella Cunas - Valid encounter within last 6 months    Recent Outpatient Visits           3 months ago Essential (primary) hypertension   [facility] [place], Devonne Doughty, DO   4 months ago Type 2 diabetes, controlled, with neuropathy [facility])   [facility], Devonne Doughty, DO   10 months ago Annual physical exam   [facility] Olin Hauser, DO   1 year ago Type 2 diabetes, controlled, with neuropathy [facility])   [facility] O[facility], DO   1 year ago Essential (primary) hypertension   [place], Devonne Doughty, DO       Future Appointments             Tomorrow  [facility], [place]   In 1 month [place], Devonne Doughty, DO [facility], [facility]   In 2 months [place], Ronda Fairly, MD [facility]             Signed Prescriptions Disp Refills   tamsulosin (FLOMAX) 0.4 MG CAPS capsule 180 capsule 0    Sig: TAKE 2 CAPSULES BY MOUTH EVERY DAY     Urology: Alpha-Adrenergic Blocker Passed - 06/27/2022  5:31 AM      Passed - PSA in normal range and within 360 days    PSA  Date Value Ref Range Status  07/13/2019 0.5 < OR = 4.0 ng/mL Final    Comment:    The total PSA value from this assay system is  standardized against the WHO standard. The test  result will be approximately 20% lower when compared  to the equimolar-standardized total PSA (Beckman  Coulter). Comparison of serial PSA results should be  interpreted with this fact in mind. . This test was performed using the Siemens  chemiluminescent method. Values obtained from  different assay methods cannot be used interchangeably. PSA levels, regardless of value, should not be interpreted as absolute evidence of the presence or absence of disease.    Prostate Specific Ag, Serum  Date Value Ref Range Status  08/31/2021 0.5 0.0 - 4.0 ng/mL Final    Comment:    Roche ECLIA methodology. According to the American Urological Association, Serum PSA should decrease and remain at undetectable levels after radical prostatectomy. The AUA defines biochemical recurrence as an initial PSA value 0.2 ng/mL or greater followed by a subsequent confirmatory PSA value 0.2 ng/mL or greater. Values obtained with different assay methods or kits cannot be used interchangeably. Results cannot be interpreted as absolute evidence of the presence or absence of malignant disease.          Passed - Last BP in normal range    BP Readings from Last 1 Encounters:  04/04/22 106/72         Passed - Valid encounter within last 12 months    Recent Outpatient Visits           3 months ago Essential (primary) hypertension   [place], DO   4 months ago Type 2 diabetes, controlled, with neuropathy [facility])   [facility] Olin Hauser, DO   10 months ago Annual physical exam   [facility] Olin Hauser, DO   1 year ago  Type 2 diabetes, controlled, with neuropathy [facility])   [facility] Olin Hauser, DO   1 year ago Essential (primary) hypertension   [place], DO       Future Appointments             [facility], [place]   In 1 month [facility], [place]   In 2 months [place], Ronda Fairly, [facility]

## 2022-06-28 ENCOUNTER — Ambulatory Visit (INDEPENDENT_AMBULATORY_CARE_PROVIDER_SITE_OTHER): Payer: Medicare Other

## 2022-06-28 VITALS — BP 134/70 | Ht 72.0 in | Wt 207.0 lb

## 2022-06-28 DIAGNOSIS — Z0000 Encounter for general adult medical examination without abnormal findings: Secondary | ICD-10-CM | POA: Diagnosis not present

## 2022-06-28 NOTE — Patient Instructions (Signed)
Dale [name] , Thank you for taking time to come for your Medicare Wellness Visit. I appreciate your ongoing commitment to your health goals. Please review the following plan we discussed and let me know if I can assist you in the future.   Screening recommendations/referrals: Colonoscopy: has appointment 9/20 Recommended yearly ophthalmology/optometry visit for glaucoma screening and checkup Recommended yearly dental visit for hygiene and checkup  Vaccinations: Influenza vaccine: 08/06/21 Pneumococcal vaccine: 07/18/16 Tdap vaccine: 06/02/19 Shingles vaccine: n/d   Covid-19: 01/25/20, 02/04/20, 09/12/20, 08/20/21  Advanced directives: no  Conditions/risks identified: none  Next appointment: Follow up in one year for your annual wellness visit. 07/04/23 @ 9:30 am in person  Preventive Care 62 Years and Older, Male Preventive care refers to lifestyle choices and visits with your health care provider that can promote health and wellness. What does preventive care include? A yearly physical exam. This is also called an annual well check. Dental exams once or twice a year. Routine eye exams. Ask your health care provider how often you should have your eyes checked. Personal lifestyle choices, including: Daily care of your teeth and gums. Regular physical activity. Eating a healthy diet. Avoiding tobacco and drug use. Limiting alcohol use. Practicing safe sex. Taking low doses of aspirin every day. Taking vitamin and mineral supplements as recommended by your health care provider. What happens during an annual well check? The services and screenings done by your health care provider during your annual well check will depend on your age, overall health, lifestyle risk factors, and family history of disease. Counseling  Your health care provider may ask you questions about your: Alcohol use. Tobacco use. Drug use. Emotional well-being. Home and relationship well-being. Sexual  activity. Eating habits. History of falls. Memory and ability to understand (cognition). Work and work Statistician. Screening  You may have the following tests or measurements: Height, weight, and BMI. Blood pressure. Lipid and cholesterol levels. These may be checked every 5 years, or more frequently if you are over 73 years old. Skin check. Lung cancer screening. You may have this screening every year starting at age 34 if you have a 30-pack-year history of smoking and currently smoke or have quit within the past 15 years. Fecal occult blood test (FOBT) of the stool. You may have this test every year starting at age 32. Flexible sigmoidoscopy or colonoscopy. You may have a sigmoidoscopy every 5 years or a colonoscopy every 10 years starting at age 17. Prostate cancer screening. Recommendations will vary depending on your family history and other risks. Hepatitis C blood test. Hepatitis B blood test. Sexually transmitted disease (STD) testing. Diabetes screening. This is done by checking your blood sugar (glucose) after you have not eaten for a while (fasting). You may have this done every 1-3 years. Abdominal aortic aneurysm (AAA) screening. You may need this if you are a current or former smoker. Osteoporosis. You may be screened starting at age 78 if you are at high risk. Talk with your health care provider about your test results, treatment options, and if necessary, the need for more tests. Vaccines  Your health care provider may recommend certain vaccines, such as: Influenza vaccine. This is recommended every year. Tetanus, diphtheria, and acellular pertussis (Tdap, Td) vaccine. You may need a Td booster every 10 years. Zoster vaccine. You may need this after age 28. Pneumococcal 13-valent conjugate (PCV13) vaccine. One dose is recommended after age 55. Pneumococcal polysaccharide (PPSV23) vaccine. One dose is recommended after age 68. Talk to  your health care provider about which  screenings and vaccines you need and how often you need them. This information is not intended to replace advice given to you by your health care provider. Make sure you discuss any questions you have with your health care provider. Document Released: 11/17/2015 Document Revised: 07/10/2016 Document Reviewed: 08/22/2015 Elsevier Interactive Patient Education  2017 [place] Prevention in the Home Falls can cause injuries. They can happen to people of all ages. There are many things you can do to make your home safe and to help prevent falls. What can I do on the outside of my home? Regularly fix the edges of walkways and driveways and fix any cracks. Remove anything that might make you trip as you walk through a door, such as a raised step or threshold. Trim any bushes or trees on the path to your home. Use bright outdoor lighting. Clear any walking paths of anything that might make someone trip, such as rocks or tools. Regularly check to see if handrails are loose or broken. Make sure that both sides of any steps have handrails. Any raised decks and porches should have guardrails on the edges. Have any leaves, snow, or ice cleared regularly. Use sand or salt on walking paths during winter. Clean up any spills in your garage right away. This includes oil or grease spills. What can I do in the bathroom? Use night lights. Install grab bars by the toilet and in the tub and shower. Do not use towel bars as grab bars. Use non-skid mats or decals in the tub or shower. If you need to sit down in the shower, use a plastic, non-slip stool. Keep the floor dry. Clean up any water that spills on the floor as soon as it happens. Remove soap buildup in the tub or shower regularly. Attach bath mats securely with double-sided non-slip rug tape. Do not have throw rugs and other things on the floor that can make you trip. What can I do in the bedroom? Use night lights. Make sure that you have a  light by your bed that is easy to reach. Do not use any sheets or blankets that are too big for your bed. They should not hang down onto the floor. Have a firm chair that has side arms. You can use this for support while you get dressed. Do not have throw rugs and other things on the floor that can make you trip. What can I do in the kitchen? Clean up any spills right away. Avoid walking on wet floors. Keep items that you use a lot in easy-to-reach places. If you need to reach something above you, use a strong step stool that has a grab bar. Keep electrical cords out of the way. Do not use floor polish or wax that makes floors slippery. If you must use wax, use non-skid floor wax. Do not have throw rugs and other things on the floor that can make you trip. What can I do with my stairs? Do not leave any items on the stairs. Make sure that there are handrails on both sides of the stairs and use them. Fix handrails that are broken or loose. Make sure that handrails are as long as the stairways. Check any carpeting to make sure that it is firmly attached to the stairs. Fix any carpet that is loose or worn. Avoid having throw rugs at the top or bottom of the stairs. If you do have throw rugs, attach  them to the floor with carpet tape. Make sure that you have a light switch at the top of the stairs and the bottom of the stairs. If you do not have them, ask someone to add them for you. What else can I do to help prevent falls? Wear shoes that: Do not have high heels. Have rubber bottoms. Are comfortable and fit you well. Are closed at the toe. Do not wear sandals. If you use a stepladder: Make sure that it is fully opened. Do not climb a closed stepladder. Make sure that both sides of the stepladder are locked into place. Ask someone to hold it for you, if possible. Clearly mark and make sure that you can see: Any grab bars or handrails. First and last steps. Where the edge of each step  is. Use tools that help you move around (mobility aids) if they are needed. These include: Canes. Walkers. Scooters. Crutches. Turn on the lights when you go into a dark area. Replace any light bulbs as soon as they burn out. Set up your furniture so you have a clear path. Avoid moving your furniture around. If any of your floors are uneven, fix them. If there are any pets around you, be aware of where they are. Review your medicines with your doctor. Some medicines can make you feel dizzy. This can increase your chance of falling. Ask your doctor what other things that you can do to help prevent falls. This information is not intended to replace advice given to you by your health care provider. Make sure you discuss any questions you have with your health care provider. Document Released: 08/17/2009 Document Revised: 03/28/2016 Document Reviewed: 11/25/2014 Elsevier Interactive Patient Education  2017 [employer].

## 2022-06-28 NOTE — Progress Notes (Signed)
Subjective:   Dale Petrak. is a 61 y.o. male who presents for Medicare Annual/Subsequent preventive examination.  Review of Systems           Objective:    Today's Vitals   06/28/22 1100  BP: 134/70  Weight: 207 lb (93.9 kg)  Height: 6' (1.829 m)   Body mass index is 28.07 kg/m.     03/26/2022   12:07 PM 09/12/2021   10:52 AM 06/19/2021   11:48 AM 11/18/2018    6:54 AM 10/10/2017    2:45 PM 01/20/2017    3:18 PM 10/23/2016   10:17 AM  Advanced Directives  Does Patient Have a Medical Advance Directive? _0     Would patient like information on creating a medical advance directive?  No - Patient declined  No - Patient declined        Information is confidential and restricted. Go to Review Flowsheets to unlock data.    Current Medications (verified) Outpatient Encounter Medications as of 06/28/2022  Medication Sig   acetaminophen (TYLENOL) 500 MG tablet Take 500 mg by mouth daily as needed.   ALPRAZolam (XANAX) 1 MG tablet Take 1 mg by mouth 3 (three) times daily as needed.   amLODipine (NORVASC) 10 MG tablet    aspirin EC 81 MG tablet Take 81 mg by mouth daily.   atorvastatin (LIPITOR) 10 MG tablet TAKE 1 TABLET BY MOUTH  DAILY AT 2 PM   b complex vitamins capsule Take by mouth.   Blood Glucose Monitoring Suppl (TRUE METRIX AIR GLUCOSE METER) w/Device KIT Check blood sugar 2 times daily   buPROPion (WELLBUTRIN XL) 300 MG 24 hr tablet Take 1 tablet (300 mg total) by mouth daily.   CINNAMON PO Take by mouth.   Coenzyme Q10 100 MG TABS Take 100 mg by mouth daily.   cyanocobalamin 1000 MCG tablet Take 1 tablet by mouth daily.   desoximetasone (TOPICORT) 0.25 % cream Apply 1 Application topically 2 (two) times daily as needed.   diclofenac sodium (VOLTAREN) 1 % GEL Apply topically.   dicyclomine (BENTYL) 20 MG tablet    DULoxetine (CYMBALTA) 60 MG capsule Take 1 capsule (60 mg total) by mouth Two (2) times a day. Morning and early afternoon   gabapentin  (NEURONTIN) 300 MG capsule Take 3 capsules by mouth 3 (three) times daily.   Glucosamine-Chondroitin 250-200 MG TABS Take by mouth.   hydrochlorothiazide (HYDRODIURIL) 25 MG tablet    hydrOXYzine (ATARAX) 25 MG tablet Take 25 mg by mouth 2 (two) times daily as needed.   ibuprofen (ADVIL) 800 MG tablet take One tab PO TID with food   lisinopril (ZESTRIL) 10 MG tablet Take 1 tablet by mouth daily.   meloxicam (MOBIC) 7.5 MG tablet Take by mouth.   metFORMIN (GLUCOPHAGE) 1000 MG tablet Take 1 tablet (1,000 mg total) by mouth 2 (two) times daily with a meal.   Multiple Vitamin (MULTIVITAMIN) tablet Take 1 tablet by mouth daily.   Omega-3 Fatty Acids (FISH OIL) 1200 MG CAPS Take 1 capsule (1,200 mg total) by mouth daily. (Patient taking differently: Take 1,200 mg by mouth 2 (two) times daily.)   OZEMPIC, 1 MG/DOSE, 4 MG/3ML SOPN Inject 1 mg into the skin once a week.   pantoprazole (PROTONIX) 40 MG tablet Take 40 mg by mouth in the morning.   polyethylene glycol (MIRALAX / GLYCOLAX) 17 g packet Take 17 g by mouth daily as needed.   propranolol (INDERAL) 40 MG  tablet TAKE 1 TABLET BY MOUTH THREE TIMES A DAY   QUEtiapine (SEROQUEL) 25 MG tablet Take by mouth.   Sod Fluoride-Potassium Nitrate (SODIUM FLUORIDE 5000 SENSITIVE) 1.1-5 % GEL BRUSH TEETH WITH PASTE FOR 2 MINUTES DAILY BEFORE BED   tamsulosin (FLOMAX) 0.4 MG CAPS capsule TAKE 2 CAPSULES BY MOUTH EVERY DAY   testosterone cypionate (DEPOTESTOSTERONE CYPIONATE) 200 MG/ML injection INJECT 1 ML (200 MG TOTAL) INTO THE MUSCLE EVERY 14 DAYS   tiZANidine (ZANAFLEX) 2 MG tablet Take 2 mg by mouth at bedtime as needed.   tiZANidine (ZANAFLEX) 4 MG tablet 1 tablet twice daily, May take an additional 1/2 tablet at bedtime as needed   traMADol (ULTRAM) 50 MG tablet Take 1 tablet (50 mg total) by mouth every eight (8) hours as needed for pain.   TRUE METRIX BLOOD GLUCOSE TEST test strip Check blood sugar 2 times a day   [DISCONTINUED] ALPRAZolam (XANAX) 1  MG tablet Take by mouth.   [DISCONTINUED] buPROPion (WELLBUTRIN XL) 300 MG 24 hr tablet Take by mouth.   [DISCONTINUED] DULoxetine (CYMBALTA) 60 MG capsule Take by mouth.   [DISCONTINUED] tiZANidine (ZANAFLEX) 4 MG tablet Take by mouth.   [DISCONTINUED] traMADol (ULTRAM) 50 MG tablet Take by mouth.   No facility-administered encounter medications on file as of 06/28/2022.    Allergies (verified) Benzoin, Doxycycline hyclate, Morphine, and Penicillins   History: Past Medical History:  Diagnosis Date   ADHD (attention deficit hyperactivity disorder)    Anxiety    Coronary artery disease    Coronary artery disease    Coronary atherosclerosis    Depression    GERD (gastroesophageal reflux disease)    Glaucoma    Headache    migrane   Heart disease    Hyperlipidemia    Hypertension    Hypertriglyceridemia    Hypogonadism in male    MVP (mitral valve prolapse)    Narcolepsy and cataplexy    Non-alcoholic fatty liver disease    Past Surgical History:  Procedure Laterality Date   COLONOSCOPY WITH PROPOFOL N/A 11/18/2018   Procedure: COLONOSCOPY WITH PROPOFOL;  Surgeon: Elliott, Robert T, MD;  Location: [facility] ENDOSCOPY;  Service: Endoscopy;  Laterality: N/A;   ESOPHAGOGASTRODUODENOSCOPY (EGD) WITH PROPOFOL N/A 11/18/2018   Procedure: ESOPHAGOGASTRODUODENOSCOPY (EGD) WITH PROPOFOL;  Surgeon: Elliott, Robert T, MD;  Location: [facility] ENDOSCOPY;  Service: Endoscopy;  Laterality: N/A;   pain pump implant and removal Bilateral    Dec. 21 2019   SPINE SURGERY     Multiple surgeries, initial 2001, thoracic disc repair and recurrent revisions.   THROAT SURGERY     Family History  Problem Relation Age of Onset   Depression Mother    Anxiety disorder Mother    Diabetes Mother    Heart failure Mother    Heart attack Father    Diabetes Sister    Obesity Sister    Drug abuse Brother    Alcohol abuse Brother    Depression Sister    Anxiety disorder Sister    Drug abuse Sister    Anxiety  disorder Sister    Seizures Sister    Diabetes Sister    Depression Sister    Social History   Socioeconomic History   Marital status: Married    Spouse name: Not on file   Number of children: Not on file   Years of education: 1 yr of college   Highest education level: Not on file  Occupational History   Occupation: Disabled  Tobacco Use     Smoking status: Former    Types: Cigarettes    Start date: 08/29/1979    Quit date: 11/04/2010    Years since quitting: 11.6   Smokeless tobacco: Former    Quit date: 11/19/2010   Tobacco comments:    1 to 1.5 packs per day  Vaping Use   Vaping Use: Never used  Substance and Sexual Activity   Alcohol use: No    Alcohol/week: 0.0 standard drinks of alcohol   Drug use: No   Sexual activity: Not Currently  Other Topics Concern   Not on file  Social History Narrative   Not on file   Social Determinants of Health   Financial Resource Strain: Low Risk  (06/19/2021)   Overall Financial Resource Strain (CARDIA)    Difficulty of Paying Living Expenses: Not hard at all  Food Insecurity: No Food Insecurity (06/19/2021)   Hunger Vital Sign    Worried About Running Out of Food in the Last Year: Never true    Ran Out of Food in the Last Year: Never true  Transportation Needs: No Transportation Needs (06/19/2021)   PRAPARE - Transportation    Lack of Transportation (Medical): No    Lack of Transportation (Non-Medical): No  Physical Activity: Insufficiently Active (06/28/2022)   Exercise Vital Sign    Days of Exercise per Week: 2 days    Minutes of Exercise per Session: 20 min  Stress: No Stress Concern Present (06/28/2022)   [employer] - Occupational Stress Questionnaire    Feeling of Stress : Not at all  Social Connections: Not on file    Tobacco Counseling Counseling given: Not Answered Tobacco comments: 1 to 1.5 packs per day   Clinical Intake:  Pre-visit preparation completed: Yes  Pain : No/denies  pain     Nutritional Risks: None Diabetes: Yes CBG done?: No Did pt. bring in CBG monitor from home?: No  How often do you need to have someone help you when you read instructions, pamphlets, or other written materials from your doctor or pharmacy?: 1 - Never  Diabetic?yes Nutrition Risk Assessment:  Has the patient had any N/V/D within the last 2 months?  Yes  Does the patient have any non-healing wounds?  No  Has the patient had any unintentional weight loss or weight gain?  No   Diabetes:  Is the patient diabetic?  Yes  If diabetic, was a CBG obtained today?  No  Did the patient bring in their glucometer from home?  No  How often do you monitor your CBG's? Once or twice per day.   Financial Strains and Diabetes Management:  Are you having any financial strains with the device, your supplies or your medication? No .  Does the patient want to be seen by Chronic Care Management for management of their diabetes?  No  Would the patient like to be referred to a Nutritionist or for Diabetic Management?  No   Diabetic Exams:  Diabetic Eye Exam: Completed Oct. 2022. . Pt has been advised about the importance in completing this exam.  Diabetic Foot Exam: Completed 06/27/22. Pt has been advised about the importance in completing this exam.   Interpreter Needed?: No  Information entered by ::  , LPN   Activities of Daily Living    03/28/2022   10:58 AM  In your present state of health, do you have any difficulty performing the following activities:  Hearing? 0  Vision? 0  Difficulty concentrating or making decisions? 1    Walking or climbing stairs? 0  Dressing or bathing? 0  Doing errands, shopping? 0    Patient Care Team: Olin Hauser, DO as PCP - General (Family Medicine) Curley Spice, Virl Diamond, RPH-CPP as Pharmacist  Indicate any recent Medical Services you may have received from other than [facility] providers in the past year (date may be  approximate).     Assessment:   This is a routine wellness examination for Dale [name].  Hearing/Vision screen No results found.  Dietary issues and exercise activities discussed:     Goals Addressed             This Visit's Progress    DIET - EAT MORE FRUITS AND VEGETABLES         Depression Screen    06/28/2022   11:04 AM 03/28/2022   10:58 AM 08/06/2021    2:49 PM 06/19/2021   11:50 AM 07/25/2020   11:45 AM 01/19/2020   12:32 PM 07/20/2019   10:18 AM  PHQ 2/9 Scores  PHQ - 2 Score 0 _0 0  PHQ- 9 Score 0 _1 Fall Risk    03/28/2022   10:58 AM 08/06/2021    2:48 PM 06/19/2021   11:49 AM 02/06/2021   11:02 AM 11/01/2020   11:17 AM  Fall Risk   Falls in the past year? _2 0  Comment   tripping on own feet    Number falls in past yr: _3 0  Injury with Fall? 1 0 0 1 0  Risk for fall due to : History of fall(s) History of fall(s) Medication side effect    Follow up Falls evaluation completed Falls evaluation completed Falls evaluation completed;Education provided;Falls prevention discussed Falls evaluation completed Falls evaluation completed    FALL RISK PREVENTION PERTAINING TO THE HOME:  Any stairs in or around the home? No  If so, are there any without handrails? No  Home free of loose throw rugs in walkways, pet beds, electrical cords, etc? Yes  Adequate lighting in your home to reduce risk of falls? Yes   ASSISTIVE DEVICES UTILIZED TO PREVENT FALLS:  Life alert? No  Use of a cane, walker or w/c? Yes  Grab bars in the bathroom? Yes  Shower chair or bench in shower? No  Elevated toilet seat or a handicapped toilet? No   TIMED UP AND GO:  Was the test performed? Yes .  Length of time to ambulate 10 feet: 4 sec.   Gait steady and fast without use of assistive device  Cognitive Function:        Immunizations Immunization History  Administered Date(s) Administered   Hep A / Hep B 10/21/2014, 01/04/2015, 06/21/2015    Influenza,inj,Quad PF,6+ Mos 06/26/2018, 07/13/2019, 08/06/2021   Influenza-Unspecified 06/28/2018, 08/22/2020   Moderna Covid-19 Vaccine Bivalent Booster 40yr & up 08/20/2021   Moderna Sars-Covid-2 Vaccination 01/25/2020, 02/04/2020, 09/12/2020   PPD Test 02/06/2021   Pneumococcal Polysaccharide-23 07/18/2016   Tdap 07/19/2015, 06/02/2019    TDAP status: Up to date  Flu Vaccine status: Up to date  Pneumococcal vaccine status: Due, Education has been provided regarding the importance of this vaccine. Advised may receive this vaccine at local pharmacy or Health Dept. Aware to provide a copy of the vaccination record if obtained from local pharmacy or Health Dept. Verbalized acceptance and understanding.  Covid-19 vaccine status: Completed vaccines  Qualifies for Shingles Vaccine? Yes   Zostavax  completed No   Shingrix Completed?: No.    Education has been provided regarding the importance of this vaccine. Patient has been advised to call insurance company to determine out of pocket expense if they have not yet received this vaccine. Advised may also receive vaccine at local pharmacy or Health Dept. Verbalized acceptance and understanding.  Screening Tests Health Maintenance  Topic Date Due   Zoster Vaccines- Shingrix (1 of 2) Never done   OPHTHALMOLOGY EXAM  04/27/2021   COVID-19 Vaccine (5 - Moderna risk series) 10/15/2021   INFLUENZA VACCINE  06/04/2022   FOOT EXAM  08/06/2022   HEMOGLOBIN A1C  08/07/2022   COLONOSCOPY (Pts 45-49yrs Insurance coverage will need to be confirmed)  11/19/2023   TETANUS/TDAP  06/01/2029   Hepatitis C Screening  Completed   HIV Screening  Completed   HPV VACCINES  Aged Out    Health Maintenance  Health Maintenance Due  Topic Date Due   Zoster Vaccines- Shingrix (1 of 2) Never done   OPHTHALMOLOGY EXAM  04/27/2021   COVID-19 Vaccine (5 - Moderna risk series) 10/15/2021   INFLUENZA VACCINE  06/04/2022    Colorectal cancer screening: Type of  screening: Colonoscopy. Completed 11/18/18. Repeat every 5 yearsappt Sept 20 to have another colonscopy  Lung Cancer Screening: (Low Dose CT Chest recommended if Age 55-80 years, 30 pack-year currently smoking OR have quit w/in 15years.) does not qualify.     Additional Screening:  Hepatitis C Screening: does qualify; Completed 07/18/16  Vision Screening: Recommended annual ophthalmology exams for early detection of glaucoma and other disorders of the eye. Is the patient up to date with their annual eye exam?  Yes  Who is the provider or what is the name of the office in which the patient attends annual eye exams? [facility] in [place] If pt is not established with a provider, would they like to be referred to a provider to establish care? No .   Dental Screening: Recommended annual dental exams for proper oral hygiene  Community Resource Referral / Chronic Care Management: CRR required this visit?  No   CCM required this visit?  No      Plan:     I have personally reviewed and noted the following in the patient's chart:   Medical and social history Use of alcohol, tobacco or illicit drugs  Current medications and supplements including opioid prescriptions. Patient is not currently taking opioid prescriptions. Functional ability and status Nutritional status Physical activity Advanced directives List of other physicians Hospitalizations, surgeries, and ER visits in previous 12 months Vitals Screenings to include cognitive, depression, and falls Referrals and appointments  In addition, I have reviewed and discussed with patient certain preventive protocols, quality metrics, and best practice recommendations. A written personalized care plan for preventive services as well as general preventive health recommendations were provided to patient.      S , LPN   06/28/2022   Nurse Notes: none    

## 2022-07-04 ENCOUNTER — Ambulatory Visit: Payer: Self-pay | Admitting: Licensed Clinical Social Worker

## 2022-07-04 NOTE — Chronic Care Management (AMB) (Signed)
    Clinical Social Work  Care Management   Phone Outreach    07/04/2022 Name: Dale [name]. MRN: [id number] DOB: 1961/05/27  [name] is a 61 y.o. year old male who is a primary care patient of Olin Hauser, DO .   Reason for referral: [place] [facility].    Patient was not contacted during this encounter. Per chart review, pt is not currently receiving CCM Social Work services; therefore, care plan goals were completed and closed.   Review of patient status, including review of consultants reports, relevant laboratory and other test results, and collaboration with appropriate care team members and the patient's provider was performed as part of comprehensive patient evaluation and provision of care management services.    Christa See, MSW, [facility] Care Management [email or web address] Phone [phone] 2:39 PM

## 2022-07-05 ENCOUNTER — Ambulatory Visit: Payer: Self-pay | Admitting: Licensed Clinical Social Worker

## 2022-07-05 NOTE — Chronic Care Management (AMB) (Signed)
    Clinical Social Work  Care Management   Phone Outreach    07/05/2022 Name: Dale [name]. MRN: [id number] DOB: 1961/01/13  Dale [name]. is a 61 y.o. year old male who is a primary care patient of Olin Hauser, DO .   Return call received from patient. He continues to receive services from [place]. Endorses no need for RNCM or LCSW support noting he participates in medication management through [facility] psychiatrist. LCSW reviewed upcoming appts with patient and informed him of flu shot vaccine is available at PCP office, if pt is interested.  Plan: No continued follow up required  Christa See, MSW, [email or web address] Phone [phone] 11:04 AM

## 2022-07-09 DIAGNOSIS — M2041 Other hammer toe(s) (acquired), right foot: Secondary | ICD-10-CM | POA: Diagnosis not present

## 2022-07-09 DIAGNOSIS — M2021 Hallux rigidus, right foot: Secondary | ICD-10-CM | POA: Diagnosis not present

## 2022-07-09 DIAGNOSIS — E1111 Type 2 diabetes mellitus with ketoacidosis with coma: Secondary | ICD-10-CM | POA: Diagnosis not present

## 2022-07-19 DIAGNOSIS — M2021 Hallux rigidus, right foot: Secondary | ICD-10-CM | POA: Diagnosis not present

## 2022-07-19 DIAGNOSIS — Z7982 Long term (current) use of aspirin: Secondary | ICD-10-CM | POA: Diagnosis not present

## 2022-07-19 DIAGNOSIS — E1111 Type 2 diabetes mellitus with ketoacidosis with coma: Secondary | ICD-10-CM | POA: Diagnosis not present

## 2022-07-19 DIAGNOSIS — Z885 Allergy status to narcotic agent status: Secondary | ICD-10-CM | POA: Diagnosis not present

## 2022-07-19 DIAGNOSIS — I10 Essential (primary) hypertension: Secondary | ICD-10-CM | POA: Diagnosis not present

## 2022-07-19 DIAGNOSIS — Z794 Long term (current) use of insulin: Secondary | ICD-10-CM | POA: Diagnosis not present

## 2022-07-19 DIAGNOSIS — Z880 Allergy status to penicillin: Secondary | ICD-10-CM | POA: Diagnosis not present

## 2022-07-19 DIAGNOSIS — G8918 Other acute postprocedural pain: Secondary | ICD-10-CM | POA: Diagnosis not present

## 2022-07-19 DIAGNOSIS — M79671 Pain in right foot: Secondary | ICD-10-CM | POA: Diagnosis not present

## 2022-07-19 DIAGNOSIS — M205X1 Other deformities of toe(s) (acquired), right foot: Secondary | ICD-10-CM | POA: Diagnosis not present

## 2022-07-19 DIAGNOSIS — M2041 Other hammer toe(s) (acquired), right foot: Secondary | ICD-10-CM | POA: Diagnosis not present

## 2022-07-19 DIAGNOSIS — Z7984 Long term (current) use of oral hypoglycemic drugs: Secondary | ICD-10-CM | POA: Diagnosis not present

## 2022-07-27 ENCOUNTER — Other Ambulatory Visit: Payer: Self-pay | Admitting: Family Medicine

## 2022-07-27 DIAGNOSIS — E1140 Type 2 diabetes mellitus with diabetic neuropathy, unspecified: Secondary | ICD-10-CM

## 2022-07-29 NOTE — Telephone Encounter (Signed)
Unable to refill per protocol, rx request is too soon. [place] RF 06/06/22 for 90 days. Will refuse request.  Requested Prescriptions  Pending Prescriptions Disp Refills  . metFORMIN (GLUCOPHAGE) 1000 MG tablet [Pharmacy Med Name: METFORMIN HCL 1,000 MG TABLET] 180 tablet 0    Sig: TAKE 1 TABLET (1,000 MG TOTAL) BY MOUTH TWICE A DAY WITH FOOD     Endocrinology:  Diabetes - Biguanides Failed - 07/27/2022  1:24 PM      Failed - Cr in normal range and within 360 days    Creat  Date Value Ref Range Status  07/11/2021 0.88 0.70 - 1.35 mg/dL Final         Failed - eGFR in normal range and within 360 days    GFR, Est African American  Date Value Ref Range Status  11/10/2020 111 > OR = 60 mL/min/1.21m Final   GFR, Est Non African American  Date Value Ref Range Status  11/10/2020 96 > OR = 60 mL/min/1.763mFinal         Failed - B12 Level in normal range and within 720 days    Vitamin B-12  Date Value Ref Range Status  07/13/2019 547 200 - 1,100 pg/mL Final         Failed - CBC within normal limits and completed in the last 12 months    WBC  Date Value Ref Range Status  09/12/2021 8.4 4.0 - 10.5 K/uL Final   RBC  Date Value Ref Range Status  09/12/2021 5.50 4.22 - 5.81 MIL/uL Final   Hemoglobin  Date Value Ref Range Status  03/07/2022 16.7 13.0 - 17.7 g/dL Final   Hematocrit  Date Value Ref Range Status  03/07/2022 50.3 37.5 - 51.0 % Final   MCHC  Date Value Ref Range Status  09/12/2021 32.8 30.0 - 36.0 g/dL Final   [facility]  09/12/2021 28.7 26.0 - 34.0 pg Final   MCV  Date Value Ref Range Status  09/12/2021 87.6 80.0 - 100.0 fL Final  06/26/2015 87 79 - 97 fL Final  08/20/2013 89 80 - 100 fL Final   No results found for: "PLTCOUNTKUC", "LABPLAT", "POCPLA" RDW  Date Value Ref Range Status  09/12/2021 15.8 (H) 11.5 - 15.5 % Final  06/26/2015 14.4 12.3 - 15.4 % Final  08/20/2013 14.2 11.5 - 14.5 % Final         Passed - HBA1C is between 0  and 7.9 and within 180 days    Hgb A1c MFr Bld  Date Value Ref Range Status  02/05/2022 5.5 <5.7 % of total Hgb Final    Comment:    For the purpose of screening for the presence of diabetes: . <5.7%       Consistent with the absence of diabetes 5.7-6.4%    Consistent with increased risk for diabetes             (prediabetes) > or =6.5%  Consistent with diabetes . This assay result is consistent with a decreased risk of diabetes. . Currently, no consensus exists regarding use of hemoglobin A1c for diagnosis of diabetes in children. . According to American Diabetes Association (ADA) guidelines, hemoglobin A1c <7.0% represents optimal control in non-pregnant diabetic patients. Different metrics may apply to specific patient populations.  Standards of Medical Care in Diabetes(ADA). . Renella Cunas Valid encounter within last 6 months    Recent Outpatient Visits  4 months ago Essential (primary) hypertension   [place], DO   5 months ago Type 2 diabetes, controlled, with neuropathy [facility])   [facility] Olin Hauser, DO   11 months ago Annual physical exam   [facility] Olin Hauser, DO   1 year ago Type 2 diabetes, controlled, with neuropathy [facility])   [facility] Olin Hauser, DO   1 year ago Essential (primary) hypertension   [place], Devonne Doughty, DO      Future Appointments            In 2 weeks [place], Devonne Doughty, DO [facility], [place]   In 1 month [place], Ronda Fairly, [place]

## 2022-08-06 ENCOUNTER — Other Ambulatory Visit: Payer: Self-pay

## 2022-08-06 DIAGNOSIS — E538 Deficiency of other specified B group vitamins: Secondary | ICD-10-CM

## 2022-08-06 DIAGNOSIS — E1349 Other specified diabetes mellitus with other diabetic neurological complication: Secondary | ICD-10-CM

## 2022-08-06 DIAGNOSIS — Z0000 Encounter for general adult medical examination without abnormal findings: Secondary | ICD-10-CM

## 2022-08-06 DIAGNOSIS — I10 Essential (primary) hypertension: Secondary | ICD-10-CM

## 2022-08-06 DIAGNOSIS — E1169 Type 2 diabetes mellitus with other specified complication: Secondary | ICD-10-CM

## 2022-08-06 DIAGNOSIS — E1140 Type 2 diabetes mellitus with diabetic neuropathy, unspecified: Secondary | ICD-10-CM

## 2022-08-06 DIAGNOSIS — F331 Major depressive disorder, recurrent, moderate: Secondary | ICD-10-CM

## 2022-08-07 ENCOUNTER — Other Ambulatory Visit: Payer: Medicare Other

## 2022-08-07 DIAGNOSIS — E1140 Type 2 diabetes mellitus with diabetic neuropathy, unspecified: Secondary | ICD-10-CM | POA: Diagnosis not present

## 2022-08-07 DIAGNOSIS — E1169 Type 2 diabetes mellitus with other specified complication: Secondary | ICD-10-CM | POA: Diagnosis not present

## 2022-08-07 DIAGNOSIS — E1349 Other specified diabetes mellitus with other diabetic neurological complication: Secondary | ICD-10-CM | POA: Diagnosis not present

## 2022-08-07 DIAGNOSIS — E785 Hyperlipidemia, unspecified: Secondary | ICD-10-CM | POA: Diagnosis not present

## 2022-08-07 DIAGNOSIS — I10 Essential (primary) hypertension: Secondary | ICD-10-CM | POA: Diagnosis not present

## 2022-08-08 LAB — CBC WITH DIFFERENTIAL/PLATELET
Absolute Monocytes: 609 cells/uL (ref 200–950)
Basophils Absolute: 28 cells/uL (ref 0–200)
Basophils Relative: 0.4 %
Eosinophils Absolute: 210 cells/uL (ref 15–500)
Eosinophils Relative: 3 %
HCT: 45.7 % (ref 38.5–50.0)
Hemoglobin: 14.8 g/dL (ref 13.2–17.1)
Lymphs Abs: 2688 cells/uL (ref 850–3900)
MCH: 28.1 pg (ref 27.0–33.0)
MCHC: 32.4 g/dL (ref 32.0–36.0)
MCV: 86.9 fL (ref 80.0–100.0)
MPV: 10.3 fL (ref 7.5–12.5)
Monocytes Relative: 8.7 %
Neutro Abs: 3465 cells/uL (ref 1500–7800)
Neutrophils Relative %: 49.5 %
Platelets: 322 10*3/uL (ref 140–400)
RBC: 5.26 10*6/uL (ref 4.20–5.80)
RDW: 14.5 % (ref 11.0–15.0)
Total Lymphocyte: 38.4 %
WBC: 7 10*3/uL (ref 3.8–10.8)

## 2022-08-08 LAB — COMPLETE METABOLIC PANEL WITH GFR
AG Ratio: 1.9 (calc) (ref 1.0–2.5)
ALT: 21 U/L (ref 9–46)
AST: 19 U/L (ref 10–35)
Albumin: 4.5 g/dL (ref 3.6–5.1)
Alkaline phosphatase (APISO): 90 U/L (ref 35–144)
BUN: 18 mg/dL (ref 7–25)
CO2: 31 mmol/L (ref 20–32)
Calcium: 10.1 mg/dL (ref 8.6–10.3)
Chloride: 100 mmol/L (ref 98–110)
Creat: 0.89 mg/dL (ref 0.70–1.35)
Globulin: 2.4 g/dL (calc) (ref 1.9–3.7)
Glucose, Bld: 87 mg/dL (ref 65–99)
Potassium: 5.1 mmol/L (ref 3.5–5.3)
Sodium: 140 mmol/L (ref 135–146)
Total Bilirubin: 0.4 mg/dL (ref 0.2–1.2)
Total Protein: 6.9 g/dL (ref 6.1–8.1)
eGFR: 97 mL/min/{1.73_m2} (ref >=60)

## 2022-08-08 LAB — LIPID PANEL
Cholesterol: 123 mg/dL (ref <200)
HDL: 38 mg/dL — ABNORMAL LOW (ref >=40)
LDL Cholesterol (Calc): 62 mg/dL (calc)
Non-HDL Cholesterol (Calc): 85 mg/dL (calc) (ref <130)
Total CHOL/HDL Ratio: 3.2 (calc) (ref <5.0)
Triglycerides: 145 mg/dL (ref <150)

## 2022-08-08 LAB — HEMOGLOBIN A1C
Hgb A1c MFr Bld: 6 % of total Hgb — ABNORMAL HIGH (ref <5.7)
Mean Plasma Glucose: 126 mg/dL
eAG (mmol/L): 7 mmol/L

## 2022-08-08 LAB — VITAMIN B12: Vitamin B-12: 402 pg/mL (ref 200–1100)

## 2022-08-08 LAB — TSH: TSH: 1.92 mIU/L (ref 0.40–4.50)

## 2022-08-14 ENCOUNTER — Encounter: Payer: Self-pay | Admitting: Family Medicine

## 2022-08-14 ENCOUNTER — Ambulatory Visit (INDEPENDENT_AMBULATORY_CARE_PROVIDER_SITE_OTHER): Payer: Medicare Other | Admitting: Family Medicine

## 2022-08-14 VITALS — BP 91/56 | HR 63 | Ht 72.0 in | Wt 208.0 lb

## 2022-08-14 DIAGNOSIS — F331 Major depressive disorder, recurrent, moderate: Secondary | ICD-10-CM | POA: Diagnosis not present

## 2022-08-14 DIAGNOSIS — Z23 Encounter for immunization: Secondary | ICD-10-CM | POA: Diagnosis not present

## 2022-08-14 DIAGNOSIS — E1169 Type 2 diabetes mellitus with other specified complication: Secondary | ICD-10-CM

## 2022-08-14 DIAGNOSIS — E1140 Type 2 diabetes mellitus with diabetic neuropathy, unspecified: Secondary | ICD-10-CM

## 2022-08-14 DIAGNOSIS — Z0000 Encounter for general adult medical examination without abnormal findings: Secondary | ICD-10-CM | POA: Diagnosis not present

## 2022-08-14 DIAGNOSIS — N3281 Overactive bladder: Secondary | ICD-10-CM | POA: Diagnosis not present

## 2022-08-14 DIAGNOSIS — E663 Overweight: Secondary | ICD-10-CM | POA: Diagnosis not present

## 2022-08-14 DIAGNOSIS — E785 Hyperlipidemia, unspecified: Secondary | ICD-10-CM

## 2022-08-14 MED ORDER — MIRABEGRON ER 50 MG PO TB24: 50.0000 mg | ORAL_TABLET | Freq: Every day | ORAL | 5 refills | Status: DC

## 2022-08-14 NOTE — Patient Instructions (Addendum)
Thank you for coming to the office today.  Based on BP readings, with lower range BP and cardiovascular health, I would be comfortable if Yvone Neu started the low dose Adderall as suggested to benefit the focus and concentration. We would be able to closely monitor the blood pressure and any adverse cardiovascular effects.  REDUCE Lisinopril from 10 to '5mg'$ , take half tab of the 10 and let me know if need new order.  Will look into the salty vs metallic taste from medication side effect and let you know if we can find the answer.  Lab results overall excellent  No other change to medications today.  Diabetic Eye Exam requested  Urine test today for protein  Please schedule a Follow-up Appointment to: Return in about 6 months (around 02/13/2023) for 6 month DM A1c.  If you have any other questions or concerns, please feel free to call the office or send a message through [place]. You may also schedule an earlier appointment if necessary.  Additionally, you may be receiving a survey about your experience at our office within a few days to 1 week by e-mail or mail. We value your feedback.  [name], DO [place]

## 2022-08-14 NOTE — Progress Notes (Signed)
Subjective:    Patient ID: Dale [name]., male    DOB: 07/23/1961, 61 y.o.   MRN: [id number]  Dale [name]. is a 61 y.o. male presenting on 08/14/2022 for Annual Exam   HPI  Here for Annual Physical and Lab Review  CHRONIC HTN: BP lower on avg Followed by [facility] Cardiology Dr Nehemiah Massed Current Meds - Lisinopril 60m daily, Propranolol 485mTID - OFF Amlodipine, HCTZ Reports good compliance, took meds today. Tolerating well, w/o complaints. Denies CP, dyspnea, HA, edema, dizziness / lightheadedness   Type 2 DM with peripheral neuropathy Hyperlipidemia T2DM Last A1c 6.0% (08/2022) Due for A1c On medications - Ozempic 62m48meekly, Metformin 1000m50mD concern with intestinal blockage slowing down side effect on Ozempic, when off med for months it worsened. CBG 100-120s avg now Lab reviewed On Statin    Tremors Propranolol 40mg64m - improved tremors   Recurrent Major Depression Followed by Psychiatry CBC Currently on medication management   Dale [name] has upcoming video conference with Psychiatry on 4/14 Recent acute panic attack / anxiety flare  May need to take Alprazolam while working or as back up plan.   [facility] Knee Osteoarthritis - Left Knee drained fluid off of it, they recommended "half" knee replacement specialist, but Dale [name] declined.  Salty taste behind teeth, dentist has no clue. About 1 year to 1.5 years persistent problem Doesn't change, persistent  08/21/22 Eye Exam       08/14/2022   10:59 AM 06/28/2022   11:04 AM 03/28/2022   10:58 AM  Depression screen PHQ 2/9  Decreased Interest 2 0 2  Down, Depressed, Hopeless 2 0 1  PHQ - 2 Score 4 0 3  Altered sleeping 2 0 0  Tired, decreased energy 1 0 1  Change in appetite 2 0 0  Feeling bad or failure about yourself  1 0 0  Trouble concentrating 0 0 1  Moving slowly or fidgety/restless 1 0 0  Suicidal thoughts 0 0 0  PHQ-9 Score 11 0 5  Difficult doing work/chores Very difficult Not  difficult at all     Past Medical History:  Diagnosis Date   ADHD (attention deficit hyperactivity disorder)    Anxiety    Coronary artery disease    Coronary artery disease    Coronary atherosclerosis    Depression    GERD (gastroesophageal reflux disease)    Glaucoma    Headache    migrane   Heart disease    Hyperlipidemia    Hypertension    Hypertriglyceridemia    Hypogonadism in male    MVP (mitral valve prolapse)    Narcolepsy and cataplexy    Non-alcoholic fatty liver disease    Past Surgical History:  Procedure Laterality Date   COLONOSCOPY WITH PROPOFOL N/A 11/18/2018   Procedure: COLONOSCOPY WITH PROPOFOL;  Surgeon: EllioManya Silvas  [facility];  Service: Endoscopy;  Laterality: N/A;   ESOPHAGOGASTRODUODENOSCOPY (EGD) WITH PROPOFOL N/A 11/18/2018   Procedure: ESOPHAGOGASTRODUODENOSCOPY (EGD) WITH PROPOFOL;  Surgeon: EllioManya Silvas  [facility];  Service: Endoscopy;  Laterality: N/A;   pain pump implant and removal Bilateral    Dec. 21 2019   SPINE SURGERY     Multiple surgeries, initial 2001, thoracic disc repair and recurrent revisions.   THROAT SURGERY     Social History   Socioeconomic History   Marital status: Married    Spouse name: Not on file   Number of children: Not on  file   Years of education: 1 yr of college   Highest education level: Not on file  Occupational History   Occupation: Disabled  Tobacco Use   Smoking status: Former    Types: Cigarettes    Start date: 08/29/1979    Quit date: 11/04/2010    Years since quitting: 11.7   Smokeless tobacco: Former    Quit date: 11/19/2010   Tobacco comments:    1 to 1.5 packs per day  Vaping Use   Vaping Use: Never used  Substance and Sexual Activity   Alcohol use: No    Alcohol/week: 0.0 standard drinks of alcohol   Drug use: No   Sexual activity: Not Currently  Other Topics Concern   Not on file  Social History Narrative   Not on file   Social  Determinants of Health   Financial Resource Strain: Low Risk  (06/28/2022)   Overall Financial Resource Strain (CARDIA)    Difficulty of Paying Living Expenses: Not hard at all  Food Insecurity: No Food Insecurity (06/28/2022)   Hunger Vital Sign    Worried About Running Out of Food in the Last Year: Never true    [place] in the Last Year: Never true  Transportation Needs: No Transportation Needs (06/28/2022)   PRAPARE - Hydrologist (Medical): No    Lack of Transportation (Non-Medical): No  Physical Activity: Insufficiently Active (06/28/2022)   Exercise Vital Sign    Days of Exercise per Week: 2 days    Minutes of Exercise per Session: 20 min  Stress: No Stress Concern Present (06/28/2022)   [place]    Feeling of Stress : Not at all  Social Connections: Moderately Isolated (06/28/2022)   Social Connection and Isolation Panel [NHANES]    Frequency of Communication with Friends and Family: Once a week    Frequency of Social Gatherings with Friends and Family: Never    Attends Religious Services: More than 4 times per year    Active Member of [employer] or Organizations: No    Attends Archivist Meetings: Never    Marital Status: Married  Human resources officer Violence: Not At Risk (06/28/2022)   Humiliation, Afraid, Rape, and Kick questionnaire    Fear of Current or Ex-Partner: No    Emotionally Abused: No    Physically Abused: No    Sexually Abused: No   Family History  Problem Relation Age of Onset   Depression Mother    Anxiety disorder Mother    Diabetes Mother    Heart failure Mother    Heart attack Father    Diabetes Sister    Obesity Sister    Drug abuse Brother    Alcohol abuse Brother    Depression Sister    Anxiety disorder Sister    Drug abuse Sister    Anxiety disorder Sister    Seizures Sister    Diabetes Sister    Depression Sister    Current Outpatient  Medications on File Prior to Visit  Medication Sig   acetaminophen (TYLENOL) 500 MG tablet Take 500 mg by mouth daily as needed.   ALPRAZolam (XANAX) 1 MG tablet Take 1 mg by mouth 3 (three) times daily as needed.   aspirin EC 81 MG tablet Take 81 mg by mouth daily.   atorvastatin (LIPITOR) 10 MG tablet TAKE 1 TABLET BY MOUTH  DAILY AT 2 PM   b complex vitamins capsule  Take by mouth.   Blood Glucose Monitoring Suppl (TRUE METRIX AIR GLUCOSE METER) w/Device KIT Check blood sugar 2 times daily   buPROPion (WELLBUTRIN XL) 300 MG 24 hr tablet Take 1 tablet (300 mg total) by mouth daily.   CINNAMON PO Take by mouth.   Coenzyme Q10 100 MG TABS Take 100 mg by mouth daily.   cyanocobalamin 1000 MCG tablet Take 1 tablet by mouth daily.   desoximetasone (TOPICORT) 0.25 % cream Apply 1 Application topically 2 (two) times daily as needed.   diclofenac sodium (VOLTAREN) 1 % GEL Apply topically.   dicyclomine (BENTYL) 20 MG tablet    DULoxetine (CYMBALTA) 60 MG capsule Take 1 capsule (60 mg total) by mouth Two (2) times a day. Morning and early afternoon   gabapentin (NEURONTIN) 300 MG capsule Take 3 capsules by mouth 3 (three) times daily.   Glucosamine-Chondroitin 250-200 MG TABS Take by mouth.   hydrOXYzine (ATARAX) 25 MG tablet Take 25 mg by mouth 2 (two) times daily as needed.   ibuprofen (ADVIL) 800 MG tablet take One tab PO TID with food   lisinopril (ZESTRIL) 10 MG tablet Take 0.5 tablets by mouth daily.   meloxicam (MOBIC) 7.5 MG tablet Take by mouth.   metFORMIN (GLUCOPHAGE) 1000 MG tablet Take 1 tablet (1,000 mg total) by mouth 2 (two) times daily with a meal.   Multiple Vitamin (MULTIVITAMIN) tablet Take 1 tablet by mouth daily.   Omega-3 Fatty Acids (FISH OIL) 1200 MG CAPS Take 1 capsule (1,200 mg total) by mouth daily. (Patient taking differently: Take 1,200 mg by mouth 2 (two) times daily.)   OZEMPIC, 1 MG/DOSE, 4 MG/3ML SOPN Inject 1 mg into the skin once a week.   pantoprazole (PROTONIX)  40 MG tablet Take 40 mg by mouth in the morning.   polyethylene glycol (MIRALAX / GLYCOLAX) 17 g packet Take 17 g by mouth daily as needed.   propranolol (INDERAL) 40 MG tablet TAKE 1 TABLET BY MOUTH THREE TIMES A DAY   QUEtiapine (SEROQUEL) 25 MG tablet Take by mouth.   Sod Fluoride-Potassium Nitrate (SODIUM FLUORIDE 5000 SENSITIVE) 1.1-5 % GEL BRUSH TEETH WITH PASTE FOR 2 MINUTES DAILY BEFORE BED   tamsulosin (FLOMAX) 0.4 MG CAPS capsule TAKE 2 CAPSULES BY MOUTH EVERY DAY   testosterone cypionate (DEPOTESTOSTERONE CYPIONATE) 200 MG/ML injection INJECT 1 ML (200 MG TOTAL) INTO THE MUSCLE EVERY 14 DAYS   tiZANidine (ZANAFLEX) 2 MG tablet Take 2 mg by mouth at bedtime as needed.   tiZANidine (ZANAFLEX) 4 MG tablet 1 tablet twice daily, May take an additional 1/2 tablet at bedtime as needed   TRUE METRIX BLOOD GLUCOSE TEST test strip Check blood sugar 2 times a day   No current facility-administered medications on file prior to visit.    Review of Systems Per HPI unless specifically indicated above     Objective:    BP (!) 91/56   Pulse 63   Ht 6' (1.829 m)   Wt 208 lb (94.3 kg)   SpO2 94%   BMI 28.21 kg/m   Wt Readings from Last 3 Encounters:  08/14/22 208 lb (94.3 kg)  06/28/22 207 lb (93.9 kg)  04/04/22 200 lb (90.7 kg)    Physical Exam Vitals and nursing note reviewed.  Constitutional:      General: Dale [name] is not in acute distress.    Appearance: Dale [name] is well-developed. Dale [name] is not diaphoretic.     Comments: Well-appearing, comfortable, cooperative  HENT:  Head: Normocephalic and atraumatic.  Eyes:     General:        Right eye: No discharge.        Left eye: No discharge.     Conjunctiva/sclera: Conjunctivae normal.     Pupils: Pupils are equal, round, and reactive to light.  Neck:     Thyroid: No thyromegaly.  Cardiovascular:     Rate and Rhythm: Normal rate and regular rhythm.     Pulses: Normal pulses.     Heart sounds: Normal heart sounds. No murmur  heard. Pulmonary:     Effort: Pulmonary effort is normal. No respiratory distress.     Breath sounds: Normal breath sounds. No wheezing or rales.  Abdominal:     General: Bowel sounds are normal. There is no distension.     Palpations: Abdomen is soft. There is no mass.     Tenderness: There is no abdominal tenderness.  Musculoskeletal:        General: No tenderness. Normal range of motion.     Cervical back: Normal range of motion and neck supple.     Comments: Upper / Lower Extremities: - Normal muscle tone, strength bilateral upper extremities 5/5, lower extremities 5/5  Lymphadenopathy:     Cervical: No cervical adenopathy.  Skin:    General: Skin is warm and dry.     Findings: No erythema or rash.  Neurological:     Mental Status: Dale [name] is alert and oriented to person, place, and time.     Comments: Distal sensation intact to light touch all extremities  Psychiatric:        Mood and Affect: Mood normal.        Behavior: Behavior normal.        Thought Content: Thought content normal.     Comments: Well groomed, good eye contact, normal speech and thoughts    Diabetic Foot Exam - Simple   Simple Foot Form Diabetic Foot exam was performed with the following findings: Yes 08/14/2022 11:23 AM  Visual Inspection See comments: Yes Sensation Testing See comments: Yes Pulse Check Posterior Tibialis and Dorsalis pulse intact bilaterally: Yes Comments Unable to test R foot w/ walking boot. LEFT dramatic reduced monofilament sensory testing. Has some intact sensation dorsal only. Bilateral callus formation great toes and forefoot     Results for orders placed or performed in visit on 08/06/22  TSH  Result Value Ref Range   TSH 1.92 0.40 - 4.50 mIU/L  Vitamin B12  Result Value Ref Range   Vitamin B-12 402 200 - 1,100 pg/mL  Hemoglobin A1c  Result Value Ref Range   Hgb A1c MFr Bld 6.0 (H) <5.7 % of total Hgb   Mean Plasma Glucose 126 mg/dL   eAG (mmol/L) 7.0 mmol/L  CBC  with Differential/Platelet  Result Value Ref Range   WBC 7.0 3.8 - 10.8 Thousand/uL   RBC 5.26 4.20 - 5.80 Million/uL   Hemoglobin 14.8 13.2 - 17.1 g/dL   HCT 45.7 38.5 - 50.0 %   MCV 86.9 80.0 - 100.0 fL   MCH 28.1 27.0 - 33.0 pg   MCHC 32.4 32.0 - 36.0 g/dL   RDW 14.5 11.0 - 15.0 %   Platelets 322 140 - 400 Thousand/uL   MPV 10.3 7.5 - 12.5 fL   Neutro Abs 3,465 1,500 - 7,800 cells/uL   Lymphs Abs 2,688 850 - 3,900 cells/uL   Absolute Monocytes 609 200 - 950 cells/uL   Eosinophils Absolute 210 15 - 500  cells/uL   Basophils Absolute 28 0 - 200 cells/uL   Neutrophils Relative % 49.5 %   Total Lymphocyte 38.4 %   Monocytes Relative 8.7 %   Eosinophils Relative 3.0 %   Basophils Relative 0.4 %  COMPLETE METABOLIC PANEL WITH GFR  Result Value Ref Range   Glucose, Bld 87 65 - 99 mg/dL   BUN 18 7 - 25 mg/dL   Creat 0.89 0.70 - 1.35 mg/dL   eGFR 97 > OR = 60 mL/min/1.39m   BUN/Creatinine Ratio SEE NOTE: 6 - 22 (calc)   Sodium 140 135 - 146 mmol/L   Potassium 5.1 3.5 - 5.3 mmol/L   Chloride 100 98 - 110 mmol/L   CO2 31 20 - 32 mmol/L   Calcium 10.1 8.6 - 10.3 mg/dL   Total Protein 6.9 6.1 - 8.1 g/dL   Albumin 4.5 3.6 - 5.1 g/dL   Globulin 2.4 1.9 - 3.7 g/dL (calc)   AG Ratio 1.9 1.0 - 2.5 (calc)   Total Bilirubin 0.4 0.2 - 1.2 mg/dL   Alkaline phosphatase (APISO) 90 35 - 144 U/L   AST 19 10 - 35 U/L   ALT 21 9 - 46 U/L  Lipid panel  Result Value Ref Range   Cholesterol 123 <200 mg/dL   HDL 38 (L) > OR = 40 mg/dL   Triglycerides 145 <150 mg/dL   LDL Cholesterol (Calc) 62 mg/dL (calc)   Total CHOL/HDL Ratio 3.2 <5.0 (calc)   Non-HDL Cholesterol (Calc) 85 <130 mg/dL (calc)      Assessment & Plan:   Problem List Items Addressed This Visit     Hyperlipidemia associated with type 2 diabetes mellitus (HCC)   Moderate episode of recurrent major depressive disorder (HCC)   Type 2 diabetes, controlled, with neuropathy ([place]   Relevant Orders   Urine Microalbumin w/creat.  ratio   Other Visit Diagnoses     Annual physical exam    -  Primary   Overweight (BMI 25.0-29.9)       Need for shingles vaccine       Relevant Orders   Varicella-zoster vaccine IM   OAB (overactive bladder)       Relevant Medications   mirabegron ER (MYRBETRIQ) 50 MG TB24 tablet       Updated Health Maintenance information Reviewed recent lab results with patient Encouraged improvement to lifestyle with diet and exercise Goal of weight loss  Based on BP readings, with lower range BP and cardiovascular health, I would be comfortable if Dale [name] the low dose Adderall as suggested to benefit the focus and concentration. We would be able to closely monitor the blood pressure and any adverse cardiovascular effects.  REDUCE Lisinopril from 10 to 544m take half tab of the 10 and let me know if need new order.  Will look into the salty vs metallic taste from medication side effect and let you know if we can find the answer. Possible Lisinopril side effect, will consult with CCM pharmacy.  Lab results overall excellent  No other change to medications today.  Diabetic Eye Exam requested  Urine test today for protein   Meds ordered this encounter  Medications   mirabegron ER (MYRBETRIQ) 50 MG TB24 tablet    Sig: Take 1 tablet (50 mg total) by mouth daily.    Dispense:  30 tablet    Refill:  5      Follow up plan: Return in about 6 months (around 02/13/2023) for 6 month DM A1c.  [name], [facility] 08/14/2022, 11:23 AM

## 2022-08-14 NOTE — Assessment & Plan Note (Signed)
Significantly improved DM control now A1c 6.0 Off Ozempic still Improved w/ wt loss, metformin Resolved hyperglycemia, more stabilized sugar now Complications - peripheral neuropathy (also secondary to multiple back surgeries, with nerve damage), Hyperlipidemia, GERD  Plan:  1. Back on Ozempic '1mg'$  weekly inj, had come off Metformin when was doing well on Ozempic but now back on Ozempic  2. Encourage improved lifestyle - low carb, low sugar diet, reduce portion size, continue improving regular exercise 3. Check CBG, bring log to next visit for review 4. Continue ACEi, Statin

## 2022-08-15 ENCOUNTER — Other Ambulatory Visit: Payer: Self-pay | Admitting: Family Medicine

## 2022-08-15 DIAGNOSIS — E1140 Type 2 diabetes mellitus with diabetic neuropathy, unspecified: Secondary | ICD-10-CM

## 2022-08-15 DIAGNOSIS — N401 Enlarged prostate with lower urinary tract symptoms: Secondary | ICD-10-CM

## 2022-08-15 NOTE — Telephone Encounter (Signed)
Requested Prescriptions  Pending Prescriptions Disp Refills  . metFORMIN (GLUCOPHAGE) 1000 MG tablet [Pharmacy Med Name: METFORMIN HCL 1,000 MG TABLET] 180 tablet 0    Sig: TAKE 1 TABLET (1,000 MG TOTAL) BY MOUTH TWICE A DAY WITH FOOD     Endocrinology:  Diabetes - Biguanides Passed - 08/15/2022  1:06 AM      Passed - Cr in normal range and within 360 days    Creat  Date Value Ref Range Status  08/07/2022 0.89 0.70 - 1.35 mg/dL Final         Passed - HBA1C is between 0 and 7.9 and within 180 days    Hgb A1c MFr Bld  Date Value Ref Range Status  08/07/2022 6.0 (H) <5.7 % of total Hgb Final    Comment:    For someone without known diabetes, a hemoglobin  A1c value between 5.7% and 6.4% is consistent with prediabetes and should be confirmed with a  follow-up test. . For someone with known diabetes, a value <7% indicates that their diabetes is well controlled. A1c targets should be individualized based on duration of diabetes, age, comorbid conditions, and other considerations. . This assay result is consistent with an increased risk of diabetes. . Currently, no consensus exists regarding use of hemoglobin A1c for diagnosis of diabetes for children. .          Passed - eGFR in normal range and within 360 days    GFR, Est African American  Date Value Ref Range Status  11/10/2020 111 > OR = 60 mL/min/1.29m Final   GFR, Est Non African American  Date Value Ref Range Status  11/10/2020 96 > OR = 60 mL/min/1.775mFinal   eGFR  Date Value Ref Range Status  08/07/2022 97 > OR = 60 mL/min/1.7340minal         Passed - B12 Level in normal range and within 720 days    Vitamin B-12  Date Value Ref Range Status  08/07/2022 402 200 - 1,100 pg/mL Final         Passed - Valid encounter within last 6 months    Recent Outpatient Visits          Yesterday Annual physical exam   [place]   4 months ago Essential (primary)  hypertension   [place]   6 months ago Type 2 diabetes, [facility], with [facility] ([facility]   1 year ago Annual physical exam   [facility]   1 year ago Type 2 diabetes, controlled, with neuropathy ([facility]      Future Appointments            In 2 weeks StoBernardo HeatercoRonda FairlyD ConBurginIn 6 months KarParks RangerleDevonne DoughtyO [facility]thin normal limits and completed in the last 12 months    WBC  Date Value Ref Range Status  08/07/2022 7.0 3.8 - 10.8 Thousand/uL Final   RBC  Date Value Ref Range Status  08/07/2022 5.26 4.20 - 5.80 Million/uL Final   Hemoglobin  Date Value Ref Range Status  08/07/2022 14.8 13.2 - 17.1 g/dL Final  03/07/2022 16.7 13.0 - 17.7 g/dL Final  HCT  Date Value Ref Range Status  08/07/2022 45.7 38.5 - 50.0 % Final   Hematocrit  Date Value Ref Range Status  03/07/2022 50.3 37.5 - 51.0 % Final   MCHC  Date Value Ref Range Status  08/07/2022 32.4 32.0 - 36.0 g/dL Final   [facility]  Date Value Ref Range Status  08/07/2022 28.1 27.0 - 33.0 pg Final   MCV  Date Value Ref Range Status  08/07/2022 86.9 80.0 - 100.0 fL Final  06/26/2015 87 79 - 97 fL Final  08/20/2013 89 80 - 100 fL Final   No results found for: "PLTCOUNTKUC", "LABPLAT", "POCPLA" RDW  Date Value Ref Range Status  08/07/2022 14.5 11.0 - 15.0 % Final  06/26/2015 14.4 12.3 - 15.4 % Final  08/20/2013 14.2 11.5 - 14.5 % Final         . tamsulosin (FLOMAX) 0.4 MG CAPS capsule [Pharmacy Med Name: TAMSULOSIN HCL 0.4 MG CAPSULE] 180 capsule 0    Sig: TAKE 2 CAPSULES BY MOUTH [place]     Urology: Alpha-Adrenergic Blocker Passed - 08/15/2022  1:06 AM      Passed - PSA in normal range and within 360 days    PSA   Date Value Ref Range Status  07/13/2019 0.5 < OR = 4.0 ng/mL Final    Comment:    The total PSA value from this assay system is  standardized against the WHO standard. The test  result will be approximately 20% lower when compared  to the equimolar-standardized total PSA (Beckman  Coulter). Comparison of serial PSA results should be  interpreted with this fact in mind. . This test was performed using the Siemens  chemiluminescent method. Values obtained from  different assay methods cannot be used interchangeably. PSA levels, regardless of value, should not be interpreted as absolute evidence of the presence or absence of disease.    Prostate Specific Ag, Serum  Date Value Ref Range Status  08/31/2021 0.5 0.0 - 4.0 ng/mL Final    Comment:    Roche ECLIA methodology. According to the American Urological Association, Serum PSA should decrease and remain at undetectable levels after radical prostatectomy. The AUA defines biochemical recurrence as an initial PSA value 0.2 ng/mL or greater followed by a subsequent confirmatory PSA value 0.2 ng/mL or greater. Values obtained with different assay methods or kits cannot be used interchangeably. Results cannot be interpreted as absolute evidence of the presence or absence of malignant disease.          Passed - Last BP in normal range    BP Readings from Last 1 Encounters:  08/14/22 (!) 91/56         Passed - Valid encounter within last 12 months    Recent Outpatient Visits          Yesterday Annual physical exam   [place], DO   4 months ago Essential (primary) hypertension   [place], DO   6 months ago Type 2 diabetes, controlled, with neuropathy [facility])   [place], DO   1 year ago Annual physical exam   [facility] Olin Hauser, DO   1 year ago Type 2 diabetes,  controlled, with neuropathy [facility])   [facility] Olin Hauser, DO      Future Appointments            In 2 weeks Bernardo Heater, [employer]  Loletha Grayer, [place]   In 6 months Parks Ranger, Devonne Doughty, DO [facility], [facility]

## 2022-08-19 ENCOUNTER — Ambulatory Visit: Payer: Medicare Other | Admitting: Pharmacist

## 2022-08-19 DIAGNOSIS — I10 Essential (primary) hypertension: Secondary | ICD-10-CM

## 2022-08-19 MED ORDER — LISINOPRIL 5 MG PO TABS: 5.0000 mg | ORAL_TABLET | Freq: Every day | ORAL | 3 refills | Status: DC

## 2022-08-19 NOTE — Chronic Care Management (AMB) (Signed)
08/19/2022 Name: [name] MRN: [id number] DOB: April 07, 1961  Chief Complaint  Patient presents with   Medication Management    Frederik Standley. is a 61 y.o. year old male who presented for a telephone visit.   They were referred to the pharmacist by their PCP for assistance in managing diabetes, hypertension, and complex medication management.   Receive a message from PCP advising patient complaining of "salty taste" in his mouth and requests assistance with identifying if any of his medications may be contributing  Subjective:  Care Team: Primary Care Provider: Olin Hauser, DO ; Next Scheduled Visit: 02/13/2023 Neurologist: Ray Church, MD; Next Scheduled Visit: 08/23/2022 Psychiatrist: Georgia Dom, MD; Next Scheduled Visit: 09/24/2022 Pain Management: [facility]; Next Scheduled Visit: 08/27/2022 Urology: [facility]; Next Scheduled Visit: 09/02/2022 Orthopedics: [employer]; Next Scheduled Visit: 08/26/2022  Medication Access/Adherence  Current Pharmacy:  [id number] [place] [place]: [phone]: [phone] [facility] (OF[place] CCayeyLBism[facility] [place]Phone: [phone]: 8[place] [address]Phone: [phone]: [phone]  Patient reports affordability concerns with their medications: Yes  Patient reports access/transportation concerns to their pharmacy: No  Patient reports adherence concerns with their medications:  No     Diabetes:  Current medications: metformin 1000 mg twice daily Medications tried in the past: Ozempic (unable to afford when in coverage gap)  Current glucose readings: morning fasting  ranging: 91-120  Current physical activity: Limited by recent   Current medication access support: Based on reported income, patient does not meet criteria for available medication assistance programs  Hypertension:  Current medications:  Lisinopril 10 mg daily Today patient denies having decreased lisinopril dose as instructed by PCP; has continued to take full tablet daily Propranolol IR 489mthree times daily  Medications previously tried: HCTZ (stopped due to hypotension)  Patient has an automated, upper arm home BP cuff  From review of PCP Office Visit note from 10/11, provider advised patient to REDUCE lisinopril dose from 10 to 66m1my taking half tab of the 10 mg strength daily Today patient denies having decreased lisinopril dose as instructed. Reports difficulty with cutting lisinopril tablet CPP sends new Rx for lisinopril 5 mg daily to pharmacy for patient as requested  Patient states recently feels like his balance is off most of the time. States that when standing, sometimes feels like he is falling backwards - Reports fell a couple of nights ago and hit the side. Reports light bruising on his chest, but otherwise denies any injury.  - Encourage patient to follow up with PCP regarding his balance as well as let psychiatrist know about his balance concerns. - Patient plans to start reduced dose of lisinopril 5 mg daily tomorrow.  Agrees that if balance not improved with this change, will schedule follow up with PCP regarding his balance  Current physical activity: Reports limited by recent foot surgery   Medication Management/Possible Side Effect Question:  Reports has had a salty taste behind his teeth for ~1 year Reports has discussed with his dentist, but they could not identify a cause  Patient denies dizziness/sedation with current medications, but does admit to recent trouble with his balance (see  above under hypertension section) From review of chart, per recent  Telemedicine Encounter on 10/10 with [facility], psychiatrist noted working with patient on adjusting medications to improve patient's lack of concentration as well as to improve his sleep Reports working on reducing frequency of use of alprazolam as discussed with psychiatrist  Patient prescribed hydroxyzine 25 mg twice daily as needed for anxiety, but today reports taking his hydroxyzine 25 mg - 2 tablets (50 mg) QHS as needed for sleep  Admits to chronic dry mouth  Denies hydroxyzine use helping with sleep  Patient denies having yet picked up/started trazodone 50 mg nightly prn as prescribed by psychiatrist on 10/10  Health Maintenance  Health Maintenance Due  Topic Date Due   Diabetic kidney evaluation - Urine ACR  06/25/2016   OPHTHALMOLOGY EXAM  04/27/2021   COVID-19 Vaccine (5 - Moderna risk series) 10/15/2021     Objective: Lab Results  Component Value Date   HGBA1C 6.0 (H) 08/07/2022    Lab Results  Component Value Date   CREATININE 0.89 08/07/2022   BUN 18 08/07/2022   NA 140 08/07/2022   K 5.1 08/07/2022   CL 100 08/07/2022   CO2 31 08/07/2022    Lab Results  Component Value Date   CHOL 123 08/07/2022   HDL 38 (L) 08/07/2022   LDLCALC 62 08/07/2022   TRIG 145 08/07/2022   CHOLHDL 3.2 08/07/2022    Medications Reviewed Today     Reviewed by [name], RPH-CPP (Pharmacist) on 08/19/22 at 1353  Med List Status: <None>   Medication Order Taking? Sig Documenting Provider Last Dose Status Informant  acetaminophen (TYLENOL) 500 MG tablet [id number] Yes Take 500 mg by mouth 2 (two) times daily as needed. [provider] Taking Active   ALPRAZolam Duanne Moron) 1 MG tablet [id number] Yes Take 1 mg by mouth 3 (three) times daily as needed. Sherri Rad, MD Taking Active   aspirin EC 81 MG tablet [id number] Yes Take 81 mg by mouth daily. [provider] Taking Active   atorvastatin (LIPITOR) 10 MG tablet [id number] Yes TAKE 1 TABLET BY  MOUTH  DAILY AT 2 PM Karamalegos, Devonne Doughty, DO Taking Active   b complex vitamins capsule [id number] Yes Take by mouth. [provider] Taking Active   Blood Glucose Monitoring Suppl (TRUE METRIX AIR GLUCOSE METER) w/Device KIT [id number]  Check blood sugar 2 times daily Olin Hauser, DO  Active   buPROPion (WELLBUTRIN XL) 300 MG 24 hr tablet [id number] Yes Take 1 tablet (300 mg total) by mouth daily. Olin Hauser, DO Taking Active   CINNAMON PO [id number] Yes Take by mouth. [provider] Taking Active   Coenzyme Q10 100 MG TABS [id number] Yes Take 100 mg by mouth daily. [provider] Taking Active   cyanocobalamin 1000 MCG tablet [id number] Yes Take 1 tablet by mouth daily. [provider] Taking Active   desoximetasone (TOPICORT) 0.25 % cream [id number] Yes Apply 1 Application topically 2 (two) times daily as needed. Olin Hauser, DO Taking Active   diclofenac sodium (VOLTAREN) 1 % GEL [id number] Yes Apply topically. [provider] Taking Active   DULoxetine (CYMBALTA) 60 MG capsule [id number] Yes Take 1 capsule (60 mg total) by mouth Two (2) times a day. Morning and early afternoon [provider] Taking Active   gabapentin (NEURONTIN) 300 MG capsule [id number] Yes Take 3 capsules by mouth 3 (three) times daily. [provider] Taking Active  Med Note [facility], Canda Podgorski A   Wed Nov 08, 2020  4:56 PM) Reports taking 900 mg in the morning and at noon, and take 1200 mg (4 capsules) nightly as directed by [facility]  Glucosamine-Chondroitin 250-200 MG TABS [id number] Yes Take by mouth. [provider] Taking Active   hydrOXYzine (ATARAX) 25 MG tablet [id number] Yes Take 25 mg by mouth 2 (two) times daily as needed. [provider] Taking Active   ibuprofen (ADVIL) 800 MG tablet [id number] Yes Take 800 mg by mouth 2 (two) times daily as needed. [provider]  Taking Active   lisinopril (ZESTRIL) 10 MG tablet [id number] Yes Take 0.5 tablets by mouth daily. [provider] Taking Active   metFORMIN (GLUCOPHAGE) 1000 MG tablet [id number] Yes TAKE 1 TABLET (1,000 MG TOTAL) BY MOUTH TWICE A DAY WITH FOOD Olin Hauser, DO Taking Active   Multiple Vitamin (MULTIVITAMIN) tablet [id number] Yes Take 1 tablet by mouth daily. [provider] Taking Active   Omega-3 Fatty Acids (FISH OIL) 1200 MG CAPS [id number] Yes Take 1 capsule (1,200 mg total) by mouth daily.  Patient taking differently: Take 1,200 mg by mouth 2 (two) times daily.   Olin Hauser, DO Taking Active   OZEMPIC, 1 MG/DOSE, 4 MG/3ML SOPN [id number] No Inject 1 mg into the skin once a week.  Patient not taking: Reported on 08/19/2022   Olin Hauser, DO Not Taking Active   pantoprazole (PROTONIX) 40 MG tablet [id number] Yes Take 40 mg by mouth in the morning. [provider] Taking Active   polyethylene glycol (MIRALAX / GLYCOLAX) 17 g packet [id number] No Take 17 g by mouth daily as needed.  Patient not taking: Reported on 08/19/2022   [provider] Not Taking Active   propranolol (INDERAL) 40 MG tablet [id number] Yes TAKE 1 TABLET BY MOUTH THREE TIMES A DAY Olin Hauser, DO Taking Active   tamsulosin (FLOMAX) 0.4 MG CAPS capsule [id number] Yes TAKE 2 CAPSULES BY MOUTH EVERY DAY Karamalegos, Devonne Doughty, DO Taking Active   testosterone cypionate (DEPOTESTOSTERONE CYPIONATE) 200 MG/ML injection [id number] Yes INJECT 1 ML (200 MG TOTAL) INTO THE MUSCLE EVERY 14 DAYS Stoioff, Ronda Fairly, MD Taking Active   tiZANidine (ZANAFLEX) 2 MG tablet [id number] Yes Take 2 mg by mouth at bedtime as needed. [provider] Taking Active   tiZANidine (ZANAFLEX) 4 MG tablet [id number] Yes 1 tablet twice daily, May take an additional 1/2 tablet at bedtime as needed [provider] Taking Active   traMADol (ULTRAM) 50 MG tablet  [id number] Yes Take 50 mg by mouth every 8 (eight) hours as needed. [provider] Taking Active   TRUE METRIX BLOOD GLUCOSE TEST test strip [id number]  Check blood sugar 2 times a day Olin Hauser, DO  Active               Assessment/Plan:   Diabetes: - Currently controlled - Recommend to continue to monitor home blood sugar and to contact office if needed for readings outside of established parameters or new symptoms  Hypertension: - Advise patient to STOP lisinopril 10 mg and START lisinopril 5 mg daily (new Rx sent to pharmacy by CPP today) due to hypotension as noted by PCP at 10/11 Office Visit - Again counsel patient on importance of taking positional changes slowly - Patient to continue home BP monitoring, keep log of results, bring record to upcoming appointment with PCP and call providers for readings outside of established parameters,  dizziness or new symptoms    Medication Management/Possible Side Effect Question: - Comprehensive medication review performed; medication list updated in electronic medical record Caution patient for risk of dizziness/sedation with alprazolam, gabapentin, hydroxyzine tizanidine and tramadol particularly when these medications taken in combination Denies dizziness/sedation, but does admit to recent trouble with his balance - Note patient recently had foot surgery - Advise patient to use hydroxyzine 25 mg only as prescribed - only 1 tablet at a time (up to twice daily) as needed for anxiety, rather than for sleep due to lack of benefit to sleep particularly with chronic use, contribution to dry mouth side effect and potential to cause additional side effects  - Counsel patient on strategies to improve dry mouth, including staying well hydrated, chewing gum and use of Biotene products -Advise patient to schedule appointment with PCP regarding his balance as well as let psychiatrist and other providers know about his balance  concerns - Patient to continue to work with psychiatrist on adjusting medications to improve patient's ability to concentrate. - Patient states he will pick up and try taking trazodone 50 mg nightly as needed as prescribed by psychiatrist on 10/10   Follow Up Plan: Clinic Pharmacist will follow up with patient by telephone on 08/26/2022 at 1:00 PM  Wallace Cullens, PharmD, Para [place], [place] [phone]

## 2022-08-19 NOTE — Patient Instructions (Signed)
Goals Addressed             This Visit's Progress    Pharmacy Goals       Our goal A1c is less than 7%. This corresponds with fasting sugars less than 130 and 2 hour after meal sugars less than 180. Please check your blood sugar and keep a log of the results  Please check your blood pressure, keep record of results and bring this record with you to medical appointments.  Our goal bad cholesterol, or LDL, is less than 70 . This is why it is important to continue taking your atorvastatin  Feel free to call me with any questions or concerns. I look forward to our next call!   Kimiye Strathman Tanisia Yokley, PharmD, BCACP, CPP Clinical Pharmacist [facility] [phone]         

## 2022-08-22 LAB — HM DIABETES EYE EXAM

## 2022-08-23 DIAGNOSIS — G8929 Other chronic pain: Secondary | ICD-10-CM | POA: Diagnosis not present

## 2022-08-23 DIAGNOSIS — Z79899 Other long term (current) drug therapy: Secondary | ICD-10-CM | POA: Diagnosis not present

## 2022-08-23 DIAGNOSIS — M25569 Pain in unspecified knee: Secondary | ICD-10-CM | POA: Diagnosis not present

## 2022-08-23 DIAGNOSIS — R413 Other amnesia: Secondary | ICD-10-CM | POA: Diagnosis not present

## 2022-08-23 DIAGNOSIS — R251 Tremor, unspecified: Secondary | ICD-10-CM | POA: Diagnosis not present

## 2022-08-23 DIAGNOSIS — R2689 Other abnormalities of gait and mobility: Secondary | ICD-10-CM | POA: Diagnosis not present

## 2022-08-23 DIAGNOSIS — F32A Depression, unspecified: Secondary | ICD-10-CM | POA: Diagnosis not present

## 2022-08-26 ENCOUNTER — Other Ambulatory Visit: Payer: Self-pay | Admitting: Student

## 2022-08-26 ENCOUNTER — Other Ambulatory Visit (HOSPITAL_COMMUNITY): Payer: Self-pay | Admitting: Student

## 2022-08-26 ENCOUNTER — Ambulatory Visit: Payer: Medicare Other | Admitting: Pharmacist

## 2022-08-26 DIAGNOSIS — M19071 Primary osteoarthritis, right ankle and foot: Secondary | ICD-10-CM | POA: Diagnosis not present

## 2022-08-26 DIAGNOSIS — R2689 Other abnormalities of gait and mobility: Secondary | ICD-10-CM

## 2022-08-26 DIAGNOSIS — S92521D Displaced fracture of medial phalanx of right lesser toe(s), subsequent encounter for fracture with routine healing: Secondary | ICD-10-CM | POA: Diagnosis not present

## 2022-08-26 DIAGNOSIS — I10 Essential (primary) hypertension: Secondary | ICD-10-CM

## 2022-08-26 NOTE — Patient Instructions (Signed)
Goals Addressed             This Visit's Progress    Pharmacy Goals       Our goal A1c is less than 7%. This corresponds with fasting sugars less than 130 and 2 hour after meal sugars less than 180. Please check your blood sugar and keep a log of the results  Please check your blood pressure, keep record of results and bring this record with you to medical appointments.  Our goal bad cholesterol, or LDL, is less than 70 . This is why it is important to continue taking your atorvastatin  Feel free to call me with any questions or concerns. I look forward to our next call!   Jilliana Burkes Ananth Fiallos, PharmD, BCACP, CPP Clinical Pharmacist [facility] [phone]         

## 2022-08-26 NOTE — Chronic Care Management (AMB) (Signed)
08/26/2022 Name: Dale [name]. MRN: [id number] DOB: 1961-03-24  Chief Complaint  Patient presents with   Medication Management    Dale [name]. is a 61 y.o. year old male who presented for a telephone visit.   They were referred to the pharmacist by their PCP for assistance in managing diabetes, hypertension, and complex medication management.    Subjective:  Care Team: Primary Care Provider: Olin Hauser, DO ; Next Scheduled Visit: 02/13/2023 Neurologist: Ray Church, MD; Next Scheduled Visit: 11/22/2022 Psychiatrist: Georgia Dom, MD; Next Scheduled Visit: 09/24/2022 Pain Management: [facility]; Next Scheduled Visit: 08/27/2022 Urology: [facility]; Next Scheduled Visit: 09/02/2022 Orthopedics: [employer]; Next Scheduled Visit: 10/10/2022   Perform chart review. [facility] on 08/23/2022. Provider advised patient: - Continue Propranolol 40 three times a day  - Order placed for MRI Brain without contrast + NeuroQuant   - Order placed for physical therapy - Recommendation for neuropsychological evaluation   Office Visit with [place] today. Provider advise patient to start clindamycin 300 mg three times a day for 5 days   Medication Access/Adherence  Current Pharmacy:  [id number] [place] [place] [address]Phone: [phone]: [phone] [facility] ([place] [facility] [place]Phone: [phone]: [address] [address]Phone: [phone]: [phone]  Patient reports affordability concerns with their medications: Yes  Patient reports access/transportation concerns to their pharmacy: No  Patient reports  adherence concerns with their medications:  No     Hypertension:   Current medications:  Lisinopril 5 mg daily Propranolol IR 458mthree times daily   Medications previously tried: HCTZ (stopped due to hypotension)   Patient has an automated, upper arm home BP cuff  Recent home blood pressure readings: today: 121/84, HR 66   Denies symptoms of hypotension, such as dizziness or lightheadedness since decreasing lisinopril dose   Denies any falls in past week   Current physical activity: Reports limited by recent foot surgery    Medication Management/Possible Side Effect Question:   Note previously followed up with patient regarding salty taste behind his teeth for ~1 year Reports has discussed with his dentist, but they could not identify a cause   Reports, as recommended during our previous phone call, no longer taking hydroxyzine 25 mg - 2 tablets (50 mg) QHS as needed for sleep, now taking hydroxyzine 25 mg only as needed (up to twice daily) for anxiety  Denies currently having dry mouth or salty taste behind his teeth since reduced hydroxyzine intake   Reports picked up and started trazodone 50 mg nightly prn as prescribed by psychiatrist. Reports trazodone helping with sleep.    Health Maintenance  Health Maintenance Due  Topic Date Due   Diabetic kidney evaluation - Urine ACR  06/25/2016   COVID-19 Vaccine (5 - Moderna risk series) 10/15/2021     Objective: Lab Results  Component Value Date   HGBA1C 6.0 (H) 08/07/2022    Lab Results  Component Value Date   CREATININE 0.89 08/07/2022   BUN 18 08/07/2022   NA 140 08/07/2022   K 5.1 08/07/2022   CL 100 08/07/2022   CO2 31 08/07/2022    Lab Results  Component Value Date   CHOL 123 08/07/2022   HDL 38 (L) 08/07/2022   LDLCALC 62 08/07/2022   TRIG 145 08/07/2022   CHOLHDL 3.2 08/07/2022   BP Readings from Last 3 Encounters:  08/14/22 (!) 91/56  06/28/22 134/70  04/04/22 106/72   Pulse Readings from  Last 3 Encounters:  08/14/22 63  04/04/22 80  03/28/22 69     Medications Reviewed Today     Reviewed by [name], RPH-CPP (Pharmacist) on 08/26/22 at 1726  Med List Status: <None>   Medication Order Taking? Sig Documenting Provider Last Dose Status Informant  acetaminophen (TYLENOL) 500 MG tablet [id number]  Take 500 mg by mouth 2 (two) times daily as needed. [provider]  Active   ALPRAZolam Duanne Moron) 1 MG tablet [id number]  Take 1 mg by mouth 3 (three) times daily as needed. Sherri Rad, MD  Active   aspirin EC 81 MG tablet [id number]  Take 81 mg by mouth daily. [provider]  Active   atorvastatin (LIPITOR) 10 MG tablet [id number]  TAKE 1 TABLET BY MOUTH  DAILY AT 2 PM Karamalegos, Devonne Doughty, DO  Active   b complex vitamins capsule [id number]  Take by mouth. [provider]  Active   Blood Glucose Monitoring Suppl (TRUE METRIX AIR GLUCOSE METER) w/Device KIT [id number]  Check blood sugar 2 times daily Karamalegos, Devonne Doughty, DO  Active   buPROPion (WELLBUTRIN XL) 300 MG 24 hr tablet [id number]  Take 1 tablet (300 mg total) by mouth daily. Olin Hauser, DO  Active   CINNAMON PO [id number]  Take by mouth. [provider]  Active   clindamycin (CLEOCIN) 300 MG capsule [id number]  Take 300 mg by mouth 3 (three) times daily. [provider]  Active   Coenzyme Q10 100 MG TABS [id number]  Take 100 mg by mouth daily. [provider]  Active   cyanocobalamin 1000 MCG tablet [id number]  Take 1 tablet by mouth daily. [provider]  Active   desoximetasone (TOPICORT) 0.25 % cream [id number]  Apply 1 Application topically 2 (two) times daily as needed. Olin Hauser, DO  Active   diclofenac sodium (VOLTAREN) 1 % GEL [id number]  Apply topically. [provider]  Active   DULoxetine (CYMBALTA) 60 MG capsule [id number]  Take 1 capsule (60 mg total) by mouth Two (2) times a day. Morning and early  afternoon [provider]  Active   ergocalciferol (VITAMIN D2) 1.25 MG (50000 UT) capsule [id number]  Take 50,000 Units by mouth once a week. [provider]  Active   gabapentin (NEURONTIN) 300 MG capsule [id number]  Take 3 capsules by mouth 3 (three) times daily. [provider]  Active            Med Note [name], Ardenia Stiner A   Wed Nov 08, 2020  4:56 PM) Reports taking 900 mg in the morning and at noon, and take 1200 mg (4 capsules) nightly as directed by [facility]  Glucosamine-Chondroitin 250-200 MG TABS [id number]  Take by mouth. [provider]  Active   hydrOXYzine (ATARAX) 25 MG tablet [id number]  Take 25 mg by mouth 2 (two) times daily as needed. [provider]  Active   ibuprofen (ADVIL) 800 MG tablet [id number]  Take 800 mg by mouth 2 (two) times daily as needed. [provider]  Active   lisinopril (ZESTRIL) 5 MG tablet [id number] Yes Take 1 tablet (5 mg total) by mouth daily. [name]  J, DO Taking Active   metFORMIN (GLUCOPHAGE) 1000 MG tablet [id number]  TAKE 1 TABLET (1,000 MG TOTAL) BY MOUTH TWICE A DAY WITH FOOD Parks Ranger, Devonne Doughty, DO  Active   Multiple Vitamin (MULTIVITAMIN) tablet [id number]  Take 1 tablet by mouth daily. [provider]  Active   Omega-3 Fatty Acids (FISH OIL) 1200 MG CAPS [id number]  Take 1 capsule (1,200 mg total) by mouth daily.  Patient taking differently: Take 1,200 mg by mouth 2 (two) times daily.   Karamalegos, Devonne Doughty, DO  Active   OZEMPIC, 1 MG/DOSE, 4 MG/3ML SOPN [id number]  Inject 1 mg into the skin once a week.  Patient not taking: Reported on 08/19/2022   Olin Hauser, DO  Active   pantoprazole (PROTONIX) 40 MG tablet [id number]  Take 40 mg by mouth in the morning. [provider]  Active   polyethylene glycol (MIRALAX / GLYCOLAX) 17 g packet [id number]  Take 17 g by mouth daily as needed.  Patient not taking: Reported on 08/19/2022    [provider]  Active   propranolol (INDERAL) 40 MG tablet [id number] Yes TAKE 1 TABLET BY MOUTH THREE TIMES A DAY Olin Hauser, DO Taking Active   tamsulosin (FLOMAX) 0.4 MG CAPS capsule [id number]  TAKE 2 CAPSULES BY MOUTH EVERY DAY Karamalegos, Devonne Doughty, DO  Active   testosterone cypionate (DEPOTESTOSTERONE CYPIONATE) 200 MG/ML injection [id number]  INJECT 1 ML (200 MG TOTAL) INTO THE MUSCLE EVERY 14 DAYS Stoioff, Scott C, MD  Active   tiZANidine (ZANAFLEX) 2 MG tablet [id number]  Take 2 mg by mouth at bedtime as needed. [provider]  Active   tiZANidine (ZANAFLEX) 4 MG tablet [id number]  1 tablet twice daily, May take an additional 1/2 tablet at bedtime as needed [provider]  Active   traMADol (ULTRAM) 50 MG tablet [id number]  Take 50 mg by mouth every 8 (eight) hours as needed. [provider]  Active   traZODone (DESYREL) 50 MG tablet [id number]  Take 50 mg by mouth at bedtime as needed. [provider]  Active   TRUE METRIX BLOOD GLUCOSE TEST test strip [id number]  Check blood sugar 2 times a day Olin Hauser, DO  Active               Assessment/Plan:   Hypertension: - Patient to continue home BP monitoring, keep log of results, have record to review at medical appointments and call providers for readings outside of established parameters, dizziness or new symptoms     Medication Management/Possible Side Effect Question: - Patient to continue to use hydroxyzine 25 mg only as prescribed - only 1 tablet at a time as needed for anxiety (up to twice daily), rather than for sleep due to lack of benefit to sleep particularly with chronic use, contribution to dry mouth side effect and potential to cause additional side effects - Counsel on importance of taking trazodone 50 mg QHS dose right before bedtime and then laying down after taking to reduce risk of falling - Patient to continue to work with psychiatrist on  adjusting medications to improve his ability to concentrate.   Follow Up Plan: Clinical Pharmacist to follow up with patient by telephone again on 10/16/2022 at 11:15 AM  Wallace Cullens, PharmD, Para [place], [facility] [phone]

## 2022-08-27 DIAGNOSIS — M542 Cervicalgia: Secondary | ICD-10-CM | POA: Diagnosis not present

## 2022-08-27 DIAGNOSIS — M961 Postlaminectomy syndrome, not elsewhere classified: Secondary | ICD-10-CM | POA: Diagnosis not present

## 2022-08-29 ENCOUNTER — Other Ambulatory Visit: Payer: Self-pay | Admitting: *Deleted

## 2022-08-29 DIAGNOSIS — N401 Enlarged prostate with lower urinary tract symptoms: Secondary | ICD-10-CM

## 2022-08-29 DIAGNOSIS — E291 Testicular hypofunction: Secondary | ICD-10-CM

## 2022-08-30 ENCOUNTER — Other Ambulatory Visit: Payer: Medicare Other

## 2022-09-02 ENCOUNTER — Encounter: Payer: Self-pay | Admitting: Urology

## 2022-09-02 ENCOUNTER — Ambulatory Visit: Payer: Medicare Other | Admitting: Urology

## 2022-09-02 DIAGNOSIS — N401 Enlarged prostate with lower urinary tract symptoms: Secondary | ICD-10-CM

## 2022-09-02 DIAGNOSIS — R3911 Hesitancy of micturition: Secondary | ICD-10-CM | POA: Diagnosis not present

## 2022-09-02 DIAGNOSIS — N138 Other obstructive and reflux uropathy: Secondary | ICD-10-CM

## 2022-09-02 DIAGNOSIS — E291 Testicular hypofunction: Secondary | ICD-10-CM

## 2022-09-02 MED ORDER — TAMSULOSIN HCL 0.4 MG PO CAPS: 0.8000 mg | ORAL_CAPSULE | Freq: Every day | ORAL | 1 refills | Status: DC

## 2022-09-02 MED ORDER — TESTOSTERONE CYPIONATE 200 MG/ML IM SOLN: INTRAMUSCULAR | 3 refills | Status: DC

## 2022-09-02 NOTE — Progress Notes (Signed)
09/02/2022 10:10 AM   Dale [name]. 1961/03/13 [id number]  Referring provider: Olin Hauser, DO [address] [place],  [place] [address]  Chief Complaint  Patient presents with   Benign Prostatic Hypertrophy    Urologic history: 1.  Hypogonadism Testosterone cypionate every 2 weeks   2.  BPH with LUTS Tamsulosin 0.8 mg daily  3.  Erectile dysfunction   HPI: 61 y.o. male presents for annual follow-up.  Remains on testosterone cypionate Due for injection tonight Last labs 03/07/2022: Testosterone 184 ng/dL, hematocrit 50.3 Called for acute visit 04/04/2022 for worsening lower urinary tract symptoms.  Had been off tamsulosin which was restarted by PCP and was also prescribed Myrbetriq however is no longer taking this medication due to expense.  Does have urinary frequency and urgency   PMH: Past Medical History:  Diagnosis Date   ADHD (attention deficit hyperactivity disorder)    Anxiety    Coronary artery disease    Coronary artery disease    Coronary atherosclerosis    Depression    GERD (gastroesophageal reflux disease)    Glaucoma    Headache    migrane   Heart disease    Hyperlipidemia    Hypertension    Hypertriglyceridemia    Hypogonadism in male    MVP (mitral valve prolapse)    Narcolepsy and cataplexy    Non-alcoholic fatty liver disease     Surgical History: Past Surgical History:  Procedure Laterality Date   COLONOSCOPY WITH PROPOFOL N/A 11/18/2018   Procedure: COLONOSCOPY WITH PROPOFOL;  Surgeon: Manya Silvas, MD;  [facility] END[facility]COPY;  Service: Endoscopy;  Laterality: N/A;   ESOPHAGOGASTRODUODENOSCOPY (EGD) WITH PROPOFOL N/A 11/18/2018   Procedure: ESOPHAGOGASTRODUODENOSCOPY (EGD) WITH PROPOFOL;  Surgeon: Manya Silvas, MD;  [facility];  Service: Endoscopy;  Laterality: N/A;   pain pump implant and removal Bilateral    Dec. 21 2019   SPINE SURGERY     Multiple surgeries, initial 2001, thoracic disc  repair and recurrent revisions.   THROAT SURGERY      Home Medications:  Allergies as of 09/02/2022       Reactions   Benzoin Other (See Comments)   blisters   Doxycycline Hyclate Nausea Only   Morphine Other (See Comments)   Penicillins Other (See Comments)        Medication List        Accurate as of September 02, 2022 10:10 AM. If you have any questions, ask your nurse or doctor.          acetaminophen 500 MG tablet Commonly known as: TYLENOL Take 500 mg by mouth 2 (two) times daily as needed.   ALPRAZolam 1 MG tablet Commonly known as: XANAX Take 1 mg by mouth 3 (three) times daily as needed.   aspirin EC 81 MG tablet Take 81 mg by mouth daily.   atorvastatin 10 MG tablet Commonly known as: LIPITOR TAKE 1 TABLET BY MOUTH  DAILY AT 2 PM   b complex vitamins capsule Take by mouth.   buPROPion 300 MG 24 hr tablet Commonly known as: WELLBUTRIN XL Take 1 tablet (300 mg total) by mouth daily.   CINNAMON PO Take by mouth.   clindamycin 300 MG capsule Commonly known as: CLEOCIN Take 300 mg by mouth 3 (three) times daily.   Coenzyme Q10 100 MG Tabs Take 100 mg by mouth daily.   cyanocobalamin 1000 MCG tablet Take 1 tablet by mouth daily.   desoximetasone 0.25 % cream  Commonly known as: TOPICORT Apply 1 Application topically 2 (two) times daily as needed.   diclofenac sodium 1 % Gel Commonly known as: VOLTAREN Apply topically.   DULoxetine 60 MG capsule Commonly known as: CYMBALTA Take 1 capsule (60 mg total) by mouth Two (2) times a day. Morning and early afternoon   ergocalciferol 1.25 MG (50000 UT) capsule Commonly known as: VITAMIN D2 Take 50,000 Units by mouth once a week.   Fish Oil 1200 MG Caps Take 1 capsule (1,200 mg total) by mouth daily. What changed: when to take this   gabapentin 300 MG capsule Commonly known as: NEURONTIN Take 3 capsules by mouth 3 (three) times daily.   Glucosamine-Chondroitin 250-200 MG Tabs Take by  mouth.   hydrOXYzine 25 MG tablet Commonly known as: ATARAX Take 25 mg by mouth 2 (two) times daily as needed.   ibuprofen 800 MG tablet Commonly known as: ADVIL Take 800 mg by mouth 2 (two) times daily as needed.   lisinopril 5 MG tablet Commonly known as: ZESTRIL Take 1 tablet (5 mg total) by mouth daily.   metFORMIN 1000 MG tablet Commonly known as: GLUCOPHAGE TAKE 1 TABLET (1,000 MG TOTAL) BY MOUTH TWICE A DAY WITH FOOD   multivitamin tablet Take 1 tablet by mouth daily.   Ozempic (1 MG/DOSE) 4 MG/3ML Sopn Generic drug: Semaglutide (1 MG/DOSE) Inject 1 mg into the skin once a week.   pantoprazole 40 MG tablet Commonly known as: PROTONIX Take 40 mg by mouth in the morning.   polyethylene glycol 17 g packet Commonly known as: MIRALAX / GLYCOLAX Take 17 g by mouth daily as needed.   propranolol 40 MG tablet Commonly known as: INDERAL TAKE 1 TABLET BY MOUTH THREE TIMES A DAY   tamsulosin 0.4 MG Caps capsule Commonly known as: FLOMAX TAKE 2 CAPSULES BY MOUTH EVERY DAY   testosterone cypionate 200 MG/ML injection Commonly known as: DEPOTESTOSTERONE CYPIONATE INJECT 1 ML (200 MG TOTAL) INTO THE MUSCLE EVERY 14 DAYS   tiZANidine 4 MG tablet Commonly known as: ZANAFLEX 1 tablet twice daily, May take an additional 1/2 tablet at bedtime as needed   tiZANidine 2 MG tablet Commonly known as: ZANAFLEX Take 2 mg by mouth at bedtime as needed.   traMADol 50 MG tablet Commonly known as: ULTRAM Take 50 mg by mouth every 8 (eight) hours as needed.   traZODone 50 MG tablet Commonly known as: DESYREL Take 50 mg by mouth at bedtime as needed.   True Metrix Air Glucose Meter w/Device Kit Check blood sugar 2 times daily   True Metrix Blood Glucose Test test strip Generic drug: glucose blood Check blood sugar 2 times a day        Allergies:  Allergies  Allergen Reactions   Benzoin Other (See Comments)    blisters   Doxycycline Hyclate Nausea Only   Morphine  Other (See Comments)   Penicillins Other (See Comments)    Family History: Family History  Problem Relation Age of Onset   Depression Mother    Anxiety disorder Mother    Diabetes Mother    Heart failure Mother    Heart attack Father    Diabetes Sister    Obesity Sister    Drug abuse Brother    Alcohol abuse Brother    Depression Sister    Anxiety disorder Sister    Drug abuse Sister    Anxiety disorder Sister    Seizures Sister    Diabetes Sister      Depression Sister     Social History:  reports that he quit smoking about 11 years ago. His smoking use included cigarettes. He started smoking about 43 years ago. He quit smokeless tobacco use about 11 years ago. He reports that he does not drink alcohol and does not use drugs.   Physical Exam: BP 128/74   Pulse 80   Ht 6' (1.829 m)   Wt 213 lb (96.6 kg)   BMI 28.89 kg/m   Constitutional:  Alert and oriented, No acute distress. HEENT: [place] AT Respiratory: Normal respiratory effort, no increased work of breathing. Psychiatric: Normal mood and affect.   Assessment & Plan:    1.  Hypogonadism Last injection 2 weeks ago Trough level testosterone, PSA and hematocrit drawn today If stable lab visit 6 months testosterone/hematocrit and office visit 1 year for DRE and testosterone/hematocrit/PSA Testosterone refilled   2.  BPH with LUTS Refill tamsulosin 0.8 mg  Myrbetriq effective for storage related symptoms however cost prohibitive Discussed adding an anticholinergic medication however he does not desire due to the possible side effects of dry mouth and constipation   Abbie Sons, MD  [place] [address] [place], [place] [address] [phone]

## 2022-09-03 LAB — PSA: Prostate Specific Ag, Serum: 0.4 ng/mL (ref 0.0–4.0)

## 2022-09-03 LAB — TESTOSTERONE: Testosterone: 67 ng/dL — ABNORMAL LOW (ref 264–916)

## 2022-09-03 LAB — HEMATOCRIT: Hematocrit: 44.3 % (ref 37.5–51.0)

## 2022-09-06 ENCOUNTER — Telehealth: Payer: Self-pay | Admitting: Family Medicine

## 2022-09-06 NOTE — Telephone Encounter (Signed)
-----   Message from Abbie Sons, MD sent at 09/06/2022  7:11 AM EDT ----- Trough testosterone was low at 67.  Depending on his symptoms we can increase his dose.  PSA was stable and hematocrit normal

## 2022-09-06 NOTE — Telephone Encounter (Signed)
Patient notified and is doing on fine on current dose.  Patient states he had a CT scan a couple years ago on his kidneys. He states he has several cysts and is supposed to be getting scans done to watch the cysts every 6 months. He wants to know who he should be seeing for this. The last person that ordered it is no longer working? Please advise.

## 2022-09-07 DIAGNOSIS — K219 Gastro-esophageal reflux disease without esophagitis: Secondary | ICD-10-CM | POA: Diagnosis not present

## 2022-09-07 DIAGNOSIS — I10 Essential (primary) hypertension: Secondary | ICD-10-CM | POA: Diagnosis not present

## 2022-09-07 DIAGNOSIS — E559 Vitamin D deficiency, unspecified: Secondary | ICD-10-CM | POA: Diagnosis not present

## 2022-09-07 DIAGNOSIS — E1169 Type 2 diabetes mellitus with other specified complication: Secondary | ICD-10-CM | POA: Diagnosis not present

## 2022-09-07 DIAGNOSIS — Z880 Allergy status to penicillin: Secondary | ICD-10-CM | POA: Diagnosis not present

## 2022-09-07 DIAGNOSIS — Z888 Allergy status to other drugs, medicaments and biological substances status: Secondary | ICD-10-CM | POA: Diagnosis not present

## 2022-09-07 DIAGNOSIS — Z885 Allergy status to narcotic agent status: Secondary | ICD-10-CM | POA: Diagnosis not present

## 2022-09-07 DIAGNOSIS — G8929 Other chronic pain: Secondary | ICD-10-CM | POA: Diagnosis not present

## 2022-09-07 DIAGNOSIS — T8484XA Pain due to internal orthopedic prosthetic devices, implants and grafts, initial encounter: Secondary | ICD-10-CM | POA: Diagnosis not present

## 2022-09-07 DIAGNOSIS — E1122 Type 2 diabetes mellitus with diabetic chronic kidney disease: Secondary | ICD-10-CM | POA: Diagnosis not present

## 2022-09-07 DIAGNOSIS — M7989 Other specified soft tissue disorders: Secondary | ICD-10-CM | POA: Diagnosis not present

## 2022-09-07 DIAGNOSIS — E785 Hyperlipidemia, unspecified: Secondary | ICD-10-CM | POA: Diagnosis not present

## 2022-09-07 DIAGNOSIS — T84213A Breakdown (mechanical) of internal fixation device of bones of foot and toes, initial encounter: Secondary | ICD-10-CM | POA: Diagnosis not present

## 2022-09-07 DIAGNOSIS — L539 Erythematous condition, unspecified: Secondary | ICD-10-CM | POA: Diagnosis not present

## 2022-09-07 DIAGNOSIS — G4700 Insomnia, unspecified: Secondary | ICD-10-CM | POA: Diagnosis not present

## 2022-09-07 DIAGNOSIS — S92901A Unspecified fracture of right foot, initial encounter for closed fracture: Secondary | ICD-10-CM | POA: Diagnosis not present

## 2022-09-07 DIAGNOSIS — E11628 Type 2 diabetes mellitus with other skin complications: Secondary | ICD-10-CM | POA: Diagnosis not present

## 2022-09-07 DIAGNOSIS — M2041 Other hammer toe(s) (acquired), right foot: Secondary | ICD-10-CM | POA: Diagnosis not present

## 2022-09-07 DIAGNOSIS — E1159 Type 2 diabetes mellitus with other circulatory complications: Secondary | ICD-10-CM | POA: Diagnosis not present

## 2022-09-07 DIAGNOSIS — M79671 Pain in right foot: Secondary | ICD-10-CM | POA: Diagnosis not present

## 2022-09-07 DIAGNOSIS — F32A Depression, unspecified: Secondary | ICD-10-CM | POA: Diagnosis not present

## 2022-09-07 DIAGNOSIS — M25571 Pain in right ankle and joints of right foot: Secondary | ICD-10-CM | POA: Diagnosis not present

## 2022-09-07 DIAGNOSIS — S92511D Displaced fracture of proximal phalanx of right lesser toe(s), subsequent encounter for fracture with routine healing: Secondary | ICD-10-CM | POA: Diagnosis not present

## 2022-09-07 DIAGNOSIS — Z87891 Personal history of nicotine dependence: Secondary | ICD-10-CM | POA: Diagnosis not present

## 2022-09-07 DIAGNOSIS — E119 Type 2 diabetes mellitus without complications: Secondary | ICD-10-CM | POA: Diagnosis not present

## 2022-09-07 DIAGNOSIS — T8143XA Infection following a procedure, organ and space surgical site, initial encounter: Secondary | ICD-10-CM | POA: Diagnosis not present

## 2022-09-07 DIAGNOSIS — T8141XA Infection following a procedure, superficial incisional surgical site, initial encounter: Secondary | ICD-10-CM | POA: Diagnosis not present

## 2022-09-07 DIAGNOSIS — L03115 Cellulitis of right lower limb: Secondary | ICD-10-CM | POA: Diagnosis not present

## 2022-09-07 DIAGNOSIS — R251 Tremor, unspecified: Secondary | ICD-10-CM | POA: Diagnosis not present

## 2022-09-07 DIAGNOSIS — M961 Postlaminectomy syndrome, not elsewhere classified: Secondary | ICD-10-CM | POA: Diagnosis not present

## 2022-09-07 DIAGNOSIS — I2510 Atherosclerotic heart disease of native coronary artery without angina pectoris: Secondary | ICD-10-CM | POA: Diagnosis not present

## 2022-09-07 DIAGNOSIS — L03031 Cellulitis of right toe: Secondary | ICD-10-CM | POA: Diagnosis not present

## 2022-09-08 NOTE — Telephone Encounter (Signed)
Cyst seen on prior CT were simple renal cysts and surveillance imaging is not recommended.

## 2022-09-09 NOTE — Telephone Encounter (Signed)
[facility] informed patient CT scan is not recommended.

## 2022-09-13 ENCOUNTER — Telehealth: Payer: Self-pay

## 2022-09-13 NOTE — Patient Outreach (Signed)
  Care Coordination [facility] Note Transition Care Management Unsuccessful Follow-up Telephone Call  Date of discharge and from where:  [facility] 09/07/22-09/12/22  Attempts:  1st Attempt  Reason for unsuccessful TCM follow-up call:  Left voice message  Johnney Killian, RN, BSN, CCM Care Management Coordinator [facility] Phone: [phone]: [phone]

## 2022-09-13 NOTE — Patient Outreach (Signed)
  Care Coordination TOC Note Transition Care Management Follow-up Telephone Call Date of discharge and from where: [facility] 09/07/22-09/12/22 How have you been since you were released from the hospital? "My incision area is sore but I am hanging in there" Any questions or concerns? No  Items Reviewed: Did the pt receive and understand the discharge instructions provided? Yes  Medications obtained and verified? Yes  Other? No  Any new allergies since your discharge? No  Dietary orders reviewed? Yes Do you have support at home?  Spouse is working but assists him as needed  Home Care and Equipment/Supplies: Were home health services ordered? no If so, what is the name of the agency? N/A  Has the agency set up a time to come to the patient's home? no Were any new equipment or medical supplies ordered?  No What is the name of the medical supply agency? N/A Were you able to get the supplies/equipment? not applicable Do you have any questions related to the use of the equipment or supplies? No  Functional Questionnaire: (I = Independent and D = Dependent) ADLs: I  Bathing/Dressing- I  Meal Prep- I  Eating- I  Maintaining continence- I  Transferring/Ambulation- I  Managing Meds- I  Follow up appointments reviewed:  PCP Hospital f/u appt confirmed? Yes  Scheduled to see Dr. Parks Ranger on 09/18/22 @ 11:00. [facility] f/u appt confirmed? No   Are transportation arrangements needed? No  If their condition worsens, is the pt aware to call PCP or go to the Emergency Dept.? Yes Was the patient provided with contact information for the PCP's office or ED? Yes Was to pt encouraged to call back with questions or concerns? Yes  SDOH assessments and interventions completed:   Yes  Care Coordination Interventions Activated:  Yes   Care Coordination Interventions:  No Care Coordination interventions needed at this time.   Encounter Outcome:  Pt. Visit Completed

## 2022-09-18 ENCOUNTER — Ambulatory Visit (INDEPENDENT_AMBULATORY_CARE_PROVIDER_SITE_OTHER): Payer: Medicare Other | Admitting: Family Medicine

## 2022-09-18 ENCOUNTER — Encounter: Payer: Self-pay | Admitting: Family Medicine

## 2022-09-18 VITALS — BP 134/70 | HR 64 | Ht 72.0 in | Wt 210.0 lb

## 2022-09-18 DIAGNOSIS — F988 Other specified behavioral and emotional disorders with onset usually occurring in childhood and adolescence: Secondary | ICD-10-CM | POA: Diagnosis not present

## 2022-09-18 DIAGNOSIS — Z23 Encounter for immunization: Secondary | ICD-10-CM

## 2022-09-18 DIAGNOSIS — L03031 Cellulitis of right toe: Secondary | ICD-10-CM | POA: Diagnosis not present

## 2022-09-18 DIAGNOSIS — T8149XA Infection following a procedure, other surgical site, initial encounter: Secondary | ICD-10-CM

## 2022-09-18 NOTE — Progress Notes (Signed)
Subjective:    Patient ID: Dale [name]., male    DOB: 1961/02/28, 61 y.o.   MRN: [id number]  Dale [name]. is a 61 y.o. male presenting on 09/18/2022 for Hospitalization Follow-up and Cellulitis   HPI  HOSPITAL FOLLOW-UP VISIT  Hospital/Location: [facility] Date of Admission: 09/07/22 Date of Discharge: 09/12/22 Transitions of care telephone call: 09/13/22 Johnney Killian RN  Reason for Admission: Cellulitis   [facility] H&P and Discharge Summary have been reviewed - Patient presents today 6 days after recent hospitalization. Brief summary of recent course, patient had symptoms of Right foot toe cellulitis post op wound, he had original surgery 07/2022, he had repeat ortho surgery with revision 10/23, and ultimately infected. Had 1st MTDP arthrodesis. He was given vancomycin IV, Orthopedics managed, went to ED for I&D debridement.  Non weightbearing on his foot.  Discharged on Keflex and Doxycycline oral course Next apt Ortho in 1 month approximately for staple removal and post op wound management  Upcoming 11/21 [facility] On Adderall 10m AS NEEDED, but seems less effective interested in dose increase  - New medications on discharge: Keflex Doxycycline - Changes to current meds on discharge: None  I have reviewed the discharge medication list, and have reconciled the current and discharge medications today.  Note need Prevnar-20 today, final dose, no more dose needed after 65+, already did Pneumovax 23 in 2017   Current Outpatient Medications:    acetaminophen (TYLENOL) 500 MG tablet, Take 500 mg by mouth 2 (two) times daily as needed., Disp: , Rfl:    ALPRAZolam (XANAX) 1 MG tablet, Take 1 mg by mouth 3 (three) times daily as needed., Disp: 90 tablet, Rfl: 0   aspirin EC 81 MG tablet, Take 81 mg by mouth daily., Disp: , Rfl:    atorvastatin (LIPITOR) 10 MG tablet, TAKE 1 TABLET BY MOUTH  DAILY AT 2 PM, Disp: 90 tablet, Rfl: 3    b complex vitamins capsule, Take by mouth., Disp: , Rfl:    Blood Glucose Monitoring Suppl (TRUE METRIX AIR GLUCOSE METER) w/Device KIT, Check blood sugar 2 times daily, Disp: 1 kit, Rfl: 0   buPROPion (WELLBUTRIN XL) 300 MG 24 hr tablet, Take 1 tablet (300 mg total) by mouth daily., Disp: 90 tablet, Rfl: 3   CINNAMON PO, Take by mouth., Disp: , Rfl:    clindamycin (CLEOCIN) 300 MG capsule, Take 300 mg by mouth 3 (three) times daily., Disp: , Rfl:    Coenzyme Q10 100 MG TABS, Take 100 mg by mouth daily., Disp: , Rfl:    cyanocobalamin 1000 MCG tablet, Take 1 tablet by mouth daily., Disp: , Rfl:    desoximetasone (TOPICORT) 0.25 % cream, Apply 1 Application topically 2 (two) times daily as needed., Disp: 60 g, Rfl: 2   diclofenac sodium (VOLTAREN) 1 % GEL, Apply topically., Disp: , Rfl:    DULoxetine (CYMBALTA) 60 MG capsule, Take 1 capsule (60 mg total) by mouth Two (2) times a day. Morning and early afternoon, Disp: , Rfl:    gabapentin (NEURONTIN) 300 MG capsule, Take 3 capsules by mouth 3 (three) times daily., Disp: , Rfl:    Glucosamine-Chondroitin 250-200 MG TABS, Take by mouth., Disp: , Rfl:    hydrOXYzine (ATARAX) 25 MG tablet, Take 25 mg by mouth 2 (two) times daily as needed., Disp: , Rfl:    ibuprofen (ADVIL) 800 MG tablet, Take 800 mg by mouth 2 (two) times daily as needed., Disp: ,  Rfl:    lisinopril (ZESTRIL) 5 MG tablet, Take 1 tablet (5 mg total) by mouth daily., Disp: 90 tablet, Rfl: 3   metFORMIN (GLUCOPHAGE) 1000 MG tablet, TAKE 1 TABLET (1,000 MG TOTAL) BY MOUTH TWICE A DAY WITH FOOD, Disp: 180 tablet, Rfl: 0   Multiple Vitamin (MULTIVITAMIN) tablet, Take 1 tablet by mouth daily., Disp: , Rfl:    pantoprazole (PROTONIX) 40 MG tablet, Take 40 mg by mouth in the morning., Disp: , Rfl: 0   propranolol (INDERAL) 40 MG tablet, TAKE 1 TABLET BY MOUTH THREE TIMES A DAY, Disp: 270 tablet, Rfl: 0   tamsulosin (FLOMAX) 0.4 MG CAPS capsule, Take 2 capsules (0.8 mg total) by mouth daily.,  Disp: 180 capsule, Rfl: 1   testosterone cypionate (DEPOTESTOSTERONE CYPIONATE) 200 MG/ML injection, INJECT 1 ML (200 MG TOTAL) INTO THE MUSCLE EVERY 14 DAYS, Disp: 2 mL, Rfl: 3   tiZANidine (ZANAFLEX) 2 MG tablet, Take 2 mg by mouth at bedtime as needed., Disp: , Rfl:    tiZANidine (ZANAFLEX) 4 MG tablet, 1 tablet twice daily, May take an additional 1/2 tablet at bedtime as needed, Disp: , Rfl:    traMADol (ULTRAM) 50 MG tablet, Take 50 mg by mouth every 8 (eight) hours as needed., Disp: , Rfl:    traZODone (DESYREL) 50 MG tablet, Take 50 mg by mouth at bedtime as needed., Disp: , Rfl:    TRUE METRIX BLOOD GLUCOSE TEST test strip, Check blood sugar 2 times a day, Disp: 200 each, Rfl: 3  ------------------------------------------------------------------------- Social History   Tobacco Use   Smoking status: Former    Types: Cigarettes    Start date: 08/29/1979    Quit date: 11/04/2010    Years since quitting: 11.8   Smokeless tobacco: Former    Quit date: 11/19/2010   Tobacco comments:    1 to 1.5 packs per day  Vaping Use   Vaping Use: Never used  Substance Use Topics   Alcohol use: No    Alcohol/week: 0.0 standard drinks of alcohol   Drug use: No    Review of Systems Per HPI unless specifically indicated above     Objective:    BP (!) 144/69   Pulse 64   Ht 6' (1.829 m)   Wt 210 lb (95.3 kg)   SpO2 98%   BMI 28.48 kg/m   Wt Readings from Last 3 Encounters:  09/18/22 210 lb (95.3 kg)  09/02/22 213 lb (96.6 kg)  08/14/22 208 lb (94.3 kg)    Physical Exam Vitals and nursing note reviewed.  Constitutional:      General: He is not in acute distress.    Appearance: Normal appearance. He is well-developed. He is not diaphoretic.     Comments: Well-appearing, comfortable, cooperative  HENT:     Head: Normocephalic and atraumatic.  Eyes:     General:        Right eye: No discharge.        Left eye: No discharge.     Conjunctiva/sclera: Conjunctivae normal.   Cardiovascular:     Rate and Rhythm: Normal rate.  Pulmonary:     Effort: Pulmonary effort is normal.  Skin:    General: Skin is warm and dry.     Findings: Lesion (Right foot dorsal post-op site removed dressing wrap, has clean dry intact approx 7 cm incisional wound healing well with staples, clean dry and intact, minimal redness and swelling improving) present. No erythema or rash.  Neurological:  Mental Status: He is alert and oriented to person, place, and time.  Psychiatric:        Mood and Affect: Mood normal.        Behavior: Behavior normal.        Thought Content: Thought content normal.     Comments: Well groomed, good eye contact, normal speech and thoughts       Results for orders placed or performed in visit on 09/02/22  Testosterone  Result Value Ref Range   Testosterone 67 (L) 264 - 916 ng/dL  PSA  Result Value Ref Range   Prostate Specific Ag, Serum 0.4 0.0 - 4.0 ng/mL  Hematocrit  Result Value Ref Range   Hematocrit 44.3 37.5 - 51.0 %      Assessment & Plan:   Problem List Items Addressed This Visit   None Visit Diagnoses     Postoperative wound infection    -  Primary   Need for Streptococcus pneumoniae vaccination       Relevant Orders   Pneumococcal conjugate vaccine 20-valent (Completed)   Cellulitis of right toe       Attention deficit disorder (ADD) without hyperactivity           Surgical wound looks great Staples are intact Replaced dressing today with non adherent pad and wrap Follow up with surgeons for post-op staple removal and further management  No further antibiotics or treatments for the wound.  Prevnar-20 vaccine today,. Final dose pneumonia vaccine you will need.  I have corresponded with Dr Laban Emperor about the Adderall to notify them that based on medical history I would be okay with dose increase from Adderall 65m to 141mdaily AS NEEDED  Orders Placed This Encounter  Procedures   Pneumococcal conjugate vaccine  20-valent      No orders of the defined types were placed in this encounter.   Follow up plan: Return if symptoms worsen or fail to improve.  AlNobie PutnamDORidgewayedical Group 09/18/2022, 11:41 AM

## 2022-09-18 NOTE — Patient Instructions (Addendum)
Thank you for coming to the office today.  Surgical wound looks great Staples are intact Replaced dressing today with non adherent pad and wrap Follow up with surgeons for post-op staple removal and further management  No further antibiotics or treatments for the wound.  Prevnar-20 vaccine today,. Final dose pneumonia vaccine you will need.  I have corresponded with Dr Laban Emperor about the Adderall to notify them that based on medical history I would be okay with dose increase from Adderall '5mg'$  to '10mg'$  daily AS NEEDED    Please schedule a Follow-up Appointment to: Return if symptoms worsen or fail to improve.  If you have any other questions or concerns, please feel free to call the office or send a message through [place]. You may also schedule an earlier appointment if necessary.  Additionally, you may be receiving a survey about your experience at our office within a few days to 1 week by e-mail or mail. We value your feedback.  [name], DO [place]

## 2022-10-16 ENCOUNTER — Telehealth: Payer: Self-pay | Admitting: Pharmacist

## 2022-10-16 ENCOUNTER — Ambulatory Visit: Payer: Medicare Other | Admitting: Pharmacist

## 2022-10-16 DIAGNOSIS — Z79899 Other long term (current) drug therapy: Secondary | ICD-10-CM

## 2022-10-16 NOTE — Telephone Encounter (Signed)
Patient returned call. Thank you

## 2022-10-16 NOTE — Telephone Encounter (Signed)
   Outreach Note  10/16/2022 Name: Dale [name]. MRN: [id number] DOB: 11/02/1961  Referred by: Dale Hauser, DO Reason for referral : No chief complaint on file.   Was unable to reach patient via telephone today and have left HIPAA compliant voicemail asking patient to return my call.    Follow Up Plan: Will collaborate with Care Guide to outreach to schedule follow up with me  Wallace Cullens, PharmD, [place] [facility] [phone]

## 2022-10-16 NOTE — Patient Instructions (Signed)
Goals Addressed             This Visit's Progress    Pharmacy Goals       Our goal A1c is less than 7%. This corresponds with fasting sugars less than 130 and 2 hour after meal sugars less than 180. Please check your blood sugar and keep a log of the results  Please check your blood pressure, keep record of results and bring this record with you to medical appointments.  Our goal bad cholesterol, or LDL, is less than 70 . This is why it is important to continue taking your atorvastatin  Feel free to call me with any questions or concerns. I look forward to our next call!   Wallace Cullens, PharmD, [place], CPP Clinical Pharmacist [facility] [phone]

## 2022-10-16 NOTE — Chronic Care Management (AMB) (Signed)
10/16/2022 Name: Dale [name]. MRN: [id number] DOB: [birth year]  Chief Complaint  Patient presents with   Medication Management    Dale [name]. is a 61 y.o. year old male who presented for a telephone visit.   They were referred to the pharmacist by their PCP for assistance in managing diabetes, hypertension, and complex medication management.    Subjective:  Care Team: Primary Care Provider: Olin Hauser, DO ; Next Scheduled Visit: 02/13/2023 Neurologist: Ray Church, MD; Next Scheduled Visit: 11/22/2022 Psychiatrist: Georgia Dom, MD; Next Scheduled Visit: 12/03/2022 Pain Management: [facility]; Next Scheduled Visit: 11/19/2022 Orthopedics: [employer]; Next Scheduled Visit: 10/24/2022 Urology: Abbie Sons, MD; Next Scheduled Visit: 09/03/2023  Perform chart review - Patient seen for Office Visit with [facility] on 11/27 - Office Visit with PCP on 11/15 for Hospitalization Follow-up and Cellulitis  Note patient [facility]/4-11/07/2022 related to cellulitis  Provider advised patient:   Follow up with surgeons for post-op staple removal and further management  - Telemedicine appointment with [facility] on 11/21. Provider advised patient:  Continue Cymbalta 43m nightly (decr from BID)  Continue Wellbutrin XL 3078mdaily to address inattention and low mood  Continue hydroxyzine 2559mID prn anxiety.  Continue alprazolam 0.5mg53mD Increase Adderall IR to 10mg51mly  Medication Access/Adherence  Current Pharmacy:  [id number], [place] [id number]: [phone] ([address] [facility] [address]: [phone] [address]-[address]: [phone] reports affordability concerns with their medications: No  - Ozempic unaffordable to patient when he is in the coverage gap. However, patient does not meet criteria for manufacturer assistance program Patient reports access/transportation concerns to their pharmacy: No  Patient reports adherence concerns with their medications:  No    Hypertension:   Current medications:  Lisinopril 5 mg daily Propranolol IR 40mg t2m times daily   Medications previously tried: HCTZ (stopped due to hypotension)   Patient has an automated, upper arm home BP cuff   Does not have specific readings to review, but recalls recent readings ranging 120s/80s   Denies recent hypotension    Current physical activity: Reports limited by recent foot surgery    Diabetes:   Current medications: metformin 1000 mg twice daily Medications tried in the past: Ozempic (unable to afford when in coverage gap)   Current glucose readings: reports ranging 84-118   Current physical activity: Limited by recent foot surgery   Current medication access support: Based on reported income, patient does not meet criteria for available medication assistance programs    Objective: Lab Results  Component Value Date   HGBA1C 6.0 (H) 08/07/2022    Lab Results  Component Value Date   CREATININE 0.89 08/07/2022   BUN 18 08/07/2022   NA 140 08/07/2022   K 5.1 08/07/2022   CL 100 08/07/2022   CO2 31 08/07/2022    Lab Results  Component Value Date   CHOL 123 08/07/2022   HDL 38 (L) 08/07/2022   LDLCALC 62 08/07/2022   TRIG 145 08/07/2022   CHOLHDL 3.2 08/07/2022   BP Readings from  Last 3 Encounters:  09/18/22 134/70  09/02/22 128/74  08/14/22 (!) 91/56   Pulse Readings from Last 3 Encounters:  09/18/22 64  09/02/22 80  08/14/22 63     Medications Reviewed Today     Reviewed by [name], RPH-CPP (Pharmacist) on 10/16/22 at 1530  Med List Status: <None>   Medication Order Taking? Sig Documenting Provider Last Dose Status Informant  acetaminophen (TYLENOL) 500 MG tablet [id number] Yes Take 500 mg by mouth 2 (two) times daily as needed. [provider] Taking Active   ALPRAZolam Duanne Moron) 0.5 MG tablet [id number] Yes Take 0.5 mg by mouth 2 (two) times daily as needed. [provider] Taking Active   amphetamine-dextroamphetamine (ADDERALL) 10 MG tablet [id number] Yes Take 1 tablet (10 mg total) by mouth in the morning. [provider] Taking Active   aspirin EC 81 MG tablet [id number] Yes Take 81 mg by mouth daily. [provider] Taking Active   atorvastatin (LIPITOR) 10 MG tablet [id number] Yes TAKE 1 TABLET BY MOUTH  DAILY AT 2 PM Karamalegos, Devonne Doughty, DO Taking Active   b complex vitamins capsule [id number] Yes Take by mouth. [provider] Taking Active   Blood Glucose Monitoring Suppl (TRUE METRIX AIR GLUCOSE METER) w/Device KIT [id number]  Check blood sugar 2 times daily Olin Hauser, DO  Active   buPROPion (WELLBUTRIN XL) 300 MG 24 hr tablet [id number] Yes Take 1 tablet (300 mg total) by mouth daily. Olin Hauser, DO Taking Active   Cholecalciferol 25 MCG (1000 UT) TBDP [id number] Yes Take by mouth. [provider] Taking Active   CINNAMON PO [id number] Yes Take by mouth. [provider] Taking Active   Coenzyme Q10 100 MG TABS [id number] Yes Take 100 mg by mouth daily. [provider] Taking Active   cyanocobalamin 1000 MCG tablet [id number] Yes Take 1 tablet by mouth daily. [provider] Taking Active   desoximetasone (TOPICORT) 0.25 % cream [id number] Yes Apply 1 Application topically 2 (two) times daily as needed. Olin Hauser, DO Taking Active   diclofenac sodium (VOLTAREN) 1 % GEL [id number] Yes Apply topically. [provider] Taking Active    DULoxetine (CYMBALTA) 60 MG capsule [id number] Yes Take 60 mg by mouth daily. Take 1 capsule (60 mg total) by mouth Two (2) times a day. Morning and early afternoon [provider] Taking Active   gabapentin (NEURONTIN) 300 MG capsule [id number] Yes Take 3 capsules by mouth 3 (three) times daily. [provider] Taking Active            Med Note [name], [name]   Wed Nov 08, 2020  4:56 PM) Reports taking 900 mg in the morning and at noon, and take 1200 mg (4 capsules) nightly as directed by [facility]  Glucosamine-Chondroitin 250-200 MG TABS [id number] Yes Take by mouth. [provider] Taking Active   hydrOXYzine (ATARAX) 25 MG tablet [id number] Yes Take 25 mg by mouth 2 (two) times daily as needed. [provider] Taking Active   ibuprofen (ADVIL) 800 MG tablet [id number] Yes Take 800 mg by mouth 2 (two) times daily as needed. [provider] Taking Active   lisinopril (ZESTRIL) 5 MG tablet [id number] Yes Take 1 tablet (5 mg total) by mouth daily. Olin Hauser, DO Taking Active   metFORMIN (GLUCOPHAGE) 1000 MG tablet [id number] Yes TAKE 1 TABLET (1,000 MG TOTAL) BY MOUTH TWICE A DAY WITH FOOD Parks Ranger Devonne Doughty, DO Taking Active  Multiple Vitamin (MULTIVITAMIN) tablet [id number] Yes Take 1 tablet by mouth daily. [provider] Taking Active   Omega-3 Fatty Acids (FISH OIL) 1000 MG CAPS [id number] Yes Take by mouth. [provider] Taking Active   pantoprazole (PROTONIX) 40 MG tablet [id number] Yes Take 40 mg by mouth 2 (two) times daily before a meal. [provider] Taking Active   propranolol (INDERAL) 40 MG tablet [id number] Yes TAKE 1 TABLET BY MOUTH THREE TIMES A DAY Karamalegos, Devonne Doughty, DO Taking Active   tamsulosin (FLOMAX) 0.4 MG CAPS capsule [id number] Yes Take 2 capsules (0.8 mg total) by mouth daily. Abbie Sons, MD Taking Active   testosterone cypionate (DEPOTESTOSTERONE  CYPIONATE) 200 MG/ML injection [id number] Yes INJECT 1 ML (200 MG TOTAL) INTO THE MUSCLE EVERY 14 DAYS Stoioff, Scott C, MD Taking Active   tiZANidine (ZANAFLEX) 2 MG tablet [id number] Yes Take 2 mg by mouth at bedtime as needed. [provider] Taking Active   tiZANidine (ZANAFLEX) 4 MG tablet [id number] Yes 1 tablet twice daily, May take an additional 1/2 tablet at bedtime as needed [provider] Taking Active   traMADol (ULTRAM) 50 MG tablet [id number] Yes Take 50 mg by mouth every 8 (eight) hours as needed. [provider] Taking Active   traZODone (DESYREL) 50 MG tablet [id number] Yes Take 50 mg by mouth at bedtime as needed. [provider] Taking Active   TRUE METRIX BLOOD GLUCOSE TEST test strip [id number]  Check blood sugar 2 times a day Olin Hauser, DO  Active   zinc gluconate 50 MG tablet [id number] Yes Take 50 mg by mouth daily. [provider] Taking Active             Comprehensive medication review performed; medication list updated in electronic medical record   Assessment/Plan:   Will send referral to Care Guide team as patient interested in any resources that may be available to help with installing/cost of safety railing in shower/bathroom  Diabetes: - Currently controlled - Recommend to continue to monitor home blood sugar and to contact office if needed for readings outside of established parameters or new symptoms   Hypertension: - Patient to continue home BP monitoring, keep log of results, have record to review at medical appointments and call providers for readings outside of established parameters, dizziness or new symptoms     Medication Management/Possible Side Effect Question: - Patient to continue to use hydroxyzine 25 mg only as prescribed - only 1 tablet at a time as needed for anxiety (up to twice daily), rather than for sleep due to lack of benefit to sleep particularly with chronic use, contribution  to dry mouth side effect and potential to cause additional side effects  Remind patient to lay down after taking hydroxyzine - Have counseled on importance of taking trazodone 50 mg QHS dose right before bedtime and then laying down after taking to reduce risk of falling - Patient to continue to follow up with psychiatrist, including regarding insomnia     Follow Up Plan: Clinical Pharmacist to follow up with patient by telephone again on 01/15/2023 at 1 pm   Wallace Cullens, PharmD, [place], [facility] [phone]

## 2022-10-17 ENCOUNTER — Telehealth: Payer: Self-pay | Admitting: *Deleted

## 2022-10-17 NOTE — Progress Notes (Signed)
  Care Coordination  Outreach Note  10/17/2022 Name: Dale [name]. MRN: [id number] DOB: Oct 03, 1961   Care Coordination Outreach Attempts: An unsuccessful telephone outreach was attempted today to offer the patient information about available care coordination services as a benefit of their health plan.   Referral received   Follow Up Plan:  Additional outreach attempts will be made to offer the patient care coordination information and services.   Encounter Outcome:  No Answer  [name], [place] Direct Dial: [phone]

## 2022-10-23 NOTE — Progress Notes (Signed)
  Care Coordination   Note   10/23/2022 Name: Dale [name]. MRN: [id number] DOB: 1961-07-30  [name] is a 60 y.o. year old male who sees Olin Hauser, DO for primary care. I reached out to [name] by phone today to offer care coordination services.  Mr. Ala was given information about Care Coordination services today including:   The Care Coordination services include support from the care team which includes your Nurse Coordinator, Clinical Social Worker, or Pharmacist.  The Care Coordination team is here to help remove barriers to the health concerns and goals most important to you. Care Coordination services are voluntary, and the patient may decline or stop services at any time by request to their care team member.   Care Coordination Consent Status: Patient agreed to services and verbal consent obtained.   Follow up plan:  Telephone appointment with care coordination team member scheduled for:  11/08/2022  Encounter Outcome:  Pt. Scheduled from referral  [name], [place] Direct Dial: [phone]

## 2022-10-24 DIAGNOSIS — Z9889 Other specified postprocedural states: Secondary | ICD-10-CM | POA: Diagnosis not present

## 2022-10-24 DIAGNOSIS — E119 Type 2 diabetes mellitus without complications: Secondary | ICD-10-CM | POA: Diagnosis not present

## 2022-11-08 ENCOUNTER — Ambulatory Visit: Payer: Self-pay

## 2022-11-08 NOTE — Patient Instructions (Signed)
Visit Information  Thank you for taking time to visit with me today. Please don't hesitate to contact me if I can be of assistance to you.   Following are the goals we discussed today:   Goals Addressed             This Visit's Progress    Care Coordination Activities       Care Coordination Interventions: Discussed the patient is interested in having grab bars installed in his bathroom and a shower chair Patient reports he was supposed to receive a shower chair after recent November inpatient stay from the hospital but it was never delivered Advised the patient SW would collaborate with patients primary care provider requesting an order for 3 in 1 be sent to DME Reviewed patient owns his home and is over the income limit for a referral to [employer] provided on [place] program - patient agreeable to referral Referral placed via [id number] requesting assistance with Primary school teacher with Dr. Parks Ranger via in basket message requesting orders for a 3 in 1 should he deem appropriate for the patient SW will follow up with the patient over the next month to confirm linked with resource         Our next appointment is by telephone on 2/2  at 11:00  Please call the care guide team at [phone] if you need to cancel or reschedule your appointment.   If you are experiencing a Mental Health or [place] or need someone to talk to, please go to [facility] [address], [place] [phone]) call [phone]  Patient verbalizes understanding of instructions and care plan provided today and agrees to view in [place]. Active MyChart status and patient understanding of how to access instructions and care plan via MyChart confirmed with patient.     Telephone follow up appointment with care management team member scheduled for:12/06/22  Daneen Schick, Arita Miss, CDP Social  Worker, Certified Dementia Practitioner [place] [facility]  Care Coordination [phone]

## 2022-11-08 NOTE — Patient Outreach (Addendum)
  Care Coordination   Follow Up Visit Note   11/08/2022 Name: Dale [name]. MRN: [id number] DOB: Mar 30, 1961  Dale Bunting. is a 62 y.o. year old male who sees Olin Hauser, DO for primary care. I spoke with  Dale Bunting. by phone today.  What matters to the patients health and wellness today?  Resource linking    Goals Addressed             This Visit's Progress    Care Coordination Activities       Care Coordination Interventions: Discussed the patient is interested in having grab bars installed in his bathroom and a shower chair Patient reports he was supposed to receive a shower chair after recent November inpatient stay from the hospital but it was never delivered Advised the patient SW would collaborate with patients primary care provider requesting an order for 3 in 1 be sent to DME Reviewed patient owns his home and is over the income limit for a referral to [employer] provided on [place] program - patient agreeable to referral Referral placed via [id number] requesting assistance with Primary school teacher with Dr. Parks Ranger via in basket message requesting orders for a 3 in 1 should he deem appropriate for the patient SW will follow up with the patient over the next month to confirm linked with resource         SDOH assessments and interventions completed:  No     Care Coordination Interventions:  Yes, provided   Follow up plan: Follow up call scheduled for 12/06/22    Encounter Outcome:  Pt. Visit Completed   Daneen Schick, Arita Miss, CDP Social Worker, Certified Dementia Practitioner [place] [facility]  Care Coordination [phone]

## 2022-11-11 ENCOUNTER — Ambulatory Visit: Payer: Self-pay

## 2022-11-11 NOTE — Patient Outreach (Signed)
  Care Coordination   Collaboration  Visit Note   11/11/2022 Name: Dale [name]. MRN: [id number] DOB: July 11, 1961  Dale [name]. is a 62 y.o. year old male who sees Olin Hauser, DO for primary care. I  collaborated with patients primary care provider and community resources to assist with bathroom modification needs.     Goals Addressed             This Visit's Progress    Care Coordination Activities   On track    Care Coordination Interventions: Determined previous Vocational Rehab referral was sent to [place] referral via [place] to [place] for bathroom modifications Collaboration with patients primary care provider who indicates he will place order for 3 in 1         SDOH assessments and interventions completed:  No     Care Coordination Interventions:  Yes, provided   Follow up plan:  SW will follow up with the patient over the next month as previously planned.    Encounter Outcome:  Pt. Visit Completed   Daneen Schick, BSW, CDP Social Worker, Certified Dementia Practitioner [place] [facility]  Care Coordination [phone]

## 2022-11-13 DIAGNOSIS — E119 Type 2 diabetes mellitus without complications: Secondary | ICD-10-CM | POA: Diagnosis not present

## 2022-11-14 ENCOUNTER — Other Ambulatory Visit: Payer: Self-pay | Admitting: Family Medicine

## 2022-11-14 DIAGNOSIS — E1140 Type 2 diabetes mellitus with diabetic neuropathy, unspecified: Secondary | ICD-10-CM

## 2022-11-14 NOTE — Telephone Encounter (Signed)
Requested Prescriptions  Pending Prescriptions Disp Refills   metFORMIN (GLUCOPHAGE) 1000 MG tablet [Pharmacy Med Name: METFORMIN HCL 1,000 MG TABLET] 180 tablet 0    Sig: TAKE 1 TABLET (1,000 MG TOTAL) BY MOUTH TWICE A DAY WITH FOOD     Endocrinology:  Diabetes - Biguanides Passed - 11/14/2022  1:32 AM      Passed - Cr in normal range and within 360 days    Creat  Date Value Ref Range Status  08/07/2022 0.89 0.70 - 1.35 mg/dL Final         Passed - HBA1C is between 0 and 7.9 and within 180 days    Hgb A1c MFr Bld  Date Value Ref Range Status  08/07/2022 6.0 (H) <5.7 % of total Hgb Final    Comment:    For someone without known diabetes, a hemoglobin  A1c value between 5.7% and 6.4% is consistent with prediabetes and should be confirmed with a  follow-up test. . For someone with known diabetes, a value <7% indicates that their diabetes is well controlled. A1c targets should be individualized based on duration of diabetes, age, comorbid conditions, and other considerations. . This assay result is consistent with an increased risk of diabetes. . Currently, no consensus exists regarding use of hemoglobin A1c for diagnosis of diabetes for children. .          Passed - eGFR in normal range and within 360 days    GFR, Est African American  Date Value Ref Range Status  11/10/2020 111 > OR = 60 mL/min/1.38m Final   GFR, Est Non African American  Date Value Ref Range Status  11/10/2020 96 > OR = 60 mL/min/1.747mFinal   eGFR  Date Value Ref Range Status  08/07/2022 97 > OR = 60 mL/min/1.7385minal         Passed - B12 Level in normal range and within 720 days    Vitamin B-12  Date Value Ref Range Status  08/07/2022 402 200 - 1,100 pg/mL Final         Passed - Valid encounter within last 6 months    Recent Outpatient Visits           4 weeks ago Other long term (current) drug therapy   [facility]   1 month ago  Postoperative wound infection   [facility]   2 months ago Essential (primary) hypertension   [facility]   2 months ago Essential (primary) hypertension   [facility]   3 months ago Annual physical exam   [facility]       Future Appointments             In 3 months [facility] Stoioff, [facility]onda FairlyD [facility] [place]            Passed - CBC within normal limits and completed in the last 12 months    WBC  Date Value Ref Range Status  08/07/2022 7.0 3.8 - 10.8 Thousand/uL Final   RBC  Date Value Ref Range Status  08/07/2022 5.26 4.20 - 5.80 Million/uL Final   Hemoglobin  Date Value Ref Range Status  08/07/2022 14.8 13.2 - 17.1 g/dL Final  03/07/2022 16.7 13.0 - 17.7 g/dL Final  Hematocrit  Date Value Ref Range Status  09/02/2022 44.3 37.5 - 51.0 % Final   MCHC  Date Value Ref Range Status  08/07/2022 32.4 32.0 - 36.0 g/dL Final   [facility]  Date Value Ref Range Status  08/07/2022 28.1 27.0 - 33.0 pg Final   MCV  Date Value Ref Range Status  08/07/2022 86.9 80.0 - 100.0 fL Final  06/26/2015 87 79 - 97 fL Final  08/20/2013 89 80 - 100 fL Final   No results found for: "PLTCOUNTKUC", "LABPLAT", "POCPLA" RDW  Date Value Ref Range Status  08/07/2022 14.5 11.0 - 15.0 % Final  06/26/2015 14.4 12.3 - 15.4 % Final  08/20/2013 14.2 11.5 - 14.5 % Final

## 2022-11-19 DIAGNOSIS — M170 Bilateral primary osteoarthritis of knee: Secondary | ICD-10-CM | POA: Diagnosis not present

## 2022-11-19 DIAGNOSIS — R2689 Other abnormalities of gait and mobility: Secondary | ICD-10-CM | POA: Diagnosis not present

## 2022-11-19 DIAGNOSIS — M9981 Other biomechanical lesions of cervical region: Secondary | ICD-10-CM | POA: Diagnosis not present

## 2022-11-19 DIAGNOSIS — M50222 Other cervical disc displacement at C5-C6 level: Secondary | ICD-10-CM | POA: Diagnosis not present

## 2022-11-19 DIAGNOSIS — M47892 Other spondylosis, cervical region: Secondary | ICD-10-CM | POA: Diagnosis not present

## 2022-11-19 DIAGNOSIS — M542 Cervicalgia: Secondary | ICD-10-CM | POA: Diagnosis not present

## 2022-11-19 DIAGNOSIS — M961 Postlaminectomy syndrome, not elsewhere classified: Secondary | ICD-10-CM | POA: Diagnosis not present

## 2022-11-19 DIAGNOSIS — M5021 Other cervical disc displacement,  high cervical region: Secondary | ICD-10-CM | POA: Diagnosis not present

## 2022-11-19 DIAGNOSIS — F1190 Opioid use, unspecified, uncomplicated: Secondary | ICD-10-CM | POA: Diagnosis not present

## 2022-11-19 DIAGNOSIS — R413 Other amnesia: Secondary | ICD-10-CM | POA: Diagnosis not present

## 2022-11-22 DIAGNOSIS — R251 Tremor, unspecified: Secondary | ICD-10-CM | POA: Diagnosis not present

## 2022-11-26 ENCOUNTER — Ambulatory Visit: Payer: Self-pay

## 2022-11-26 ENCOUNTER — Telehealth: Payer: Self-pay | Admitting: *Deleted

## 2022-11-26 NOTE — Progress Notes (Signed)
  Care Coordination Note  11/26/2022 Name: Dale [name]. MRN: [id number] DOB: September 17, 1961  Dale [name]. is a 62 y.o. year old male who is a primary care patient of Olin Hauser, DO and is actively engaged with the care management team. I reached out to [name] by phone today to assist with re-scheduling a follow up visit with the BSW  Follow up plan: Patient declines further follow up and engagement by the care management team. Appropriate care team members and provider have been notified via electronic communication. Pt says with combined income of him and his wife will be ineligible for resources  [name], [place] Direct Dial: [phone]

## 2022-11-26 NOTE — Patient Outreach (Signed)
  Care Coordination   Discipline Closure  Visit Note   11/26/2022 Name: Dale [name]. MRN: [id number] DOB: 08-17-1961  Dale [name]. is a 62 y.o. year old male who sees Olin Hauser, DO for primary care. I  performed a discipline closure at the request of the patient.  What matters to the patients health and wellness today?  SW did not speak to patient, patient requested to cancel future appointments due to being over the income limit for programs previously referred to.    Goals Addressed             This Visit's Progress    COMPLETED: Care Coordination Activities       Care Coordination Interventions: SW advised patient canceled future appointment due to being over the income limit for assistance. Patient declines further SW follow SW performed discipline closure per patients request         SDOH assessments and interventions completed:  No     Care Coordination Interventions:  No, not indicated   Follow up plan: No further intervention required.   Encounter Outcome:  Pt. Visit Completed   Daneen Schick, BSW, CDP Social Worker, Certified Dementia Practitioner [place] [facility]  Care Coordination [phone]

## 2022-11-27 DIAGNOSIS — G8929 Other chronic pain: Secondary | ICD-10-CM | POA: Diagnosis not present

## 2022-11-27 DIAGNOSIS — M170 Bilateral primary osteoarthritis of knee: Secondary | ICD-10-CM | POA: Diagnosis not present

## 2022-11-27 DIAGNOSIS — M25562 Pain in left knee: Secondary | ICD-10-CM | POA: Diagnosis not present

## 2022-11-27 DIAGNOSIS — M25561 Pain in right knee: Secondary | ICD-10-CM | POA: Diagnosis not present

## 2022-11-28 DIAGNOSIS — M2021 Hallux rigidus, right foot: Secondary | ICD-10-CM | POA: Diagnosis not present

## 2022-11-28 DIAGNOSIS — M9689 Other intraoperative and postprocedural complications and disorders of the musculoskeletal system: Secondary | ICD-10-CM | POA: Diagnosis not present

## 2022-11-28 DIAGNOSIS — M2041 Other hammer toe(s) (acquired), right foot: Secondary | ICD-10-CM | POA: Diagnosis not present

## 2022-11-28 DIAGNOSIS — E559 Vitamin D deficiency, unspecified: Secondary | ICD-10-CM | POA: Diagnosis not present

## 2022-11-28 DIAGNOSIS — G629 Polyneuropathy, unspecified: Secondary | ICD-10-CM | POA: Diagnosis not present

## 2022-11-28 DIAGNOSIS — E1169 Type 2 diabetes mellitus with other specified complication: Secondary | ICD-10-CM | POA: Diagnosis not present

## 2022-11-29 ENCOUNTER — Ambulatory Visit: Payer: Self-pay

## 2022-11-29 ENCOUNTER — Telehealth: Payer: Self-pay

## 2022-11-29 DIAGNOSIS — E1140 Type 2 diabetes mellitus with diabetic neuropathy, unspecified: Secondary | ICD-10-CM

## 2022-11-29 MED ORDER — OZEMPIC (1 MG/DOSE) 4 MG/3ML ~~LOC~~ SOPN: 1.0000 mg | PEN_INJECTOR | SUBCUTANEOUS | 1 refills | Status: DC

## 2022-11-29 NOTE — Telephone Encounter (Signed)
Chief Complaint: leg pain Symptoms: SOB x 3-4 days w/rest and activity, swelling to feet, pain from feet to lower legs 9/10 Frequency: Onset a few days Pertinent Negatives: Patient denies chest pain, other symptoms Disposition: '[x]'$ ED /'[]'$ Urgent Care (no appt availability in office) / '[]'$ Appointment(In office/virtual)/ '[]'$  [facility]/ '[]'$ Home Care/ '[]'$ Refused Recommended Disposition /'[]'$ [facility]/ '[]'$  Follow-up with PCP Additional Notes: Advised ED due to new onset SOB, patient verbalized understanding.   Reason for Disposition  Difficulty breathing  Answer Assessment - Initial Assessment Questions 1. ONSET: "When did the pain start?"      Last night 2. LOCATION: "Where is the pain located?"      Both lower legs and feet 3. PAIN: "How bad is the pain?"    (Scale 1-10; or mild, moderate, severe)   -  MILD (1-3): doesn't interfere with normal activities    -  MODERATE (4-7): interferes with normal activities (e.g., work or school) or awakens from sleep, limping    -  SEVERE (8-10): excruciating pain, unable to do any normal activities, unable to walk     8-9 4. WORK OR EXERCISE: "Has there been any recent work or exercise that involved this part of the body?"      No 5. CAUSE: "What do you think is causing the leg pain?"     I don't know 6. OTHER SYMPTOMS: "Do you have any other symptoms?" (e.g., chest pain, back pain, breathing difficulty, swelling, rash, fever, numbness, weakness)     Breathing difficulty with walking and rest past 3-4 days, swelling to feet  Protocols used: Leg Pain-A-AH

## 2022-11-29 NOTE — Telephone Encounter (Signed)
Patient would like a 90 day supply of [facility]empic [facility] [place].  His dosage: Ozempic 1 ml per week 4 x month (90 day supply)   [id number] [address] [place]: [phone] Fax: [phone]

## 2022-11-29 NOTE — Addendum Note (Signed)
Addended by: Olin Hauser on: 11/29/2022 07:09 PM   Modules accepted: Orders

## 2022-12-03 DIAGNOSIS — F909 Attention-deficit hyperactivity disorder, unspecified type: Secondary | ICD-10-CM | POA: Diagnosis not present

## 2022-12-03 DIAGNOSIS — F419 Anxiety disorder, unspecified: Secondary | ICD-10-CM | POA: Diagnosis not present

## 2022-12-03 DIAGNOSIS — G4700 Insomnia, unspecified: Secondary | ICD-10-CM | POA: Diagnosis not present

## 2022-12-06 DIAGNOSIS — M9689 Other intraoperative and postprocedural complications and disorders of the musculoskeletal system: Secondary | ICD-10-CM | POA: Diagnosis not present

## 2022-12-06 DIAGNOSIS — G8918 Other acute postprocedural pain: Secondary | ICD-10-CM | POA: Diagnosis not present

## 2022-12-06 DIAGNOSIS — T84498D Other mechanical complication of other internal orthopedic devices, implants and grafts, subsequent encounter: Secondary | ICD-10-CM | POA: Diagnosis not present

## 2022-12-06 DIAGNOSIS — M2041 Other hammer toe(s) (acquired), right foot: Secondary | ICD-10-CM | POA: Diagnosis not present

## 2022-12-06 DIAGNOSIS — M25561 Pain in right knee: Secondary | ICD-10-CM | POA: Diagnosis not present

## 2022-12-06 DIAGNOSIS — E669 Obesity, unspecified: Secondary | ICD-10-CM | POA: Diagnosis not present

## 2022-12-06 DIAGNOSIS — I2510 Atherosclerotic heart disease of native coronary artery without angina pectoris: Secondary | ICD-10-CM | POA: Diagnosis not present

## 2022-12-06 DIAGNOSIS — T84498A Other mechanical complication of other internal orthopedic devices, implants and grafts, initial encounter: Secondary | ICD-10-CM | POA: Diagnosis not present

## 2022-12-06 DIAGNOSIS — F419 Anxiety disorder, unspecified: Secondary | ICD-10-CM | POA: Diagnosis not present

## 2022-12-06 DIAGNOSIS — M79671 Pain in right foot: Secondary | ICD-10-CM | POA: Diagnosis not present

## 2022-12-06 DIAGNOSIS — E1142 Type 2 diabetes mellitus with diabetic polyneuropathy: Secondary | ICD-10-CM | POA: Diagnosis not present

## 2022-12-11 DIAGNOSIS — F32A Depression, unspecified: Secondary | ICD-10-CM | POA: Diagnosis not present

## 2022-12-11 DIAGNOSIS — R413 Other amnesia: Secondary | ICD-10-CM | POA: Diagnosis not present

## 2022-12-11 DIAGNOSIS — G609 Hereditary and idiopathic neuropathy, unspecified: Secondary | ICD-10-CM | POA: Diagnosis not present

## 2022-12-11 DIAGNOSIS — R251 Tremor, unspecified: Secondary | ICD-10-CM | POA: Diagnosis not present

## 2022-12-11 DIAGNOSIS — R2689 Other abnormalities of gait and mobility: Secondary | ICD-10-CM | POA: Diagnosis not present

## 2022-12-16 ENCOUNTER — Other Ambulatory Visit: Payer: Self-pay | Admitting: Family Medicine

## 2022-12-16 DIAGNOSIS — H1820 Unspecified corneal edema: Secondary | ICD-10-CM | POA: Diagnosis not present

## 2022-12-16 DIAGNOSIS — M2021 Hallux rigidus, right foot: Secondary | ICD-10-CM | POA: Diagnosis not present

## 2022-12-16 DIAGNOSIS — E1140 Type 2 diabetes mellitus with diabetic neuropathy, unspecified: Secondary | ICD-10-CM

## 2022-12-16 NOTE — Telephone Encounter (Signed)
Pharmacy called and they stated that they sent a request for the pts refills for OZEMPIC, 1 MG/DOSE, 4 MG/3ML SOPN   Needs to be sent to pts new pharmacy [facility] / please advise

## 2022-12-17 MED ORDER — OZEMPIC (1 MG/DOSE) 4 MG/3ML ~~LOC~~ SOPN: 1.0000 mg | PEN_INJECTOR | SUBCUTANEOUS | 1 refills | Status: DC

## 2022-12-17 NOTE — Telephone Encounter (Signed)
Requested Prescriptions  Pending Prescriptions Disp Refills   OZEMPIC, 1 MG/DOSE, 4 MG/3ML SOPN 9 mL 1    Sig: Inject 1 mg into the skin once a week.     Endocrinology:  Diabetes - GLP-1 Receptor Agonists - semaglutide Failed - 12/16/2022 12:45 PM      Failed - HBA1C in normal range and within 180 days    Hgb A1c MFr Bld  Date Value Ref Range Status  08/07/2022 6.0 (H) <5.7 % of total Hgb Final    Comment:    For someone without known diabetes, a hemoglobin  A1c value between 5.7% and 6.4% is consistent with prediabetes and should be confirmed with a  follow-up test. . For someone with known diabetes, a value <7% indicates that their diabetes is well controlled. A1c targets should be individualized based on duration of diabetes, age, comorbid conditions, and other considerations. . This assay result is consistent with an increased risk of diabetes. . Currently, no consensus exists regarding use of hemoglobin A1c for diagnosis of diabetes for children. .          Passed - Cr in normal range and within 360 days    Creat  Date Value Ref Range Status  08/07/2022 0.89 0.70 - 1.35 mg/dL Final         Passed - Valid encounter within last 6 months    Recent Outpatient Visits           2 months ago Other long term (current) drug therapy   [place], RPH-CPP   3 months ago Postoperative wound infection   [place], DO   3 months ago Essential (primary) hypertension   [facility] [place], Grayland Ormond A, RPH-CPP   4 months ago Essential (primary) hypertension   [place], Virl Diamond, RPH-CPP   4 months ago Annual physical exam   [facility] Olin Hauser, DO       Future Appointments             In 1 month Parks Ranger, Devonne Doughty, [place], [place]   In 8 months Stoioff, Ronda Fairly, MD [place]

## 2022-12-27 DIAGNOSIS — M79671 Pain in right foot: Secondary | ICD-10-CM | POA: Diagnosis not present

## 2022-12-27 DIAGNOSIS — T84498D Other mechanical complication of other internal orthopedic devices, implants and grafts, subsequent encounter: Secondary | ICD-10-CM | POA: Diagnosis not present

## 2022-12-27 DIAGNOSIS — M170 Bilateral primary osteoarthritis of knee: Secondary | ICD-10-CM | POA: Diagnosis not present

## 2023-01-10 DIAGNOSIS — Z833 Family history of diabetes mellitus: Secondary | ICD-10-CM | POA: Diagnosis not present

## 2023-01-10 DIAGNOSIS — Z4789 Encounter for other orthopedic aftercare: Secondary | ICD-10-CM | POA: Diagnosis not present

## 2023-01-10 DIAGNOSIS — G8929 Other chronic pain: Secondary | ICD-10-CM | POA: Diagnosis not present

## 2023-01-10 DIAGNOSIS — M25571 Pain in right ankle and joints of right foot: Secondary | ICD-10-CM | POA: Diagnosis not present

## 2023-01-10 DIAGNOSIS — Z880 Allergy status to penicillin: Secondary | ICD-10-CM | POA: Diagnosis not present

## 2023-01-10 DIAGNOSIS — E1142 Type 2 diabetes mellitus with diabetic polyneuropathy: Secondary | ICD-10-CM | POA: Diagnosis not present

## 2023-01-10 DIAGNOSIS — L97319 Non-pressure chronic ulcer of right ankle with unspecified severity: Secondary | ICD-10-CM | POA: Diagnosis not present

## 2023-01-10 DIAGNOSIS — Z8249 Family history of ischemic heart disease and other diseases of the circulatory system: Secondary | ICD-10-CM | POA: Diagnosis not present

## 2023-01-10 DIAGNOSIS — Z7409 Other reduced mobility: Secondary | ICD-10-CM | POA: Diagnosis not present

## 2023-01-10 DIAGNOSIS — F32A Depression, unspecified: Secondary | ICD-10-CM | POA: Diagnosis not present

## 2023-01-10 DIAGNOSIS — I10 Essential (primary) hypertension: Secondary | ICD-10-CM | POA: Diagnosis not present

## 2023-01-10 DIAGNOSIS — S91001A Unspecified open wound, right ankle, initial encounter: Secondary | ICD-10-CM | POA: Diagnosis not present

## 2023-01-10 DIAGNOSIS — M79604 Pain in right leg: Secondary | ICD-10-CM | POA: Diagnosis not present

## 2023-01-10 DIAGNOSIS — M7989 Other specified soft tissue disorders: Secondary | ICD-10-CM | POA: Diagnosis not present

## 2023-01-10 DIAGNOSIS — Z7982 Long term (current) use of aspirin: Secondary | ICD-10-CM | POA: Diagnosis not present

## 2023-01-13 DIAGNOSIS — Z09 Encounter for follow-up examination after completed treatment for conditions other than malignant neoplasm: Secondary | ICD-10-CM | POA: Diagnosis not present

## 2023-01-13 DIAGNOSIS — M9689 Other intraoperative and postprocedural complications and disorders of the musculoskeletal system: Secondary | ICD-10-CM | POA: Diagnosis not present

## 2023-01-13 DIAGNOSIS — Z981 Arthrodesis status: Secondary | ICD-10-CM | POA: Diagnosis not present

## 2023-01-13 DIAGNOSIS — S81801A Unspecified open wound, right lower leg, initial encounter: Secondary | ICD-10-CM | POA: Diagnosis not present

## 2023-01-13 DIAGNOSIS — T84498D Other mechanical complication of other internal orthopedic devices, implants and grafts, subsequent encounter: Secondary | ICD-10-CM | POA: Diagnosis not present

## 2023-01-13 DIAGNOSIS — E1142 Type 2 diabetes mellitus with diabetic polyneuropathy: Secondary | ICD-10-CM | POA: Diagnosis not present

## 2023-01-15 ENCOUNTER — Telehealth: Payer: Medicare Other

## 2023-01-15 ENCOUNTER — Telehealth: Payer: Self-pay | Admitting: Pharmacist

## 2023-01-15 NOTE — Telephone Encounter (Signed)
   Outreach Note  01/15/2023 Name: Dale [name]. MRN: [id number] DOB: 1961-10-20  Referred by: Olin Hauser, DO Reason for referral : No chief complaint on file.   Was unable to reach patient via telephone today and have left HIPAA compliant voicemail asking patient to return my call.   Follow Up Plan: Will collaborate with Care Guide to outreach to schedule follow up with me  Wallace Cullens, PharmD, [place] [phone]

## 2023-01-25 ENCOUNTER — Other Ambulatory Visit: Payer: Self-pay | Admitting: Family Medicine

## 2023-01-25 DIAGNOSIS — E1140 Type 2 diabetes mellitus with diabetic neuropathy, unspecified: Secondary | ICD-10-CM

## 2023-01-27 NOTE — Telephone Encounter (Signed)
Requested Prescriptions  Pending Prescriptions Disp Refills   metFORMIN (GLUCOPHAGE) 1000 MG tablet [Pharmacy Med Name: METFORMIN HCL 1,000 MG TABLET] 180 tablet 0    Sig: TAKE 1 TABLET (1,000 MG TOTAL) BY MOUTH TWICE A DAY WITH FOOD     Endocrinology:  Diabetes - Biguanides Passed - 01/25/2023  2:10 PM      Passed - Cr in normal range and within 360 days    Creat  Date Value Ref Range Status  08/07/2022 0.89 0.70 - 1.35 mg/dL Final         Passed - HBA1C is between 0 and 7.9 and within 180 days    Hgb A1c MFr Bld  Date Value Ref Range Status  08/07/2022 6.0 (H) <5.7 % of total Hgb Final    Comment:    For someone without known diabetes, a hemoglobin  A1c value between 5.7% and 6.4% is consistent with prediabetes and should be confirmed with a  follow-up test. . For someone with known diabetes, a value <7% indicates that their diabetes is well controlled. A1c targets should be individualized based on duration of diabetes, age, comorbid conditions, and other considerations. . This assay result is consistent with an increased risk of diabetes. . Currently, no consensus exists regarding use of hemoglobin A1c for diagnosis of diabetes for children. .          Passed - eGFR in normal range and within 360 days    GFR, Est African American  Date Value Ref Range Status  11/10/2020 111 > OR = 60 mL/min/1.39m2 Final   GFR, Est Non African American  Date Value Ref Range Status  11/10/2020 96 > OR = 60 mL/min/1.17m2 Final   eGFR  Date Value Ref Range Status  08/07/2022 97 > OR = 60 mL/min/1.10m2 Final         Passed - B12 Level in normal range and within 720 days    Vitamin B-12  Date Value Ref Range Status  08/07/2022 402 200 - 1,100 pg/mL Final         Passed - Valid encounter within last 6 months    Recent Outpatient Visits           3 months ago Other long term (current) drug therapy   [place], Grayland Ormond A, RPH-CPP   4  months ago Postoperative wound infection   [place], Devonne Doughty, DO   5 months ago Essential (primary) hypertension   [place], Grayland Ormond A, RPH-CPP   5 months ago Essential (primary) hypertension   [place], Virl Diamond, RPH-CPP   5 months ago Annual physical exam   [facility] [place], Devonne Doughty, DO       Future Appointments             In 1 month Parks Ranger, Devonne Doughty, DO [facility], [place]   In 7 months Stoioff, Ronda Fairly, MD [facility] [place]            Passed - CBC within normal limits and completed in the last 12 months    WBC  Date Value Ref Range Status  08/07/2022 7.0 3.8 - 10.8 Thousand/uL Final   RBC  Date Value Ref Range Status  08/07/2022 5.26 4.20 - 5.80 Million/uL Final   Hemoglobin  Date Value Ref Range Status  08/07/2022 14.8 13.2 -  17.1 g/dL Final  03/07/2022 16.7 13.0 - 17.7 g/dL Final   Hematocrit  Date Value Ref Range Status  09/02/2022 44.3 37.5 - 51.0 % Final   MCHC  Date Value Ref Range Status  08/07/2022 32.4 32.0 - 36.0 g/dL Final   [facility]  Date Value Ref Range Status  08/07/2022 28.1 27.0 - 33.0 pg Final   MCV  Date Value Ref Range Status  08/07/2022 86.9 80.0 - 100.0 fL Final  06/26/2015 87 79 - 97 fL Final  08/20/2013 89 80 - 100 fL Final   No results found for: "PLTCOUNTKUC", "LABPLAT", "POCPLA" RDW  Date Value Ref Range Status  08/07/2022 14.5 11.0 - 15.0 % Final  06/26/2015 14.4 12.3 - 15.4 % Final  08/20/2013 14.2 11.5 - 14.5 % Final

## 2023-01-28 DIAGNOSIS — F419 Anxiety disorder, unspecified: Secondary | ICD-10-CM | POA: Diagnosis not present

## 2023-01-28 DIAGNOSIS — F339 Major depressive disorder, recurrent, unspecified: Secondary | ICD-10-CM | POA: Diagnosis not present

## 2023-01-29 ENCOUNTER — Telehealth: Payer: Self-pay

## 2023-01-29 NOTE — Progress Notes (Signed)
  Care Coordination Note  01/29/2023 Name: Dale [name]. MRN: [id number] DOB: 11-22-1960  Dale [name]. is a 62 y.o. year old male who is a primary care patient of Olin Hauser, DO and is actively engaged with the Chronic Care Management team. I reached out to Dale [name]. by phone today to assist with re-scheduling a follow up visit with the Pharmacist  Follow up plan: Unsuccessful telephone outreach attempt made. A HIPAA compliant phone message was left for the patient providing contact information and requesting a return call.  The care management team will reach out to the patient again over the next 7 days.  If patient returns call to provider office, please advise to call [facility]  at [place], [place], [place] [address] Direct Dial: [phone] [name]

## 2023-02-03 DIAGNOSIS — M79671 Pain in right foot: Secondary | ICD-10-CM | POA: Diagnosis not present

## 2023-02-05 ENCOUNTER — Encounter: Payer: Self-pay | Admitting: Family Medicine

## 2023-02-05 DIAGNOSIS — K219 Gastro-esophageal reflux disease without esophagitis: Secondary | ICD-10-CM

## 2023-02-05 DIAGNOSIS — R109 Unspecified abdominal pain: Secondary | ICD-10-CM

## 2023-02-06 MED ORDER — PANTOPRAZOLE SODIUM 40 MG PO TBEC: 40.0000 mg | DELAYED_RELEASE_TABLET | Freq: Two times a day (BID) | ORAL | 3 refills | Status: DC

## 2023-02-10 ENCOUNTER — Other Ambulatory Visit: Payer: Self-pay

## 2023-02-10 DIAGNOSIS — E1140 Type 2 diabetes mellitus with diabetic neuropathy, unspecified: Secondary | ICD-10-CM

## 2023-02-10 DIAGNOSIS — I10 Essential (primary) hypertension: Secondary | ICD-10-CM

## 2023-02-10 DIAGNOSIS — N401 Enlarged prostate with lower urinary tract symptoms: Secondary | ICD-10-CM

## 2023-02-10 MED ORDER — METFORMIN HCL 1000 MG PO TABS: ORAL_TABLET | ORAL | 3 refills | Status: DC

## 2023-02-10 MED ORDER — TAMSULOSIN HCL 0.4 MG PO CAPS: 0.8000 mg | ORAL_CAPSULE | Freq: Every day | ORAL | 3 refills | Status: DC

## 2023-02-10 MED ORDER — LISINOPRIL 5 MG PO TABS: 5.0000 mg | ORAL_TABLET | Freq: Every day | ORAL | 3 refills | Status: DC

## 2023-02-11 NOTE — Progress Notes (Signed)
  Care Coordination Note  02/11/2023 Name: [name]. MRN: [id number] DOB: [birth year]  Dale [name]. is a 62 y.o. year old male who is a primary care patient of Smitty Cords, DO and is actively engaged with the Chronic Care Management team. I reached out to Dale [name]. by phone today to assist with re-scheduling a follow up visit with the RN Case Manager  Follow up plan: Unsuccessful telephone outreach attempt made. A HIPAA compliant phone message was left for the patient providing contact information and requesting a return call.  The care management team will reach out to the patient again over the next 7 days.  If patient returns call to provider office, please advise to call CCM Care Guide Penne Lash  at [phone]  Penne Lash, RMA Care Guide [facility]  [place], [place] [address] Direct Dial: [phone] [email or web address]

## 2023-02-13 ENCOUNTER — Ambulatory Visit: Payer: Medicare Other | Admitting: Family Medicine

## 2023-02-18 MED ORDER — DICYCLOMINE HCL 20 MG PO TABS
20.0000 mg | ORAL_TABLET | Freq: Three times a day (TID) | ORAL | 0 refills | Status: DC
Start: 2023-02-18 — End: 2023-04-02

## 2023-02-18 NOTE — Addendum Note (Signed)
Addended by: Smitty Cords on: 02/18/2023 08:12 AM   Modules accepted: Orders

## 2023-02-20 DIAGNOSIS — M79672 Pain in left foot: Secondary | ICD-10-CM | POA: Diagnosis not present

## 2023-02-20 DIAGNOSIS — G629 Polyneuropathy, unspecified: Secondary | ICD-10-CM | POA: Diagnosis not present

## 2023-02-20 DIAGNOSIS — T84498D Other mechanical complication of other internal orthopedic devices, implants and grafts, subsequent encounter: Secondary | ICD-10-CM | POA: Diagnosis not present

## 2023-02-20 DIAGNOSIS — M79671 Pain in right foot: Secondary | ICD-10-CM | POA: Diagnosis not present

## 2023-02-25 NOTE — Progress Notes (Signed)
  Care Coordination Note  02/25/2023 Name: Isidoro Santillana. MRN: [id number] DOB: Jun 01, 1961  Blair Hailey. is a 62 y.o. year old male who is a primary care patient of Smitty Cords, DO and is actively engaged with the Chronic Care Management team. I reached out to Blair Hailey. by phone today to assist with re-scheduling a follow up visit with the Pharmacist  Follow up plan: Unable to make contact on outreach attempts x 3. PCP Smitty Cords, DO notified via routed documentation in medical record.   Penne Lash, RMA Care Guide [facility]  [place], [place] [address] Direct Dial: [phone] [email or web address]

## 2023-02-27 ENCOUNTER — Ambulatory Visit (INDEPENDENT_AMBULATORY_CARE_PROVIDER_SITE_OTHER): Payer: Medicare HMO | Admitting: Family Medicine

## 2023-02-27 ENCOUNTER — Other Ambulatory Visit: Payer: Self-pay | Admitting: Family Medicine

## 2023-02-27 VITALS — BP 138/88 | HR 67 | Ht 72.0 in | Wt 201.0 lb

## 2023-02-27 DIAGNOSIS — E1140 Type 2 diabetes mellitus with diabetic neuropathy, unspecified: Secondary | ICD-10-CM

## 2023-02-27 DIAGNOSIS — Z23 Encounter for immunization: Secondary | ICD-10-CM | POA: Diagnosis not present

## 2023-02-27 DIAGNOSIS — I10 Essential (primary) hypertension: Secondary | ICD-10-CM

## 2023-02-27 DIAGNOSIS — Z0000 Encounter for general adult medical examination without abnormal findings: Secondary | ICD-10-CM

## 2023-02-27 DIAGNOSIS — R140 Abdominal distension (gaseous): Secondary | ICD-10-CM | POA: Diagnosis not present

## 2023-02-27 DIAGNOSIS — N401 Enlarged prostate with lower urinary tract symptoms: Secondary | ICD-10-CM

## 2023-02-27 DIAGNOSIS — L309 Dermatitis, unspecified: Secondary | ICD-10-CM

## 2023-02-27 DIAGNOSIS — K30 Functional dyspepsia: Secondary | ICD-10-CM | POA: Diagnosis not present

## 2023-02-27 DIAGNOSIS — E1169 Type 2 diabetes mellitus with other specified complication: Secondary | ICD-10-CM

## 2023-02-27 DIAGNOSIS — K219 Gastro-esophageal reflux disease without esophagitis: Secondary | ICD-10-CM

## 2023-02-27 LAB — POCT GLYCOSYLATED HEMOGLOBIN (HGB A1C): Hemoglobin A1C: 5.3 % (ref 4.0–5.6)

## 2023-02-27 MED ORDER — DESOXIMETASONE 0.25 % EX CREA
1.0000 | TOPICAL_CREAM | Freq: Two times a day (BID) | CUTANEOUS | 3 refills | Status: AC | PRN
Start: 2023-02-27 — End: ?

## 2023-02-27 MED ORDER — SHINGRIX 50 MCG/0.5ML IM SUSR
INTRAMUSCULAR | 0 refills | Status: DC
Start: 2023-02-27 — End: 2024-09-01

## 2023-02-27 NOTE — Progress Notes (Signed)
Subjective:    Patient ID: Dale [name]., male    DOB: 30-Jan-1961, 62 y.o.   MRN: [id number]  Dale [name]. is a 62 y.o. male presenting on 02/27/2023 for Medical Management of Chronic Issues   HPI  CHRONIC HTN: BP lower on avg Followed by [facility] Dr Gwen Pounds previously. Current Meds - Lisinopril  daily, Propranolol  TID - OFF Amlodipine, HCTZ Reports good compliance, took meds today. Tolerating well, w/o complaints. Denies CP, dyspnea, HA, edema, dizziness / lightheadedness   Type 2 DM with peripheral neuropathy Hyperlipidemia T2DM Last A1c 6.0% (08/2022) Due for A1c On medications - Ozempic  weekly, Metformin  BID CBGs improved 90-120s Lab reviewed On Statin Admits some abdominal pain at times. Constipation. Increased Gas production No more stomach acid Wt down 180 lbs, but up to 200 lbs.    Tremors Propranolol  TID - improved tremors   Recurrent Major Depression Followed by Psychiatry [facility] Currently on medication management Alprazolam He is working with them on refills / authorization    [facility] Dr Brooke Dare Post-op repeat surgery with foot, has 2 metal plates now intact Wears boot PRN  GERD Indigestion Bloating Recent worsening symptoms, despite PPI therapy TWICE A DAY. He has had controlled actual stomach acid but has variety of other functional GI symptoms Question if related to GLP Ozempic New referral to [facility], re-eval stomach acid / EGD / testing  Contact update Gentry Fitz, he had high cost Ozempic $400       02/27/2023   10:02 AM 09/18/2022   11:08 AM 08/14/2022   10:59 AM  Depression screen PHQ 2/9  Decreased Interest Down, Depressed, Hopeless PHQ - 2 Score Altered sleeping 0 0 2  Tired, decreased energy Change in appetite 2 0 2  Feeling bad or failure about yourself  2 0 1  Trouble concentrating 1 0 0  Moving slowly or fidgety/restless 0 2 1  Suicidal thoughts 0 0  0  PHQ-9 Score Difficult doing work/chores  Somewhat difficult Very difficult    Social History   Tobacco Use   Smoking status: Former    Types: Cigarettes    Start date: 08/29/1979    Quit date: 11/04/2010    Years since quitting: 12.3   Smokeless tobacco: Former    Quit date: 11/19/2010   Tobacco comments:    1 to 1.5 packs per day  Vaping Use   Vaping Use: Never used  Substance Use Topics   Alcohol use: No    Alcohol/week: 0.0 standard drinks of alcohol   Drug use: No    Review of Systems Per HPI unless specifically indicated above     Objective:    BP 138/88   Pulse 67   Ht 6' (1.829 m)   Wt 201 lb (91.2 kg)   SpO2 98%   BMI 27.26 kg/m   Wt Readings from Last 3 Encounters:  02/27/23 201 lb (91.2 kg)  09/18/22 210 lb (95.3 kg)  09/02/22 213 lb (96.6 kg)    Physical Exam    Results for orders placed or performed in visit on 02/27/23  POCT glycosylated hemoglobin (Hb A1C)  Result Value Ref Range   Hemoglobin A1C 5.3 4.0 - 5.6 %   HbA1c POC (<> result, manual entry)     HbA1c, POC (prediabetic range)     HbA1c, POC (controlled diabetic  range)        Assessment & Plan:   Problem List Items Addressed This Visit     Eczema   Relevant Medications   desoximetasone (TOPICORT) 0.25 % cream   Essential (primary) hypertension    HTN controlled currently Off HCTZ Amlodipine Continue current therapy with Propranolol TID, Lisinopril  Discuss goal to adjust medication based on weight loss to lower amt if indicated      Gastroesophageal reflux disease without esophagitis    Mostly controlled GERD however he seems to have associated other function GI symptoms related to indigestion constipation Suspect some GI side effect from GLP on Ozempic Last Endoscopy 2020 Will refer back to [facility] for further consultation on his GI symptoms Continue PP 40 BID      Relevant Orders   Ambulatory referral to Gastroenterology   Type 2 diabetes,  controlled, with neuropathy - Primary    Significantly improved DM control now A1c 5.3 (repeat test due to initial false low A1c) Improved w/ wt loss, metformin Resolved hyperglycemia, more stabilized sugar now Complications - peripheral neuropathy (also secondary to multiple back surgeries, with nerve damage), Hyperlipidemia, GERD  Plan:  1. On Ozempic  weekly + Metformin - Discussed some potential GI side effects on Ozempic, Consider switch to [facility] in future if eligible / covered, will re try to link back up with our clinical pharmacy  2. Encourage improved lifestyle - low carb, low sugar diet, reduce portion size, continue improving regular exercise 3. Check CBG, bring log to next visit for review 4. Continue ACEi, Statin      Relevant Orders   POCT glycosylated hemoglobin (Hb A1C) (Completed)   Other Visit Diagnoses     Need for shingles vaccine       Relevant Medications   SHINGRIX injection   Indigestion       Relevant Orders   Ambulatory referral to Gastroenterology   Abdominal bloating       Relevant Orders   Ambulatory referral to Gastroenterology        We will coordinate with Gentry Fitz on the Ozempic cost vs Mounjaro switch.  Referral to GI for stomach acid / gas concerns. I believe some symptoms are Ozempic side effects.  Last endoscopy 2020  [facility] [address] [place], [place] [address] Hours: 8AM-5PM Phone: [phone]  Orders Placed This Encounter  Procedures   Ambulatory referral to Gastroenterology    Referral Priority:   Routine    Referral Type:   Consultation    Referral Reason:   Specialty Services Required    Number of Visits Requested:   1   POCT glycosylated hemoglobin (Hb A1C)     Meds ordered this encounter  Medications   SHINGRIX injection    Sig: Inject 0.5 mL into muscle for shingles vaccine. 2nd dose    Dispense:  0.5 mL    Refill:  0   desoximetasone (TOPICORT) 0.25 % cream    Sig: Apply 1  Application topically 2 (two) times daily as needed.    Dispense:  60 g    Refill:  3    90 day     Follow up plan: Return in about 6 months (around 08/29/2023) for 6 month fasting lab only then 1 week later Annual Physical.  Future labs ordered for 08/2023 labs + Urine microalbumin   [name], DO [facility] 02/27/2023, 10:01 AM

## 2023-02-27 NOTE — Assessment & Plan Note (Signed)
Significantly improved DM control now A1c 5.3 (repeat test due to initial false low A1c) Improved w/ wt loss, metformin Resolved hyperglycemia, more stabilized sugar now Complications - peripheral neuropathy (also secondary to multiple back surgeries, with nerve damage), Hyperlipidemia, GERD  Plan:  1. On Ozempic  weekly + Metformin - Discussed some potential GI side effects on Ozempic, Consider switch to [facility] in future if eligible / covered, will re try to link back up with our clinical pharmacy  2. Encourage improved lifestyle - low carb, low sugar diet, reduce portion size, continue improving regular exercise 3. Check CBG, bring log to next visit for review 4. Continue ACEi, Statin

## 2023-02-27 NOTE — Patient Instructions (Addendum)
Thank you for coming to the office today.  Face eczema cream ordered to [facility].  We will coordinate with Gentry Fitz on the Ozempic cost vs Mounjaro switch.  Referral to GI for stomach acid / gas concerns. I believe some symptoms are Ozempic side effects.  Last endoscopy 2020  [facility] [address] [place], [place] [address] Hours: 8AM-5PM Phone: [phone]  DUE for FASTING BLOOD WORK (no food or drink after midnight before the lab appointment, only water or coffee without cream/sugar on the morning of)  SCHEDULE "Lab Only" visit in the morning at the clinic for lab draw in 6 MONTHS   - Make sure Lab Only appointment is at about 1 week before your next appointment, so that results will be available  For Lab Results, once available within 2-3 days of blood draw, you can can log in to MyChart online to view your results and a brief explanation. Also, we can discuss results at next follow-up visit.    Please schedule a Follow-up Appointment to: Return in about 6 months (around 08/29/2023) for 6 month fasting lab only then 1 week later Annual Physical.  If you have any other questions or concerns, please feel free to call the office or send a message through MyChart. You may also schedule an earlier appointment if necessary.  Additionally, you may be receiving a survey about your experience at our office within a few days to 1 week by e-mail or mail. We value your feedback.  Dale Pilar, DO [facility], [place]

## 2023-02-27 NOTE — Assessment & Plan Note (Addendum)
HTN controlled currently Off HCTZ Amlodipine Continue current therapy with Propranolol TID, Lisinopril  Discuss goal to adjust medication based on weight loss to lower amt if indicated

## 2023-02-27 NOTE — Assessment & Plan Note (Signed)
Mostly controlled GERD however he seems to have associated other function GI symptoms related to indigestion constipation Suspect some GI side effect from GLP on Ozempic Last Endoscopy 2020 Will refer back to [facility] for further consultation on his GI symptoms Continue PP 40 BID

## 2023-03-03 ENCOUNTER — Telehealth: Payer: Self-pay | Admitting: Pharmacist

## 2023-03-03 ENCOUNTER — Other Ambulatory Visit: Payer: Medicare HMO

## 2023-03-03 DIAGNOSIS — E291 Testicular hypofunction: Secondary | ICD-10-CM

## 2023-03-03 NOTE — Telephone Encounter (Signed)
   Outreach Note  03/03/2023 Name: Dale [name]. MRN: [id number] DOB: [birth year]  Referred by: Smitty Cords, DO Reason for referral : No chief complaint on file.   Patient previously followed by Clinical Pharmacist as referred by their PCP for assistance in managing diabetes, hypertension, and complex medication management. However, eventually was unable to reach patient further despite multiple attempts by myself and Care Guide team.  Receive message from PCP on 02/27/2023 requesting further outreach to patient.  Was unable to reach patient via telephone today and have left HIPAA compliant voicemail asking patient to return my call.    Follow Up Plan: Will attempt to reach patient by telephone again within the next 30 days.  Estelle Grumbles, PharmD, [facility] Clinical Pharmacist [facility] [phone]

## 2023-03-04 LAB — TESTOSTERONE: Testosterone: 1036 ng/dL — ABNORMAL HIGH (ref 264–916)

## 2023-03-04 LAB — HEMATOCRIT: Hematocrit: 47.3 % (ref 37.5–51.0)

## 2023-03-05 ENCOUNTER — Encounter: Payer: Self-pay | Admitting: Urology

## 2023-03-10 DIAGNOSIS — M961 Postlaminectomy syndrome, not elsewhere classified: Secondary | ICD-10-CM | POA: Diagnosis not present

## 2023-03-10 DIAGNOSIS — Z0289 Encounter for other administrative examinations: Secondary | ICD-10-CM | POA: Diagnosis not present

## 2023-03-10 DIAGNOSIS — G894 Chronic pain syndrome: Secondary | ICD-10-CM | POA: Diagnosis not present

## 2023-03-11 DIAGNOSIS — F341 Dysthymic disorder: Secondary | ICD-10-CM | POA: Diagnosis not present

## 2023-03-11 DIAGNOSIS — F339 Major depressive disorder, recurrent, unspecified: Secondary | ICD-10-CM | POA: Diagnosis not present

## 2023-03-11 DIAGNOSIS — F419 Anxiety disorder, unspecified: Secondary | ICD-10-CM | POA: Diagnosis not present

## 2023-03-24 ENCOUNTER — Other Ambulatory Visit: Payer: Self-pay | Admitting: Family Medicine

## 2023-03-24 DIAGNOSIS — N138 Other obstructive and reflux uropathy: Secondary | ICD-10-CM

## 2023-03-25 NOTE — Telephone Encounter (Signed)
Rx was sent to [facility] 02/10/23 #180/3 RF.   Requested Prescriptions  Refused Prescriptions Disp Refills   tamsulosin (FLOMAX) 0.4 MG CAPS capsule [Pharmacy Med Name: TAMSULOSIN HCL 0.4 MG CAPSULE] 180 capsule 3    Sig: TAKE 2 CAPSULES BY MOUTH EVERY DAY     Urology: Alpha-Adrenergic Blocker Passed - 03/24/2023  2:24 AM      Passed - PSA in normal range and within 360 days    PSA  Date Value Ref Range Status  07/13/2019 0.5 < OR = 4.0 ng/mL Final    Comment:    The total PSA value from this assay system is  standardized against the WHO standard. The test  result will be approximately 20% lower when compared  to the equimolar-standardized total PSA (Beckman  Coulter). Comparison of serial PSA results should be  interpreted with this fact in mind. . This test was performed using the Siemens  chemiluminescent method. Values obtained from  different assay methods cannot be used interchangeably. PSA levels, regardless of value, should not be interpreted as absolute evidence of the presence or absence of disease.    Prostate Specific Ag, Serum  Date Value Ref Range Status  09/02/2022 0.4 0.0 - 4.0 ng/mL Final    Comment:    Roche ECLIA methodology. According to the American Urological Association, Serum PSA should decrease and remain at undetectable levels after radical prostatectomy. The AUA defines biochemical recurrence as an initial PSA value 0.2 ng/mL or greater followed by a subsequent confirmatory PSA value 0.2 ng/mL or greater. Values obtained with different assay methods or kits cannot be used interchangeably. Results cannot be interpreted as absolute evidence of the presence or absence of malignant disease.          Passed - Last BP in normal range    BP Readings from Last 1 Encounters:  02/27/23 138/88         Passed - Valid encounter within last 12 months    Recent Outpatient Visits           3 weeks ago Type 2 diabetes, controlled, with neuropathy [facility])    [place] [facility] [place], Netta Neat, DO   5 months ago Other long term (current) drug therapy   [facility] D[facility], Gentry Fitz A, RPH-CPP   6 months ago Postoperative wound infection   [place] [facility] Smitty Cords, DO   7 months ago Essential (primary) hypertension   [facility] Delles, Gentry Fitz A, RPH-CPP   7 months ago Essential (primary) hypertension   [place] [facility] Delles, Jackelyn Poling, RPH-CPP       Future Appointments             In 5 months Althea Charon, Netta Neat, DO [facility]   In 5 months Stoioff, Verna Czech, MD [facility]

## 2023-04-02 ENCOUNTER — Other Ambulatory Visit: Payer: Self-pay | Admitting: Pharmacist

## 2023-04-02 ENCOUNTER — Telehealth: Payer: Medicare HMO

## 2023-04-02 ENCOUNTER — Ambulatory Visit: Payer: Medicare HMO | Admitting: Pharmacist

## 2023-04-02 DIAGNOSIS — R109 Unspecified abdominal pain: Secondary | ICD-10-CM

## 2023-04-02 DIAGNOSIS — E1169 Type 2 diabetes mellitus with other specified complication: Secondary | ICD-10-CM

## 2023-04-02 MED ORDER — ATORVASTATIN CALCIUM 10 MG PO TABS
10.0000 mg | ORAL_TABLET | Freq: Every day | ORAL | 1 refills | Status: DC
Start: 2023-04-02 — End: 2023-08-27

## 2023-04-02 MED ORDER — DICYCLOMINE HCL 20 MG PO TABS: 20.0000 mg | ORAL_TABLET | Freq: Three times a day (TID) | ORAL | 3 refills | Status: DC

## 2023-04-02 NOTE — Patient Instructions (Signed)
Goals Addressed             This Visit's Progress    Pharmacy Goals       Our goal A1c is less than 7%. This corresponds with fasting sugars less than 130 and 2 hour after meal sugars less than 180. Please check your blood sugar and keep a log of the results  Please check your blood pressure, keep record of results and bring this record with you to medical appointments.  Our goal bad cholesterol, or LDL, is less than 70 . This is why it is important to continue taking your atorvastatin  Please watch the mail for an envelope from [employer] containing the patient assistance program application. Please complete this application and mail back to [facility] Pharmacy Technician Noreene Larsson [place] along with a copy of your Medicare Part D prescription card and a copy of your proof of income documents.  Feel free to call me with any questions or concerns. I look forward to our next call!   Estelle Grumbles, PharmD, [place], CPP Clinical Pharmacist [facility] [phone]

## 2023-04-02 NOTE — Telephone Encounter (Signed)
Patient requesting renewal of his dicyclomine Rx as current prescription is out of refills. Would you please consider sending renewal to [facility] for patient?  Thank you!  Dale [name]

## 2023-04-02 NOTE — Progress Notes (Signed)
04/02/2023 Name: Dale [name]. MRN: [id number] DOB: 06-Apr-1961  Chief Complaint  Patient presents with   Medication Management   Medication Adherence    Dale [name]. is a 62 y.o. year old male who presented for a telephone visit.   They were referred to the pharmacist by their PCP for assistance in managing diabetes, hypertension, hyperlipidemia, and medication access.   Subjective:  Care Team: Primary Care Provider: Smitty Cords, DO ; Next Scheduled Visit: 08/29/2023 Neurologist: Jolene Provost, MD Psychiatrist: Maralyn Sago, MD Pain Management: [facility]; Next Scheduled Visit: 06/04/2023 Orthopedics: [facility]; Next Scheduled Visit: 05/22/2023 Urology: Riki Altes, MD Gastroenterology: [facility]: Next Scheduled Visit: 05/26/2023 Cardiology: Olena Heckle, MD   Medication Access/Adherence  Current Pharmacy:  [facility] [phone] [facility] [place] - [address] [place] [address] Phone: [phone] Fax: [phone]  [facility] - [place], [place] - [address] [place] [address] Phone: [phone] Fax: [phone]   Patient reports affordability concerns with their medications: Yes  - Ozempic unaffordable to patient when he is in the coverage gap Patient reports access/transportation concerns to their pharmacy: No  Patient reports adherence concerns with their medications:  No     Based on review of dispensing history, note atorvastatin 10 mg Rx last filled 10/07/2022 for 90 day supply - Patient denies using weekly pillbox, rather has his own system and denies missed doses - Attributes gap in refills to having previously received extra refill from local pharmacy at same time as refilled through mail order - Note current prescription is expired  Hypertension:   Current medications:  - Lisinopril 5 mg daily - Propranolol IR 40mg  three  times daily   Medications previously tried: HCTZ (stopped due to hypotension)   Patient has an automated, upper arm home BP cuff. Reports last checked: - 5/27: 120/84, HR 82 - 5/23: 133/87, HR 88   Denies recent hypotension    Current physical activity: Limited by recent foot surgeries, but now able to walk again     Diabetes:   Current medications:  - metformin 1000 mg twice daily - Ozempic 1 mg weekly   Current glucose readings: reports last checked morning fasting blood sugar~5/25, recalls reading: 101   Reports doing well with having well-balanced meals and controlling carbohydrates/sweets portion sizes  Current physical activity: Limited by recent foot surgeries, but now able to walk again  Statin: atorvastatin 10 mg daily   Current medication access support: None - Note Ozempic becomes unaffordable when patient reaches annual coverage gap of prescription coverage - Reports income has changed since last calendar year. Based on reported income, patient meets criteria for patient assistance for Ozempic through [employer]   Objective:  Lab Results  Component Value Date   HGBA1C 5.3 02/27/2023    Lab Results  Component Value Date   CREATININE 0.89 08/07/2022   BUN 18 08/07/2022   NA 140 08/07/2022   K 5.1 08/07/2022   CL 100 08/07/2022   CO2 31 08/07/2022    Lab Results  Component Value Date   CHOL 123 08/07/2022   HDL 38 (L) 08/07/2022   LDLCALC 62 08/07/2022   TRIG 145 08/07/2022   CHOLHDL 3.2 08/07/2022   BP Readings from Last 3 Encounters:  02/27/23 138/88  09/18/22 134/70  09/02/22 128/74   Pulse Readings from Last 3 Encounters:  02/27/23 67  09/18/22 64  09/02/22  80     Medications Reviewed Today     Reviewed by Manuela Neptune, RPH-CPP (Pharmacist) on 04/02/23 at 1236  Med List Status: <None>   Medication Order Taking? Sig Documenting Provider Last Dose Status Informant  acetaminophen (TYLENOL) 500 MG tablet [id number] Yes Take 500  mg by mouth 2 (two) times daily as needed. [provider] Taking Active   ALPRAZolam Prudy Feeler) 0.5 MG tablet [id number] Yes Take 0.5 mg by mouth 2 (two) times daily as needed. [provider] Taking Active   amphetamine-dextroamphetamine (ADDERALL) 10 MG tablet [id number] Yes Take 1 tablet (10 mg total) by mouth in the morning. [provider] Taking Active   aspirin EC 81 MG tablet [id number] Yes Take 81 mg by mouth daily. [provider] Taking Active   atorvastatin (LIPITOR) 10 MG tablet [id number]  Take 1 tablet (10 mg total) by mouth daily. Smitty Cords, DO  Active   b complex vitamins capsule [id number] Yes Take by mouth. [provider] Taking Active   Blood Glucose Monitoring Suppl (TRUE METRIX AIR GLUCOSE METER) w/Device KIT [id number]  Check blood sugar 2 times daily Smitty Cords, DO  Active   buPROPion (WELLBUTRIN XL) 150 MG 24 hr tablet [id number] Yes Take 150 mg by mouth daily. [provider] Taking Active   Cholecalciferol 25 MCG (1000 UT) TBDP [id number] Yes Take 2 tablets by mouth daily. [provider] Taking Active   CINNAMON PO [id number] Yes Take by mouth. [provider] Taking Active   Coenzyme Q10 100 MG TABS [id number] Yes Take 100 mg by mouth daily. [provider] Taking Active   cyanocobalamin 1000 MCG tablet [id number] Yes Take 1 tablet by mouth daily. [provider] Taking Active   desoximetasone (TOPICORT) 0.25 % cream [id number] Yes Apply 1 Application topically 2 (two) times daily as needed. Smitty Cords, DO Taking Active   diclofenac sodium (VOLTAREN) 1 % GEL [id number] Yes Apply topically. [provider] Taking Active   dicyclomine (BENTYL) 20 MG tablet [id number] Yes Take 1 tablet (20 mg total) by mouth 3 (three) times daily before meals. As needed for abdominal cramping Smitty Cords, DO Taking Active   DULoxetine (CYMBALTA) 60 MG  capsule [id number] Yes Take 60 mg by mouth 2 (two) times daily. [provider] Taking Active   gabapentin (NEURONTIN) 300 MG capsule [id number] Yes Take 3 capsules by mouth 3 (three) times daily. [provider] Taking Active            Med Note De Nurse, Bladimir Auman A   Wed Nov 08, 2020  4:56 PM) Reports taking 900 mg in the morning and at noon, and take 1200 mg (4 capsules) nightly as directed by [facility]  Glucosamine-Chondroitin 250-200 MG TABS [id number] Yes Take by mouth. [provider] Taking Active   hydrOXYzine (ATARAX) 25 MG tablet [id number] Yes Take 25 mg by mouth 2 (two) times daily as needed. [provider] Taking Active   ibuprofen (ADVIL) 200 MG tablet [id number] Yes Take 600 mg by mouth 2 (two) times daily as needed. [provider] Taking Active   lisinopril (ZESTRIL) 5 MG tablet [id number] Yes Take 1 tablet (5 mg total) by mouth daily. Smitty Cords, DO Taking Active   metFORMIN (GLUCOPHAGE) 1000 MG tablet [id number] Yes TAKE 1 TABLET (1,000 MG TOTAL) BY MOUTH TWICE A DAY WITH FOOD Smitty Cords, DO Taking Active   Multiple Vitamin (MULTIVITAMIN) tablet [id number] Yes Take 1 tablet by  mouth daily. [provider] Taking Active   Omega-3 Fatty Acids (FISH OIL) 1000 MG CAPS [id number] Yes Take by mouth. [provider] Taking Active   OZEMPIC, 1 MG/DOSE, 4 MG/3ML SOPN [id number] Yes Inject 1 mg into the skin once a week. Smitty Cords, DO Taking Active   pantoprazole (PROTONIX) 40 MG tablet [id number] Yes Take 1 tablet (40 mg total) by mouth 2 (two) times daily before a meal. Althea Charon, Netta Neat, DO Taking Active   propranolol (INDERAL) 40 MG tablet [id number] Yes TAKE 1 TABLET BY MOUTH THREE TIMES A DAY Smitty Cords, DO Taking Active   [facility] injection [id number]  Inject 0.5 mL into muscle for shingles vaccine. 2nd dose Smitty Cords, DO  Active   tamsulosin  [facility]) 0.4 MG CAPS capsule [id number] Yes Take 2 capsules (0.8 mg total) by mouth daily. Smitty Cords, DO Taking Active   testosterone cypionate (DEPOTESTOSTERONE CYPIONATE) 200 MG/ML injection [id number] Yes INJECT 1 ML (200 MG TOTAL) INTO THE MUSCLE EVERY 14 DAYS Stoioff, Verna Czech, MD Taking Active   tiZANidine (ZANAFLEX) 2 MG tablet [id number] Yes Take 2 mg by mouth at bedtime as needed. [provider] Taking Active   tiZANidine (ZANAFLEX) 4 MG tablet [id number] Yes 1 tablet twice daily, May take an additional 1/2 tablet at bedtime as needed [provider] Taking Active   traMADol (ULTRAM) 50 MG tablet [id number] Yes Take 50 mg by mouth every 6 (six) hours as needed. [provider] Taking Active   traZODone (DESYREL) 50 MG tablet [id number]  Take 25 mg by mouth at bedtime as needed. [provider]  Active   TRUE METRIX BLOOD GLUCOSE TEST test strip [id number]  Check blood sugar 2 times a day Smitty Cords, DO  Active               Assessment/Plan:   Comprehensive medication review performed; medication list updated in electronic medical record  - Caution patient for risk of dizziness/sedation with alprazolam, gabapentin, hydroxyzine, tizanidine, tramadol and trazodone particularly when these medications taken in combination   Patient advises uses caution  Confirms using hydroxyzine only as prescribed as needed Reports taking only 1/2 tablet of trazodone (25 mg) nightly as needed and uses caution next day for dizziness/sedation - Patient requests renewal of dicyclomine Rx as current prescription out of refills. Send request to clinical team  Encourage patient to consider using weekly pillbox to aid with medication adherence, but patient declines Discuss importance of medication adherence, particularly related to atorvastatin for ASCVD prevention  Encourage patient to schedule a follow up appointment with Cardiology  As  requested, CPP will send renewal of atorvastatin prescription to [facility] for patient  Encourage patient to [facility] regarding scheduling to receive 2nd dose of Shingrix vaccination  Diabetes: - Currently controlled - Recommend to continue to monitor home blood sugar and to contact office if needed for readings outside of established parameters or new symptoms - Will collaborate with [facility] CPhT and PCP for aid to patient with applying for patient assistance for Ozempic from [employer]   Hypertension: - Patient to continue home BP monitoring, keep log of results, have record to review at medical appointments and call providers for readings outside of established parameters, dizziness or new symptoms     Follow Up Plan: Clinical Pharmacist to follow up with patient by telephone again on 04/28/2023 at 10 am   Estelle Grumbles, PharmD, BCACP, CPP Clinical Pharmacist  [employer] [phone]

## 2023-04-03 ENCOUNTER — Telehealth: Payer: Self-pay | Admitting: Pharmacy Technician

## 2023-04-03 DIAGNOSIS — Z5986 Financial insecurity: Secondary | ICD-10-CM

## 2023-04-03 NOTE — Progress Notes (Signed)
[facility] Customer service manager [facility])                                            [facility] Quality Pharmacy Team    04/03/2023  Dale [name] 1960-11-22 [id number]                                      Medication Assistance Referral  Referral From:  [facility] PharmD Estelle Grumbles  Medication/Company: Franki Monte / [employer] Patient application portion:  Mailed Provider application portion: Faxed  to Dr. [name] Provider address/fax verified via: Office website  Aziah Kaiser P. Nyle Limb, CPhT [facility] [employer]  [phone]

## 2023-04-28 ENCOUNTER — Telehealth: Payer: Self-pay | Admitting: Pharmacist

## 2023-04-28 ENCOUNTER — Telehealth: Payer: Medicare HMO

## 2023-04-28 NOTE — Telephone Encounter (Signed)
   Outreach Note  04/28/2023 Name: Dale [name]. MRN: [id number] DOB: 12-07-1960  Referred by: Smitty Cords, DO Reason for referral : No chief complaint on file.   Was unable to reach patient via telephone today and have left HIPAA compliant voicemail asking patient to return my call.    Follow Up Plan: Will attempt to reach patient by telephone again within the next 30 days   Estelle Grumbles, PharmD, [facility] Clinical Pharmacist [facility] [phone]

## 2023-05-02 ENCOUNTER — Telehealth: Payer: Self-pay | Admitting: Pharmacy Technician

## 2023-05-02 DIAGNOSIS — Z5986 Financial insecurity: Secondary | ICD-10-CM

## 2023-05-02 NOTE — Progress Notes (Signed)
[facility] Customer service manager [facility])                                            [facility] Quality Pharmacy Team    05/02/2023  Dale [name]. 1961-07-10 [id number]  Received both patient and provider portion(s) of patient assistance application(s) for Ozempic. Faxed completed application and required documents into [employer].    [name], CPhT [facility] Office: [phone] Fax: [phone] Email: [name]

## 2023-05-05 ENCOUNTER — Other Ambulatory Visit: Payer: Self-pay | Admitting: Urology

## 2023-05-07 ENCOUNTER — Other Ambulatory Visit: Payer: Self-pay | Admitting: *Deleted

## 2023-05-07 ENCOUNTER — Telehealth: Payer: Self-pay | Admitting: Pharmacy Technician

## 2023-05-07 DIAGNOSIS — Z5986 Financial insecurity: Secondary | ICD-10-CM

## 2023-05-07 NOTE — Progress Notes (Addendum)
[facility] Customer service manager [facility])                                            [facility] Quality Pharmacy Team    05/07/2023  Logic Hoock. Oct 25, 1961 [id number]  Care coordination call placed to [employer] in regard to Ozempic application.  Spoke to Brett Canales who informs patient is APPROVED 05/05/2023-11/04/23. The patient's id number for the program is [id number]. Initial and subsequent refills will process automatically with delivery to the prescriber's address. However, if patient feels current supply is not sufficient until next shipment arrives, patient may call [employer] at [phone].  [name], CPhT [place]  [facility] Office: [phone] Fax: [phone] Email: [email or web address]

## 2023-05-08 MED ORDER — TESTOSTERONE CYPIONATE 200 MG/ML IM SOLN: INTRAMUSCULAR | 3 refills | Status: DC

## 2023-05-10 ENCOUNTER — Other Ambulatory Visit: Payer: Self-pay | Admitting: Family Medicine

## 2023-05-10 DIAGNOSIS — E1140 Type 2 diabetes mellitus with diabetic neuropathy, unspecified: Secondary | ICD-10-CM

## 2023-05-12 ENCOUNTER — Ambulatory Visit
Admission: EM | Admit: 2023-05-12 | Discharge: 2023-05-12 | Disposition: A | Discharge status: Home / Self Care | Payer: Medicare HMO

## 2023-05-12 DIAGNOSIS — M5010 Cervical disc disorder with radiculopathy, unspecified cervical region: Secondary | ICD-10-CM

## 2023-05-12 MED ORDER — DEXAMETHASONE SODIUM PHOSPHATE 10 MG/ML IJ SOLN
10.0000 mg | Freq: Once | INTRAMUSCULAR | Status: AC
  Administered 2023-05-12: 10 mg via INTRAMUSCULAR

## 2023-05-12 NOTE — ED Provider Notes (Signed)
[facility]    CSN: [id number] Arrival date & time: 05/12/23  1504      History   Chief Complaint Chief Complaint  Patient presents with   Neck Pain   Shoulder Pain    HPI Dale Naden. is a 62 y.o. male.   HPI  62 year old male with a past medical history significant for type 2 diabetes, hypertension, CAD, MVP, hyperlipidemia, and cervical arthropathy presents for evaluation of pain in his right neck and shoulder that has been going on for last 6 days.  He denies any injury or heavy lifting.  He has neuropathy at baseline in both hands but has not noticed an increase.  He has been using topical diclofenac, tizanidine, tramadol, and gabapentin without any improvement of his symptoms.  He has received previous injections in his cervical spine.  Past Medical History:  Diagnosis Date   ADHD (attention deficit hyperactivity disorder)    Anxiety    Coronary artery disease    Coronary artery disease    Coronary atherosclerosis    Depression    GERD (gastroesophageal reflux disease)    Glaucoma    Headache    migrane   Heart disease    Hyperlipidemia    Hypertension    Hypertriglyceridemia    Hypogonadism in male    MVP (mitral valve prolapse)    Narcolepsy and cataplexy    Non-alcoholic fatty liver disease     Patient Active Problem List   Diagnosis Date Noted   Arthritis of both knees 06/14/2021   Knee pain, bilateral 06/14/2021   Sprain of left knee 06/14/2021   Persistent depressive disorder 06/13/2020   Anxiety 06/13/2020   Chronic abdominal pain 04/27/2020   Erectile dysfunction due to arterial insufficiency 06/29/2019   Hypogonadism in male 06/29/2019   Pes cavus 04/30/2018   Chronic pain syndrome 02/18/2018   Postoperative CSF leak 10/31/2017   Moderate episode of recurrent major depressive disorder (HCC) 07/21/2017   Obesity due to excess calories 08/07/2016   MCI (mild cognitive impairment) with memory loss 08/06/2016   Peripheral  neuropathy 08/06/2016   Anxiety 07/17/2016   Eczema 01/22/2016   Benign essential tremor 10/03/2015   Glaucoma 06/27/2015   B12 deficiency 06/27/2015   Cataplexy and narcolepsy 06/26/2015   Billowing mitral valve 06/26/2015   History of prolonged Q-T interval on ECG 06/26/2015   Herniation of nucleus pulposus 06/26/2015   Low testosterone 06/26/2015   Obesity (BMI 30.0-34.9) 06/26/2015   CAD (coronary artery disease), native coronary artery 06/26/2015   Fatty infiltration of liver 10/21/2014   Hepatic steatosis 10/21/2014   Acid reflux 07/22/2014   Gastroesophageal reflux disease without esophagitis 07/22/2014   Type 2 diabetes, controlled, with neuropathy (HCC) 06/22/2014   Hyperlipidemia associated with type 2 diabetes mellitus (HCC) 06/22/2014   Essential (primary) hypertension 06/22/2014   Failed back syndrome of lumbar spine 04/29/2014   Chronic pain associated with significant psychosocial dysfunction 04/29/2014   History of surgical procedure 04/29/2014   Polypharmacy 09/09/2013   Pain medication agreement signed 09/09/2013   Other long term (current) drug therapy 11/20/1998   Encounter for long-term (current) use of other medications 11/20/1998    Past Surgical History:  Procedure Laterality Date   COLONOSCOPY WITH PROPOFOL N/A 11/18/2018   Procedure: COLONOSCOPY WITH PROPOFOL;  Surgeon: Scot Jun, MD;  [facility];  Service: Endoscopy;  Laterality: N/A;   ESOPHAGOGASTRODUODENOSCOPY (EGD) WITH PROPOFOL N/A 11/18/2018   Procedure: ESOPHAGOGASTRODUODENOSCOPY (EGD) WITH PROPOFOL;  Surgeon: Mechele Collin,  Wilber Bihari, MD;  Location: [facility] ENDOSCOPY;  Service: Endoscopy;  Laterality: N/A;   pain pump implant and removal Bilateral    Dec. 21 2019   SPINE SURGERY     Multiple surgeries, initial 2001, thoracic disc repair and recurrent revisions.   THROAT SURGERY         Home Medications    Prior to Admission medications   Medication Sig Start Date End Date  Taking? Authorizing Provider  acetaminophen (TYLENOL) 500 MG tablet Take 500 mg by mouth 2 (two) times daily as needed.   Yes [provider]  ALPRAZolam Prudy Feeler) 0.5 MG tablet Take 0.5 mg by mouth 2 (two) times daily as needed.   Yes [provider]  amphetamine-dextroamphetamine (ADDERALL) 10 MG tablet Take 1 tablet (10 mg total) by mouth in the morning. 09/24/22  Yes [provider]  aspirin EC 81 MG tablet Take 81 mg by mouth daily.   Yes [provider]  atorvastatin (LIPITOR) 10 MG tablet Take 1 tablet (10 mg total) by mouth daily. 04/02/23  Yes Karamalegos, Netta Neat, DO  b complex vitamins capsule Take by mouth.   Yes [provider]  Blood Glucose Monitoring Suppl (TRUE METRIX AIR GLUCOSE METER) w/Device KIT Check blood sugar 2 times daily 04/27/20  Yes Karamalegos, Alexander J, DO  buPROPion (WELLBUTRIN XL) 150 MG 24 hr tablet Take 150 mg by mouth daily.   Yes [provider]  Cholecalciferol 25 MCG (1000 UT) TBDP Take 2 tablets by mouth daily.   Yes [provider]  CINNAMON PO Take by mouth.   Yes [provider]  Coenzyme Q10 100 MG TABS Take 100 mg by mouth daily.   Yes [provider]  cyanocobalamin 1000 MCG tablet Take 1 tablet by mouth daily.   Yes [provider]  desoximetasone (TOPICORT) 0.25 % cream Apply 1 Application topically 2 (two) times daily as needed. 02/27/23  Yes Karamalegos, Netta Neat, DO  diclofenac sodium (VOLTAREN) 1 % GEL Apply topically. 04/10/18  Yes [provider]  dicyclomine (BENTYL) 20 MG tablet Take 1 tablet (20 mg total) by mouth 3 (three) times daily before meals. As needed for abdominal cramping 04/02/23  Yes Karamalegos, Netta Neat, DO  DULoxetine (CYMBALTA) 60 MG capsule Take 60 mg by mouth 2 (two) times daily. 05/22/21  Yes [provider]  gabapentin (NEURONTIN) 300 MG capsule Take 3 capsules by mouth 3 (three) times daily.   Yes [provider]  Glucosamine-Chondroitin 250-200 MG TABS Take by mouth.   Yes [provider]  hydrOXYzine (ATARAX) 25 MG tablet Take 25 mg by mouth 2 (two) times daily as needed. 06/18/22  Yes [provider]  ibuprofen (ADVIL) 200 MG tablet Take 600 mg by mouth 2 (two) times daily as needed. 09/26/21  Yes [provider]  lidocaine (LIDODERM) 5 % SMARTSIG:Topical   Yes [provider]  lisinopril (ZESTRIL) 5 MG tablet Take 1 tablet (5 mg total) by mouth daily. 02/10/23  Yes Karamalegos, Netta Neat, DO  metFORMIN (GLUCOPHAGE) 1000 MG tablet TAKE 1 TABLET (1,000 MG TOTAL) BY MOUTH TWICE A DAY WITH FOOD 02/10/23  Yes Karamalegos, Netta Neat, DO  Multiple Vitamin (MULTIVITAMIN) tablet Take 1 tablet by mouth daily.   Yes [provider]  Omega-3 Fatty Acids (FISH OIL) 1000 MG CAPS Take by mouth.   Yes [provider]  OZEMPIC, 1 MG/DOSE, 4 MG/3ML SOPN Inject 1 mg into the skin once a week. 12/17/22  Yes  Karamalegos, Alexander J, DO  pantoprazole (PROTONIX) 40 MG tablet Take 1 tablet (40 mg total) by mouth 2 (two) times daily before a meal. 02/06/23  Yes Karamalegos, Netta Neat, DO  propranolol (INDERAL) 40 MG tablet TAKE 1 TABLET BY MOUTH THREE TIMES A DAY 02/11/21  Yes Karamalegos, Netta Neat, DO  SHINGRIX injection Inject 0.5 mL into muscle for shingles vaccine. 2nd dose 02/27/23  Yes Karamalegos, Netta Neat, DO  tamsulosin (FLOMAX) 0.4 MG CAPS capsule Take 2 capsules (0.8 mg total) by mouth daily. 02/10/23  Yes Karamalegos, Netta Neat, DO  testosterone cypionate (DEPOTESTOSTERONE CYPIONATE) 200 MG/ML injection INJECT 1 ML (200 MG TOTAL) INTO THE MUSCLE EVERY 14 DAYS 05/08/23  Yes Stoioff, Verna Czech, MD  tiZANidine (ZANAFLEX) 2 MG tablet Take 2 mg by mouth at bedtime as needed. 06/08/22  Yes [provider]  tiZANidine (ZANAFLEX) 4 MG tablet 1 tablet twice daily, May take an additional 1/2 tablet at bedtime as needed 10/08/18  Yes [provider]  traMADol (ULTRAM) 50 MG tablet Take 50 mg by mouth every 6 (six) hours as needed.   Yes [provider]  traZODone (DESYREL) 50 MG tablet Take 25 mg by mouth at bedtime as needed. 08/13/22  Yes [provider]  TRUE METRIX BLOOD GLUCOSE TEST test strip Check blood sugar 2 times a day 01/16/22  Yes Karamalegos, Netta Neat, DO    Family History Family History  Problem Relation Age of Onset   Depression Mother    Anxiety disorder Mother    Diabetes Mother    Heart failure Mother    Heart attack Father    Diabetes Sister    Obesity Sister    Drug abuse Brother    Alcohol abuse Brother    Depression Sister    Anxiety disorder Sister    Drug abuse Sister    Anxiety disorder Sister    Seizures Sister    Diabetes Sister    Depression Sister     Social History Social History   Tobacco Use   Smoking status: Former    Types: Cigarettes    Start date: 08/29/1979    Quit date: 11/04/2010    Years since quitting: 12.5   Smokeless tobacco: Former    Quit date: 11/19/2010   Tobacco comments:    1 to 1.5 packs per day  Vaping Use   Vaping Use: Never used  Substance Use Topics   Alcohol use: No    Alcohol/week: 0.0 standard drinks of alcohol   Drug use: No     Allergies   Benzoin, Doxycycline hyclate, Morphine, and Penicillins   Review of Systems Review of Systems  Musculoskeletal:  Positive for neck pain.  Neurological:  Positive for numbness. Negative for weakness.     Physical Exam Triage Vital Signs ED Triage Vitals  Enc Vitals Group     BP 05/12/23 1655 (!) 151/121     Pulse Rate 05/12/23 1655 (!) 106     Resp 05/12/23 1655 16     Temp 05/12/23 1655 98.5 F (36.9 C)     Temp Source 05/12/23 1655 Oral     SpO2 05/12/23 1655 98 %     Weight 05/12/23 1655 194 lb (88 kg)     Height 05/12/23 1655 6' (1.829 m)     Head Circumference --      Peak Flow --      Pain Score 05/12/23 1658 8     Pain Loc --  Pain Edu? --      Excl. in GC? --     No data found.  Updated Vital Signs BP (!) 151/121 (BP Location: Left Arm)   Pulse (!) 106   Temp 98.5 F (36.9 C) (Oral)   Resp 16   Ht 6' (1.829 m)   Wt 194 lb (88 kg)   SpO2 98%   BMI 26.31 kg/m   Visual Acuity Right Eye Distance:   Left Eye Distance:   Bilateral Distance:    Right Eye Near:   Left Eye Near:    Bilateral Near:     Physical Exam Vitals and nursing note reviewed.  Constitutional:      Appearance: Normal appearance. He is not ill-appearing.  HENT:     Head: Normocephalic and atraumatic.  Musculoskeletal:        General: Tenderness present. No swelling or signs of injury.  Skin:    General: Skin is warm and dry.     Capillary Refill: Capillary refill takes less than 2 seconds.     Findings: No bruising or erythema.  Neurological:     Mental Status: He is alert and oriented to person, place, and time.     Motor: No weakness.      UC Treatments / Results  Labs (all labs ordered are listed, but only abnormal results are displayed) Labs Reviewed - No data to display  EKG   Radiology No results found.  Procedures Procedures (including critical care time)  Medications Ordered in UC Medications  dexamethasone (DECADRON) injection 10 mg (has no administration in time range)    Initial Impression / Assessment and Plan / UC Course  I have reviewed the triage vital signs and the nursing notes.  Pertinent labs & imaging results that were available during my care of the patient were reviewed by me and considered in my medical decision making (see chart for details).   Patient is a nontoxic-appearing 61 year old male presenting for evaluation of pain in the right shoulder neck as outlined in HPI above.  On exam patient does have midline and right paracervical spinous tenderness.  No step-offs or crepitus noted.  Patient had an MRI of his cervical spine performed 11/19/2022 that showed multilevel degenerative changes with effacement of the ventral  CSF and mild mass effect on the spinal cord at C3-C4.  Also multilevel right neuroforaminal narrowing moderate at C4-C5, but also present at C5-C6 and C6-C7.  The patient is moving all extremities equally.  I have advised him that the medications that he is already prescribed exceed what I can prescribe for him from the urgent care environment.  I believe his pain is coming from his cervical radiculopathy.  I did offer him an injection of Decadron in clinic and I have advised him to follow-up with his spine specialist.  I have also given him Dr. Ronnell Guadalajara information, a neurosurgeon specializing in cervical spine, that he may contact for an appointment and to discuss treatment options.  I have advised the patient that there is nothing further that we can do for him in the urgent care environment for this condition.   Final Clinical Impressions(s) / UC Diagnoses   Final diagnoses:  Cervical disc disorder with radiculopathy of cervical region     Discharge Instructions      Call and make an appointment with Dr. Rise Mu at [facility] to evaluate your neck pain and provide treatment options.  We have given you a dose of steroids in clinic that  will help with the pain and calm down your nerves in your neck.  Continue all of your current medication as prescribed.        ED Prescriptions   None    PDMP not reviewed this encounter.   Dale Augusta, NP 05/12/23 1726

## 2023-05-12 NOTE — Telephone Encounter (Signed)
Refused Metformin 1000 mg because this request is from [facility]. On 02/10/2023 #180, 3 refills was sent to [facility] Mail order pharmacy.

## 2023-05-12 NOTE — Discharge Instructions (Signed)
Call and make an appointment with Dr. Rise Mu at [facility] to evaluate your neck pain and provide treatment options.  We have given you a dose of steroids in clinic that will help with the pain and calm down your nerves in your neck.  Continue all of your current medication as prescribed.

## 2023-05-12 NOTE — ED Triage Notes (Signed)
Pt c/o R shoulder & neck pain x6 days. Denies any injuries, states pain usually happens when he sleeps the wrong way.

## 2023-05-20 ENCOUNTER — Other Ambulatory Visit: Payer: Self-pay | Admitting: Family Medicine

## 2023-05-20 DIAGNOSIS — K219 Gastro-esophageal reflux disease without esophagitis: Secondary | ICD-10-CM

## 2023-05-21 ENCOUNTER — Other Ambulatory Visit: Payer: Self-pay | Admitting: Family Medicine

## 2023-05-21 DIAGNOSIS — K219 Gastro-esophageal reflux disease without esophagitis: Secondary | ICD-10-CM

## 2023-05-21 MED ORDER — PANTOPRAZOLE SODIUM 40 MG PO TBEC
40.0000 mg | DELAYED_RELEASE_TABLET | Freq: Two times a day (BID) | ORAL | 3 refills | Status: DC
Start: 2023-05-21 — End: 2024-05-05

## 2023-05-21 NOTE — Telephone Encounter (Signed)
Requested Prescriptions  Pending Prescriptions Disp Refills   pantoprazole (PROTONIX) 40 MG tablet 180 tablet 3    Sig: Take 1 tablet (40 mg total) by mouth 2 (two) times daily before a meal.     Gastroenterology: Proton Pump Inhibitors Passed - 05/21/2023 11:11 AM      Passed - Valid encounter within last 12 months    Recent Outpatient Visits           1 month ago Hyperlipidemia associated with type 2 diabetes mellitus [facility])   [place] Delles, Gentry Fitz A, RPH-CPP   2 months ago Type 2 diabetes, controlled, with neuropathy [facility])   [place] [facility] [place], Netta Neat, DO   7 months ago Other long term (current) drug therapy   [place] [facility] Delles, Gentry Fitz A, RPH-CPP   8 months ago Postoperative wound infection   [place] [facility] Smitty Cords, DO   8 months ago Essential (primary) hypertension   [place] [facility] Delles, Jackelyn Poling, RPH-CPP       Future Appointments             In 3 months Althea Charon, Netta Neat, DO  [facility]   In 3 months Stoioff, Verna Czech, MD [facility] [place]

## 2023-05-21 NOTE — Telephone Encounter (Signed)
Unable to refill per protocol, Rx  request is too soon. Last refill 02/06/23 for 90 and 3 refills.  Requested Prescriptions  Pending Prescriptions Disp Refills   pantoprazole (PROTONIX) 40 MG tablet [Pharmacy Med Name: PANTOPRAZOLE SOD DR 40 MG TAB] 180 tablet 1    Sig: TAKE 1 TABLET BY MOUTH TWICE A DAY     Gastroenterology: Proton Pump Inhibitors Passed - 05/20/2023  9:45 AM      Passed - Valid encounter within last 12 months    Recent Outpatient Visits           1 month ago Hyperlipidemia associated with type 2 diabetes mellitus [facility])   [place] [facility] Delles, Gentry Fitz A, RPH-CPP   2 months ago Type 2 diabetes, controlled, with neuropathy [facility])   [place] [facility], Netta Neat, DO   7 months ago Other long term (current) drug therapy   [place] [facility] Delles, Gentry Fitz A, RPH-CPP   8 months ago Postoperative wound infection   [place] [facility] Smitty Cords, DO   8 months ago Essential (primary) hypertension   [place] [facility] Delles, Jackelyn Poling, RPH-CPP       Future Appointments             In 3 months Althea Charon, Netta Neat, DO [place] [facility]   In 3 months Stoioff, Verna Czech, MD [facility] 

## 2023-05-21 NOTE — Telephone Encounter (Signed)
Pt called requesting for his prescription for pantoprazole [facility], he says that his prescription was sent to [facility] without his consent and that they cannot give him his refill soon enough. He is completely out and does not want to wait for mail order he says.

## 2023-05-22 DIAGNOSIS — Z09 Encounter for follow-up examination after completed treatment for conditions other than malignant neoplasm: Secondary | ICD-10-CM | POA: Diagnosis not present

## 2023-05-22 DIAGNOSIS — M62838 Other muscle spasm: Secondary | ICD-10-CM | POA: Diagnosis not present

## 2023-05-22 DIAGNOSIS — M2021 Hallux rigidus, right foot: Secondary | ICD-10-CM | POA: Diagnosis not present

## 2023-05-22 DIAGNOSIS — M542 Cervicalgia: Secondary | ICD-10-CM | POA: Diagnosis not present

## 2023-05-22 DIAGNOSIS — M79671 Pain in right foot: Secondary | ICD-10-CM | POA: Diagnosis not present

## 2023-05-26 ENCOUNTER — Telehealth: Payer: Medicare HMO

## 2023-05-26 ENCOUNTER — Telehealth: Payer: Self-pay | Admitting: Pharmacist

## 2023-05-26 ENCOUNTER — Telehealth: Payer: Self-pay

## 2023-05-26 NOTE — Progress Notes (Signed)
   Outreach Note  05/26/2023 Name: Yair Dusza. MRN: [id number] DOB: 06-04-1961  Referred by: Smitty Cords, DO Reason for referral : No chief complaint on file.   Was unable to reach patient via telephone today and have left HIPAA compliant voicemail asking patient to return my call. Outreach attempt #2   Follow Up Plan: Will attempt to reach patient by telephone again within the next 30 days   Estelle Grumbles, PharmD, [facility] Clinical Pharmacist [facility] [phone]

## 2023-05-26 NOTE — Telephone Encounter (Signed)
Left message advising patient ozempic samples are ready for pick up.

## 2023-06-04 DIAGNOSIS — M961 Postlaminectomy syndrome, not elsewhere classified: Secondary | ICD-10-CM | POA: Diagnosis not present

## 2023-06-04 DIAGNOSIS — M542 Cervicalgia: Secondary | ICD-10-CM | POA: Diagnosis not present

## 2023-06-13 ENCOUNTER — Other Ambulatory Visit: Payer: Self-pay | Admitting: Family Medicine

## 2023-06-13 DIAGNOSIS — R109 Unspecified abdominal pain: Secondary | ICD-10-CM

## 2023-06-16 ENCOUNTER — Ambulatory Visit: Payer: Medicare HMO | Admitting: Pharmacist

## 2023-06-16 DIAGNOSIS — I10 Essential (primary) hypertension: Secondary | ICD-10-CM

## 2023-06-16 NOTE — Progress Notes (Unsigned)
   06/16/2023 Name: Dale [name]. MRN: [id number] DOB: August 30, 1961  Chief Complaint  Patient presents with   Medication Management    Dale [name]. is a 62 y.o. year old male who was referred to the pharmacist by their PCP for assistance in managing diabetes, hypertension, hyperlipidemia, and medication access.   Receive a call from patient today regarding his blood pressure    Hypertension:   Current medications:  - Lisinopril 5 mg daily - Propranolol IR 40mg  three times daily (for tremor from Neurology)   Medications previously tried: HCTZ (stopped due to hypotension)   Patient has an automated, upper arm home BP cuff.   Unable to review specific home readings today as patient not currently home, but reports recent readings ranging in 90s/60s   From review of chart, note blood pressure at office visit with [facility] management on 06/04/2023 was 97/70, HR 72  Reports recently having dizziness when standing up even when takes positional change slowly     Objective:  Lab Results  Component Value Date   CREATININE 0.89 08/07/2022   BUN 18 08/07/2022   NA 140 08/07/2022   K 5.1 08/07/2022   CL 100 08/07/2022   CO2 31 08/07/2022   BP Readings from Last 3 Encounters:  05/12/23 (!) 151/121  02/27/23 138/88  09/18/22 134/70   Pulse Readings from Last 3 Encounters:  05/12/23 (!) 106  02/27/23 67  09/18/22 64     Assessment/Plan:   Unable to complete medication review or review of other conditions today as patient not currently home  Hypertension: - Collaborate with NP Dale [name], provider covering for PCP, regarding patient's reported recent home blood pressure readings and symptom of dizziness.  Provider agrees with plan to have patient hold his lisinopril for now, monitor home blood pressure and then  follow up with him again for home readings in 1-2 weeks  Follow up with patient to provide this guidance - Reviewed appropriate blood pressure  monitoring technique and reviewed goal blood pressure.  - Recommend to continue to monitor home blood pressure, keep log of results and have this record to review at upcoming medical appointments. Patient to contact provider office sooner if needed for readings outside of established parameters or symptoms    Follow Up Plan: Clinical Pharmacist will outreach to patient by telephone on 06/30/2023 at 10:45 AM   Dale [name], PharmD, [facility] Clinical Pharmacist [facility] [phone]

## 2023-06-16 NOTE — Telephone Encounter (Signed)
Requested medication (s) are due for refill today:   Requested medication (s) are on the active medication list: Yes  Last refill:  04/02/23  Future visit scheduled: Yes  Notes to clinic:  Pharmacy requests 90 day supply and diagnosis code.    Requested Prescriptions  Pending Prescriptions Disp Refills   dicyclomine (BENTYL) 20 MG tablet [Pharmacy Med Name: DICYCLOMINE 20 MG TABLET] 270 tablet 2    Sig: TAKE 1 TABLET (20 MG TOTAL) BY MOUTH 3 TIMES DAILY BEFORE MEALS. AS NEEDED FOR ABDOMINAL CRAMPING     Gastroenterology:  Antispasmodic Agents Passed - 06/13/2023  1:31 PM      Passed - Valid encounter within last 12 months    Recent Outpatient Visits           2 months ago Hyperlipidemia associated with type 2 diabetes mellitus [facility])   [facility] Delles, Gentry Fitz A, RPH-CPP   3 months ago Type 2 diabetes, controlled, with neuropathy [facility])   [place] [facility], Netta Neat, DO   8 months ago Other long term (current) drug therapy   [place] [facility] Delles, Gentry Fitz A, RPH-CPP   9 months ago Postoperative wound infection   [place] [facility] Smitty Cords, DO   9 months ago Essential (primary) hypertension   [facility] Delles, Jackelyn Poling, RPH-CPP       Future Appointments             In 2 months Althea Charon, Netta Neat, DO  [facility], [facility]   In 2 months Lonna Cobb, Verna Czech, MD [facility]

## 2023-06-17 NOTE — Patient Instructions (Signed)
Check your blood pressure once daily, and any time you have concerning symptoms like headache, chest pain, dizziness, shortness of breath, or vision changes.   Our goal is less than 130/80.  To appropriately check your blood pressure, make sure you do the following:  1) Avoid caffeine, exercise, or tobacco products for 30 minutes before checking. Empty your bladder. 2) Sit with your back supported in a flat-backed chair. Rest your arm on something flat (arm of the chair, table, etc). 3) Sit still with your feet flat on the floor, resting, for at least 5 minutes.  4) Check your blood pressure. Take 1-2 readings.  5) Write down these readings and bring with you to any provider appointments.  Bring your home blood pressure machine with you to a provider's office for accuracy comparison at least once a year.   Make sure you take your blood pressure medications before you come to any office visit, even if you were asked to fast for labs.  Elisabeth Delles, PharmD, BCACP Clinical Pharmacist [facility] [phone]  

## 2023-06-30 ENCOUNTER — Ambulatory Visit: Payer: Medicare PPO | Admitting: Pharmacist

## 2023-06-30 DIAGNOSIS — E1140 Type 2 diabetes mellitus with diabetic neuropathy, unspecified: Secondary | ICD-10-CM

## 2023-06-30 DIAGNOSIS — I10 Essential (primary) hypertension: Secondary | ICD-10-CM

## 2023-06-30 NOTE — Progress Notes (Unsigned)
06/30/2023 Name: Dale [name]. MRN: [id number] DOB: 28-Oct-1961  Chief Complaint  Patient presents with   Medication Management    Tuan Delsignore. is a 62 y.o. year old male who presented for a telephone visit.   They were referred to the pharmacist by their PCP for assistance in managing diabetes, hypertension, hyperlipidemia, and medication access.    Subjective:  Care Team: Primary Care Provider: Smitty Cords, DO ; Next Scheduled Visit: 08/29/2023 Neurologist: Jolene Provost, MD Psychiatrist: Maralyn Sago, MD; Next Scheduled Visit: 07/08/2023 Pain Management: [facility]; Next Scheduled Visit: 07/17/2023 Orthopedics: [facility] Urology: Riki Altes, MD; Next Scheduled Visit: 09/03/2023 Gastroenterology: [facility] Cardiology: Olena Heckle, MD   Medication Access/Adherence  Current Pharmacy:  [facility] [phone] [facility], [place] - [address] [place] [address] Phone: [phone] Fax: [phone]  [facility] - [place], [place] - [address] [place] [address] Phone: [phone] Fax: [phone]   Patient reports affordability concerns with their medications: No  Patient reports access/transportation concerns to their pharmacy: No  Patient reports adherence concerns with their medications:  No    Denies using weekly pillbox; has own system; denies missed doses  Hypertension:   Current medications:  - Propranolol IR 40mg  three times daily - Confirms stopped lisinopril 5 mg due to hypotension   Medications previously tried: HCTZ (stopped due to hypotension)   Patient has an automated, upper arm home BP cuff. Reports last checked: - Today: 135/86, HR 66 - 8/25: 134/95, HR 66 - 8/24: 106/77, HR 68 *Attributes variation in BP readings to life stress. Reports working on taking deep breaths to help with stress management   Denies further  symptoms of hypotension, such as dizziness, since stopped lisinopril    Current physical activity: Limited by recent foot surgeries, but now able to walk again     Diabetes:   Current medications:  - metformin 1000 mg twice daily - Ozempic 1 mg weekly   Current glucose readings: reports recent morning fasting readings: - Today: 89 - 8/25: 100 - 8/24: 78   Denies symptoms of hypolycemia   Current physical activity: Limited by recent foot surgeries, but now able to walk again   Statin: atorvastatin 10 mg daily   Current medication access support: Enrolled in patient assistance for Ozempic through [employer] through 11/04/2023    Objective:  Lab Results  Component Value Date   HGBA1C 5.3 02/27/2023    Lab Results  Component Value Date   CREATININE 0.89 08/07/2022   BUN 18 08/07/2022   NA 140 08/07/2022   K 5.1 08/07/2022   CL 100 08/07/2022   CO2 31 08/07/2022    Lab Results  Component Value Date   CHOL 123 08/07/2022   HDL 38 (L) 08/07/2022   LDLCALC 62 08/07/2022   TRIG 145 08/07/2022   CHOLHDL 3.2 08/07/2022   BP Readings from Last 3 Encounters:  05/12/23 (!) 151/121  02/27/23 138/88  09/18/22 134/70   Pulse Readings from Last 3 Encounters:  05/12/23 (!) 106  02/27/23 67  09/18/22 64     Medications Reviewed Today     Reviewed by Manuela Neptune, RPH-CPP (Pharmacist) on 06/30/23 at 1057  Med List Status: <None>   Medication Order Taking? Sig Documenting Provider Last Dose Status Informant  acetaminophen (TYLENOL) 500 MG tablet [id number] Yes Take 500 mg by mouth 2 (two) times daily as  needed. [provider] Taking Active   ALPRAZolam Prudy Feeler) 0.5 MG tablet [id number] Yes Take 0.5 mg by mouth 2 (two) times daily as needed. [provider] Taking Active   amphetamine-dextroamphetamine (ADDERALL) 10 MG tablet [id number]  Take 1 tablet (10 mg total) by mouth in the morning. [provider]  Active   aspirin EC 81 MG  tablet [id number] Yes Take 81 mg by mouth daily. [provider] Taking Active   atorvastatin (LIPITOR) 10 MG tablet [id number] Yes Take 1 tablet (10 mg total) by mouth daily. Smitty Cords, DO Taking Active   b complex vitamins capsule [id number] Yes Take by mouth. [provider] Taking Active   Blood Glucose Monitoring Suppl (TRUE METRIX AIR GLUCOSE METER) w/Device KIT [id number]  Check blood sugar 2 times daily Smitty Cords, DO  Active   buPROPion (WELLBUTRIN XL) 150 MG 24 hr tablet [id number] Yes Take 150 mg by mouth daily. [provider] Taking Active   Cholecalciferol 25 MCG (1000 UT) TBDP [id number] Yes Take 2 tablets by mouth daily. [provider] Taking Active   CINNAMON PO [id number] No Take by mouth.  Patient not taking: Reported on 06/30/2023   [provider] Not Taking Active   Coenzyme Q10 100 MG TABS [id number] Yes Take 100 mg by mouth daily. [provider] Taking Active   cyanocobalamin 1000 MCG tablet [id number] Yes Take 1 tablet by mouth daily. [provider] Taking Active   desoximetasone (TOPICORT) 0.25 % cream [id number] Yes Apply 1 Application topically 2 (two) times daily as needed. Smitty Cords, DO Taking Active   diclofenac sodium (VOLTAREN) 1 % GEL [id number] Yes Apply topically. [provider] Taking Active   dicyclomine (BENTYL) 20 MG tablet [id number] Yes TAKE 1 TABLET (20 MG TOTAL) BY MOUTH 3 TIMES DAILY BEFORE MEALS. AS NEEDED FOR ABDOMINAL CRAMPING Lorre Munroe, NP Taking Active   DULoxetine (CYMBALTA) 60 MG capsule [id number] Yes Take 60 mg by mouth 2 (two) times daily. [provider] Taking Active   gabapentin (NEURONTIN) 300 MG capsule [id number] Yes Take 3 capsules by mouth 3 (three) times daily. [provider] Taking Active            Med Note De Nurse, Bronwen Pendergraft A   Wed Nov 08, 2020  4:56 PM) Reports taking 900 mg in the morning and at noon,  and take 1200 mg (4 capsules) nightly as directed by [facility]  Glucosamine-Chondroitin 250-200 MG TABS [id number] Yes Take by mouth. [provider] Taking Active   hydrOXYzine (ATARAX) 25 MG tablet [id number] Yes Take 25 mg by mouth 2 (two) times daily as needed. [provider] Taking Active   ibuprofen (ADVIL) 200 MG tablet [id number] Yes Take 600 mg by mouth 2 (two) times daily as needed. [provider] Taking Active   lidocaine (LIDODERM) 5 % [id number] Yes SMARTSIG:Topical [provider] Taking Active   metFORMIN (GLUCOPHAGE) 1000 MG tablet [id number] Yes TAKE 1 TABLET (1,000 MG TOTAL) BY MOUTH TWICE A DAY WITH FOOD Smitty Cords, DO Taking Active   Multiple Vitamin (MULTIVITAMIN) tablet [id number] Yes Take 1 tablet by mouth daily. [provider] Taking Active   Omega-3 Fatty Acids (FISH OIL) 1000 MG CAPS [id number] Yes Take by mouth. [provider] Taking Active   OZEMPIC, 1 MG/DOSE, 4 MG/3ML SOPN [id number] Yes Inject 1 mg into the skin once a week. Smitty Cords, DO Taking Active   pantoprazole (PROTONIX) 40 MG tablet  [id number] Yes Take 1 tablet (40 mg total) by mouth 2 (two) times daily before a meal. Althea Charon, Netta Neat, DO Taking Active   propranolol (INDERAL) 40 MG tablet [id number] Yes TAKE 1 TABLET BY MOUTH THREE TIMES A DAY Smitty Cords, DO Taking Active   [facility] injection [id number]  Inject 0.5 mL into muscle for shingles vaccine. 2nd dose Smitty Cords, DO  Active   tamsulosin [facility]) 0.4 MG CAPS capsule [id number] Yes Take 2 capsules (0.8 mg total) by mouth daily. Smitty Cords, DO Taking Active   testosterone cypionate (DEPOTESTOSTERONE CYPIONATE) 200 MG/ML injection [id number] Yes INJECT 1 ML (200 MG TOTAL) INTO THE MUSCLE EVERY 14 DAYS Stoioff, Verna Czech, MD Taking Active   tiZANidine (ZANAFLEX) 2 MG tablet [id number] Yes Take 2 mg by mouth at bedtime as  needed. [provider] Taking Active   tiZANidine (ZANAFLEX) 4 MG tablet [id number] Yes 1 tablet twice daily, May take an additional 1/2 tablet at bedtime as needed [provider] Taking Active   traMADol (ULTRAM) 50 MG tablet [id number] Yes Take 50 mg by mouth every 6 (six) hours as needed. [provider] Taking Active   traZODone (DESYREL) 50 MG tablet [id number] Yes Take 25 mg by mouth at bedtime as needed. [provider] Taking Active   TRUE METRIX BLOOD GLUCOSE TEST test strip [id number]  Check blood sugar 2 times a day Smitty Cords, DO  Active               Assessment/Plan:   Comprehensive medication review performed; medication list updated in electronic medical record  - Caution patient for risk of dizziness/sedation with alprazolam, gabapentin, hydroxyzine, tizanidine, tramadol and trazodone particularly when these medications taken in combination              Patient advises uses caution             Review to use hydroxyzine only as prescribed as needed     Encourage patient to [facility] regarding scheduling to receive 2nd dose of Shingrix vaccination   Diabetes: - Currently controlled - Recommend to continue to monitor home blood sugar and to contact office if needed for readings outside of established parameters or new symptoms    Hypertension: - Patient to continue home BP monitoring, keep log of results, have record to review at medical appointments and call providers for readings outside of established parameters, dizziness or new symptoms       Follow Up Plan: Clinical Pharmacist to follow up with patient by telephone again on 08/22/2023 at 10 am   Estelle Grumbles, PharmD, [place], CPP Clinical Pharmacist [facility] [phone]

## 2023-07-02 NOTE — Patient Instructions (Signed)
Goals Addressed             This Visit's Progress    Pharmacy Goals       Our goal A1c is less than 7%. This corresponds with fasting sugars less than 130 and 2 hour after meal sugars less than 180. Please check your blood sugar and keep a log of the results  Our goal bad cholesterol, or LDL, is less than 70 . This is why it is important to continue taking your atorvastatin  Check your blood pressure once daily, and any time you have concerning symptoms like headache, chest pain, dizziness, shortness of breath, or vision changes.   Our goal is less than 130/80.  To appropriately check your blood pressure, make sure you do the following:  1) Avoid caffeine, exercise, or tobacco products for 30 minutes before checking. Empty your bladder. 2) Sit with your back supported in a flat-backed chair. Rest your arm on something flat (arm of the chair, table, etc). 3) Sit still with your feet flat on the floor, resting, for at least 5 minutes.  4) Check your blood pressure. Take 1-2 readings.  5) Write down these readings and bring with you to any provider appointments.  Bring your home blood pressure machine with you to a provider's office for accuracy comparison at least once a year.   Make sure you take your blood pressure medications before you come to any office visit, even if you were asked to fast for labs.   Feel free to call me with any questions or concerns. I look forward to our next call!   Estelle Grumbles, PharmD, [place], CPP Clinical Pharmacist [facility] [phone]

## 2023-07-04 ENCOUNTER — Ambulatory Visit (INDEPENDENT_AMBULATORY_CARE_PROVIDER_SITE_OTHER): Payer: Medicare PPO

## 2023-07-04 VITALS — BP 110/72 | Ht 72.0 in | Wt 203.4 lb

## 2023-07-04 DIAGNOSIS — Z23 Encounter for immunization: Secondary | ICD-10-CM

## 2023-07-04 DIAGNOSIS — Z0000 Encounter for general adult medical examination without abnormal findings: Secondary | ICD-10-CM

## 2023-07-04 NOTE — Patient Instructions (Addendum)
Mr. Pippin , Thank you for taking time to come for your Medicare Wellness Visit. I appreciate your ongoing commitment to your health goals. Please review the following plan we discussed and let me know if I can assist you in the future.   Referrals/Orders/Follow-Ups/Clinician Recommendations: none  This is a list of the screening recommended for you and due dates:  Health Maintenance  Topic Date Due   Screening for Lung Cancer  Never done   Yearly kidney health urinalysis for diabetes  06/25/2016   COVID-19 Vaccine (6 - 2023-24 season) 05/07/2023   Yearly kidney function blood test for diabetes  08/08/2023   Complete foot exam   08/15/2023   Eye exam for diabetics  08/23/2023   Hemoglobin A1C  08/29/2023   Colon Cancer Screening  11/19/2023   Medicare Annual Wellness Visit  07/03/2024   DTaP/Tdap/Td vaccine (3 - Td or Tdap) 06/01/2029   Flu Shot  Completed   Hepatitis C Screening  Completed   HIV Screening  Completed   Zoster (Shingles) Vaccine  Completed   HPV Vaccine  Aged Out    Advanced directives: (ACP Link)Information on Advanced Care Planning can be found at [facility] Advance Health Care Directives Advance Health Care Directives ([email or web address]   Next Medicare Annual Wellness Visit scheduled for next year: Yes   07/08/24 @ 3:30 pm in person

## 2023-07-04 NOTE — Progress Notes (Signed)
Subjective:   Dale [name]. is a 62 y.o. male who presents for Medicare Annual/Subsequent preventive examination.  Visit Complete: In person   Review of Systems     Cardiac Risk Factors include: advanced age (>85men, >36 women);diabetes mellitus;dyslipidemia;hypertension;male gender;sedentary lifestyle     Objective:    Today's Vitals   07/04/23 0921 07/04/23 0924  BP: 110/72   Weight: 203 lb 6.4 oz (92.3 kg)   Height: 6' (1.829 m)   PainSc:  4    Body mass index is 27.59 kg/m.     07/04/2023    9:32 AM 06/28/2022   11:07 AM 03/26/2022   12:07 PM 09/12/2021   10:52 AM 06/19/2021   11:48 AM 11/18/2018    6:54 AM 10/10/2017    2:45 PM  Advanced Directives  Does Patient Have a Medical Advance Directive? No No No No No No No  Would patient like information on creating a medical advance directive? No - Patient declined No - Patient declined  No - Patient declined  No - Patient declined     Current Medications (verified) Outpatient Encounter Medications as of 07/04/2023  Medication Sig   acetaminophen (TYLENOL) 500 MG tablet Take 500 mg by mouth 2 (two) times daily as needed.   ALPRAZolam (XANAX) 0.5 MG tablet Take 0.5 mg by mouth 2 (two) times daily as needed.   aspirin EC 81 MG tablet Take 81 mg by mouth daily.   atorvastatin (LIPITOR) 10 MG tablet Take 1 tablet (10 mg total) by mouth daily.   b complex vitamins capsule Take by mouth.   Blood Glucose Monitoring Suppl (TRUE METRIX AIR GLUCOSE METER) w/Device KIT Check blood sugar 2 times daily   buPROPion (WELLBUTRIN XL) 150 MG 24 hr tablet Take 150 mg by mouth daily.   Cholecalciferol 25 MCG (1000 UT) TBDP Take 2 tablets by mouth daily.   CINNAMON PO Take by mouth.   Coenzyme Q10 100 MG TABS Take 100 mg by mouth daily.   cyanocobalamin 1000 MCG tablet Take 1 tablet by mouth daily.   desoximetasone (TOPICORT) 0.25 % cream Apply 1 Application topically 2 (two) times daily as needed.   diclofenac sodium (VOLTAREN) 1 %  GEL Apply topically.   dicyclomine (BENTYL) 20 MG tablet TAKE 1 TABLET (20 MG TOTAL) BY MOUTH 3 TIMES DAILY BEFORE MEALS. AS NEEDED FOR ABDOMINAL CRAMPING   DULoxetine (CYMBALTA) 60 MG capsule Take 60 mg by mouth 2 (two) times daily.   gabapentin (NEURONTIN) 300 MG capsule Take 3 capsules by mouth 3 (three) times daily.   Glucosamine-Chondroitin 250-200 MG TABS Take by mouth.   hydrOXYzine (ATARAX) 25 MG tablet Take 25 mg by mouth 2 (two) times daily as needed.   ibuprofen (ADVIL) 200 MG tablet Take 600 mg by mouth 2 (two) times daily as needed.   lidocaine (LIDODERM) 5 % SMARTSIG:Topical   metFORMIN (GLUCOPHAGE) 1000 MG tablet TAKE 1 TABLET (1,000 MG TOTAL) BY MOUTH TWICE A DAY WITH FOOD   Multiple Vitamin (MULTIVITAMIN) tablet Take 1 tablet by mouth daily.   Omega-3 Fatty Acids (FISH OIL) 1000 MG CAPS Take by mouth.   OZEMPIC, 1 MG/DOSE, 4 MG/3ML SOPN Inject 1 mg into the skin once a week.   pantoprazole (PROTONIX) 40 MG tablet Take 1 tablet (40 mg total) by mouth 2 (two) times daily before a meal.   propranolol (INDERAL) 40 MG tablet TAKE 1 TABLET BY MOUTH THREE TIMES A DAY   SHINGRIX injection Inject 0.5 mL into muscle  for shingles vaccine. 2nd dose   tamsulosin (FLOMAX) 0.4 MG CAPS capsule Take 2 capsules (0.8 mg total) by mouth daily.   testosterone cypionate (DEPOTESTOSTERONE CYPIONATE) 200 MG/ML injection INJECT 1 ML (200 MG TOTAL) INTO THE MUSCLE EVERY 14 DAYS   tiZANidine (ZANAFLEX) 2 MG tablet Take 2 mg by mouth at bedtime as needed.   tiZANidine (ZANAFLEX) 4 MG tablet 1 tablet twice daily, May take an additional 1/2 tablet at bedtime as needed   traMADol (ULTRAM) 50 MG tablet Take 50 mg by mouth every 6 (six) hours as needed.   traZODone (DESYREL) 50 MG tablet Take 25 mg by mouth at bedtime as needed.   TRUE METRIX BLOOD GLUCOSE TEST test strip Check blood sugar 2 times a day   amphetamine-dextroamphetamine (ADDERALL) 10 MG tablet Take 1 tablet (10 mg total) by mouth in the morning.  (Patient not taking: Reported on 07/04/2023)   No facility-administered encounter medications on file as of 07/04/2023.    Allergies (verified) Benzoin, Doxycycline hyclate, Morphine, and Penicillins   History: Past Medical History:  Diagnosis Date   ADHD (attention deficit hyperactivity disorder)    Anxiety    Coronary artery disease    Coronary artery disease    Coronary atherosclerosis    Depression    GERD (gastroesophageal reflux disease)    Glaucoma    Headache    migrane   Heart disease    Hyperlipidemia    Hypertension    Hypertriglyceridemia    Hypogonadism in male    MVP (mitral valve prolapse)    Narcolepsy and cataplexy    Non-alcoholic fatty liver disease    Past Surgical History:  Procedure Laterality Date   COLONOSCOPY WITH PROPOFOL N/A 11/18/2018   Procedure: COLONOSCOPY WITH PROPOFOL;  Surgeon: Scot Jun, MD;  Location: [facility] ENDOSCOPY;  Service: Endoscopy;  Laterality: N/A;   ESOPHAGOGASTRODUODENOSCOPY (EGD) WITH PROPOFOL N/A 11/18/2018   Procedure: ESOPHAGOGASTRODUODENOSCOPY (EGD) WITH PROPOFOL;  Surgeon: Scot Jun, MD;  Location: [facility] ENDOSCOPY;  Service: Endoscopy;  Laterality: N/A;   pain pump implant and removal Bilateral    Dec. 21 2019   SPINE SURGERY     Multiple surgeries, initial 2001, thoracic disc repair and recurrent revisions.   THROAT SURGERY     Family History  Problem Relation Age of Onset   Depression Mother    Anxiety disorder Mother    Diabetes Mother    Heart failure Mother    Heart attack Father    Diabetes Sister    Obesity Sister    Drug abuse Brother    Alcohol abuse Brother    Depression Sister    Anxiety disorder Sister    Drug abuse Sister    Anxiety disorder Sister    Seizures Sister    Diabetes Sister    Depression Sister    Social History   Socioeconomic History   Marital status: Married    Spouse name: Not on file   Number of children: Not on file   Years of education: 1 yr of college    Highest education level: Some college, no degree  Occupational History   Occupation: Disabled  Tobacco Use   Smoking status: Former    Current packs/day: 0.00    Types: Cigarettes    Start date: 08/29/1979    Quit date: 11/04/2010    Years since quitting: 12.6   Smokeless tobacco: Former    Quit date: 11/19/2010   Tobacco comments:    1 to 1.5 packs  per day  Vaping Use   Vaping status: Never Used  Substance and Sexual Activity   Alcohol use: No    Alcohol/week: 0.0 standard drinks of alcohol   Drug use: No   Sexual activity: Not Currently  Other Topics Concern   Not on file  Social History Narrative   Not on file   Social Determinants of Health   Financial Resource Strain: Low Risk  (07/04/2023)   Overall Financial Resource Strain (CARDIA)    Difficulty of Paying Living Expenses: Not very hard  Food Insecurity: No Food Insecurity (07/04/2023)   Hunger Vital Sign    Worried About Running Out of Food in the Last Year: Never true    Ran Out of Food in the Last Year: Never true  Transportation Needs: No Transportation Needs (07/04/2023)   PRAPARE - Administrator, Civil Service (Medical): No    Lack of Transportation (Non-Medical): No  Physical Activity: Insufficiently Active (07/04/2023)   Exercise Vital Sign    Days of Exercise per Week: 3 days    Minutes of Exercise per Session: 20 min  Stress: No Stress Concern Present (07/04/2023)   [employer] of Occupational Health - Occupational Stress Questionnaire    Feeling of Stress : Only a little  Social Connections: Moderately Integrated (07/04/2023)   Social Connection and Isolation Panel [NHANES]    Frequency of Communication with Friends and Family: More than three times a week    Frequency of Social Gatherings with Friends and Family: Never    Attends Religious Services: More than 4 times per year    Active Member of [employer] or Organizations: No    Attends Engineer, structural: Never    Marital  Status: Married    Tobacco Counseling Counseling given: Not Answered Tobacco comments: 1 to 1.5 packs per day   Clinical Intake:  Pre-visit preparation completed: Yes  Pain : 0-10 Pain Score: 4  Pain Type: Chronic pain Pain Location: Knee Pain Orientation: Right Pain Descriptors / Indicators: Aching, Discomfort Pain Onset: More than a month ago Pain Frequency: Intermittent     Nutritional Status: BMI 25 -29 Overweight Nutritional Risks: None Diabetes: Yes CBG done?: No Did pt. bring in CBG monitor from home?: No  How often do you need to have someone help you when you read instructions, pamphlets, or other written materials from your doctor or pharmacy?: 1 - Never  Interpreter Needed?: No  Information entered by :: Kennedy Bucker, LPN   Activities of Daily Living    07/04/2023    9:34 AM 02/27/2023   10:03 AM  In your present state of health, do you have any difficulty performing the following activities:  Hearing? 0 0  Vision? 0 0  Difficulty concentrating or making decisions? 0 1  Walking or climbing stairs? 1 1  Comment knee   Dressing or bathing? 0 0  Doing errands, shopping? 0 0  Preparing Food and eating ? N   Using the Toilet? N   In the past six months, have you accidently leaked urine? N   Do you have problems with loss of bowel control? N   Managing your Medications? N   Managing your Finances? N   Housekeeping or managing your Housekeeping? N     Patient Care Team: Smitty Cords, DO as PCP - General (Family Medicine) Ronney Asters, Jackelyn Poling, RPH-CPP as Pharmacist  Indicate any recent Medical Services you may have received from other than [facility] providers in the past  year (date may be approximate).     Assessment:   This is a routine wellness examination for Dale [name].  Hearing/Vision screen Hearing Screening - Comments:: No aids Vision Screening - Comments:: Wears glasses- Dr.Shade  Dietary issues and exercise activities discussed:      Goals Addressed             This Visit's Progress    DIET - INCREASE WATER INTAKE         Depression Screen    07/04/2023    9:30 AM 02/27/2023   10:02 AM 09/18/2022   11:08 AM 08/14/2022   10:59 AM 06/28/2022   11:04 AM 03/28/2022   10:58 AM 08/06/2021    2:49 PM  PHQ 2/9 Scores  PHQ - 2 Score 0 3 2 4  0 3 4  PHQ- 9 Score 0 9 6 11  0 5 7    Fall Risk    07/04/2023    9:33 AM 02/27/2023   10:02 AM 09/18/2022   11:08 AM 08/14/2022   10:59 AM 06/28/2022   11:08 AM  Fall Risk   Falls in the past year? 1 1 1 1 1   Number falls in past yr: 1 1 1 1 1   Injury with Fall? 0 1 1 1 1   Risk for fall due to : Other (Comment)  Impaired balance/gait;History of fall(s) Impaired balance/gait;History of fall(s) History of fall(s);Impaired balance/gait  Risk for fall due to: Comment dizziness      Follow up Falls evaluation completed;Falls prevention discussed  Falls evaluation completed Falls evaluation completed;Falls prevention discussed Falls evaluation completed;Falls prevention discussed    MEDICARE RISK AT HOME: Medicare Risk at Home Any stairs in or around the home?: Yes If so, are there any without handrails?: No Home free of loose throw rugs in walkways, pet beds, electrical cords, etc?: Yes Adequate lighting in your home to reduce risk of falls?: Yes Life alert?: No Use of a cane, walker or w/c?: Yes (cane) Grab bars in the bathroom?: Yes Shower chair or bench in shower?: Yes Elevated toilet seat or a handicapped toilet?: No  TIMED UP AND GO:  Was the test performed?  Yes  Length of time to ambulate 10 feet: 4 sec Gait steady and fast without use of assistive device    Cognitive Function:        07/04/2023    9:36 AM  6CIT Screen  What Year? 0 points  What month? 0 points  What time? 0 points  Count back from 20 0 points  Months in reverse 0 points  Repeat phrase 0 points  Total Score 0 points    Immunizations Immunization History  Administered Date(s)  Administered   Covid-19, Mrna,Vaccine(Spikevax)40yrs and older 03/12/2023   Hep A / Hep B 10/21/2014, 01/04/2015, 06/21/2015   Influenza,inj,Quad PF,6+ Mos 06/26/2018, 07/13/2019, 08/06/2021, 08/07/2022   Influenza-Unspecified 06/28/2018, 08/22/2020   Moderna Covid-19 Vaccine Bivalent Booster 27yrs & up 08/20/2021   Moderna Sars-Covid-2 Vaccination 01/25/2020, 02/04/2020, 09/12/2020   PNEUMOCOCCAL CONJUGATE-20 09/18/2022   PPD Test 02/06/2021   Pneumococcal Polysaccharide-23 07/18/2016   Tdap 07/19/2015, 06/02/2019   Zoster Recombinant(Shingrix) 08/14/2022    TDAP status: Up to date  Flu Vaccine status: Completed at today's visit  Pneumococcal vaccine status: Up to date  Covid-19 vaccine status: Completed vaccines  Qualifies for Shingles Vaccine? Yes   Zostavax completed No   Shingrix Completed?: No.    Education has been provided regarding the importance of this vaccine. Patient has been advised to call  insurance company to determine out of pocket expense if they have not yet received this vaccine. Advised may also receive vaccine at local pharmacy or Health Dept. Verbalized acceptance and understanding.  Screening Tests Health Maintenance  Topic Date Due   Lung Cancer Screening  Never done   Diabetic kidney evaluation - Urine ACR  06/25/2016   COVID-19 Vaccine (6 - 2023-24 season) 05/07/2023   INFLUENZA VACCINE  06/05/2023   Diabetic kidney evaluation - eGFR measurement  08/08/2023   FOOT EXAM  08/15/2023   OPHTHALMOLOGY EXAM  08/23/2023   HEMOGLOBIN A1C  08/29/2023   Colonoscopy  11/19/2023   Medicare Annual Wellness (AWV)  07/03/2024   DTaP/Tdap/Td (3 - Td or Tdap) 06/01/2029   Hepatitis C Screening  Completed   HIV Screening  Completed   Zoster Vaccines- Shingrix  Completed   HPV VACCINES  Aged Out  Needs 2nd Shingrix  Health Maintenance  Health Maintenance Due  Topic Date Due   Lung Cancer Screening  Never done   Diabetic kidney evaluation - Urine ACR   06/25/2016   COVID-19 Vaccine (6 - 2023-24 season) 05/07/2023   INFLUENZA VACCINE  06/05/2023    Colorectal cancer screening: Type of screening: Colonoscopy. Completed 11/18/18. Repeat every 5 years  Lung Cancer Screening: (Low Dose CT Chest recommended if Age 9-80 years, 20 pack-year currently smoking OR have quit w/in 15years.) does not qualify.    Additional Screening:  Hepatitis C Screening: does qualify; Completed 07/18/16  Vision Screening: Recommended annual ophthalmology exams for early detection of glaucoma and other disorders of the eye. Is the patient up to date with their annual eye exam?  Yes  Who is the provider or what is the name of the office in which the patient attends annual eye exams? Dr.Shade If pt is not established with a provider, would they like to be referred to a provider to establish care? No .   Dental Screening: Recommended annual dental exams for proper oral hygiene  Diabetic Foot Exam: Diabetic Foot Exam: Completed 08/14/22  Community Resource Referral / Chronic Care Management: CRR required this visit?  No   CCM required this visit?  No     Plan:     I have personally reviewed and noted the following in the patient's chart:   Medical and social history Use of alcohol, tobacco or illicit drugs  Current medications and supplements including opioid prescriptions. Patient is not currently taking opioid prescriptions. Functional ability and status Nutritional status Physical activity Advanced directives List of other physicians Hospitalizations, surgeries, and ER visits in previous 12 months Vitals Screenings to include cognitive, depression, and falls Referrals and appointments  In addition, I have reviewed and discussed with patient certain preventive protocols, quality metrics, and best practice recommendations. A written personalized care plan for preventive services as well as general preventive health recommendations were provided to  patient.     Hal Hope, LPN   1/61/0960   After Visit Summary: my chart  Nurse Notes: none

## 2023-07-08 DIAGNOSIS — R454 Irritability and anger: Secondary | ICD-10-CM | POA: Diagnosis not present

## 2023-07-08 DIAGNOSIS — G4700 Insomnia, unspecified: Secondary | ICD-10-CM | POA: Diagnosis not present

## 2023-07-08 DIAGNOSIS — F32A Depression, unspecified: Secondary | ICD-10-CM | POA: Diagnosis not present

## 2023-07-08 DIAGNOSIS — F909 Attention-deficit hyperactivity disorder, unspecified type: Secondary | ICD-10-CM | POA: Diagnosis not present

## 2023-07-08 DIAGNOSIS — F419 Anxiety disorder, unspecified: Secondary | ICD-10-CM | POA: Diagnosis not present

## 2023-07-17 DIAGNOSIS — Z7982 Long term (current) use of aspirin: Secondary | ICD-10-CM | POA: Diagnosis not present

## 2023-07-17 DIAGNOSIS — M4802 Spinal stenosis, cervical region: Secondary | ICD-10-CM | POA: Diagnosis not present

## 2023-07-17 DIAGNOSIS — G959 Disease of spinal cord, unspecified: Secondary | ICD-10-CM | POA: Diagnosis not present

## 2023-07-17 DIAGNOSIS — T8149XA Infection following a procedure, other surgical site, initial encounter: Secondary | ICD-10-CM | POA: Diagnosis not present

## 2023-07-17 DIAGNOSIS — M542 Cervicalgia: Secondary | ICD-10-CM | POA: Diagnosis not present

## 2023-07-17 DIAGNOSIS — E11628 Type 2 diabetes mellitus with other skin complications: Secondary | ICD-10-CM | POA: Diagnosis not present

## 2023-07-17 DIAGNOSIS — S91101A Unspecified open wound of right great toe without damage to nail, initial encounter: Secondary | ICD-10-CM | POA: Diagnosis not present

## 2023-07-23 ENCOUNTER — Encounter: Payer: Self-pay | Admitting: Internal Medicine

## 2023-07-24 ENCOUNTER — Encounter: Payer: Self-pay | Admitting: Internal Medicine

## 2023-07-24 ENCOUNTER — Ambulatory Visit (INDEPENDENT_AMBULATORY_CARE_PROVIDER_SITE_OTHER): Payer: Medicare PPO | Admitting: Internal Medicine

## 2023-07-24 ENCOUNTER — Ambulatory Visit
Admission: RE | Admit: 2023-07-24 | Discharge: 2023-07-24 | Disposition: A | Discharge status: Home / Self Care | Payer: Medicare PPO | Attending: Internal Medicine | Admitting: Internal Medicine

## 2023-07-24 ENCOUNTER — Ambulatory Visit
Admission: RE | Admit: 2023-07-24 | Discharge: 2023-07-24 | Disposition: A | Discharge status: Home / Self Care | Payer: Medicare PPO | Source: Ambulatory Visit | Attending: Internal Medicine | Admitting: Internal Medicine

## 2023-07-24 VITALS — BP 134/82 | HR 73 | Temp 96.6°F | Wt 198.0 lb

## 2023-07-24 DIAGNOSIS — E11621 Type 2 diabetes mellitus with foot ulcer: Secondary | ICD-10-CM | POA: Insufficient documentation

## 2023-07-24 DIAGNOSIS — L97512 Non-pressure chronic ulcer of other part of right foot with fat layer exposed: Secondary | ICD-10-CM | POA: Diagnosis not present

## 2023-07-24 DIAGNOSIS — M19071 Primary osteoarthritis, right ankle and foot: Secondary | ICD-10-CM | POA: Diagnosis not present

## 2023-07-24 DIAGNOSIS — G8929 Other chronic pain: Secondary | ICD-10-CM

## 2023-07-24 DIAGNOSIS — M542 Cervicalgia: Secondary | ICD-10-CM

## 2023-07-24 DIAGNOSIS — R202 Paresthesia of skin: Secondary | ICD-10-CM

## 2023-07-24 NOTE — Progress Notes (Signed)
Subjective:    Patient ID: Dale Hailey., male    DOB: [birth year], 61 y.o.   MRN: [id number]  HPI  Patient presents to clinic today with complaint of an open area on the underside of his right great toe.  He noticed this within the last 2 weeks.  He reports initially there was a callus there but it has opened up.  He reports he immediately took a 10-day prescription of clindamycin to prevent infection.  He was referred to the wound center by his pain management doctor however he reports he is not going to go to [facility] for these appointments.  He reports he has had multiple surgeries of his right great toe with hardware with previous infection that required IV antibiotics last year.  He denies fever, chills, nausea or vomiting.  He has a history of diabetes with neuropathy, last A1c 5.3%, 03/2023.  He also reports right side neck pain.  He reports this is an ongoing issue that seem to worsen about 10 days ago.  He reports restricted movement and the pain radiates in the right upper extremity along with numbness and tingling.  X-ray cervical spine from care everywhere showed:  IMPRESSION:   1. Negative for listhesis or dynamic instability.  2. Mild degenerative disease in the lower cervical spine.  3. Multilevel facet arthropathy, moderate on the right in the mid cervical spine.  He reports he is following with pain management for this and recently had a injection in his neck.  He also takes t ylenol, tramadol, tizanidine and gabapentin but he reports this is not providing much relief.  He reports the Lidoderm patches are not effective.  Review of Systems     Past Medical History:  Diagnosis Date   ADHD (attention deficit hyperactivity disorder)    Anxiety    Coronary artery disease    Coronary artery disease    Coronary atherosclerosis    Depression    GERD (gastroesophageal reflux disease)    Glaucoma    Headache    migrane   Heart disease    Hyperlipidemia    Hypertension     Hypertriglyceridemia    Hypogonadism in male    MVP (mitral valve prolapse)    Narcolepsy and cataplexy    Non-alcoholic fatty liver disease     Current Outpatient Medications  Medication Sig Dispense Refill   acetaminophen (TYLENOL) 500 MG tablet Take 500 mg by mouth 2 (two) times daily as needed.     ALPRAZolam (XANAX) 0.5 MG tablet Take 0.5 mg by mouth 2 (two) times daily as needed.     amphetamine-dextroamphetamine (ADDERALL) 10 MG tablet Take 1 tablet (10 mg total) by mouth in the morning. (Patient not taking: Reported on 07/04/2023)     aspirin EC 81 MG tablet Take 81 mg by mouth daily.     atorvastatin (LIPITOR) 10 MG tablet Take 1 tablet (10 mg total) by mouth daily. 90 tablet 1   b complex vitamins capsule Take by mouth.     Blood Glucose Monitoring Suppl (TRUE METRIX AIR GLUCOSE METER) w/Device KIT Check blood sugar 2 times daily 1 kit 0   buPROPion (WELLBUTRIN XL) 150 MG 24 hr tablet Take 150 mg by mouth daily.     Cholecalciferol 25 MCG (1000 UT) TBDP Take 2 tablets by mouth daily.     CINNAMON PO Take by mouth.     Coenzyme Q10 100 MG TABS Take 100 mg by mouth daily.  cyanocobalamin 1000 MCG tablet Take 1 tablet by mouth daily.     desoximetasone (TOPICORT) 0.25 % cream Apply 1 Application topically 2 (two) times daily as needed. 60 g 3   diclofenac sodium (VOLTAREN) 1 % GEL Apply topically.     dicyclomine (BENTYL) 20 MG tablet TAKE 1 TABLET (20 MG TOTAL) BY MOUTH 3 TIMES DAILY BEFORE MEALS. AS NEEDED FOR ABDOMINAL CRAMPING 270 tablet 0   DULoxetine (CYMBALTA) 60 MG capsule Take 60 mg by mouth 2 (two) times daily.     gabapentin (NEURONTIN) 300 MG capsule Take 3 capsules by mouth 3 (three) times daily.     Glucosamine-Chondroitin 250-200 MG TABS Take by mouth.     hydrOXYzine (ATARAX) 25 MG tablet Take 25 mg by mouth 2 (two) times daily as needed.     ibuprofen (ADVIL) 200 MG tablet Take 600 mg by mouth 2 (two) times daily as needed.     lidocaine (LIDODERM) 5 %  SMARTSIG:Topical     metFORMIN (GLUCOPHAGE) 1000 MG tablet TAKE 1 TABLET (1,000 MG TOTAL) BY MOUTH TWICE A DAY WITH FOOD 180 tablet 3   Multiple Vitamin (MULTIVITAMIN) tablet Take 1 tablet by mouth daily.     Omega-3 Fatty Acids (FISH OIL) 1000 MG CAPS Take by mouth.     OZEMPIC, 1 MG/DOSE, 4 MG/3ML SOPN Inject 1 mg into the skin once a week. 9 mL 1   pantoprazole (PROTONIX) 40 MG tablet Take 1 tablet (40 mg total) by mouth 2 (two) times daily before a meal. 180 tablet 3   propranolol (INDERAL) 40 MG tablet TAKE 1 TABLET BY MOUTH THREE TIMES A DAY 270 tablet 0   SHINGRIX injection Inject 0.5 mL into muscle for shingles vaccine. 2nd dose 0.5 mL 0   tamsulosin (FLOMAX) 0.4 MG CAPS capsule Take 2 capsules (0.8 mg total) by mouth daily. 180 capsule 3   testosterone cypionate (DEPOTESTOSTERONE CYPIONATE) 200 MG/ML injection INJECT 1 ML (200 MG TOTAL) INTO THE MUSCLE EVERY 14 DAYS 2 mL 3   tiZANidine (ZANAFLEX) 2 MG tablet Take 2 mg by mouth at bedtime as needed.     tiZANidine (ZANAFLEX) 4 MG tablet 1 tablet twice daily, May take an additional 1/2 tablet at bedtime as needed     traMADol (ULTRAM) 50 MG tablet Take 50 mg by mouth every 6 (six) hours as needed.     traZODone (DESYREL) 50 MG tablet Take 25 mg by mouth at bedtime as needed.     TRUE METRIX BLOOD GLUCOSE TEST test strip Check blood sugar 2 times a day 200 each 3   No current facility-administered medications for this visit.    Allergies  Allergen Reactions   Benzoin Other (See Comments)    blisters   Doxycycline Hyclate Nausea Only   Morphine Other (See Comments)   Penicillins Other (See Comments)    Family History  Problem Relation Age of Onset   Depression Mother    Anxiety disorder Mother    Diabetes Mother    Heart failure Mother    Heart attack Father    Diabetes Sister    Obesity Sister    Drug abuse Brother    Alcohol abuse Brother    Depression Sister    Anxiety disorder Sister    Drug abuse Sister    Anxiety  disorder Sister    Seizures Sister    Diabetes Sister    Depression Sister     Social History   Socioeconomic History   Marital  status: Married    Spouse name: Not on file   Number of children: Not on file   Years of education: 1 yr of college   Highest education level: Some college, no degree  Occupational History   Occupation: Disabled  Tobacco Use   Smoking status: Former    Current packs/day: 0.00    Types: Cigarettes    Start date: 08/29/1979    Quit date: 11/04/2010    Years since quitting: 12.7   Smokeless tobacco: Former    Quit date: 11/19/2010   Tobacco comments:    1 to 1.5 packs per day  Vaping Use   Vaping status: Never Used  Substance and Sexual Activity   Alcohol use: No    Alcohol/week: 0.0 standard drinks of alcohol   Drug use: No   Sexual activity: Not Currently  Other Topics Concern   Not on file  Social History Narrative   Not on file   Social Determinants of Health   Financial Resource Strain: Low Risk  (07/04/2023)   Overall Financial Resource Strain (CARDIA)    Difficulty of Paying Living Expenses: Not very hard  Food Insecurity: No Food Insecurity (07/04/2023)   Hunger Vital Sign    Worried About Running Out of Food in the Last Year: Never true    Ran Out of Food in the Last Year: Never true  Transportation Needs: No Transportation Needs (07/04/2023)   PRAPARE - Administrator, Civil Service (Medical): No    Lack of Transportation (Non-Medical): No  Physical Activity: Insufficiently Active (07/04/2023)   Exercise Vital Sign    Days of Exercise per Week: 3 days    Minutes of Exercise per Session: 20 min  Stress: No Stress Concern Present (07/04/2023)   [employer] of Occupational Health - Occupational Stress Questionnaire    Feeling of Stress : Only a little  Social Connections: Moderately Integrated (07/04/2023)   Social Connection and Isolation Panel [NHANES]    Frequency of Communication with Friends and Family: More than  three times a week    Frequency of Social Gatherings with Friends and Family: Never    Attends Religious Services: More than 4 times per year    Active Member of [employer] or Organizations: No    Attends Banker Meetings: Never    Marital Status: Married  Catering manager Violence: Not At Risk (07/04/2023)   Humiliation, Afraid, Rape, and Kick questionnaire    Fear of Current or Ex-Partner: No    Emotionally Abused: No    Physically Abused: No    Sexually Abused: No     Constitutional: Denies fever, malaise, fatigue, headache or abrupt weight changes.  Respiratory: Denies difficulty breathing, shortness of breath, cough or sputum production.   Cardiovascular: Denies chest pain, chest tightness, palpitations or swelling in the hands or feet.  Musculoskeletal: Patient reports right-sided neck pain, decrease in range of motion.  Denies difficulty with gait, muscle pain or joint swelling.  Skin: Patient reports open wound of right great toe.  Denies redness, rashes, lesions.  Neurological: Patient reports paresthesia of right upper extremity.  Denies dizziness, difficulty with memory, difficulty with speech or problems with balance and coordination.    No other specific complaints in a complete review of systems (except as listed in HPI above).  Objective:   Physical Exam  BP 134/82 (BP Location: Right Arm, Patient Position: Sitting, Cuff Size: Normal)   Pulse 73   Temp (!) 96.6 F (35.9 C) (Temporal)  Wt 198 lb (89.8 kg)   SpO2 96%   BMI 26.85 kg/m   Wt Readings from Last 3 Encounters:  07/04/23 203 lb 6.4 oz (92.3 kg)  05/12/23 194 lb (88 kg)  02/27/23 201 lb (91.2 kg)    General: Appears his stated age, overweight, chronically ill-appearing.  In NAD. Skin: Warm, dry and intact. Ulcerated callous noted on plantar aspect of right great toe.  Cardiovascular: Normal rate and rhythm. S1,S2 noted.  No murmur, rubs or gallops noted.  Pedal pulse 2+ on the  right. Pulmonary/Chest: Normal effort and positive vesicular breath sounds. No respiratory distress. No wheezes, rales or ronchi noted.  Musculoskeletal: Normal flexion, extension and rotation to the left of the cervical spine.  Decreased rotation to the right of the cervical spine secondary to pain.  Bony tenderness noted over C7.  Shoulder shrug equal.  Strength 5/5 BUE.  Handgrips equal.  Gait slow and steady without device. Neurological: Alert and oriented.  Sensation intact to right lower extremity.   BMET    Component Value Date/Time   NA 140 08/07/2022 0817   NA 142 11/12/2016 1320   NA 140 08/20/2013 1920   K 5.1 08/07/2022 0817   K 3.5 08/20/2013 1920   CL 100 08/07/2022 0817   CL 105 08/20/2013 1920   CO2 31 08/07/2022 0817   CO2 27 08/20/2013 1920   GLUCOSE 87 08/07/2022 0817   GLUCOSE 154 (H) 08/20/2013 1920   BUN 18 08/07/2022 0817   BUN 13 11/12/2016 1320   BUN 17 08/20/2013 1920   CREATININE 0.89 08/07/2022 0817   CALCIUM 10.1 08/07/2022 0817   CALCIUM 9.5 08/20/2013 1920   GFRNONAA 96 11/10/2020 1048   GFRAA 111 11/10/2020 1048    Lipid Panel     Component Value Date/Time   CHOL 123 08/07/2022 0817   CHOL 104 06/26/2015 1126   TRIG 145 08/07/2022 0817   HDL 38 (L) 08/07/2022 0817   HDL 30 (L) 06/26/2015 1126   CHOLHDL 3.2 08/07/2022 0817   VLDL 52 (H) 07/18/2016 1041   LDLCALC 62 08/07/2022 0817    CBC    Component Value Date/Time   WBC 7.0 08/07/2022 0817   RBC 5.26 08/07/2022 0817   HGB 14.8 08/07/2022 0817   HGB 16.7 03/07/2022 1023   HCT 47.3 03/03/2023 1002   PLT 322 08/07/2022 0817   PLT 305 06/26/2015 1126   MCV 86.9 08/07/2022 0817   MCV 87 06/26/2015 1126   MCV 89 08/20/2013 1920   MCH 28.1 08/07/2022 0817   MCHC 32.4 08/07/2022 0817   RDW 14.5 08/07/2022 0817   RDW 14.4 06/26/2015 1126   RDW 14.2 08/20/2013 1920   LYMPHSABS 2,688 08/07/2022 0817   LYMPHSABS 2.9 08/15/2013 1657   MONOABS 0.9 05/26/2017 1511   MONOABS 0.7  08/15/2013 1657   EOSABS 210 08/07/2022 0817   EOSABS 0.1 08/15/2013 1657   BASOSABS 28 08/07/2022 0817   BASOSABS 0.1 08/15/2013 1657    Hgb A1C Lab Results  Component Value Date   HGBA1C 5.3 02/27/2023           Assessment & Plan:   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:  X-ray right great toe today to rule out osteomyelitis Does not appear to be infected on exam and he recently completed a course of clindamycin Referral to wound clinic for further evaluation and treatment  Chronic neck pain with radicular symptoms:  He is already being followed by pain management for the  same and received an injection about 3 weeks ago per his report Advised him to continue Tylenol, tramadol, tizanidine and gabapentin He reports Lidoderm is not effective Advised him to follow-up with his pain management specialist for further evaluation and treatment  Follow-up with your PCP as previously scheduled Nicki Reaper, NP

## 2023-07-24 NOTE — Patient Instructions (Signed)
Neck Exercises Ask your health care provider which exercises are safe for you. Do exercises exactly as told by your health care provider and adjust them as directed. It is normal to feel mild stretching, pulling, tightness, or discomfort as you do these exercises. Stop right away if you feel sudden pain or your pain gets worse. Do not begin these exercises until told by your health care provider. Neck exercises can be important for many reasons. They can improve strength and maintain flexibility in your neck, which will help your upper back and prevent neck pain. Stretching exercises Rotation neck stretching  Sit in a chair or stand up. Place your feet flat on the floor, shoulder-width apart. Slowly turn your head (rotate) to the right until a slight stretch is felt. Turn it all the way to the right so you can look over your right shoulder. Do not tilt or tip your head. Hold this position for 10-30 seconds. Slowly turn your head (rotate) to the left until a slight stretch is felt. Turn it all the way to the left so you can look over your left shoulder. Do not tilt or tip your head. Hold this position for 10-30 seconds. Repeat __________ times. Complete this exercise __________ times a day. Neck retraction  Sit in a sturdy chair or stand up. Look straight ahead. Do not bend your neck. Use your fingers to push your chin backward (retraction). Do not bend your neck for this movement. Continue to face straight ahead. If you are doing the exercise properly, you will feel a slight sensation in your throat and a stretch at the back of your neck. Hold the stretch for 1-2 seconds. Repeat __________ times. Complete this exercise __________ times a day. Strengthening exercises Neck press  Lie on your back on a firm bed or on the floor with a pillow under your head. Use your neck muscles to push your head down on the pillow and straighten your spine. Hold the position as well as you can. Keep your head  facing up (in a neutral position) and your chin tucked. Slowly count to 5 while holding this position. Repeat __________ times. Complete this exercise __________ times a day. Isometrics These are exercises in which you strengthen the muscles in your neck while keeping your neck still (isometrics). Sit in a supportive chair and place your hand on your forehead. Keep your head and face facing straight ahead. Do not flex or extend your neck while doing isometrics. Push forward with your head and neck while pushing back with your hand. Hold for 10 seconds. Do the sequence again, this time putting your hand against the back of your head. Use your head and neck to push backward against the hand pressure. Finally, do the same exercise on either side of your head, pushing sideways against the pressure of your hand. Repeat __________ times. Complete this exercise __________ times a day. Prone head lifts  Lie face-down (prone position), resting on your elbows so that your chest and upper back are raised. Start with your head facing downward, near your chest. Position your chin either on or near your chest. Slowly lift your head upward. Lift until you are looking straight ahead. Then continue lifting your head as far back as you can comfortably stretch. Hold your head up for 5 seconds. Then slowly lower it to your starting position. Repeat __________ times. Complete this exercise __________ times a day. Supine head lifts  Lie on your back (supine position), bending your knees  to point to the ceiling and keeping your feet flat on the floor. Lift your head slowly off the floor, raising your chin toward your chest. Hold for 5 seconds. Repeat __________ times. Complete this exercise __________ times a day. Scapular retraction  Stand with your arms at your sides. Look straight ahead. Slowly pull both shoulders (scapulae) backward and downward (retraction) until you feel a stretch between your shoulder  blades in your upper back. Hold for 10-30 seconds. Relax and repeat. Repeat __________ times. Complete this exercise __________ times a day. Contact a health care provider if: Your neck pain or discomfort gets worse when you do an exercise. Your neck pain or discomfort does not improve within 2 hours after you exercise. If you have any of these problems, stop exercising right away. Do not do the exercises again unless your health care provider says that you can. Get help right away if: You develop sudden, severe neck pain. If this happens, stop exercising right away. Do not do the exercises again unless your health care provider says that you can. This information is not intended to replace advice given to you by your health care provider. Make sure you discuss any questions you have with your health care provider. Document Revised: 04/17/2021 Document Reviewed: 04/17/2021 Elsevier Patient Education  2024 [employer].

## 2023-08-05 ENCOUNTER — Telehealth: Payer: Self-pay | Admitting: Family Medicine

## 2023-08-05 ENCOUNTER — Ambulatory Visit: Payer: Self-pay | Admitting: *Deleted

## 2023-08-05 ENCOUNTER — Other Ambulatory Visit: Payer: Self-pay | Admitting: *Deleted

## 2023-08-05 DIAGNOSIS — G4700 Insomnia, unspecified: Secondary | ICD-10-CM | POA: Diagnosis not present

## 2023-08-05 DIAGNOSIS — I10 Essential (primary) hypertension: Secondary | ICD-10-CM

## 2023-08-05 DIAGNOSIS — F909 Attention-deficit hyperactivity disorder, unspecified type: Secondary | ICD-10-CM | POA: Diagnosis not present

## 2023-08-05 DIAGNOSIS — F32A Depression, unspecified: Secondary | ICD-10-CM | POA: Diagnosis not present

## 2023-08-05 DIAGNOSIS — K219 Gastro-esophageal reflux disease without esophagitis: Secondary | ICD-10-CM

## 2023-08-05 DIAGNOSIS — F419 Anxiety disorder, unspecified: Secondary | ICD-10-CM | POA: Diagnosis not present

## 2023-08-05 MED ORDER — PROPRANOLOL HCL 40 MG PO TABS
40.0000 mg | ORAL_TABLET | Freq: Three times a day (TID) | ORAL | 0 refills | Status: DC
Start: 2023-08-05 — End: 2023-10-30

## 2023-08-05 NOTE — Telephone Encounter (Signed)
  Chief Complaint: Sore on toe Symptoms: Open "Puncture type wound" right foot, great toe. States draining some "Not infected, I know what an infection looks like." Size of tip of index finger. Frequency: 3 weeks Pertinent Negatives: Patient denies  Disposition: [] ED /[] Urgent Care (no appt availability in office) / [x] Appointment(In office/virtual)/ []  [place] [facility]/ [] Home Care/ [] Refused Recommended Disposition /[] [place] [facility]/ []  Follow-up with PCP Additional Notes: Pt states is treating with Silvadene.  Appt secured, first available 08/11/23, pt requesting earlier appt. Please advise. Reason for Disposition  [1] Ulcer, wound, blister, sore, or red area AND [2] new or increasing    Pt is diabetic  Answer Assessment - Initial Assessment Questions 1. SYMPTOM: "What's the main symptom you're concerned about?" (e.g., rash, sore, callus, drainage, numbness)     Puncture wound 2. LOCATION: "Where is the   located?" (e.g., foot/toe, top/bottom, left/right)    Right great toe 3. APPEARANCE: "What does the area look like?" (e.g., normal, red, swollen; size)     Tip of index finger 4. ONSET: "When did the    start?"     3 weeks ago 5. PAIN: "Is there any pain?" If Yes, ask: "How bad is it?" (Scale: 1-10; mild, moderate, severe)     No "Can't feel any pain." 6. CAUSE: "What do you think is causing the symptoms?"     DM 7. OTHER SYMPTOMS: "Do you have any other symptoms?" (e.g., fever, weakness)     No  Protocols used: Diabetes - Foot Problems and Questions-A-AH

## 2023-08-05 NOTE — Telephone Encounter (Signed)
Requested Prescriptions  Pending Prescriptions Disp Refills   propranolol (INDERAL) 40 MG tablet 270 tablet 0    Sig: Take 1 tablet (40 mg total) by mouth 3 (three) times daily.     Cardiovascular:  Beta Blockers Passed - 08/05/2023  5:15 PM      Passed - Last BP in normal range    BP Readings from Last 1 Encounters:  07/24/23 134/82         Passed - Last Heart Rate in normal range    Pulse Readings from Last 1 Encounters:  07/24/23 73         Passed - Valid encounter within last 6 months    Recent Outpatient Visits           1 week ago Diabetic ulcer of toe of right foot associated with type 2 diabetes mellitus, with fat layer exposed [facility])   [facility] [place], Salvadore Oxford, NP   1 month ago Essential (primary) hypertension   [place] [facility] D[place], Gentry Fitz A, RPH-CPP   1 month ago Essential (primary) hypertension   [place] [facility] Delles, Gentry Fitz A, RPH-CPP   4 months ago Hyperlipidemia associated with type 2 diabetes mellitus [facility])   [place] [facility] Delles, Gentry Fitz A, RPH-CPP   5 months ago Type 2 diabetes, controlled, with neuropathy [facility])   [place] [facility] [place], Netta Neat, DO       Future Appointments             In 3 weeks Althea Charon, Netta Neat, DO  [facility]   In 4 weeks Lonna Cobb, Verna Czech, MD [facility] [place]

## 2023-08-05 NOTE — Telephone Encounter (Signed)
Copied from CRM [id number]. Topic: General - Other >> Aug 05, 2023 11:13 AM Everette C wrote: Reason for CRM: Medication Refill - Medication: pantoprazole (PROTONIX) 40 MG tablet [[id number]]  Has the patient contacted their pharmacy? Yes.   (Agent: If no, request that the patient contact the pharmacy for the refill. If patient does not wish to contact the pharmacy document the reason why and proceed with request.) (Agent: If yes, when and what did the pharmacy advise?)  Preferred Pharmacy (with phone number or street name): [phone] Dan Humphreys, [place] - [address] [place] [address] Phone: [phone] Fax: [phone] Hours: Not open 24 hours   Has the patient been seen for an appointment in the last year OR does the patient have an upcoming appointment? Yes.    Agent: Please be advised that RX refills may take up to 3 business days. We ask that you follow-up with your pharmacy.

## 2023-08-05 NOTE — Telephone Encounter (Signed)
[facility] and spoke to [place], [facility] about the refill(s) pantoprazole requested. Advised it was sent on 05/21/23 #180/3 refill(s). She says he picked up on 05/21/23 and the earliest refill is scheduled for 08/17/23 for insurance to pay. Patient called, left detailed VM of the above per DPR. Advised to call [facility] with questions.

## 2023-08-05 NOTE — Telephone Encounter (Signed)
There are some same day acute appointments available this week. Please contact patient to find which acute same day visit will work for him. Usually 1120am or 400pm are saved for this  [name], DO [facility] 08/05/2023, 7:16 PM

## 2023-08-06 NOTE — Telephone Encounter (Signed)
Tried calling; pt's number is not in service.  PEC please schedule if he calls back.   Thanks,   -Vernona Rieger

## 2023-08-11 ENCOUNTER — Ambulatory Visit: Payer: Medicare PPO | Admitting: Family Medicine

## 2023-08-13 DIAGNOSIS — M25461 Effusion, right knee: Secondary | ICD-10-CM | POA: Diagnosis not present

## 2023-08-13 DIAGNOSIS — E119 Type 2 diabetes mellitus without complications: Secondary | ICD-10-CM | POA: Diagnosis not present

## 2023-08-13 DIAGNOSIS — M961 Postlaminectomy syndrome, not elsewhere classified: Secondary | ICD-10-CM | POA: Diagnosis not present

## 2023-08-13 DIAGNOSIS — G894 Chronic pain syndrome: Secondary | ICD-10-CM | POA: Diagnosis not present

## 2023-08-13 DIAGNOSIS — M25561 Pain in right knee: Secondary | ICD-10-CM | POA: Diagnosis not present

## 2023-08-13 DIAGNOSIS — M5412 Radiculopathy, cervical region: Secondary | ICD-10-CM | POA: Diagnosis not present

## 2023-08-13 DIAGNOSIS — M542 Cervicalgia: Secondary | ICD-10-CM | POA: Diagnosis not present

## 2023-08-13 DIAGNOSIS — L97519 Non-pressure chronic ulcer of other part of right foot with unspecified severity: Secondary | ICD-10-CM | POA: Diagnosis not present

## 2023-08-13 DIAGNOSIS — I739 Peripheral vascular disease, unspecified: Secondary | ICD-10-CM | POA: Diagnosis not present

## 2023-08-13 DIAGNOSIS — M79671 Pain in right foot: Secondary | ICD-10-CM | POA: Diagnosis not present

## 2023-08-13 DIAGNOSIS — E11621 Type 2 diabetes mellitus with foot ulcer: Secondary | ICD-10-CM | POA: Diagnosis not present

## 2023-08-13 DIAGNOSIS — M170 Bilateral primary osteoarthritis of knee: Secondary | ICD-10-CM | POA: Diagnosis not present

## 2023-08-13 DIAGNOSIS — S91101A Unspecified open wound of right great toe without damage to nail, initial encounter: Secondary | ICD-10-CM | POA: Diagnosis not present

## 2023-08-13 DIAGNOSIS — M1711 Unilateral primary osteoarthritis, right knee: Secondary | ICD-10-CM | POA: Diagnosis not present

## 2023-08-19 ENCOUNTER — Ambulatory Visit: Payer: Medicare PPO | Admitting: Physician Assistant

## 2023-08-20 DIAGNOSIS — G8929 Other chronic pain: Secondary | ICD-10-CM | POA: Diagnosis not present

## 2023-08-20 DIAGNOSIS — M25561 Pain in right knee: Secondary | ICD-10-CM | POA: Diagnosis not present

## 2023-08-21 ENCOUNTER — Encounter: Payer: Self-pay | Admitting: Family Medicine

## 2023-08-21 DIAGNOSIS — E785 Hyperlipidemia, unspecified: Secondary | ICD-10-CM

## 2023-08-22 ENCOUNTER — Telehealth: Payer: Medicare PPO

## 2023-08-22 ENCOUNTER — Other Ambulatory Visit: Payer: Medicare HMO

## 2023-08-22 ENCOUNTER — Telehealth: Payer: Self-pay | Admitting: Family Medicine

## 2023-08-22 DIAGNOSIS — E1169 Type 2 diabetes mellitus with other specified complication: Secondary | ICD-10-CM

## 2023-08-22 DIAGNOSIS — Z0000 Encounter for general adult medical examination without abnormal findings: Secondary | ICD-10-CM | POA: Diagnosis not present

## 2023-08-22 DIAGNOSIS — N138 Other obstructive and reflux uropathy: Secondary | ICD-10-CM | POA: Diagnosis not present

## 2023-08-22 DIAGNOSIS — I10 Essential (primary) hypertension: Secondary | ICD-10-CM | POA: Diagnosis not present

## 2023-08-22 DIAGNOSIS — E785 Hyperlipidemia, unspecified: Secondary | ICD-10-CM | POA: Diagnosis not present

## 2023-08-22 DIAGNOSIS — E1140 Type 2 diabetes mellitus with diabetic neuropathy, unspecified: Secondary | ICD-10-CM | POA: Diagnosis not present

## 2023-08-22 DIAGNOSIS — N401 Enlarged prostate with lower urinary tract symptoms: Secondary | ICD-10-CM | POA: Diagnosis not present

## 2023-08-22 NOTE — Telephone Encounter (Signed)
Correct. We did receive it and I have sent the patient a mychart message about it yesterday 10/17 to get his approval first before I sign.  Once I hear back from patient, we can sign and fax the order if he agrees.  If you have time, would you be able to attempt to reach him by phone for a quick approval or denial?  If not we can give him time to respond on mychart.  Thank you  [name], DO [facility] 08/22/2023, 12:41 PM

## 2023-08-22 NOTE — Telephone Encounter (Signed)
Attempted to call patient. No answer. Received an automated message that call cannot be completed as dialed.

## 2023-08-22 NOTE — Telephone Encounter (Signed)
[place] with [facility] has called in regards to confirm if Dr Kirtland Bouchard has received a lab request that was faxed over on 08/20/2023  on behalf of the patient. Zola Button that per last encounter in chart, fax was received and Dr Kirtland Bouchard had to reach out to patient to confirm with him before signing. Please advise.  Per Toma Copier this needs to be signed and dated & faxed back if approved, but if denied PCP needs to write "denied" with an explanation and fax back  [facility] Fax # [phone] OR [phone]  [facility] Callback Phone # [phone]  REF # [id number]

## 2023-08-23 LAB — CBC WITH DIFFERENTIAL/PLATELET
Absolute Lymphocytes: 2100 {cells}/uL (ref 850–3900)
Absolute Monocytes: 870 {cells}/uL (ref 200–950)
Basophils Absolute: 30 {cells}/uL (ref 0–200)
Basophils Relative: 0.4 %
Eosinophils Absolute: 143 {cells}/uL (ref 15–500)
Eosinophils Relative: 1.9 %
HCT: 47.7 % (ref 38.5–50.0)
Hemoglobin: 15.1 g/dL (ref 13.2–17.1)
MCH: 28 pg (ref 27.0–33.0)
MCHC: 31.7 g/dL — ABNORMAL LOW (ref 32.0–36.0)
MCV: 88.3 fL (ref 80.0–100.0)
MPV: 10.7 fL (ref 7.5–12.5)
Monocytes Relative: 11.6 %
Neutro Abs: 4358 {cells}/uL (ref 1500–7800)
Neutrophils Relative %: 58.1 %
Platelets: 262 10*3/uL (ref 140–400)
RBC: 5.4 10*6/uL (ref 4.20–5.80)
RDW: 13.6 % (ref 11.0–15.0)
Total Lymphocyte: 28 %
WBC: 7.5 10*3/uL (ref 3.8–10.8)

## 2023-08-23 LAB — COMPLETE METABOLIC PANEL WITH GFR
AG Ratio: 2.1 (calc) (ref 1.0–2.5)
ALT: 13 U/L (ref 9–46)
AST: 17 U/L (ref 10–35)
Albumin: 4.6 g/dL (ref 3.6–5.1)
Alkaline phosphatase (APISO): 82 U/L (ref 35–144)
BUN: 18 mg/dL (ref 7–25)
CO2: 32 mmol/L (ref 20–32)
Calcium: 9.7 mg/dL (ref 8.6–10.3)
Chloride: 101 mmol/L (ref 98–110)
Creat: 0.74 mg/dL (ref 0.70–1.35)
Globulin: 2.2 g/dL (ref 1.9–3.7)
Glucose, Bld: 102 mg/dL — ABNORMAL HIGH (ref 65–99)
Potassium: 4.8 mmol/L (ref 3.5–5.3)
Sodium: 140 mmol/L (ref 135–146)
Total Bilirubin: 0.4 mg/dL (ref 0.2–1.2)
Total Protein: 6.8 g/dL (ref 6.1–8.1)
eGFR: 102 mL/min/{1.73_m2} (ref >=60)

## 2023-08-23 LAB — HEMOGLOBIN A1C
Hgb A1c MFr Bld: 5.6 %{Hb} (ref <5.7)
Mean Plasma Glucose: 114 mg/dL
eAG (mmol/L): 6.3 mmol/L

## 2023-08-23 LAB — LIPID PANEL
Cholesterol: 108 mg/dL (ref <200)
HDL: 41 mg/dL (ref >=40)
LDL Cholesterol (Calc): 46 mg/dL
Non-HDL Cholesterol (Calc): 67 mg/dL (ref <130)
Total CHOL/HDL Ratio: 2.6 (calc) (ref <5.0)
Triglycerides: 125 mg/dL (ref <150)

## 2023-08-23 LAB — TSH: TSH: 1.29 m[IU]/L (ref 0.40–4.50)

## 2023-08-23 LAB — PSA: PSA: 0.65 ng/mL (ref <=4.00)

## 2023-08-25 NOTE — Telephone Encounter (Signed)
Attempted to call patient. No answer. No voicemail.

## 2023-08-26 DIAGNOSIS — Z0279 Encounter for issue of other medical certificate: Secondary | ICD-10-CM

## 2023-08-27 DIAGNOSIS — M170 Bilateral primary osteoarthritis of knee: Secondary | ICD-10-CM | POA: Diagnosis not present

## 2023-08-27 DIAGNOSIS — G894 Chronic pain syndrome: Secondary | ICD-10-CM | POA: Diagnosis not present

## 2023-08-27 MED ORDER — ATORVASTATIN CALCIUM 10 MG PO TABS: 10.0000 mg | ORAL_TABLET | Freq: Every day | ORAL | 1 refills | Status: DC

## 2023-08-27 NOTE — Telephone Encounter (Signed)
Pt is requesting a refill on his cholesterol  med[facility] [place]

## 2023-08-28 ENCOUNTER — Other Ambulatory Visit: Payer: Self-pay

## 2023-08-28 NOTE — Telephone Encounter (Signed)
Will make a separate encounter for medication refill as this encounter is related to a matter completely separate.

## 2023-08-28 NOTE — Telephone Encounter (Signed)
A user error has taken place: encounter opened in error, closed for administrative reasons.

## 2023-08-29 ENCOUNTER — Ambulatory Visit (INDEPENDENT_AMBULATORY_CARE_PROVIDER_SITE_OTHER): Payer: Medicare HMO | Admitting: Family Medicine

## 2023-08-29 ENCOUNTER — Encounter: Payer: Self-pay | Admitting: Family Medicine

## 2023-08-29 VITALS — BP 124/86 | HR 84 | Ht 72.0 in | Wt 193.0 lb

## 2023-08-29 DIAGNOSIS — Z0000 Encounter for general adult medical examination without abnormal findings: Secondary | ICD-10-CM | POA: Diagnosis not present

## 2023-08-29 DIAGNOSIS — Z1211 Encounter for screening for malignant neoplasm of colon: Secondary | ICD-10-CM | POA: Diagnosis not present

## 2023-08-29 DIAGNOSIS — E1140 Type 2 diabetes mellitus with diabetic neuropathy, unspecified: Secondary | ICD-10-CM | POA: Diagnosis not present

## 2023-08-29 MED ORDER — METFORMIN HCL 1000 MG PO TABS: ORAL_TABLET | ORAL | 3 refills | Status: DC

## 2023-08-29 NOTE — Progress Notes (Signed)
Subjective:    Patient ID: Dale [name]., male    DOB: 17-May-1961, 62 y.o.   MRN: [id number]  Dale [name]. is a 62 y.o. male presenting on 08/29/2023 for Annual Exam   HPI  Additional items today - Needs paperwork for Pharmacogenetics testing signed today - Recent update he has been cleared to proceed w/ plasma donation, paperwork submitted by fax earlier this week   Here for Annual Physical and Lab Review  Discussed the use of AI scribe software for clinical note transcription with the patient, who gave verbal consent to proceed.   CHRONIC HTN: BP lower on avg Followed by [facility] Dr Gwen Pounds previously. Current Meds - Lisinopril 10mg  daily, Propranolol 40mg  TID - OFF Amlodipine, HCTZ Reports good compliance, took meds today. Tolerating well, w/o complaints. Denies CP, dyspnea, HA, edema, dizziness / lightheadedness   Type 2 DM with peripheral neuropathy Hyperlipidemia T2DM Last A1c 5.6% (08/2023) Due for A1c On medications - Ozempic 1mg  weekly, Metformin 1000mg  BID CBGs improved 90-120s Lab reviewed On Statin Admits some abdominal pain at times. Constipation. Increased Gas production No more stomach acid Wt down 180 lbs, but up to 200 lbs.    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 He has had controlled actual stomach acid but has variety of other functional GI symptoms Question if related to GLP Ozempic Return to [facility] for colonoscopy as planned due 2025   DM Eye due [facility] - Dr Clearance Coots  Dentist picked up  some carotid artery plaque possibly R, defer doppler for today  BPH Difficulty with urinary stream and constellation of BPH symptoms Followed by Dr Lonna Cobb, upcoming apt. Urinary urgency / hesitancy On Flomax 0.8mg  max, consider options finasteride vs OAB possibility or procedural Apt 10/30   Health Maintenance:  PSA 0.65 negative     07/04/2023    9:30 AM 02/27/2023   10:02 AM 09/18/2022   11:08 AM  Depression screen PHQ 2/9  Decreased Interest 0 2 1  Down, Depressed, Hopeless 0 1 1  PHQ - 2 Score 0 3 2  Altered sleeping 0 0 0  Tired, decreased energy 0 1 2  Change in appetite 0 2 0  Feeling bad or failure about yourself  0 2 0  Trouble concentrating 0 1 0  Moving slowly or fidgety/restless 0 0 2  Suicidal thoughts 0 0 0  PHQ-9 Score 0 9 6  Difficult doing work/chores Not difficult at all  Somewhat difficult    Past Medical History:  Diagnosis Date   ADHD (attention deficit hyperactivity disorder)    Anxiety    Coronary artery disease    Coronary artery disease    Coronary atherosclerosis    Depression    GERD (gastroesophageal reflux disease)    Glaucoma    Headache    migrane   Heart disease    Hyperlipidemia    Hypertension    Hypertriglyceridemia    Hypogonadism in male    MVP (mitral valve prolapse)    Narcolepsy and cataplexy    Non-alcoholic fatty liver disease  Past Surgical History:  Procedure Laterality Date   COLONOSCOPY WITH PROPOFOL N/A 11/18/2018   Procedure: COLONOSCOPY WITH PROPOFOL;  Surgeon: Scot Jun, MD;  Location: [facility] ENDOSCOPY;  Service: Endoscopy;  Laterality: N/A;   ESOPHAGOGASTRODUODENOSCOPY (EGD) WITH PROPOFOL N/A 11/18/2018   Procedure: ESOPHAGOGASTRODUODENOSCOPY (EGD) WITH PROPOFOL;  Surgeon: Scot Jun, MD;  Location: [facility] ENDOSCOPY;  Service: Endoscopy;  Laterality: N/A;   pain pump implant and removal Bilateral    Dec. 21 2019   SPINE SURGERY     Multiple surgeries, initial 2001, thoracic disc repair and  recurrent revisions.   THROAT SURGERY     Social History   Socioeconomic History   Marital status: Married    Spouse name: Not on file   Number of children: Not on file   Years of education: 1 yr of college   Highest education level: Some college, no degree  Occupational History   Occupation: Disabled  Tobacco Use   Smoking status: Former    Current packs/day: 0.00    Types: Cigarettes    Start date: 08/29/1979    Quit date: 11/04/2010    Years since quitting: 12.8   Smokeless tobacco: Former    Quit date: 11/19/2010   Tobacco comments:    1 to 1.5 packs per day  Vaping Use   Vaping status: Never Used  Substance and Sexual Activity   Alcohol use: No    Alcohol/week: 0.0 standard drinks of alcohol   Drug use: No   Sexual activity: Not Currently  Other Topics Concern   Not on file  Social History Narrative   Not on file   Social Determinants of Health   Financial Resource Strain: Low Risk  (07/04/2023)   Overall Financial Resource Strain (CARDIA)    Difficulty of Paying Living Expenses: Not very hard  Food Insecurity: No Food Insecurity (07/04/2023)   Hunger Vital Sign    Worried About Running Out of Food in the Last Year: Never true    Ran Out of Food in the Last Year: Never true  Transportation Needs: No Transportation Needs (07/04/2023)   PRAPARE - Administrator, Civil Service (Medical): No    Lack of Transportation (Non-Medical): No  Physical Activity: Insufficiently Active (07/04/2023)   Exercise Vital Sign    Days of Exercise per Week: 3 days    Minutes of Exercise per Session: 20 min  Stress: No Stress Concern Present (07/04/2023)   [employer] of Occupational Health - Occupational Stress Questionnaire    Feeling of Stress : Only a little  Social Connections: Moderately Integrated (07/04/2023)   Social Connection and Isolation Panel [NHANES]    Frequency of Communication with Friends and Family: More than three times a week    Frequency of  Social Gatherings with Friends and Family: Never    Attends Religious Services: More than 4 times per year    Active Member of [employer] or Organizations: No    Attends Banker Meetings: Never    Marital Status: Married  Catering manager Violence: Not At Risk (07/04/2023)   Humiliation, Afraid, Rape, and Kick questionnaire    Fear of Current or Ex-Partner: No    Emotionally Abused: No    Physically Abused: No    Sexually Abused: No   Family History  Problem Relation Age of Onset   Depression Mother    Anxiety disorder Mother    Diabetes Mother    Heart failure Mother    Heart attack Father  Diabetes Sister    Obesity Sister    Drug abuse Brother    Alcohol abuse Brother    Depression Sister    Anxiety disorder Sister    Drug abuse Sister    Anxiety disorder Sister    Seizures Sister    Diabetes Sister    Depression Sister    Current Outpatient Medications on File Prior to Visit  Medication Sig   acetaminophen (TYLENOL) 500 MG tablet Take 500 mg by mouth 2 (two) times daily as needed.   ALPRAZolam (XANAX) 0.5 MG tablet Take 0.5 mg by mouth 2 (two) times daily as needed.   amphetamine-dextroamphetamine (ADDERALL) 10 MG tablet    aspirin EC 81 MG tablet Take 81 mg by mouth daily.   atorvastatin (LIPITOR) 10 MG tablet Take 1 tablet (10 mg total) by mouth daily.   b complex vitamins capsule Take by mouth.   Blood Glucose Monitoring Suppl (TRUE METRIX AIR GLUCOSE METER) w/Device KIT Check blood sugar 2 times daily   buPROPion (WELLBUTRIN XL) 150 MG 24 hr tablet Take 150 mg by mouth daily. (Patient not taking: Reported on 07/24/2023)   Cholecalciferol 25 MCG (1000 UT) TBDP Take 2 tablets by mouth daily.   CINNAMON PO Take by mouth.   Coenzyme Q10 100 MG TABS Take 100 mg by mouth daily.   cyanocobalamin 1000 MCG tablet Take 1 tablet by mouth daily.   desoximetasone (TOPICORT) 0.25 % cream Apply 1 Application topically 2 (two) times daily as needed.   diclofenac sodium  (VOLTAREN) 1 % GEL Apply topically.   dicyclomine (BENTYL) 20 MG tablet TAKE 1 TABLET (20 MG TOTAL) BY MOUTH 3 TIMES DAILY BEFORE MEALS. AS NEEDED FOR ABDOMINAL CRAMPING   DULoxetine (CYMBALTA) 60 MG capsule Take 60 mg by mouth 2 (two) times daily.   gabapentin (NEURONTIN) 300 MG capsule Take 3 capsules by mouth 3 (three) times daily.   Glucosamine-Chondroitin 250-200 MG TABS Take by mouth.   hydrOXYzine (ATARAX) 25 MG tablet Take 25 mg by mouth 2 (two) times daily as needed.   ibuprofen (ADVIL) 200 MG tablet Take 600 mg by mouth 2 (two) times daily as needed.   lidocaine (LIDODERM) 5 % SMARTSIG:Topical   Multiple Vitamin (MULTIVITAMIN) tablet Take 1 tablet by mouth daily.   Omega-3 Fatty Acids (FISH OIL) 1000 MG CAPS Take by mouth.   OZEMPIC, 1 MG/DOSE, 4 MG/3ML SOPN Inject 1 mg into the skin once a week.   pantoprazole (PROTONIX) 40 MG tablet Take 1 tablet (40 mg total) by mouth 2 (two) times daily before a meal.   propranolol (INDERAL) 40 MG tablet Take 1 tablet (40 mg total) by mouth 3 (three) times daily.   SHINGRIX injection Inject 0.5 mL into muscle for shingles vaccine. 2nd dose   tamsulosin (FLOMAX) 0.4 MG CAPS capsule Take 2 capsules (0.8 mg total) by mouth daily.   testosterone cypionate (DEPOTESTOSTERONE CYPIONATE) 200 MG/ML injection INJECT 1 ML (200 MG TOTAL) INTO THE MUSCLE EVERY 14 DAYS   tiZANidine (ZANAFLEX) 2 MG tablet Take 2 mg by mouth at bedtime as needed.   tiZANidine (ZANAFLEX) 4 MG tablet 1 tablet twice daily, May take an additional 1/2 tablet at bedtime as needed   traMADol (ULTRAM) 50 MG tablet Take 50 mg by mouth every 6 (six) hours as needed.   traZODone (DESYREL) 50 MG tablet Take 25 mg by mouth at bedtime as needed.   TRUE METRIX BLOOD GLUCOSE TEST test strip Check blood sugar 2 times a day  No current facility-administered medications on file prior to visit.    Review of Systems  Constitutional:  Negative for activity change, appetite change, chills,  diaphoresis, fatigue and fever.  HENT:  Negative for congestion and hearing loss.   Eyes:  Negative for visual disturbance.  Respiratory:  Negative for cough, chest tightness, shortness of breath and wheezing.   Cardiovascular:  Negative for chest pain, palpitations and leg swelling.  Gastrointestinal:  Negative for abdominal pain, constipation, diarrhea, nausea and vomiting.  Genitourinary:  Negative for dysuria, frequency and hematuria.  Musculoskeletal:  Negative for arthralgias and neck pain.  Skin:  Negative for rash.  Neurological:  Negative for dizziness, weakness, light-headedness, numbness and headaches.  Hematological:  Negative for adenopathy.  Psychiatric/Behavioral:  Negative for behavioral problems, dysphoric mood and sleep disturbance.    Per HPI unless specifically indicated above      Objective:    BP 124/86 (BP Location: Left Arm, Patient Position: Sitting, Cuff Size: Normal)   Pulse 84   Ht 6' (1.829 m)   Wt 193 lb (87.5 kg)   SpO2 96%   BMI 26.18 kg/m   Wt Readings from Last 3 Encounters:  08/29/23 193 lb (87.5 kg)  07/24/23 198 lb (89.8 kg)  07/04/23 203 lb 6.4 oz (92.3 kg)    Physical Exam Vitals and nursing note reviewed.  Constitutional:      General: He is not in acute distress.    Appearance: He is well-developed. He is not diaphoretic.     Comments: Well-appearing, comfortable, cooperative  HENT:     Head: Normocephalic and atraumatic.  Eyes:     General:        Right eye: No discharge.        Left eye: No discharge.     Conjunctiva/sclera: Conjunctivae normal.     Pupils: Pupils are equal, round, and reactive to light.  Neck:     Thyroid: No thyromegaly.  Cardiovascular:     Rate and Rhythm: Normal rate and regular rhythm.     Pulses: Normal pulses.     Heart sounds: Normal heart sounds. No murmur heard. Pulmonary:     Effort: Pulmonary effort is normal. No respiratory distress.     Breath sounds: Normal breath sounds. No wheezing or  rales.  Abdominal:     General: Bowel sounds are normal. There is no distension.     Palpations: Abdomen is soft. There is no mass.     Tenderness: There is no abdominal tenderness.  Musculoskeletal:        General: No tenderness. Normal range of motion.     Cervical back: Normal range of motion and neck supple.     Comments: Upper / Lower Extremities: - Normal muscle tone, strength bilateral upper extremities 5/5, lower extremities 5/5  Lymphadenopathy:     Cervical: No cervical adenopathy.  Skin:    General: Skin is warm and dry.     Findings: No erythema or rash.  Neurological:     Mental Status: He is alert and oriented to person, place, and time.     Comments: Distal sensation intact to light touch all extremities  Psychiatric:        Mood and Affect: Mood normal.        Behavior: Behavior normal.        Thought Content: Thought content normal.     Comments: Well groomed, good eye contact, normal speech and thoughts     Diabetic Foot Exam - Simple  Simple Foot Form Diabetic Foot exam was performed with the following findings: Yes 08/29/2023  2:53 PM  Visual Inspection See comments: Yes Sensation Testing See comments: Yes Pulse Check Posterior Tibialis and Dorsalis pulse intact bilaterally: Yes Comments LEFT dramatic reduced monofilament sensory testing. Has some intact sensation dorsal only. Bilateral callus formation great toes and forefoot  R Foot ulceration healing     Results for orders placed or performed in visit on 08/22/23  TSH  Result Value Ref Range   TSH 1.29 0.40 - 4.50 mIU/L  PSA  Result Value Ref Range   PSA 0.65 < OR = 4.00 ng/mL  Lipid panel  Result Value Ref Range   Cholesterol 108 <200 mg/dL   HDL 41 > OR = 40 mg/dL   Triglycerides 161 <096 mg/dL   LDL Cholesterol (Calc) 46 mg/dL (calc)   Total CHOL/HDL Ratio 2.6 <5.0 (calc)   Non-HDL Cholesterol (Calc) 67 <045 mg/dL (calc)  Hemoglobin W0J  Result Value Ref Range   Hgb A1c MFr Bld 5.6  <5.7 % of total Hgb   Mean Plasma Glucose 114 mg/dL   eAG (mmol/L) 6.3 mmol/L  CBC with Differential/Platelet  Result Value Ref Range   WBC 7.5 3.8 - 10.8 Thousand/uL   RBC 5.40 4.20 - 5.80 Million/uL   Hemoglobin 15.1 13.2 - 17.1 g/dL   HCT 81.1 91.4 - 78.2 %   MCV 88.3 80.0 - 100.0 fL   MCH 28.0 27.0 - 33.0 pg   MCHC 31.7 (L) 32.0 - 36.0 g/dL   RDW 95.6 21.3 - 08.6 %   Platelets 262 140 - 400 Thousand/uL   MPV 10.7 7.5 - 12.5 fL   Neutro Abs 4,358 1,500 - 7,800 cells/uL   Absolute Lymphocytes 2,100 850 - 3,900 cells/uL   Absolute Monocytes 870 200 - 950 cells/uL   Eosinophils Absolute 143 15 - 500 cells/uL   Basophils Absolute 30 0 - 200 cells/uL   Neutrophils Relative % 58.1 %   Total Lymphocyte 28.0 %   Monocytes Relative 11.6 %   Eosinophils Relative 1.9 %   Basophils Relative 0.4 %  COMPLETE METABOLIC PANEL WITH GFR  Result Value Ref Range   Glucose, Bld 102 (H) 65 - 99 mg/dL   BUN 18 7 - 25 mg/dL   Creat 5.78 4.69 - 6.29 mg/dL   eGFR 528 > OR = 60 UX/LKG/4.01U2   BUN/Creatinine Ratio SEE NOTE: 6 - 22 (calc)   Sodium 140 135 - 146 mmol/L   Potassium 4.8 3.5 - 5.3 mmol/L   Chloride 101 98 - 110 mmol/L   CO2 32 20 - 32 mmol/L   Calcium 9.7 8.6 - 10.3 mg/dL   Total Protein 6.8 6.1 - 8.1 g/dL   Albumin 4.6 3.6 - 5.1 g/dL   Globulin 2.2 1.9 - 3.7 g/dL (calc)   AG Ratio 2.1 1.0 - 2.5 (calc)   Total Bilirubin 0.4 0.2 - 1.2 mg/dL   Alkaline phosphatase (APISO) 82 35 - 144 U/L   AST 17 10 - 35 U/L   ALT 13 9 - 46 U/L      Assessment & Plan:   Problem List Items Addressed This Visit     Type 2 diabetes, controlled, with neuropathy (HCC)   Relevant Medications   metFORMIN (GLUCOPHAGE) 1000 MG tablet   Other Relevant Orders   Urine Microalbumin w/creat. ratio   Other Visit Diagnoses     Annual physical exam    -  Primary   Screening for colon  cancer       Relevant Orders   Ambulatory referral to Gastroenterology      Updated Health Maintenance  information Reviewed recent lab results with patient Encouraged improvement to lifestyle with diet and exercise Goal of weight loss     Benign Prostatic Hyperplasia Urinary urgency and hesitancy despite maximum dose of Flomax. Discussed options including switching Flomax to a different version, starting finasteride, considering bladder spasm medication, or a procedure. Return to Urology as scheduled soon, will route chart for review.  Pharmacogenetics -Submit pharmacogenetics orders  Diabetes Mellitus Well controlled with Metformin, A1C 5.6. -Refill Metformin 90-day supply at [facility].  General Health Maintenance -Colonoscopy due in January 2025, refer to [facility]  Carotid Arterial Plaque Identified on Dental X-ray Discussed potential for ultrasound Doppler, but deferred for now due to patient's good control of risk factors. -Continue current management and monitor.        Orders Placed This Encounter  Procedures   Urine Microalbumin w/creat. ratio   Ambulatory referral to Gastroenterology    Referral Priority:   Routine    Referral Type:   Consultation    Referral Reason:   Specialty Services Required    Number of Visits Requested:   1       Meds ordered this encounter  Medications   metFORMIN (GLUCOPHAGE) 1000 MG tablet    Sig: TAKE 1 TABLET (1,000 MG TOTAL) BY MOUTH TWICE A DAY WITH FOOD    Dispense:  180 tablet    Refill:  3   Route chart to Dr Lonna Cobb about the prostate Urinary urgency / hesitancy On Flomax 0.8mg  max, consider options finasteride vs OAB possibility or procedural Apt 10/30   Follow up plan: Return in about 6 months (around 02/27/2024) for 6 month DM A1c.  [name], DO [facility] 08/29/2023, 2:43 PM

## 2023-08-29 NOTE — Telephone Encounter (Signed)
After office visit today. Order form signed and ready to be faxed.  [name], DO [facility] 08/29/2023, 5:32 PM

## 2023-08-29 NOTE — Patient Instructions (Addendum)
Thank you for coming to the office today.  I will route your chart to Dr Lonna Cobb Options include - Switch Flomax to different version -Start Finasteride hormone medicine for shrinking prostate (4-6 month) -Consider bladder spasm medication option -Procedure  Already completed the Plasma donation forms  We will fax the Pharmacogenetics orders today.  Refilled Metformin  Referral to [facility] for Colonoscopy in January 2025  Recent Labs    02/27/23 1033 08/22/23 0931  HGBA1C 5.3 5.6     Please schedule a Follow-up Appointment to: Return in about 6 months (around 02/27/2024) for 6 month DM A1c.  If you have any other questions or concerns, please feel free to call the office or send a message through MyChart. You may also schedule an earlier appointment if necessary.  Additionally, you may be receiving a survey about your experience at our office within a few days to 1 week by e-mail or mail. We value your feedback.  [name], DO [facility], [place]

## 2023-08-30 LAB — MICROALBUMIN / CREATININE URINE RATIO
Creatinine, Urine: 132 mg/dL (ref 20–320)
Microalb Creat Ratio: 11 mg/g{creat} (ref <30)
Microalb, Ur: 1.4 mg/dL

## 2023-09-03 ENCOUNTER — Ambulatory Visit (INDEPENDENT_AMBULATORY_CARE_PROVIDER_SITE_OTHER): Payer: Medicare PPO | Admitting: Urology

## 2023-09-03 ENCOUNTER — Encounter: Payer: Self-pay | Admitting: Urology

## 2023-09-03 VITALS — BP 112/76 | HR 84 | Ht 71.0 in | Wt 192.0 lb

## 2023-09-03 DIAGNOSIS — E291 Testicular hypofunction: Secondary | ICD-10-CM

## 2023-09-03 DIAGNOSIS — N401 Enlarged prostate with lower urinary tract symptoms: Secondary | ICD-10-CM

## 2023-09-03 DIAGNOSIS — Z125 Encounter for screening for malignant neoplasm of prostate: Secondary | ICD-10-CM

## 2023-09-03 DIAGNOSIS — M542 Cervicalgia: Secondary | ICD-10-CM | POA: Diagnosis not present

## 2023-09-03 DIAGNOSIS — R3911 Hesitancy of micturition: Secondary | ICD-10-CM

## 2023-09-03 LAB — BLADDER SCAN AMB NON-IMAGING: Scan Result: 38

## 2023-09-03 MED ORDER — DUTASTERIDE 0.5 MG PO CAPS: 0.5000 mg | ORAL_CAPSULE | Freq: Every day | ORAL | 1 refills | Status: DC

## 2023-09-04 DIAGNOSIS — F1190 Opioid use, unspecified, uncomplicated: Secondary | ICD-10-CM | POA: Diagnosis not present

## 2023-09-04 DIAGNOSIS — G894 Chronic pain syndrome: Secondary | ICD-10-CM | POA: Diagnosis not present

## 2023-09-04 DIAGNOSIS — M542 Cervicalgia: Secondary | ICD-10-CM | POA: Diagnosis not present

## 2023-09-04 DIAGNOSIS — M961 Postlaminectomy syndrome, not elsewhere classified: Secondary | ICD-10-CM | POA: Diagnosis not present

## 2023-09-05 ENCOUNTER — Encounter: Payer: Self-pay | Admitting: Urology

## 2023-09-05 DIAGNOSIS — I10 Essential (primary) hypertension: Secondary | ICD-10-CM | POA: Diagnosis not present

## 2023-09-05 DIAGNOSIS — K2100 Gastro-esophageal reflux disease with esophagitis, without bleeding: Secondary | ICD-10-CM | POA: Diagnosis not present

## 2023-09-05 DIAGNOSIS — F332 Major depressive disorder, recurrent severe without psychotic features: Secondary | ICD-10-CM | POA: Diagnosis not present

## 2023-09-05 DIAGNOSIS — F411 Generalized anxiety disorder: Secondary | ICD-10-CM | POA: Diagnosis not present

## 2023-09-10 ENCOUNTER — Encounter: Payer: Self-pay | Admitting: Family Medicine

## 2023-09-10 DIAGNOSIS — N529 Male erectile dysfunction, unspecified: Secondary | ICD-10-CM

## 2023-09-11 MED ORDER — SILDENAFIL CITRATE 100 MG PO TABS
100.0000 mg | ORAL_TABLET | Freq: Every day | ORAL | 3 refills | Status: AC | PRN
Start: 2023-09-11 — End: ?

## 2023-09-14 ENCOUNTER — Other Ambulatory Visit: Payer: Self-pay | Admitting: Internal Medicine

## 2023-09-14 DIAGNOSIS — R109 Unspecified abdominal pain: Secondary | ICD-10-CM

## 2023-09-15 DIAGNOSIS — E1151 Type 2 diabetes mellitus with diabetic peripheral angiopathy without gangrene: Secondary | ICD-10-CM | POA: Diagnosis not present

## 2023-09-15 DIAGNOSIS — I739 Peripheral vascular disease, unspecified: Secondary | ICD-10-CM | POA: Diagnosis not present

## 2023-09-15 DIAGNOSIS — L97519 Non-pressure chronic ulcer of other part of right foot with unspecified severity: Secondary | ICD-10-CM | POA: Diagnosis not present

## 2023-09-15 DIAGNOSIS — E11621 Type 2 diabetes mellitus with foot ulcer: Secondary | ICD-10-CM | POA: Diagnosis not present

## 2023-09-15 NOTE — Telephone Encounter (Signed)
Requested Prescriptions  Pending Prescriptions Disp Refills   dicyclomine (BENTYL) 20 MG tablet [Pharmacy Med Name: DICYCLOMINE 20 MG TABLET] 270 tablet 3    Sig: TAKE 1 TABLET (20 MG TOTAL) BY MOUTH 3 TIMES DAILY BEFORE MEALS. AS NEEDED FOR ABDOMINAL CRAMPING     Gastroenterology:  Antispasmodic Agents Passed - 09/14/2023  8:51 AM      Passed - Valid encounter within last 12 months    Recent Outpatient Visits           2 weeks ago Annual physical exam   [facility], Netta Neat, DO   1 month ago Diabetic ulcer of toe of right foot associated with type 2 diabetes mellitus, with fat layer exposed [facility])   [facility] [place], Salvadore Oxford, NP   2 months ago Essential (primary) hypertension   [facility] D[place], Gentry Fitz A, RPH-CPP   3 months ago Essential (primary) hypertension   [facility] [facility] Delles, Gentry Fitz A, RPH-CPP   5 months ago Hyperlipidemia associated with type 2 diabetes mellitus [facility])    [facility] Delles, Jackelyn Poling, RPH-CPP       Future Appointments             In 11 months Stoioff, Verna Czech, MD [facility]

## 2023-09-18 DIAGNOSIS — M79671 Pain in right foot: Secondary | ICD-10-CM | POA: Diagnosis not present

## 2023-09-18 DIAGNOSIS — L97511 Non-pressure chronic ulcer of other part of right foot limited to breakdown of skin: Secondary | ICD-10-CM | POA: Diagnosis not present

## 2023-09-18 DIAGNOSIS — E08621 Diabetes mellitus due to underlying condition with foot ulcer: Secondary | ICD-10-CM | POA: Diagnosis not present

## 2023-09-18 DIAGNOSIS — M2021 Hallux rigidus, right foot: Secondary | ICD-10-CM | POA: Diagnosis not present

## 2023-09-18 DIAGNOSIS — L97519 Non-pressure chronic ulcer of other part of right foot with unspecified severity: Secondary | ICD-10-CM | POA: Diagnosis not present

## 2023-09-18 DIAGNOSIS — G8929 Other chronic pain: Secondary | ICD-10-CM | POA: Diagnosis not present

## 2023-09-18 DIAGNOSIS — M25561 Pain in right knee: Secondary | ICD-10-CM | POA: Diagnosis not present

## 2023-09-22 DIAGNOSIS — M4802 Spinal stenosis, cervical region: Secondary | ICD-10-CM | POA: Diagnosis not present

## 2023-09-22 DIAGNOSIS — M4723 Other spondylosis with radiculopathy, cervicothoracic region: Secondary | ICD-10-CM | POA: Diagnosis not present

## 2023-09-22 DIAGNOSIS — M5010 Cervical disc disorder with radiculopathy, unspecified cervical region: Secondary | ICD-10-CM | POA: Diagnosis not present

## 2023-09-22 DIAGNOSIS — M1711 Unilateral primary osteoarthritis, right knee: Secondary | ICD-10-CM | POA: Diagnosis not present

## 2023-09-22 DIAGNOSIS — G8929 Other chronic pain: Secondary | ICD-10-CM | POA: Diagnosis not present

## 2023-09-22 DIAGNOSIS — R609 Edema, unspecified: Secondary | ICD-10-CM | POA: Diagnosis not present

## 2023-09-22 DIAGNOSIS — M4722 Other spondylosis with radiculopathy, cervical region: Secondary | ICD-10-CM | POA: Diagnosis not present

## 2023-09-23 DIAGNOSIS — M5412 Radiculopathy, cervical region: Secondary | ICD-10-CM | POA: Diagnosis not present

## 2023-09-23 DIAGNOSIS — F339 Major depressive disorder, recurrent, unspecified: Secondary | ICD-10-CM | POA: Diagnosis not present

## 2023-09-29 ENCOUNTER — Other Ambulatory Visit: Payer: Self-pay | Admitting: Urology

## 2023-10-03 ENCOUNTER — Other Ambulatory Visit: Payer: Self-pay | Admitting: Urology

## 2023-10-03 DIAGNOSIS — N401 Enlarged prostate with lower urinary tract symptoms: Secondary | ICD-10-CM

## 2023-10-06 ENCOUNTER — Encounter: Payer: Self-pay | Admitting: Pharmacist

## 2023-10-06 ENCOUNTER — Other Ambulatory Visit: Payer: Medicare PPO | Admitting: Pharmacist

## 2023-10-06 DIAGNOSIS — Z1211 Encounter for screening for malignant neoplasm of colon: Secondary | ICD-10-CM | POA: Diagnosis not present

## 2023-10-06 DIAGNOSIS — Z01818 Encounter for other preprocedural examination: Secondary | ICD-10-CM | POA: Diagnosis not present

## 2023-10-06 NOTE — Patient Instructions (Signed)
Goals Addressed             This Visit's Progress    Pharmacy Goals       Please follow up with the patient assistance program for Ozempic for refills by calling:   [employer] at [phone]   Please watch the mail for an envelope from [employer] containing the patient assistance program application. Please complete this application and mail back to [facility] Pharmacy Technician Noreene Larsson [place] along with a copy of your Medicare Part D prescription card and a copy of your proof of income document OR you can bring these documents to the office to have them faxed back to Attention: [name] at Fax # [phone]   If you need to call Noreene Larsson, you can reach her at [phone]  Our goal A1c is less than 7%. This corresponds with fasting sugars less than 130 and 2 hour after meal sugars less than 180. Please check your blood sugar and keep a log of the results  Our goal bad cholesterol, or LDL, is less than 70 . This is why it is important to continue taking your atorvastatin  Check your blood pressure once daily, and any time you have concerning symptoms like headache, chest pain, dizziness, shortness of breath, or vision changes.   Our goal is less than 130/80.  To appropriately check your blood pressure, make sure you do the following:  1) Avoid caffeine, exercise, or tobacco products for 30 minutes before checking. Empty your bladder. 2) Sit with your back supported in a flat-backed chair. Rest your arm on something flat (arm of the chair, table, etc). 3) Sit still with your feet flat on the floor, resting, for at least 5 minutes.  4) Check your blood pressure. Take 1-2 readings.  5) Write down these readings and bring with you to any provider appointments.  Bring your home blood pressure machine with you to a provider's office for accuracy comparison at least once a year.   Make sure you take your blood pressure medications before you come to any office visit, even if you  were asked to fast for labs.   Feel free to call me with any questions or concerns. I look forward to our next call!   Estelle Grumbles, PharmD, P[place]y [place], CPP Clinical Pharmacist [facility] [phone]

## 2023-10-06 NOTE — Progress Notes (Signed)
10/06/2023 Name: Dale [name]. MRN: [id number] DOB: 1961/10/27  Chief Complaint  Patient presents with   Medication Assistance   Medication Management    Dale [name]. is a 62 y.o. year old male who was referred to the pharmacist by their PCP for assistance in managing diabetes, hypertension, hyperlipidemia, and medication access.    Appears previously scheduled follow up appointment was canceled via MyChart. However, outreach to patient today to see if he would like assistance with re-enrollment in patient assistance program. Patient requests assistance with re-enrollment in this program for Ozempic for 2025 calendar year.   Subjective:   Care Team: Primary Care Provider: Smitty Cords, DO ; Next Scheduled Visit: 02/27/2024 Neurologist: Jolene Provost, MD Psychiatrist: Maralyn Sago, MD Pain Management: [facility]; Next Scheduled Visit: 12/04/2023 Orthopedics: [facility] Urology: Riki Altes, MD Gastroenterology: [facility] Cardiology: Olena Heckle, MD   Medication Access/Adherence  Current Pharmacy:  [facility] [phone] [facility], [place] - [address] [place] [address] Phone: [phone] Fax: [phone]  [facility] - [place], [place] - [address] [place] [address] Phone: [phone] Fax: [phone]   Patient reports affordability concerns with their medications: No  Patient reports access/transportation concerns to their pharmacy: No  Patient reports adherence concerns with their medications:  No     Diabetes:   Current medications:  - metformin 1000 mg twice daily - Ozempic 1 mg weekly   Current glucose readings: reports recent morning fasting readings ranging 79-109   Denies symptoms of hypolycemia   Current physical activity: Limited by recent foot surgeries, but now able to walk again   Statin: atorvastatin 10 mg daily    Current medication access support: Enrolled in patient assistance for Ozempic through [employer] through 11/04/2023 - Reports currently has ~ 1 month supply of Ozempic remaining  Hypertension:   Current medications:  - Propranolol IR 40mg  three times daily   Medications previously tried: HCTZ (stopped due to hypotension); lisinopril (hypotension)   Patient has an automated, upper arm home BP cuff  Recalls last checked a few days ago, but did not record the reading   Denies symptoms of hypotension, such as dizziness or lightheadedness      Objective:  Lab Results  Component Value Date   HGBA1C 5.6 08/22/2023    Lab Results  Component Value Date   CREATININE 0.74 08/22/2023   BUN 18 08/22/2023   NA 140 08/22/2023   K 4.8 08/22/2023   CL 101 08/22/2023   CO2 32 08/22/2023    Lab Results  Component Value Date   CHOL 108 08/22/2023   HDL 41 08/22/2023   LDLCALC 46 08/22/2023   TRIG 125 08/22/2023   CHOLHDL 2.6 08/22/2023   BP Readings from Last 3 Encounters:  09/03/23 112/76  08/29/23 124/86  07/24/23 134/82   Pulse Readings from Last 3 Encounters:  09/03/23 84  08/29/23 84  07/24/23 73    Current Outpatient Medications on File Prior to Visit  Medication Sig Dispense Refill   acetaminophen (TYLENOL) 500 MG tablet Take 500 mg by mouth 2 (two) times daily as needed.     ALPRAZolam (XANAX) 0.5 MG tablet Take 0.5 mg by mouth 2 (two) times daily as needed.     amphetamine-dextroamphetamine (ADDERALL) 10 MG tablet      aspirin EC 81 MG tablet Take 81 mg by mouth daily.  atorvastatin (LIPITOR) 10 MG tablet Take 1 tablet (10 mg total) by mouth daily. 90 tablet 1   b complex vitamins capsule Take by mouth.     Blood Glucose Monitoring Suppl (TRUE METRIX AIR GLUCOSE METER) w/Device KIT Check blood sugar 2 times daily 1 kit 0   buPROPion (WELLBUTRIN XL) 150 MG 24 hr tablet Take 150 mg by mouth daily.     Cholecalciferol 25 MCG (1000 UT) TBDP Take 2 tablets by  mouth daily.     CINNAMON PO Take by mouth.     Coenzyme Q10 100 MG TABS Take 100 mg by mouth daily.     cyanocobalamin 1000 MCG tablet Take 1 tablet by mouth daily.     desoximetasone (TOPICORT) 0.25 % cream Apply 1 Application topically 2 (two) times daily as needed. 60 g 3   diclofenac sodium (VOLTAREN) 1 % GEL Apply topically.     dicyclomine (BENTYL) 20 MG tablet TAKE 1 TABLET (20 MG TOTAL) BY MOUTH 3 TIMES DAILY BEFORE MEALS. AS NEEDED FOR ABDOMINAL CRAMPING 270 tablet 3   DULoxetine (CYMBALTA) 60 MG capsule Take 60 mg by mouth 2 (two) times daily.     dutasteride (AVODART) 0.5 MG capsule Take 1 capsule (0.5 mg total) by mouth daily. 90 capsule 1   gabapentin (NEURONTIN) 300 MG capsule Take 3 capsules by mouth 3 (three) times daily.     Glucosamine-Chondroitin 250-200 MG TABS Take by mouth.     hydrOXYzine (ATARAX) 25 MG tablet Take 25 mg by mouth 2 (two) times daily as needed.     ibuprofen (ADVIL) 200 MG tablet Take 600 mg by mouth 2 (two) times daily as needed.     lidocaine (LIDODERM) 5 % SMARTSIG:Topical     metFORMIN (GLUCOPHAGE) 1000 MG tablet TAKE 1 TABLET (1,000 MG TOTAL) BY MOUTH TWICE A DAY WITH FOOD 180 tablet 3   Multiple Vitamin (MULTIVITAMIN) tablet Take 1 tablet by mouth daily.     Omega-3 Fatty Acids (FISH OIL) 1000 MG CAPS Take by mouth.     OZEMPIC, 1 MG/DOSE, 4 MG/3ML SOPN Inject 1 mg into the skin once a week. 9 mL 1   pantoprazole (PROTONIX) 40 MG tablet Take 1 tablet (40 mg total) by mouth 2 (two) times daily before a meal. 180 tablet 3   propranolol (INDERAL) 40 MG tablet Take 1 tablet (40 mg total) by mouth 3 (three) times daily. 270 tablet 0   SHINGRIX injection Inject 0.5 mL into muscle for shingles vaccine. 2nd dose 0.5 mL 0   sildenafil (VIAGRA) 100 MG tablet Take 1 tablet (100 mg total) by mouth daily as needed for erectile dysfunction. 100 tablet 3   tamsulosin (FLOMAX) 0.4 MG CAPS capsule TAKE 2 CAPSULES BY MOUTH EVERY DAY 180 capsule 1   testosterone  cypionate (DEPOTESTOSTERONE CYPIONATE) 200 MG/ML injection INJECT 1 ML (200 MG TOTAL) INTO THE MUSCLE EVERY 14 DAYS 2 mL 3   tiZANidine (ZANAFLEX) 2 MG tablet Take 2 mg by mouth at bedtime as needed.     tiZANidine (ZANAFLEX) 4 MG tablet 1 tablet twice daily, May take an additional 1/2 tablet at bedtime as needed     traMADol (ULTRAM) 50 MG tablet Take 50 mg by mouth every 6 (six) hours as needed.     traZODone (DESYREL) 50 MG tablet Take 25 mg by mouth at bedtime as needed.     TRUE METRIX BLOOD GLUCOSE TEST test strip Check blood sugar 2 times a day 200 each 3  No current facility-administered medications on file prior to visit.       Assessment/Plan:   Diabetes: - Currently controlled - Recommend to continue to monitor home blood sugar and to contact office if needed for readings outside of established parameters or new symptoms - Patient to follow up with [employer] patient assistance program as needed for refills of Ozempic - Will collaborate with PCP and CPhT as requested for aid to patient with applying for re-enrollment in patient assistance program for [employer] from [employer] for 2025     Hypertension: - Patient to continue home BP monitoring, keep log of results, have record to review at medical appointments and call providers for readings outside of established parameters, dizziness or new symptoms       Follow Up Plan: Clinical Pharmacist to follow up with patient by telephone again on 11/24/2023 at 2:00 PM    Estelle Grumbles, PharmD, Patsy Baltimore, CPP Clinical Pharmacist [facility] [phone]

## 2023-10-09 ENCOUNTER — Other Ambulatory Visit: Payer: Self-pay | Admitting: Pharmacy Technician

## 2023-10-09 DIAGNOSIS — Z5986 Financial insecurity: Secondary | ICD-10-CM

## 2023-10-09 NOTE — Progress Notes (Addendum)
 Pharmacy Medication Assistance Program Note    10/09/2023  Patient ID: Dale Volkert., male   DOB: 03-Jul-1961, 62 y.o.   MRN: [id number]     10/09/2023  Outreach [employer]ication One  Initial Outreach Date (Medication One) 10/08/2023  Manufacturer Medication One [employer] [facility] Ozempic  Dose of Ozempic 4mg /62ml  Type of Radiographer, therapeutic Assistance  Date Application Sent to Patient 10/08/2023  Application Items Requested Application;Proof of Income;Other  Date Application Sent to Prescriber 10/15/2023  Name of Prescriber [name]     [facility] 12/16/2023 Unsuccessful outreach to patient in regard to [employer] application for [employer]. HIPAA compliant v/m was left requesting a return call  Was calling to inquire if patient received the application that was mailed to him on 10/08/23.   ADDENDUM 12/26/2023 Second unsuccessful outreach to patient in regard to [employer] application for [employer]. HIPAA compliant v/m was left requesting a return call  Was calling to inquire if patient received the application that was mailed to him on 10/08/23  ADDENDUM 01/02/2024 Third unsuccessful outreach to patient in regard to [employer] application for [employer]. HIPAA compliant v/m was left requesting a return call  Was calling to inquire if patient received the application that was mailed to him on 10/08/23. Will close case at this time but will gladly reopen the case if patient returns the calls or mails back the applications.  [name], CPhT [place]  Office: [phone] Fax: [phone] Email: [name]

## 2023-10-16 DIAGNOSIS — E11621 Type 2 diabetes mellitus with foot ulcer: Secondary | ICD-10-CM | POA: Diagnosis not present

## 2023-10-16 DIAGNOSIS — M2021 Hallux rigidus, right foot: Secondary | ICD-10-CM | POA: Diagnosis not present

## 2023-10-16 DIAGNOSIS — L97511 Non-pressure chronic ulcer of other part of right foot limited to breakdown of skin: Secondary | ICD-10-CM | POA: Diagnosis not present

## 2023-10-16 DIAGNOSIS — L97519 Non-pressure chronic ulcer of other part of right foot with unspecified severity: Secondary | ICD-10-CM | POA: Diagnosis not present

## 2023-10-16 DIAGNOSIS — M79671 Pain in right foot: Secondary | ICD-10-CM | POA: Diagnosis not present

## 2023-10-16 DIAGNOSIS — R5381 Other malaise: Secondary | ICD-10-CM | POA: Diagnosis not present

## 2023-10-23 ENCOUNTER — Encounter: Payer: Medicare PPO | Attending: Physician Assistant | Admitting: Physician Assistant

## 2023-10-23 DIAGNOSIS — L97512 Non-pressure chronic ulcer of other part of right foot with fat layer exposed: Secondary | ICD-10-CM | POA: Diagnosis not present

## 2023-10-23 DIAGNOSIS — I10 Essential (primary) hypertension: Secondary | ICD-10-CM | POA: Diagnosis not present

## 2023-10-23 DIAGNOSIS — L97513 Non-pressure chronic ulcer of other part of right foot with necrosis of muscle: Secondary | ICD-10-CM | POA: Diagnosis not present

## 2023-10-23 DIAGNOSIS — E1143 Type 2 diabetes mellitus with diabetic autonomic (poly)neuropathy: Secondary | ICD-10-CM | POA: Diagnosis not present

## 2023-10-23 DIAGNOSIS — E11621 Type 2 diabetes mellitus with foot ulcer: Secondary | ICD-10-CM | POA: Diagnosis not present

## 2023-10-23 DIAGNOSIS — Z87891 Personal history of nicotine dependence: Secondary | ICD-10-CM | POA: Insufficient documentation
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
## 2023-10-24 NOTE — Progress Notes (Signed)
ALIKA, KRISKO ([id number]) [phone] [email or web address] Page 1 of 5 Visit Report for 10/23/2023 Abuse Risk Screen Details Patient Name: Date of Service: Dale [name], Dale [name] 10/23/2023 8:15 A M Medical Record Number: [id number] Patient Account Number: [id number] Date of Birth/Sex: Treating RN: [birth year] (62 y.o. Male) Angelina Pih Primary Care Bob Daversa: [name] Other Clinician: Referring Lynnley Doddridge: Treating Earnie Bechard/Extender: Tiburcio Pea in Treatment: 0 Abuse Risk Screen Items Answer ABUSE RISK SCREEN: Has anyone close to you tried to hurt or harm you recentlyo No Do you feel uncomfortable with anyone in your familyo No Has anyone forced you do things that you didnt want to doo No Electronic Signature(s) Signed: 10/23/2023 4:27:20 PM By: Angelina Pih Entered By: Angelina Pih on 10/23/2023 05:36:29 -------------------------------------------------------------------------------- Activities of Daily Living Details Patient Name: Date of Service: Dale [name], Dale [name] 10/23/2023 8:15 A M Medical Record Number: [id number] Patient Account Number: [id number] Date of Birth/Sex: Treating RN: 03-08-1961 (62 y.o. Male) Angelina Pih Primary Care Raiford Fetterman: [name] Other Clinician: Referring Nanette Wirsing: Treating Rambo Sarafian/Extender: Tiburcio Pea in Treatment: 0 Activities of Daily Living Items Answer Activities of Daily Living (Please select one for each item) Drive Automobile Completely Able T Medications ake Completely Able Use T elephone Completely Able Care for Appearance Completely Able Use T oilet Completely Able Bath / Shower Completely Able Dress Self Completely Able Feed Self Completely Able Walk Completely Able Get In / Out Bed Completely Able Housework Completely Dale [name], Dale [name] ([id number]) [phone] [email or web address] Page 2 of 5 Prepare  Meals Completely Able Handle Money Completely Able Shop for Self Completely Able Electronic Signature(s) Signed: 10/23/2023 4:27:20 PM By: Angelina Pih Entered By: Angelina Pih on 10/23/2023 05:36:49 -------------------------------------------------------------------------------- Education Screening Details Patient Name: Date of Service: Dale [name] 10/23/2023 8:15 A M Medical Record Number: [id number] Patient Account Number: [id number] Date of Birth/Sex: Treating RN: 09-Feb-1961 (62 y.o. Male) Angelina Pih Primary Care Vyom Brass: [name] Other Clinician: Referring Riko Lumsden: Treating Yousra Ivens/Extender: Tiburcio Pea in Treatment: 0 Primary Learner Assessed: Patient Learning Preferences/Education Level/Primary Language Learning Preference: Explanation, Demonstration, Video, Communication Board, Printed Material Preferred Language: English Cognitive Barrier Language Barrier: No Translator Needed: No Memory Deficit: No Emotional Barrier: No Cultural/Religious Beliefs Affecting Medical Care: No Physical Barrier Impaired Vision: Yes Glasses Impaired Hearing: No Decreased Hand dexterity: No Knowledge/Comprehension Knowledge Level: High Comprehension Level: High Ability to understand written instructions: High Ability to understand verbal instructions: High Motivation Anxiety Level: Calm Cooperation: Cooperative Education Importance: Acknowledges Need Interest in Health Problems: Asks Questions Perception: Coherent Willingness to Engage in Self-Management High Activities: Readiness to Engage in Self-Management High Activities: Electronic Signature(s) Signed: 10/23/2023 4:27:20 PM By: Angelina Pih Entered By: Angelina Pih on 10/23/2023 05:37:07 Dale [name] ([id number]) [phone] [email or web address] Page 3 of  5 -------------------------------------------------------------------------------- Fall Risk Assessment Details Patient Name: Date of Service: Dale [name], Dale [name] 10/23/2023 8:15 A M Medical Record Number: [id number] Patient Account Number: [id number] Date of Birth/Sex: Treating RN: April 21, 1961 (62 y.o. Male) Angelina Pih Primary Care Collins Dimaria: [name] Other Clinician: Referring Yordan Martindale: Treating Dondi Burandt/Extender: Tiburcio Pea in Treatment: 0 Fall Risk Assessment Items Have you had 2 or more falls in the last 12 monthso 0 No Have you had any fall that resulted in injury in the last 12 monthso 0 No FALLS RISK SCREEN History of falling - immediate or within 3 months 0 No Secondary diagnosis (Do you have 2 or more medical diagnoseso) 0 No Ambulatory aid None/bed rest/wheelchair/nurse 0 Yes  Crutches/cane/walker 0 No Furniture 0 No Intravenous therapy Access/Saline/Heparin Lock 0 No Gait/Transferring Normal/ bed rest/ wheelchair 0 Yes Weak (short steps with or without shuffle, stooped but able to lift head while walking, may seek 0 No support from furniture) Impaired (short steps with shuffle, may have difficulty arising from chair, head down, impaired 0 No balance) Mental Status Oriented to own ability 0 Yes Electronic Signature(s) Signed: 10/23/2023 4:27:20 PM By: Angelina Pih Entered By: Angelina Pih on 10/23/2023 05:38:05 -------------------------------------------------------------------------------- Foot Assessment Details Patient Name: Date of Service: Dale [name] 10/23/2023 8:15 A M Medical Record Number: [id number] Patient Account Number: [id number] Date of Birth/Sex: Treating RN: [birth year] (62 y.o. Male) Angelina Pih Primary Care Jeanifer Halliday: [name] Other Clinician: Referring Raylee Adamec: Treating Daena Alper/Extender: Tiburcio Pea in Treatment: 0 Foot Assessment  Items Site Locations [place] ([id number]) [phone] [id number] Page 4 of 5 + = Sensation present, - = Sensation absent, C = Callus, U = Ulcer R = Redness, W = Warmth, M = Maceration, PU = Pre-ulcerative lesion F = Fissure, S = Swelling, D = Dryness Assessment Right: Left: Other Deformity: No No Prior Foot Ulcer: No No Prior Amputation: No No Charcot Joint: No No Ambulatory Status: Ambulatory Without Help Gait: Steady Electronic Signature(s) Signed: 10/23/2023 4:27:20 PM By: Angelina Pih Entered By: Angelina Pih on 10/23/2023 05:45:27 -------------------------------------------------------------------------------- Nutrition Risk Screening Details Patient Name: Date of Service: Dale [name], Dale [name] 10/23/2023 8:15 A M Medical Record Number: [id number] Patient Account Number: [id number] Date of Birth/Sex: Treating RN: [birth year] (62 y.o. Male) Angelina Pih Primary Care Kiki Bivens: [name] Other Clinician: Referring Mery Guadalupe: Treating Prince Couey/Extender: Tiburcio Pea in Treatment: 0 Height (in): 72 Weight (lbs): 182 Body Mass Index (BMI): 24.7 Nutrition Risk Screening Items Score Screening NUTRITION RISK SCREEN: I have an illness or condition that made me change the kind and/or amount of food I eat 0 No I eat fewer than two meals per day 0 No I eat few fruits and vegetables, or milk products 0 No I have three or more drinks of beer, liquor or wine almost every day 0 No I have tooth or mouth problems that make it hard for me to eat 0 No I don't always have enough money to buy the food I need 0 No Dale [name], Dale [name] ([id number]) [phone] [email or web address] Page 5 of 5 I eat alone most of the time 0 No I take three or more different prescribed or over-the-counter drugs a day 0 No Without wanting to, I have lost or gained 10 pounds in the last six months 0 No I am not always physically  able to shop, cook and/or feed myself 0 No Nutrition Protocols Good Risk Protocol 0 No interventions needed Moderate Risk Protocol High Risk Proctocol Risk Level: Good Risk Score: 0 Electronic Signature(s) Signed: 10/23/2023 4:27:20 PM By: Angelina Pih Entered By: Angelina Pih on 10/23/2023 66:06:30
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
## 2023-10-29 ENCOUNTER — Other Ambulatory Visit: Payer: Self-pay | Admitting: Family Medicine

## 2023-10-29 DIAGNOSIS — I10 Essential (primary) hypertension: Secondary | ICD-10-CM

## 2023-10-30 ENCOUNTER — Ambulatory Visit: Payer: Medicare PPO | Admitting: Internal Medicine

## 2023-10-30 NOTE — Telephone Encounter (Signed)
Requested Prescriptions  Pending Prescriptions Disp Refills   propranolol (INDERAL) 40 MG tablet [Pharmacy Med Name: PROPRANOLOL 40 MG TABLET] 270 tablet 0    Sig: TAKE 1 TABLET BY MOUTH 3 TIMES DAILY.     Cardiovascular:  Beta Blockers Passed - 10/30/2023  5:50 PM      Passed - Last BP in normal range    BP Readings from Last 1 Encounters:  09/03/23 112/76         Passed - Last Heart Rate in normal range    Pulse Readings from Last 1 Encounters:  09/03/23 84         Passed - Valid encounter within last 6 months    Recent Outpatient Visits           2 months ago Annual physical exam   [place] [facility] [place], Netta Neat, DO   3 months ago Diabetic ulcer of toe of right foot associated with type 2 diabetes mellitus, with fat layer exposed [facility])   [place] [facility], Salvadore Oxford, NP   4 months ago Essential (primary) hypertension   [place] [facility] Delles, Gentry Fitz A, RPH-CPP   4 months ago Essential (primary) hypertension   [place] [facility] Delles, Gentry Fitz A, RPH-CPP   7 months ago Hyperlipidemia associated with type 2 diabetes mellitus [facility])   [place] [facility] Delles, Jackelyn Poling, RPH-CPP       Future Appointments             In 10 months Stoioff, Verna Czech, MD [facility] [place]

## 2023-10-31 ENCOUNTER — Ambulatory Visit: Payer: Medicare PPO | Admitting: Internal Medicine

## 2023-11-01 ENCOUNTER — Other Ambulatory Visit: Payer: Self-pay | Admitting: Family Medicine

## 2023-11-01 DIAGNOSIS — I10 Essential (primary) hypertension: Secondary | ICD-10-CM

## 2023-11-03 ENCOUNTER — Encounter: Payer: Medicare PPO | Admitting: Internal Medicine

## 2023-11-03 DIAGNOSIS — E11621 Type 2 diabetes mellitus with foot ulcer: Secondary | ICD-10-CM | POA: Diagnosis not present

## 2023-11-03 DIAGNOSIS — L97512 Non-pressure chronic ulcer of other part of right foot with fat layer exposed: Secondary | ICD-10-CM | POA: Diagnosis not present

## 2023-11-03 DIAGNOSIS — L97513 Non-pressure chronic ulcer of other part of right foot with necrosis of muscle: Secondary | ICD-10-CM | POA: Diagnosis not present

## 2023-11-03 DIAGNOSIS — E1143 Type 2 diabetes mellitus with diabetic autonomic (poly)neuropathy: Secondary | ICD-10-CM | POA: Diagnosis not present

## 2023-11-03 DIAGNOSIS — I10 Essential (primary) hypertension: Secondary | ICD-10-CM | POA: Diagnosis not present

## 2023-11-03 DIAGNOSIS — Z87891 Personal history of nicotine dependence: Secondary | ICD-10-CM | POA: Diagnosis not present

## 2023-11-03 NOTE — Progress Notes (Signed)
Dale [name] ([id number]) [id number] Page 1 of 8 Visit Report for 11/03/2023 Debridement Details Patient Name: Date of Service: Dale [name], Dale [name] 11/03/2023 1:30 PM Medical Record Number: [id number] Patient Account Number: [id number] Date of Birth/Sex: Treating RN: 1961/01/24 (62 y.o. Dale [name]) Dale [name] Primary Care Provider: Saralyn [name] Other Clinician: Referring Provider: Treating Provider/Extender: RO BSO N, Dale [name], Dale [name] in Treatment: 1 Debridement Performed for Assessment: Wound #1 Right T Great oe Performed By: Physician Dale Caul, MD The following information was scribed by: Dale [name] The information was scribed for: Dale [name] Debridement Type: Debridement Severity of Tissue Pre Debridement: Fat layer exposed Level of Consciousness (Pre-procedure): Awake and Alert Pre-procedure Verification/Time Out Yes - 13:50 Taken: Start Time: 13:50 Percent of Wound Bed Debrided: 100% T Area Debrided (cm): otal 5781.52 Tissue and other material debrided: Viable, Non-Viable, Callus, Slough, Subcutaneous, Skin: Dermis , Skin: Epidermis, Slough Level: Skin/Subcutaneous Tissue Debridement Description: Excisional Instrument: Curette Bleeding: Minimum Hemostasis Achieved: Pressure End Time: 13:56 Procedural Pain: 0 Post Procedural Pain: 0 Response to Treatment: Procedure was tolerated well Level of Consciousness (Post- Awake and Alert procedure): Post Debridement Measurements of Total Wound Length: (cm) 98.2 Width: (cm) 75 Depth: (cm) 18 Volume: (cm) 104120.2 Character of Wound/Ulcer Post Debridement: Improved Severity of Tissue Post Debridement: Fat layer exposed Post Procedure Diagnosis Same as Pre-procedure Electronic Signature(s) Signed: 11/03/2023 3:37:08 PM By: Dale Pax RN Signed: 11/03/2023 4:31:08 PM By: Dale Najjar MD Entered By: Dale [name] on 11/03/2023 11:11:17 Dale [name] ([id number]) [id number] Page 2 of 8 -------------------------------------------------------------------------------- Debridement Details Patient Name: Date of Service: Dale [name], Dale [name] 11/03/2023 1:30 PM Medical Record Number: [id number] Patient Account Number: [id number] Date of Birth/Sex: Treating RN: [birth year] (62 y.o. Dale [name]) Dale [name] Primary Care Provider: Saralyn [name] Other Clinician: Referring Provider: Treating Provider/Extender: RO BSO N, Dale [name], Dale [name] in Treatment: 1 Debridement Performed for Assessment: Wound #2 Right T Third oe Performed By: Physician Dale Caul, MD The following information was scribed by: Dale [name] The information was scribed for: Dale [name] Debridement Type: Debridement Severity of Tissue Pre Debridement: Fat layer exposed Level of Consciousness (Pre-procedure): Awake and Alert Pre-procedure Verification/Time Out Yes - 13:50 Taken: Start Time: 13:50 Percent of Wound Bed Debrided: 100% T Area Debrided (cm): otal 0.11 Tissue and other material debrided: Viable, Non-Viable, Callus, Slough, Subcutaneous, Skin: Dermis , Skin: Epidermis, Slough Level: Skin/Subcutaneous Tissue Debridement Description: Excisional Instrument: Curette Bleeding: Minimum Hemostasis Achieved: Pressure End Time: 13:56 Procedural Pain: 0 Post Procedural Pain: 0 Response to Treatment: Procedure was tolerated well Level of Consciousness (Post- Awake and Alert procedure): Post Debridement Measurements of Total Wound Length: (cm) 0.2 Width: (cm) 0.7 Depth: (cm) 0.2 Volume: (cm) 0.022 Character of Wound/Ulcer Post Debridement: Improved Severity of Tissue Post Debridement: Fat layer exposed Post Procedure Diagnosis Same as Pre-procedure Electronic Signature(s) Signed: 11/03/2023 3:37:08 PM By: Dale Pax RN Signed: 11/03/2023 4:31:08 PM By: Dale Najjar MD Entered By: Dale [name] on 11/03/2023 11:11:24 Dale [name] ([id number]) [id number] Page 3 of 8 -------------------------------------------------------------------------------- HPI Details Patient Name: Date of Service: Dale [name], Dale [name] 11/03/2023 1:30 PM Medical Record Number: [id number] Patient Account Number: [id number] Date of Birth/Sex: Treating RN: 1961-04-20 (62 y.o. Dale [name]) Dale [name] Primary Care Provider: Saralyn [name] Other Clinician: Referring Provider: Treating Provider/Extender: RO BSO N, Dale [name], Dale [name] in Treatment: 1 History of Present Illness Chronic/Inactive Conditions Condition 1: 10-23-2023 patient had ABIs which were performed on 09-15-2023  and showed that he apparently has a right ABI of 1.15 with a TBI of 0.98 and on the left an ABI of 1.13 with a TBI of 0.65. HPI Description: 10-23-2023 upon evaluation today patient appears for initial inspection here in our clinic concerning issues that has been having with a right great toe ulcer. He has had a previous hammertoe correction and the x-ray I did review. Patient's x-ray shows that he has unchanged alignment of the first metatarsophalangeal joint following arthrodesis and internal fixation. This was performed on 10-16-2023 there was no evidence noted for osteomyelitis. With that being said he is having a wound here that just does not seem to want to heal. Has been using Medihoney at this point he has a lot of necrotic tissue there is muscle exposed at this point. Fortunately I do not see any signs of systemic infection which is great news. With that being said the patient does have some issue here with what appears to be local infection and I discussed that with him today as well. I think we may want to put him on Bactrim just to get something started until we get the results of the PCR culture back. Patient does have a history of hypertension, diabetes mellitus type 2,  diabetic neuropathy, and again this toe ulcer that he currently is dealing with. 12/30; since the patient was last here he had a fall while he was at [facility] and the forefoot off loader. Since then he has been back in his regular shoes. He comes in with the area on the right plantar first toe dull looking with thick black callus around the edges. He also had a new area identified on the right third toe plantar medially The patient reminds me he has had a first toe fixation apparently has hardware and screws in here. This was apparently recently done. Electronic Signature(s) Signed: 11/03/2023 4:31:08 PM By: Dale Najjar MD Entered By: Dale [name] on 11/03/2023 11:08:25 -------------------------------------------------------------------------------- Physical Exam Details Patient Name: Date of Service: Dale [name], Dale [name] 11/03/2023 1:30 PM Medical Record Number: [id number] Patient Account Number: [id number] Date of Birth/Sex: Treating RN: 01-17-1961 (62 y.o. Melonie [place] Primary Care Provider: Saralyn [name] Other Clinician: Referring Provider: Treating Provider/Extender: RO BSO N, Dale [name], Dale [name] in Treatment: 1 Constitutional Sitting or standing Blood Pressure is within target range for patient.. Pulse regular and within target range for patient.Marland Kitchen Respirations regular, non-labored and within target range.. Temperature is normal and within the target range for the patient.Marland Kitchen appears in no distress. Notes Wound exam Plantar right first toe. Dull wound surface. Considerable relative depth. There is no exposed bone Or hardware here however not far off it. I used a #5 curette to pare back some of the callus around the edges also on the right third toe wound which is very tiny linear linear wound. Electronic Signature(s) Signed: 11/03/2023 4:31:08 PM By: Dale Najjar MD Entered By: Dale [name] on 11/03/2023 11:08:53 Dale [name] ([id number])  [id number] Page 4 of 8 -------------------------------------------------------------------------------- Physician Orders Details Patient Name: Date of Service: Dale [name], Dale [name] 11/03/2023 1:30 PM Medical Record Number: [id number] Patient Account Number: [id number] Date of Birth/Sex: Treating RN: 1961-01-07 (62 y.o. Dale [name]) Dale [name] Primary Care Provider: Saralyn [name] Other Clinician: Referring Provider: Treating Provider/Extender: RO BSO N, Dale [name], Dale [name] in Treatment: 1 The following information was scribed by: Dale [name] The information was scribed for: Dale [name] Verbal / Phone Orders: No Diagnosis Coding Follow-up  Appointments Return Appointment in 1 week. Bathing/ Shower/ Hygiene May shower; gently cleanse wound with antibacterial soap, rinse and pat dry prior to dressing wounds - Shower with dressing on and at end take dressing off clean wound, recommended use dial original gold soap, dry and apply new dressing May shower with wound dressing protected with water repellent cover or cast protector. - only needed on days not changing dressing No tub bath. Anesthetic (Use 'Patient Medications' Section for Anesthetic Order Entry) Lidocaine applied to wound bed Edema Control - Orders / Instructions Elevate, Exercise Daily and A void Standing for Long Periods of Time. Elevate legs to the level of the heart and pump ankles as often as possible Elevate leg(s) parallel to the floor when sitting. DO YOUR BEST to sleep in the bed at night. DO NOT sleep in your recliner. Long hours of sitting in a recliner leads to swelling of the legs and/or potential wounds on your backside. Off-Loading Other: - front offloading surg shoe provided, pt advised to wear when up on feet except for when he driving Medications-Please add to medication list. ntibiotics - Please pick up and start taking as prescribed P.O. A Wound  Treatment Wound #1 - T Great oe Wound Laterality: Right Cleanser: Soap and Water 3 x Per Week/15 Days Discharge Instructions: Gently cleanse wound with antibacterial soap, rinse and pat dry prior to dressing wounds Cleanser: Wound Cleanser 3 x Per Week/15 Days Discharge Instructions: Wash your hands with soap and water. Remove old dressing, discard into plastic bag and place into trash. Cleanse the wound with Wound Cleanser prior to applying a clean dressing using gauze sponges, not tissues or cotton balls. Do not scrub or use excessive force. Pat dry using gauze sponges, not tissue or cotton balls. Topical: Gentamicin 3 x Per Week/15 Days Discharge Instructions: Apply as directed by provider. Prim Dressing: Hydrofera Blue Ready Transfer Foam, 2.5x2.5 (in/in) 3 x Per Week/15 Days ary Discharge Instructions: Apply Hydrofera Blue Ready to wound bed as directed Secondary Dressing: Gauze 3 x Per Week/15 Days Discharge Instructions: As directed: dry Secured With: Stretch Net Dressing, Latex-free, Size 5, Small-Head / Shoulder / Thigh 3 x Per Week/15 Days Discharge Instructions: size 2 used Wound #2 - T Third oe Wound Laterality: Right Cleanser: Soap and Water 3 x Per Week/15 Days Discharge Instructions: Gently cleanse wound with antibacterial soap, rinse and pat dry prior to dressing wounds Cleanser: Wound Cleanser 3 x Per [address] Dale [name], Dale [name] ([id number]) [id number] Page 5 of 8 Discharge Instructions: Wash your hands with soap and water. Remove old dressing, discard into plastic bag and place into trash. Cleanse the wound with Wound Cleanser prior to applying a clean dressing using gauze sponges, not tissues or cotton balls. Do not scrub or use excessive force. Pat dry using gauze sponges, not tissue or cotton balls. Topical: Gentamicin 3 x Per Week/15 Days Discharge Instructions: Apply as directed by provider. Prim Dressing: Hydrofera Blue Ready  Transfer Foam, 2.5x2.5 (in/in) 3 x Per Week/15 Days ary Discharge Instructions: Apply Hydrofera Blue Ready to wound bed as directed Secondary Dressing: Gauze 3 x Per Week/15 Days Discharge Instructions: As directed: dry Secured With: Stretch Net Dressing, Latex-free, Size 5, Small-Head / Shoulder / Thigh 3 x Per Week/15 Days Discharge Instructions: size 2 used Electronic Signature(s) Signed: 11/03/2023 3:37:08 PM By: Dale Pax RN Signed: 11/03/2023 4:31:08 PM By: Dale Najjar MD Entered By: Dale [name] on 11/03/2023 12:02:17 -------------------------------------------------------------------------------- Problem List Details Patient Name: Date of Service: Dale [name],  Dale [name] 11/03/2023 1:30 PM Medical Record Number: [id number] Patient Account Number: [id number] Date of Birth/Sex: Treating RN: 08-Jan-1961 (62 y.o. 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 10/23/2023 No Yes I10 Essential (primary) hypertension 10/23/2023 No Yes Inactive Problems Resolved Problems Electronic Signature(s) Signed: 11/03/2023 4:31:08 PM By: Dale Najjar MD Dale [name] ([id number]) [id number] Page 6 of 8 Entered By: Dale [name] on 11/03/2023 11:04:27 -------------------------------------------------------------------------------- Progress Note Details Patient Name: Date of Service: Dale [name], Dale [name] 11/03/2023 1:30 PM Medical Record Number: [id number] Patient Account Number:  [id number] Date of Birth/Sex: Treating RN: 10/07/1961 (62 y.o. Dale [name]) Dale [name] Primary Care Provider: Saralyn [name] Other Clinician: Referring Provider: Treating Provider/Extender: RO BSO N, Dale [name], Dale [name] in Treatment: 1 Subjective History of Present Illness (HPI) Chronic/Inactive Condition: 10-23-2023 patient had ABIs which were performed on 09-15-2023 and showed that he apparently has a right ABI of 1.15 with a TBI of 0.98 and on the left an ABI of 1.13 with a TBI of 0.65. 10-23-2023 upon evaluation today patient appears for initial inspection here in our clinic concerning issues that has been having with a right great toe ulcer. He has had a previous hammertoe correction and the x-ray I did review. Patient's x-ray shows that he has unchanged alignment of the first metatarsophalangeal joint following arthrodesis and internal fixation. This was performed on 10-16-2023 there was no evidence noted for osteomyelitis. With that being said he is having a wound here that just does not seem to want to heal. Has been using Medihoney at this point he has a lot of necrotic tissue there is muscle exposed at this point. Fortunately I do not see any signs of systemic infection which is great news. With that being said the patient does have some issue here with what appears to be local infection and I discussed that with him today as well. I think we may want to put him on Bactrim just to get something started until we get the results of the PCR culture back. Patient does have a history of hypertension, diabetes mellitus type 2, diabetic neuropathy, and again this toe ulcer that he currently is dealing with. 12/30; since the patient was last here he had a fall while he was at [facility] and the forefoot off loader. Since then he has been back in his regular shoes. He comes in with the area on the right plantar first toe dull looking with thick black callus around the edges. He  also had a new area identified on the right third toe plantar medially The patient reminds me he has had a first toe fixation apparently has hardware and screws in here. This was apparently recently done. Objective Constitutional Sitting or standing Blood Pressure is within target range for patient.. Pulse regular and within target range for patient.Marland Kitchen Respirations regular, non-labored and within target range.. Temperature is normal and within the target range for the patient.Marland Kitchen appears in no distress. Vitals Time Taken: 1:26 PM, Height: 72 in, Weight: 182 lbs, BMI: 24.7, Temperature: 98.2 F, Pulse: 75 bpm, Respiratory Rate: 18 breaths/min, Blood Pressure: 116/74 mmHg. General Notes: Wound  exam  Plantar right first toe. Dull wound surface. Considerable relative depth. There is no exposed bone Or hardware here however not far off it. I used a #5 curette to pare back some of the callus around the edges also on the right third toe wound which is very tiny linear linear wound. Integumentary (Hair, Skin) Wound #1 status is Open. Original cause of wound was Gradually Appeared. The date acquired was: 06/05/2023. The wound has been in treatment 1 weeks. The wound is located on the Right T Great. The wound measures 2.5cm length x 2cm width x 0.4cm depth; 3.927cm^2 area and 1.571cm^3 volume. There is Fat oe Layer (Subcutaneous Tissue) exposed. There is no tunneling or undermining noted. There is a medium amount of serosanguineous drainage noted. There is small (1-33%) red, pink granulation within the wound bed. There is a large (67-100%) amount of necrotic tissue within the wound bed including Adherent Slough. Wound #2 status is Open. Original cause of wound was Gradually Appeared. The date acquired was: 10/30/2023. The wound is located on the Right T Third. oe The wound measures 0.2cm length x 0.7cm width x 0.2cm depth; 0.11cm^2 area and 0.022cm^3 volume. There is Fat Layer (Subcutaneous Tissue)  exposed. There is no tunneling or undermining noted. There is a medium amount of serosanguineous drainage noted. There is large (67-100%) red granulation within the wound bed. There is no necrotic tissue within the wound bed. 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 There was a Excisional Skin/Subcutaneous Tissue Debridement with a total area of 5781.52 sq cm performed by Dale Caul, MD. With the following instrument(s): Curette to remove Viable and Non-Viable tissue/material. Material removed includes Callus, Subcutaneous Tissue, Slough, Skin: Dermis, and Skin: Epidermis. No specimens were taken. A time out was conducted at 13:50, prior to the start of the procedure. A Minimum amount of bleeding was controlled with Pressure. The procedure was tolerated well with a pain level of 0 throughout and a pain level of 0 following the procedure. Post Debridement Measurements: 98.2cm length x 75cm width x 18cm depth; 104120.2cm^3 volume. Character of Wound/Ulcer Post Debridement is improved. Severity of Tissue Post Debridement is: Fat layer exposed. Post procedure Diagnosis Wound #1: Same as Pre-Procedure Wound #2 Pre-procedure diagnosis of Wound #2 is a Diabetic Wound/Ulcer of the Lower Extremity located on the Right T Third .Severity of Tissue Pre Debridement is: oe Fat layer exposed. There was a Excisional Skin/Subcutaneous Tissue Debridement with a total area of 0.11 sq cm performed by Dale Caul,  MD. With the following instrument(s): Curette to remove Viable and Non-Viable tissue/material. Material removed includes Callus, Subcutaneous Tissue, Slough, Skin: Dermis, and Skin: Epidermis. No specimens were taken. A time out was conducted at 13:50, prior to the start of the procedure. A Minimum amount of bleeding was controlled with Pressure. The procedure was tolerated well with a pain level of 0 throughout and a pain level of 0 following the procedure. Post Debridement Measurements: 0.2cm length x 0.7cm width x 0.2cm depth; 0.022cm^3 volume. Character of Wound/Ulcer Post Debridement is improved. Severity of Tissue Post Debridement is: Fat  layer exposed. Post procedure Diagnosis Wound #2: Same as Pre-Procedure Plan Follow-up Appointments: Return Appointment in 1 week. Bathing/ Shower/ Hygiene: May shower; gently cleanse wound with antibacterial soap, rinse and pat dry prior to dressing wounds - Shower with dressing on and at end take dressing off clean wound, recommended use dial original gold soap, dry and apply new dressing May shower with wound dressing protected with water repellent cover or cast protector. - only needed on days not changing dressing No tub bath. Anesthetic (Use 'Patient Medications' Section for Anesthetic Order Entry): Lidocaine applied to wound bed Edema Control - Orders / Instructions: Elevate, Exercise Daily and Avoid Standing for Long Periods of Time. Elevate legs to the level of the heart and pump ankles as often as possible Elevate leg(s) parallel to the floor when sitting. DO YOUR BEST to sleep in the bed at night. DO NOT sleep in your recliner. Long hours of sitting in a recliner leads to swelling of the legs and/or potential wounds on your backside. Off-Loading: Offloading felt to foot. - front offloading surg shoe provided, pt advised to wear when up on feet except for when he driving Medications-Please add to medication list.: P.O. Antibiotics - Please  pick up and start taking as prescribed WOUND #1: - T Great Wound Laterality: Right oe Cleanser: Soap and Water 3 x Per Week/15 Days Discharge Instructions: Gently cleanse wound with antibacterial soap, rinse and pat dry prior to dressing wounds Cleanser: Wound Cleanser 3 x Per Week/15 Days Discharge Instructions: Wash your hands with soap and water. Remove old dressing, discard into plastic bag and place into trash. Cleanse the wound with Wound Cleanser prior to applying a clean dressing using gauze sponges, not tissues or cotton balls. Do not scrub or use excessive force. Pat dry using gauze sponges, not tissue or cotton balls. Topical: Gentamicin 3 x Per Week/15 Days Discharge Instructions: Apply as directed by provider. Prim Dressing: Hydrofera Blue Ready Transfer Foam, 2.5x2.5 (in/in) 3 x Per Week/15 Days ary Discharge Instructions: Apply Hydrofera Blue Ready to wound bed as directed Secondary Dressing: Gauze 3 x Per Week/15 Days Discharge Instructions: As directed: dry Secured With: Stretch Net Dressing, Latex-free, Size 5, Small-Head / Shoulder / Thigh 3 x Per Week/15 Days Discharge Instructions: size 2 used WOUND #2: - T Third Wound Laterality: Right oe Cleanser: Soap and Water 3 x Per Week/15 Days Discharge Instructions: Gently cleanse wound with antibacterial soap, rinse and pat dry prior to dressing wounds Cleanser: Wound Cleanser 3 x Per Week/15 Days Discharge Instructions: Wash your hands with soap and water. Remove old dressing, discard into plastic bag and place into trash. Cleanse the wound with Wound Cleanser prior to applying a clean dressing using gauze sponges, not tissues or cotton balls. Do not scrub or use excessive force. Pat dry using gauze sponges, not tissue or cotton balls. Topical: Gentamicin 3 x Per Week/15 Days Discharge Instructions: Apply as directed by provider. Prim Dressing: Hydrofera Blue Ready Transfer Foam, 2.5x2.5 (in/in) 3 x Per Week/15  Days ary Discharge Instructions: Apply Hydrofera Blue Ready to wound bed as directed Secondary Dressing: Gauze 3 x Per [address] Dale [name], Dale [name] ([id number]) [id number] Page 8 of 8 Discharge Instructions: As directed: dry Secured With: Stretch Net Dressing, Latex-free, Size 5, Small-Head / Shoulder / Thigh 3 x Per Week/15 Days Discharge Instructions: size 2 used 1. I did not change the primary dressing which is Hydrofera Blue 2. I encouraged him to get back in the forefoot offloading shoe and  showed him how to transition weight back to his heel. He claims to be limiting activity however there is a lot of thick black callus around the area on the first toe on arrival 3. Ultimately may need a total contact cast 4. No evidence of surrounding infection or deep infection currently Electronic Signature(s) Signed: 11/03/2023 4:31:08 PM By: Dale Najjar MD Entered By: Dale [name] on 11/03/2023 11:10:28 -------------------------------------------------------------------------------- SuperBill Details Patient Name: Date of Service: Dale [name] Scot 11/03/2023 Medical Record Number: [id number] Patient Account Number: [id number] Date of Birth/Sex: Treating RN: 1961/05/15 (62 y.o. 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 I10 Essential (primary) hypertension Facility Procedures : CPT4 Code: 40981191 Description: 11042 - DEB SUBQ TISSUE 20 SQ CM/< ICD-10 Diagnosis Description L97.513 Non-pressure chronic ulcer of other part of right foot with necrosis  of muscle Modifier: Quantity: 1 : CPT4 Code: 47829562 Description: 11045 - DEB SUBQ TISS EA ADDL 20CM Modifier: Quantity: 289 Physician Procedures : CPT4 Code Description Modifier 11042 11042 - WC PHYS SUBQ TISS 20 SQ CM ICD-10 Diagnosis Description L97.513 Non-pressure chronic ulcer of other part of right foot with necrosis of muscle Quantity: 1 Electronic Signature(s) Signed: 11/03/2023 4:31:08 PM By: Dale Najjar MD Entered By: Dale [name] on 11/03/2023 11:10:57
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
## 2023-11-06 NOTE — Telephone Encounter (Signed)
 Refused Lisinopril 5 mg because it was discontinued 02/10/2023.

## 2023-11-12 ENCOUNTER — Encounter: Payer: Medicare Other | Attending: Physician Assistant | Admitting: Physician Assistant

## 2023-11-12 DIAGNOSIS — H409 Unspecified glaucoma: Secondary | ICD-10-CM | POA: Diagnosis not present

## 2023-11-12 DIAGNOSIS — I2510 Atherosclerotic heart disease of native coronary artery without angina pectoris: Secondary | ICD-10-CM | POA: Diagnosis not present

## 2023-11-12 DIAGNOSIS — W19XXXA Unspecified fall, initial encounter: Secondary | ICD-10-CM | POA: Diagnosis not present

## 2023-11-12 DIAGNOSIS — E11621 Type 2 diabetes mellitus with foot ulcer: Secondary | ICD-10-CM | POA: Insufficient documentation

## 2023-11-12 DIAGNOSIS — I10 Essential (primary) hypertension: Secondary | ICD-10-CM | POA: Insufficient documentation

## 2023-11-12 DIAGNOSIS — E1143 Type 2 diabetes mellitus with diabetic autonomic (poly)neuropathy: Secondary | ICD-10-CM | POA: Diagnosis not present

## 2023-11-12 DIAGNOSIS — L97513 Non-pressure chronic ulcer of other part of right foot with necrosis of muscle: Secondary | ICD-10-CM | POA: Diagnosis not present

## 2023-11-12 DIAGNOSIS — L97512 Non-pressure chronic ulcer of other part of right foot with fat layer exposed: Secondary | ICD-10-CM | POA: Diagnosis not present

## 2023-11-12 NOTE — Progress Notes (Addendum)
 BEXLEY, MCLESTER ([id number]) [id number] Page 1 of 8 Visit Report for 11/12/2023 Chief Complaint Document Details Patient Name: Date of Service: Dale [name], Dale E. 11/12/2023 12:45 PM Medical Record Number: [id number] Patient Account Number: [id number] Date of Birth/Sex: Treating RN: Dec 28, 1960 (63 [name] Dale) Dale [name] Primary Care Provider: Edman Blunt Other Clinician: Referring Provider: Treating Provider/Extender: Bethena Andre Edman Blunt Devra in Treatment: 2 Information Obtained from: Patient Chief Complaint Right great toe ulcer Electronic Signature(s) Signed: 11/12/2023 1:01:15 PM By: Bethena Andre PA-C Entered By: Bethena Andre on 11/12/2023 13:01:14 -------------------------------------------------------------------------------- Debridement Details Patient Name: Date of Service: Dale [name], Dale E. 11/12/2023 12:45 PM Medical Record Number: [id number] Patient Account Number: [id number] Date of Birth/Sex: Treating RN: 1961-10-09 (63 [name] Dale [name] Dale [name] Primary Care Provider: Edman Blunt Other Clinician: Referring Provider: Treating Provider/Extender: Bethena Andre Edman Blunt Devra in Treatment: 2 Debridement Performed for Assessment: Wound #1 Right T Great oe Performed By: Physician Bethena Andre, PA-C The following information was scribed by: Dale [name] The information was scribed for: Bethena Andre Debridement Type: Debridement Severity of Tissue Pre Debridement: Fat layer exposed Level of Consciousness (Pre-procedure): Awake and Alert Pre-procedure Verification/Time Out Yes - 13:15 Taken: Start Time: 13:15 Percent of Wound Bed Debrided: 100% T Area Debrided (cm): otal 3.53 Tissue and other material debrided: Viable, Non-Viable, Callus, Slough, Subcutaneous, Skin: Dermis , Skin: Epidermis, Slough Level: Skin/Subcutaneous Tissue Debridement Description: Excisional Instrument: Curette Bleeding:  Minimum Hemostasis Achieved: Pressure Dale [name] ([id number]) [id number] Page 2 of 8 End Time: 13:19 Procedural Pain: 0 Post Procedural Pain: 0 Response to Treatment: Procedure was tolerated well Level of Consciousness (Post- Awake and Alert procedure): Post Debridement Measurements of Total Wound Length: (cm) 2.5 Width: (cm) 1.8 Depth: (cm) 0.3 Volume: (cm) 1.06 Character of Wound/Ulcer Post Debridement: Improved Severity of Tissue Post Debridement: Fat layer exposed Post Procedure Diagnosis Same as Pre-procedure Electronic Signature(s) Signed: 11/14/2023 8:09:36 AM By: Dale, BSN, RN, CWS, Kim RN, BSN Signed: 11/14/2023 12:58:57 PM By: Bethena Andre PA-C Previous Signature: 11/12/2023 1:29:25 PM Version By: Dale Sailors RN Previous Signature: 11/12/2023 6:16:28 PM Version By: Bethena Andre PA-C Entered By: Dale BSN, RN, CWS, Dale on 11/14/2023 08:09:36 -------------------------------------------------------------------------------- HPI Details Patient Name: Date of Service: Dale [name] 11/12/2023 12:45 PM Medical Record Number: [id number] Patient Account Number: [id number] Date of Birth/Sex: Treating RN: Apr 12, 1961 (63 [name] Dale [name] Dale [name] Primary Care Provider: Edman Blunt Other Clinician: Referring Provider: Treating Provider/Extender: Bethena Andre Edman Blunt Devra in Treatment: 2 History of Present Illness Chronic/Inactive Conditions Condition 1: 10-23-2023 patient had ABIs which were performed on 09-15-2023 and showed that he apparently has a right ABI of 1.15 with a TBI of 0.98 and on the left an ABI of 1.13 with a TBI of 0.65. HPI Description: 10-23-2023 upon evaluation today patient appears for initial inspection here in our clinic concerning issues that has been having with a right great toe ulcer. He has had a previous hammertoe correction and the x-ray I did review. Patient's x-ray shows that he has unchanged  alignment of the first metatarsophalangeal joint following arthrodesis and internal fixation. This was performed on 10-16-2023 there was no evidence noted for osteomyelitis. With that being said he is having a wound here that just does not seem to want to heal. Has been using Medihoney at this point he has a lot of necrotic tissue there is muscle exposed at this point. Fortunately I do not see any signs of systemic infection which is great news. With that being said  the patient does have some issue here with what appears to be local infection and I discussed that with him today as well. I think we may want to put him on Bactrim  just to get something started until we get the results of the PCR culture back. Patient does have a history of hypertension, diabetes mellitus type 2, diabetic neuropathy, and again this toe ulcer that he currently is dealing with. 12/30; since the patient was last here he had a fall while he was at [facility] and the forefoot off loader. Since then he has been back in his regular shoes. He comes in with the area on the right plantar first toe dull looking with thick black callus around the edges. He also had a new area identified on the right third toe plantar medially The patient reminds me he has had a first toe fixation apparently has hardware and screws in here. This was apparently recently done. 11-12-23 upon evaluation today patient appears to be doing excellent in regard to his wound. He has been tolerating the dressing changes without complication. This actually showing signs of significant improvement which is great news and in general I do believe that we will make an excellent headway here towards closure. Electronic Signature(s) Signed: 11/12/2023 1:22:44 PM By: Bethena Ferraris PA-C Entered By: Bethena Ferraris on 11/12/2023 13:22:44 Dale [name] ([id number]) [id number] Page 3 of  8 -------------------------------------------------------------------------------- Physical Exam Details Patient Name: Date of Service: Dale [name], Dale E. 11/12/2023 12:45 PM Medical Record Number: [id number] Patient Account Number: [id number] Date of Birth/Sex: Treating RN: [birth year] (62 [name] Dale [name] Primary Care Provider: Edman Blunt Other Clinician: Referring Provider: Treating Provider/Extender: Bethena Ferraris Edman Blunt Devra in Treatment: 2 Constitutional Well-nourished and well-hydrated in no acute distress. Respiratory normal breathing without difficulty. Psychiatric this patient is able to make decisions and demonstrates good insight into disease process. Alert and Oriented x 3. pleasant and cooperative. Notes Upon inspection patient's wound bed showed evidence of good granulation epithelization at this point. Fortunately I do not see any evidence of worsening overall and I believe that the patient is making excellent headway here towards closure which is great news. No fevers, chills, nausea, vomiting, or diarrhea. Electronic Signature(s) Signed: 11/12/2023 1:23:01 PM By: Bethena Ferraris PA-C Entered By: Bethena Ferraris on 11/12/2023 13:23:01 -------------------------------------------------------------------------------- Physician Orders Details Patient Name: Date of Service: Dale [name], Keyonta E. 11/12/2023 12:45 PM Medical Record Number: [id number] Patient Account Number: [id number] Date of Birth/Sex: Treating RN: 10-22-1961 (62 [name] 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 Bathing/ Shower/ Hygiene May shower; gently cleanse wound with antibacterial soap, rinse and pat dry prior to dressing wounds - Shower with dressing on and at end take dressing off clean wound, recommended use dial original gold soap, dry and apply new dressing May shower with wound dressing protected with water repellent cover or cast protector. - only needed on days not changing dressing No tub bath. Anesthetic (Use 'Patient Medications' Section for  Anesthetic Order Entry) Lidocaine  applied to wound bed Edema Control - Orders / Instructions Elevate, Exercise Daily and A void Standing for Long Periods of Time. Elevate legs to the level of the heart and pump ankles as often as possible Elevate leg(s) parallel to the floor when sitting. DO YOUR BEST to sleep in the bed at night. DO NOT sleep in your recliner. Long hours of sitting in a recliner leads to swelling of the legs and/or potential wounds on your backside. Off-Loading Other: - front offloading surg shoe provided, pt advised to wear when up on feet except for when he driving Medications-Please add to medication list. ntibiotics - Please pick up and start taking as prescribed P.O. A Wound Treatment Wound #1 - T Great oe Wound Laterality: Right Cleanser: Soap and Water 3 x Per Week/15 Days Discharge Instructions: Gently cleanse wound with antibacterial soap, rinse and pat dry prior to dressing wounds Cleanser: Wound Cleanser 3 x Per Week/15 Days Discharge Instructions: Wash your hands with soap and water. Remove old dressing, discard into plastic bag and place into trash. Cleanse the wound with Wound Cleanser  prior to applying a clean dressing using gauze sponges, not tissues or cotton balls. Do not scrub or use excessive force. Pat dry using gauze sponges, not tissue or cotton balls. Topical: Gentamicin  3 x Per Week/15 Days Discharge Instructions: Apply as directed by provider. Prim Dressing: Hydrofera Blue Ready Transfer Foam, 2.5x2.5 (in/in) 3 x Per Week/15 Days ary Discharge Instructions: Apply Hydrofera Blue Ready to wound bed as directed Secondary Dressing: Gauze 3 x Per Week/15 Days Discharge Instructions: As directed: dry Secured With: Stretch Net Dressing, Latex-free, Size 5, Small-Head / Shoulder / Thigh 3 x Per Week/15 Days Discharge Instructions: size 2 used Electronic Signature(s) Signed: 11/12/2023 3:46:47 PM By: Dale Sailors RN Signed: 11/12/2023 6:16:28 PM By: Bethena Ferraris PA-C Entered By: Dale [name] on 11/12/2023 13:15:08 -------------------------------------------------------------------------------- Problem List Details Patient Name: Date of Service: Dale [name] 11/12/2023 12:45 PM Medical Record Number: [id number] Patient Account Number: [id number] Date of Birth/Sex: Treating RN: Nov 18, 1960 (62 [name] 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 11/12/2023 12:45 PM Medical Record Number: [id number] Patient Account Number: [id number] Date of Birth/Sex: Treating RN: [birth year] (62 [name] Dale) Dale [name] Primary Care Provider: Edman Blunt Other Clinician: Referring Provider: Treating Provider/Extender: Bethena Ferraris Edman Blunt Devra in Treatment: 2 Subjective Chief Complaint Information obtained from Patient Right great toe ulcer History of Present Illness (HPI) Chronic/Inactive Condition: 10-23-2023 patient had ABIs which were performed on 09-15-2023 and showed that he apparently has a right ABI of 1.15 with a TBI of 0.98 and on the left an ABI of 1.13 with a TBI of 0.65. 10-23-2023 upon evaluation today patient appears for initial inspection here in our clinic concerning issues that has been having with a right great  toe ulcer. He has had a previous hammertoe correction and the x-ray I did review. Patient's x-ray shows that he has unchanged alignment of the first metatarsophalangeal joint following arthrodesis and internal fixation. This was performed on 10-16-2023 there was no evidence noted for osteomyelitis. With that being said he is having a wound here that just does not seem to want to heal. Has been using Medihoney at this point he has a lot of necrotic tissue there is muscle exposed at this point. Fortunately I do not see any signs of systemic infection which is great news. With that being said the patient does have some issue here with what appears to be local infection and I discussed that with him today as well. I think we may want to put him on Bactrim  just to get something started until we get the results of the PCR culture back. Patient does have a history of hypertension, diabetes mellitus type 2,  diabetic neuropathy, and again this toe ulcer that he currently is dealing with. 12/30; since the patient was last here he had a fall while he was at [facility] and the forefoot off loader. Since then he has been back in his regular shoes. He comes in with the area on the right plantar first toe dull looking with thick black callus around the edges. He also had a new area identified on the right third toe plantar medially OREE, HISLOP ([id number]) [id number] Page 6 of 8 The patient reminds me he has had a first toe fixation apparently has hardware and screws in here. This was apparently recently done. 11-12-23 upon evaluation today patient appears to be doing excellent in regard to his wound. He has been tolerating the dressing changes without complication. This actually showing signs of significant improvement which is great news and in general I do believe that we will make an excellent headway here towards closure. Objective Constitutional Well-nourished and well-hydrated in no acute distress. Vitals Time Taken: 12:46 PM, Height: 72 in, Weight: 182 lbs, BMI: 24.7, Temperature: 97.6 F, Pulse: 73 bpm, Respiratory Rate: 18 breaths/min, Blood Pressure: 136/79 mmHg. Respiratory normal breathing without difficulty. Psychiatric this patient is able to make decisions and demonstrates good insight into disease process. Alert and Oriented x 3. pleasant and cooperative. General Notes: Upon inspection patient's wound bed showed evidence of good granulation epithelization at this point. Fortunately I do not see any evidence of worsening overall and I believe that the patient is making excellent headway here towards closure which is great news. No fevers, chills, nausea, vomiting, or diarrhea. Integumentary (Hair, Skin) Wound #1 status is Open. Original cause of wound was Gradually Appeared. The date acquired was: 06/05/2023. The wound has been in treatment 2 weeks. The wound is  located on the Right T Great. The wound measures 2.5cm length x 1.8cm width x 0.3cm depth; 3.534cm^2 area and 1.06cm^3 volume. There is Fat oe Layer (Subcutaneous Tissue) exposed. There is no tunneling or undermining noted. There is a medium amount of serosanguineous drainage noted. There is medium (34-66%) red, pink granulation within the wound bed. There is a medium (34-66%) amount of necrotic tissue within the wound bed including Adherent Slough. Wound #2 status is Open. Original cause of wound was Gradually Appeared. The date acquired was: 10/30/2023. The wound has been in treatment 1 weeks. The wound is located on the Right T Third. The wound measures 0cm length x 0cm width x 0cm depth; 0cm^2 area and 0cm^3 volume. There is no tunneling oe or  undermining noted. There is a none present amount of drainage noted. There is no granulation within the wound bed. There is no necrotic tissue within the wound bed. 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 There was a Excisional Skin/Subcutaneous Tissue Debridement with a total area of 3.53 sq cm performed by Bethena Ferraris, PA-C. With the following instrument(s): Curette to remove Viable and Non-Viable tissue/material. Material removed includes Callus, Subcutaneous Tissue, Slough, Skin: Dermis, and Skin: Epidermis. No specimens were taken. A time out was conducted at 13:15, prior to the start of the procedure. A Minimum amount of bleeding was controlled with Pressure. The procedure was tolerated well with a pain level of 0 throughout and a pain level  of 0 following the procedure. Post Debridement Measurements: 2.5cm length x 1.8cm width x 0.3cm depth; 1.06cm^3 volume. Character of Wound/Ulcer Post Debridement is improved. Severity of Tissue Post Debridement is: Fat layer exposed. Post procedure Diagnosis Wound #1: Same as Pre-Procedure Plan Follow-up Appointments: Return Appointment in 1 week. Bathing/ Shower/ Hygiene: May shower; gently cleanse wound with antibacterial soap, rinse and pat dry prior to dressing wounds - Shower with dressing on and at end take dressing off clean wound, recommended use dial original gold soap, dry and apply new dressing KEENAN, TREFRY ([id number]) [id number] Page 7 of 8 May shower with wound dressing protected with water repellent cover or cast protector. - only needed on days not changing dressing No tub bath. Anesthetic (Use 'Patient Medications' Section for Anesthetic Order Entry): Lidocaine  applied to wound bed Edema Control - Orders / Instructions: Elevate, Exercise Daily and Avoid Standing for Long Periods of Time. Elevate legs to the level of the heart and pump ankles as often as possible Elevate leg(s) parallel to the floor when sitting. DO YOUR BEST to sleep in the bed at night. DO NOT sleep in your recliner. Long hours of sitting in a recliner leads to swelling of the legs and/or potential wounds on your backside. Off-Loading: Other: - front offloading surg shoe provided, pt advised to wear when up on feet except for when he driving Medications-Please add to medication list.: P.O. Antibiotics - Please pick up and start taking as prescribed WOUND #1: - T Great Wound Laterality: Right oe Cleanser: Soap and Water 3 x Per Week/15 Days Discharge Instructions: Gently cleanse wound with antibacterial soap, rinse and pat dry prior to dressing wounds Cleanser: Wound Cleanser 3 x Per Week/15 Days Discharge Instructions: Wash your hands with soap and water. Remove old  dressing, discard into plastic bag and place into trash. Cleanse the wound with Wound Cleanser prior to applying a clean dressing using gauze sponges, not tissues or cotton balls. Do not scrub or use excessive force. Pat dry using gauze sponges, not tissue or cotton balls. Topical: Gentamicin  3 x Per Week/15 Days Discharge Instructions: Apply as directed by provider. Prim Dressing: Hydrofera Blue Ready Transfer Foam, 2.5x2.5 (in/in) 3 x Per Week/15 Days ary Discharge Instructions: Apply Hydrofera Blue Ready to wound bed as directed Secondary Dressing: Gauze 3 x Per Week/15 Days Discharge Instructions: As directed: dry Secured With: Stretch Net Dressing, Latex-free, Size 5, Small-Head / Shoulder / Thigh  3 x Per Week/15 Days Discharge Instructions: size 2 used 1. I am going to recommend that we have the patient go ahead and continue to monitor for any signs of infection or worsening. Based on what I see I do believe that we are making the really good progress here towards closure and I think that the patient is doing excellent. He is using a front offloading shoe and he states that past the first fall he is had no other issues. 2. I am going to recommend as well that he should be using the Hydrofera Blue along with the gauze to secure in place with stretch net. 3. I am also can recommend that he should continue to monitor for any signs of infection the right now I do not see anything that looks obviously infected. We will see patient back for reevaluation in 1 week here in the clinic. If anything worsens or changes patient will contact our office for additional recommendations. Electronic Signature(s) Signed: 11/12/2023 2:12:52 PM By: Bethena Ferraris PA-C Previous Signature: 11/12/2023 1:23:44 PM Version By: Bethena Ferraris PA-C Entered By: Bethena Ferraris on 11/12/2023 14:12:52 -------------------------------------------------------------------------------- SuperBill Details Patient Name: Date of  Service: Dale [name] 11/12/2023 Medical Record Number: [id number] Patient Account Number: [id number] Date of Birth/Sex: Treating RN: 1960-11-10 (62 [name] 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 I10 Essential (primary) hypertension Facility Procedures JOIE, REAMER ([id number]): CPT4 Code Description [id number] [phone] - DEB SUBQ TISSUE 20 SQ CM/< ICD-10 Diagnosis Description L97.513 Non-pressure chronic ulcer of other part of right foot with necro [id number] Page 8 of 8: Modifier Quantity 1 sis of muscle Physician Procedures : CPT4 Code Description Modifier 11042 11042 - WC PHYS SUBQ TISS 20 SQ CM ICD-10 Diagnosis Description L97.513 Non-pressure chronic ulcer of other part of right foot with necrosis of muscle Quantity: 1 Electronic Signature(s) Signed: 11/12/2023 2:12:58 PM By: Bethena Ferraris PA-C Entered By: Bethena Ferraris on 11/12/2023 14:12:57
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
## 2023-11-17 DIAGNOSIS — L97511 Non-pressure chronic ulcer of other part of right foot limited to breakdown of skin: Secondary | ICD-10-CM | POA: Diagnosis not present

## 2023-11-17 DIAGNOSIS — M19071 Primary osteoarthritis, right ankle and foot: Secondary | ICD-10-CM | POA: Diagnosis not present

## 2023-11-19 ENCOUNTER — Encounter: Payer: Medicare Other | Admitting: Physician Assistant

## 2023-11-19 DIAGNOSIS — E1143 Type 2 diabetes mellitus with diabetic autonomic (poly)neuropathy: Secondary | ICD-10-CM | POA: Diagnosis not present

## 2023-11-19 DIAGNOSIS — E11621 Type 2 diabetes mellitus with foot ulcer: Secondary | ICD-10-CM | POA: Diagnosis not present

## 2023-11-19 DIAGNOSIS — H409 Unspecified glaucoma: Secondary | ICD-10-CM | POA: Diagnosis not present

## 2023-11-19 DIAGNOSIS — L97512 Non-pressure chronic ulcer of other part of right foot with fat layer exposed: Secondary | ICD-10-CM | POA: Diagnosis not present

## 2023-11-19 DIAGNOSIS — L97513 Non-pressure chronic ulcer of other part of right foot with necrosis of muscle: Secondary | ICD-10-CM | POA: Diagnosis not present

## 2023-11-19 DIAGNOSIS — I2510 Atherosclerotic heart disease of native coronary artery without angina pectoris: Secondary | ICD-10-CM | POA: Diagnosis not present

## 2023-11-19 DIAGNOSIS — I10 Essential (primary) hypertension: Secondary | ICD-10-CM | POA: Diagnosis not present

## 2023-11-20 NOTE — Progress Notes (Addendum)
Dale [name] ([id number]) [id number] Page 1 of 8 Visit Report for 11/19/2023 Chief Complaint Document Details Patient Name: Date of Service: [name], Dale [name] 11/19/2023 12:45 PM Medical Record Number: [id number] Patient Account Number: [id number] Date of Birth/Sex: Treating RN: 1961/09/08 (63 y.o. [name]) Yevonne Pax Primary Care Provider: [name] Other Clinician: Referring Provider: Treating Provider/Extender: Tiburcio Pea in Treatment: 3 Information Obtained from: Patient Chief Complaint Right great toe ulcer Electronic Signature(s) Signed: 11/19/2023 12:49:41 PM By: Allen Derry PA-C Entered By: Allen Derry on 11/19/2023 12:49:41 -------------------------------------------------------------------------------- Debridement Details Patient Name: Date of Service: Dale [name] 11/19/2023 12:45 PM Medical Record Number: [id number] Patient Account Number: [id number] Date of Birth/Sex: Treating RN: January 15, 1961 (63 y.o. Melonie [place] Primary Care Provider: [name] Other Clinician: Referring Provider: Treating Provider/Extender: Tiburcio Pea in Treatment: 3 Debridement Performed for Assessment: Wound #1 Right T Great oe Performed By: Physician Allen Derry, PA-C The following information was scribed by: Yevonne Pax The information was scribed for: Allen Derry Debridement Type: Debridement Severity of Tissue Pre Debridement: Fat layer exposed Level of Consciousness (Pre-procedure): Awake and Alert Pre-procedure Verification/Time Out Yes - 13:15 Taken: Start Time: 13:15 Percent of Wound Bed Debrided: 100% T Area Debrided (cm): otal 0.82 Tissue and other material debrided: Viable, Non-Viable, Callus, Slough, Subcutaneous, Skin: Dermis , Skin: Epidermis, Biofilm, Slough Level: Skin/Subcutaneous Tissue Debridement Description: Excisional Instrument:  Curette Bleeding: Moderate Hemostasis Achieved: Pressure TREVIN, WOOLERY ([id number]) [phone].pdf Page 2 of 8 End Time: 13:21 Procedural Pain: 0 Post Procedural Pain: 0 Response to Treatment: Procedure was tolerated well Level of Consciousness (Post- Awake and Alert procedure): Post Debridement Measurements of Total Wound Length: (cm) 1.5 Width: (cm) 0.7 Depth: (cm) 0.3 Volume: (cm) 0.247 Character of Wound/Ulcer Post Debridement: Improved Severity of Tissue Post Debridement: Fat layer exposed Post Procedure Diagnosis Same as Pre-procedure Electronic Signature(s) Signed: 11/20/2023 6:05:15 PM By: Allen Derry PA-C Signed: 11/21/2023 8:18:57 AM By: Yevonne Pax RN Entered By: Yevonne Pax on 11/19/2023 13:19:31 -------------------------------------------------------------------------------- HPI Details Patient Name: Date of Service: Dale [name] 11/19/2023 12:45 PM Medical Record Number: [id number] Patient Account Number: [id number] Date of Birth/Sex: Treating RN: 06-24-1961 (63 y.o. Lina Sar, Lyla Son Primary Care Provider: [name] Other Clinician: Referring Provider: Treating Provider/Extender: Tiburcio Pea in Treatment: 3 History of Present Illness Chronic/Inactive Conditions Condition 1: 10-23-2023 patient had ABIs which were performed on 09-15-2023 and showed that he apparently has a right ABI of 1.15 with a TBI of 0.98 and on the left an ABI of 1.13 with a TBI of 0.65. HPI Description: 10-23-2023 upon evaluation today patient appears for initial inspection here in our clinic concerning issues that has been having with a right great toe ulcer. He has had a previous hammertoe correction and the x-ray I did review. Patient's x-ray shows that he has unchanged alignment of the first metatarsophalangeal joint following arthrodesis and internal fixation. This was performed on 10-16-2023 there was no evidence  noted for osteomyelitis. With that being said he is having a wound here that just does not seem to want to heal. Has been using Medihoney at this point he has a lot of necrotic tissue there is muscle exposed at this point. Fortunately I do not see any signs of systemic infection which is great news. With that being said the patient does have some issue here with what appears to be local infection and I discussed that with him today as well. I think we  may want to put him on Bactrim just to get something started until we get the results of the PCR culture back. Patient does have a history of hypertension, diabetes mellitus type 2, diabetic neuropathy, and again this toe ulcer that he currently is dealing with. 12/30; since the patient was last here he had a fall while he was at [facility] and the forefoot off loader. Since then he has been back in his regular shoes. He comes in with the area on the right plantar first toe dull looking with thick black callus around the edges. He also had a new area identified on the right third toe plantar medially The patient reminds me he has had a first toe fixation apparently has hardware and screws in here. This was apparently recently done. 11-12-23 upon evaluation today patient appears to be doing excellent in regard to his wound. He has been tolerating the dressing changes without complication. This actually showing signs of significant improvement which is great news and in general I do believe that we will make an excellent headway here towards closure. 11-19-23 upon evaluation today patient appears to be doing well currently in regard to his toe ulcer. This is actually showing signs of significant improvement I am actually very pleased with where we stand. I do not see any evidence of worsening and I feel the wound is measuring smaller and looking much better this is great news. Electronic Signature(s) Signed: 11/19/2023 1:44:40 PM By: Lemont Fillers,  Mickie Hillier ([id number]) [id number] Page 3 of 8 Entered By: Allen Derry on 11/19/2023 13:44:40 -------------------------------------------------------------------------------- Physical Exam Details Patient Name: Date of Service: Dale [name] 11/19/2023 12:45 PM Medical Record Number: [id number] Patient Account Number: [id number] Date of Birth/Sex: Treating RN: 1960/12/10 (62 y.o. Melonie [place] Primary Care Provider: [name] Other Clinician: Referring Provider: Treating Provider/Extender: Tiburcio Pea in Treatment: 3 Constitutional Well-nourished and well-hydrated in no acute distress. Respiratory normal breathing without difficulty. Psychiatric this patient is able to make decisions and demonstrates good insight into disease process. Alert and Oriented x 3. pleasant and cooperative. Notes Upon inspection patient's wound bed actually showed signs of good granulation epithelization at this point. Fortunately I do not see any signs of active infection or worsening in general and I do believe that the patient is making excellent headway here towards closure. Electronic Signature(s) Signed: 11/19/2023 1:44:55 PM By: Allen Derry PA-C Entered By: Allen Derry on 11/19/2023 13:44:55 -------------------------------------------------------------------------------- Physician Orders Details Patient Name: Date of Service: HARVIN, MCCULLY 11/19/2023 12:45 PM Medical Record Number: [id number] Patient Account Number: [id number] Date of Birth/Sex: Treating RN: [birth year] (62 y.o. 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 Bathing/ Shower/ Hygiene May shower; gently cleanse wound with antibacterial soap, rinse and pat dry prior to dressing wounds - Shower with dressing on and at end take dressing off clean wound, recommended use dial original gold soap, dry and apply new dressing May shower with  wound dressing protected with water repellent cover or cast protector. - only needed on days not changing dressing No tub bath. Anesthetic (Use 'Patient Medications' Section for Anesthetic Order Entry) Lidocaine applied to wound bed Edema Control - Orders / Instructions Elevate, Exercise Daily and A void Standing for Long Periods of Time. Elevate legs to the level of the heart and pump ankles as often as possible Elevate leg(s) parallel to the floor when sitting. DO YOUR BEST to sleep in the bed at night. DO NOT sleep in your recliner. Long hours of sitting in a recliner leads to swelling of the legs and/or potential wounds on your backside. Off-Loading Other: - front offloading surg shoe provided, pt advised to wear when up on feet except for when he driving Medications-Please add to medication list. ntibiotics - Please pick up and start taking as prescribed P.O. A Wound Treatment Wound #1 - T Great oe Wound Laterality: Right Cleanser: Soap and Water 3 x Per Week/15 Days Discharge Instructions: Gently cleanse wound with antibacterial soap, rinse and pat dry prior to dressing wounds Cleanser: Wound Cleanser 3 x Per Week/15 Days Discharge Instructions: Wash your hands with soap and water. Remove old dressing,  discard into plastic bag and place into trash. Cleanse the wound with Wound Cleanser prior to applying a clean dressing using gauze sponges, not tissues or cotton balls. Do not scrub or use excessive force. Pat dry using gauze sponges, not tissue or cotton balls. Topical: Gentamicin 3 x Per Week/15 Days Discharge Instructions: Apply as directed by provider. Prim Dressing: Hydrofera Blue Ready Transfer Foam, 2.5x2.5 (in/in) 3 x Per Week/15 Days ary Discharge Instructions: Apply Hydrofera Blue Ready to wound bed as directed Secondary Dressing: Gauze 3 x Per Week/15 Days Discharge Instructions: As directed: dry Secured With: Stretch Net Dressing, Latex-free, Size 5, Small-Head / Shoulder / Thigh 3 x Per Week/15 Days Discharge Instructions: size 2 used Electronic Signature(s) Signed: 11/20/2023 6:05:15 PM By: Allen Derry PA-C Signed: 11/21/2023 8:18:57 AM By: Yevonne Pax RN Entered By: Yevonne Pax on 11/19/2023 13:18:13 -------------------------------------------------------------------------------- Problem List Details Patient Name: Date of Service: Dale [name] 11/19/2023 12:45 PM Medical Record Number: [id number] Patient Account Number: [id number] Date of Birth/Sex: Treating RN: 05/27/1961 (62 y.o. 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 Melonie [place] Primary Care Provider: [name] Other Clinician: Referring Provider: Treating Provider/Extender: Tiburcio Pea in Treatment: 3 Subjective Chief Complaint Information obtained from Patient Right great toe ulcer History of Present Illness (HPI) Chronic/Inactive Condition: 10-23-2023 patient had ABIs which were performed on 09-15-2023 and showed that he apparently has a right ABI of 1.15 with a TBI of 0.98 and on the left an ABI of 1.13 with  a TBI of 0.65. 10-23-2023 upon evaluation today patient appears for initial inspection here in our clinic concerning issues that has been having with a right great toe ulcer. He has had a previous hammertoe correction and the x-ray I did review. Patient's x-ray shows that he has unchanged alignment of the first metatarsophalangeal joint following arthrodesis and internal fixation. This was performed on 10-16-2023 there was no evidence noted for osteomyelitis. With that being said he is having a wound here that just does not seem to want to heal. Has been using Medihoney at this point he has a lot of necrotic tissue there is muscle exposed at this point. Fortunately I do not see any signs of systemic infection which is great news. With that being said the patient does have some issue here with what appears to be local infection and I discussed that with him today as well. I think we may want to put him on Bactrim just to get something started until we get the results of the PCR  culture back. Patient does have a history of hypertension, diabetes mellitus type 2, diabetic neuropathy, and again this toe ulcer that he currently is dealing with. 12/30; since the patient was last here he had a fall while he was at [facility] and the forefoot off loader. Since then he has been back in his regular shoes. He ALFONSA, TISDEL ([id number]) [phone].pdf Page 6 of 8 comes in with the area on the right plantar first toe dull looking with thick black callus around the edges. He also had a new area identified on the right third toe plantar medially The patient reminds me he has had a first toe fixation apparently has hardware and screws in here. This was apparently recently done. 11-12-23 upon evaluation today patient appears to be doing excellent in regard to his wound. He has been tolerating the dressing changes without complication. This actually showing signs of significant improvement which is great news and in general I do believe that we will make an excellent headway here towards closure. 11-19-23 upon evaluation today patient appears to be doing well currently in regard to his toe ulcer. This is actually showing signs of significant improvement I am actually very pleased with where we stand. I do not see any evidence of worsening and I feel the wound is measuring smaller and looking much better this is great news. Objective Constitutional Well-nourished and well-hydrated in no acute distress. Vitals Time Taken: 12:46 PM, Height: 72 in, Weight: 182 lbs, BMI: 24.7, Temperature: 97.6 F, Pulse: 69 bpm, Respiratory Rate: 18 breaths/min, Blood Pressure: 131/81 mmHg. Respiratory normal breathing without difficulty. Psychiatric this patient is able to make decisions and demonstrates good insight into disease process. Alert and Oriented x 3. pleasant and cooperative. General Notes: Upon inspection patient's wound bed actually showed signs of good granulation  epithelization at this point. Fortunately I do not see any signs of active infection or worsening in general and I do believe that the patient is making excellent headway here towards closure. Integumentary (Hair, Skin) Wound #1 status is Open. Original cause of wound was Gradually Appeared. The date acquired was: 06/05/2023. The wound has been in treatment 3 weeks. The wound is located on the Right T Great. The wound measures 1.5cm length x 0.7cm width x 0.3cm depth; 0.825cm^2 area and 0.247cm^3 volume. There is Fat oe Layer (Subcutaneous Tissue) exposed. There is no tunneling or undermining noted. There is a medium amount of serosanguineous drainage noted. There is medium (  34-66%) red, pink granulation within the wound bed. There is a medium (34-66%) amount of necrotic tissue within the wound bed including Adherent Slough. 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 There was a Excisional Skin/Subcutaneous Tissue Debridement with a total area of 0.82 sq cm performed by Allen Derry, PA-C. With the following instrument(s): Curette to remove Viable and Non-Viable tissue/material. Material removed includes Callus, Subcutaneous Tissue, Slough, Skin: Dermis, Skin: Epidermis, and Biofilm. No specimens were taken. A time out was conducted at 13:15, prior to the start of the procedure. A Moderate amount of bleeding was controlled with Pressure. The procedure was tolerated well with a pain level of 0 throughout and a pain level of 0 following the procedure. Post Debridement Measurements: 1.5cm length x  0.7cm width x 0.3cm depth; 0.247cm^3 volume. Character of Wound/Ulcer Post Debridement is improved. Severity of Tissue Post Debridement is: Fat layer exposed. Post procedure Diagnosis Wound #1: Same as Pre-Procedure Plan Follow-up Appointments: Return Appointment in 1 week. Bathing/ Shower/ Hygiene: May shower; gently cleanse wound with antibacterial soap, rinse and pat dry prior to dressing wounds - Shower with dressing on and at end take dressing off DORYAN, SWINDALL ([id number]) [phone].pdf Page 7 of 8 clean wound, recommended use dial original gold soap, dry and apply new dressing May shower with wound dressing protected with water repellent cover or cast protector. - only needed on days not changing dressing No tub bath. Anesthetic (Use 'Patient Medications' Section for Anesthetic Order Entry): Lidocaine applied to wound bed Edema Control - Orders / Instructions: Elevate, Exercise Daily and Avoid Standing for Long Periods of Time. Elevate legs to the level of the heart and pump ankles as often as possible Elevate leg(s) parallel to the floor when sitting. DO YOUR BEST to sleep in the bed at night. DO NOT sleep in your recliner. Long hours of sitting in a recliner leads to swelling of the legs and/or potential wounds on your backside. Off-Loading: Other: - front offloading surg shoe provided, pt advised to wear when up on feet except for when he driving Medications-Please add to medication list.: P.O. Antibiotics - Please pick up and start taking as prescribed WOUND #1: - T Great Wound Laterality: Right oe Cleanser: Soap and Water 3 x Per Week/15 Days Discharge Instructions: Gently cleanse wound with antibacterial soap, rinse and pat dry prior to dressing wounds Cleanser: Wound Cleanser 3 x Per Week/15 Days Discharge Instructions: Wash your hands with soap and water. Remove old dressing, discard into plastic bag and place into trash. Cleanse the wound  with Wound Cleanser prior to applying a clean dressing using gauze sponges, not tissues or cotton balls. Do not scrub or use excessive force. Pat dry using gauze sponges, not tissue or cotton balls. Topical: Gentamicin 3 x Per Week/15 Days Discharge Instructions: Apply as directed by provider. Prim Dressing: Hydrofera Blue Ready Transfer Foam, 2.5x2.5 (in/in) 3 x Per Week/15 Days ary Discharge Instructions: Apply Hydrofera Blue Ready to wound bed as directed Secondary Dressing: Gauze 3 x Per Week/15 Days Discharge Instructions: As directed: dry Secured With: Stretch Net Dressing, Latex-free, Size 5, Small-Head / Shoulder / Thigh 3 x Per  Week/15 Days Discharge Instructions: size 2 used 1. I am going to recommend as well that the patient should continue to monitor for any signs of infection or worsening. I do believe that the gentamicin is doing well followed by the [facility]. 2. Also can recommend the patient should continue with the stretch net to hold the roll gauze in place. 3. I am also going to have the patient continue to use the front offloading shoe which I think is doing well for him I think he is making less callus this is great news. We will see patient back for reevaluation in 1 week here in the clinic. If anything worsens or changes patient will contact our office for additional recommendations. Electronic Signature(s) Signed: 11/19/2023 1:45:28 PM By: Allen Derry PA-C Entered By: Allen Derry on 11/19/2023 13:45:28 -------------------------------------------------------------------------------- SuperBill Details Patient Name: Date of Service: Dale [name] 11/19/2023 Medical Record Number: [id number] Patient Account Number: [id number] Date of Birth/Sex: Treating RN: 09/18/1961 (62 y.o. 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 I10 Essential (primary) hypertension Facility Procedures LAURY, TENNYSON ([id number]): CPT4 Code Description [id number] [phone] - DEB SUBQ TISSUE 20 SQ CM/< ICD-10 Diagnosis Description L97.513 Non-pressure chronic ulcer of other part of right foot with necro [phone].pdf Page 8 of 8: Modifier Quantity 1 sis of muscle Physician Procedures : CPT4 Code Description Modifier 11042 11042 - WC PHYS SUBQ TISS 20 SQ CM ICD-10 Diagnosis Description L97.513 Non-pressure chronic ulcer of other part of right foot with necrosis of muscle Quantity: 1 Electronic Signature(s) Signed: 11/19/2023 1:45:54 PM By: Allen Derry PA-C Entered By: Allen Derry on 11/19/2023 13:45:54
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
## 2023-11-24 ENCOUNTER — Other Ambulatory Visit: Payer: Medicare PPO

## 2023-11-24 ENCOUNTER — Telehealth: Payer: Self-pay | Admitting: Pharmacist

## 2023-11-24 NOTE — Telephone Encounter (Signed)
   Outreach Note  11/24/2023 Name: Cloys Cort. MRN: [id number] DOB: 1961/03/11  Referred by: Smitty Cords, DO Reason for referral : No chief complaint on file.   Was unable to reach patient via telephone today and have left HIPAA compliant voicemail asking patient to return my call.    Follow Up Plan: Will attempt to reach patient by telephone again within the next 30 days   Estelle Grumbles, PharmD, [facility] Clinical Pharmacist [facility] [phone]

## 2023-12-01 ENCOUNTER — Other Ambulatory Visit: Payer: Self-pay | Admitting: Family Medicine

## 2023-12-01 DIAGNOSIS — E785 Hyperlipidemia, unspecified: Secondary | ICD-10-CM

## 2023-12-02 ENCOUNTER — Telehealth: Payer: Self-pay | Admitting: Family Medicine

## 2023-12-02 DIAGNOSIS — E1140 Type 2 diabetes mellitus with diabetic neuropathy, unspecified: Secondary | ICD-10-CM

## 2023-12-02 NOTE — Telephone Encounter (Signed)
Patient requesting a 30 day supply instead of 90.

## 2023-12-02 NOTE — Telephone Encounter (Signed)
Medication Refill -  Most Recent Primary Care Visit:  Provider: Estelle Grumbles A  Department: [facility]  Visit Type: PATIENT OUTREACH 30  Date: 10/06/2023  Medication: OZEMPIC, 1 MG/DOSE, 4 MG/3ML SOPN  Requesting one month supply instead of 90day.  Has the patient contacted their pharmacy? Yes Contact office.   Is this the correct pharmacy for this prescription? Yes This is the patient's preferred pharmacy:  [facility] [phone] Dan Humphreys, [place] - [address] [place] [address] Phone: [phone] Fax: [phone]   Has the prescription been filled recently? No  Is the patient out of the medication? Yes  Has the patient been seen for an appointment in the last year OR does the patient have an upcoming appointment? Yes  Can we respond through MyChart? Yes  Agent: Please be advised that Rx refills may take up to 3 business days. We ask that you follow-up with your pharmacy.

## 2023-12-03 ENCOUNTER — Ambulatory Visit: Payer: Medicare Other | Admitting: Physician Assistant

## 2023-12-03 ENCOUNTER — Other Ambulatory Visit: Payer: Self-pay | Admitting: Family Medicine

## 2023-12-03 DIAGNOSIS — E1140 Type 2 diabetes mellitus with diabetic neuropathy, unspecified: Secondary | ICD-10-CM

## 2023-12-03 NOTE — Telephone Encounter (Signed)
Rx 08/27/23 #90 1RF- too soon Requested Prescriptions  Pending Prescriptions Disp Refills   atorvastatin (LIPITOR) 10 MG tablet [Pharmacy Med Name: ATORVASTATIN 10 MG TABLET] 90 tablet 1    Sig: TAKE 1 TABLET BY MOUTH EVERY DAY     Cardiovascular:  Antilipid - Statins Failed - 12/03/2023  3:48 PM      Failed - Lipid Panel in normal range within the last 12 months    Cholesterol, Total  Date Value Ref Range Status  06/26/2015 104 100 - 199 mg/dL Final   Cholesterol  Date Value Ref Range Status  08/22/2023 108 <200 mg/dL Final   LDL Cholesterol (Calc)  Date Value Ref Range Status  08/22/2023 46 mg/dL (calc) Final    Comment:    Reference range: <100 . Desirable range <100 mg/dL for primary prevention;   <70 mg/dL for patients with CHD or diabetic patients  with > or = 2 CHD risk factors. Marland Kitchen LDL-C is now calculated using the Martin-Hopkins  calculation, which is a validated novel method providing  better accuracy than the Friedewald equation in the  estimation of LDL-C.  Horald Pollen et al. Lenox Ahr. 4098;119(14): 2061-2068  (http://education.[employer].com/faq/FAQ164)    HDL  Date Value Ref Range Status  08/22/2023 41 > OR = 40 mg/dL Final  78/29/5621 30 (L) >39 mg/dL Final    Comment:    According to ATP-III Guidelines, HDL-C >59 mg/dL is considered a negative risk factor for CHD.    Triglycerides  Date Value Ref Range Status  08/22/2023 125 <150 mg/dL Final         Passed - Patient is not pregnant      Passed - Valid encounter within last 12 months    Recent Outpatient Visits           3 months ago Annual physical exam   [facility] [place], Netta Neat, DO   4 months ago Diabetic ulcer of toe of right foot associated with type 2 diabetes mellitus, with fat layer exposed [facility])   [place] [facility] [place], Salvadore [place], NP   5 months ago Essential (primary) hypertension   [facility]  Delles, Gentry Fitz A, RPH-CPP   5 months ago Essential (primary) hypertension   [facility] Delles, Gentry Fitz A, RPH-CPP   8 months ago Hyperlipidemia associated with type 2 diabetes mellitus [facility])    [facility] Delles, Jackelyn Poling, RPH-CPP       Future Appointments             In 9 months Stoioff, Verna Czech, MD [facility] Urology [facility]

## 2023-12-04 ENCOUNTER — Encounter: Payer: Medicare Other | Admitting: Physician Assistant

## 2023-12-04 DIAGNOSIS — L97512 Non-pressure chronic ulcer of other part of right foot with fat layer exposed: Secondary | ICD-10-CM | POA: Diagnosis not present

## 2023-12-04 DIAGNOSIS — M5412 Radiculopathy, cervical region: Secondary | ICD-10-CM | POA: Diagnosis not present

## 2023-12-04 DIAGNOSIS — E11621 Type 2 diabetes mellitus with foot ulcer: Secondary | ICD-10-CM | POA: Diagnosis not present

## 2023-12-04 DIAGNOSIS — L97513 Non-pressure chronic ulcer of other part of right foot with necrosis of muscle: Secondary | ICD-10-CM | POA: Diagnosis not present

## 2023-12-04 DIAGNOSIS — E1143 Type 2 diabetes mellitus with diabetic autonomic (poly)neuropathy: Secondary | ICD-10-CM | POA: Diagnosis not present

## 2023-12-04 DIAGNOSIS — I10 Essential (primary) hypertension: Secondary | ICD-10-CM | POA: Diagnosis not present

## 2023-12-04 DIAGNOSIS — M961 Postlaminectomy syndrome, not elsewhere classified: Secondary | ICD-10-CM | POA: Diagnosis not present

## 2023-12-04 DIAGNOSIS — M170 Bilateral primary osteoarthritis of knee: Secondary | ICD-10-CM | POA: Diagnosis not present

## 2023-12-04 DIAGNOSIS — H409 Unspecified glaucoma: Secondary | ICD-10-CM | POA: Diagnosis not present

## 2023-12-04 DIAGNOSIS — I2510 Atherosclerotic heart disease of native coronary artery without angina pectoris: Secondary | ICD-10-CM | POA: Diagnosis not present

## 2023-12-04 NOTE — Telephone Encounter (Signed)
Patient should be on PAP for Ozempic correct? I see note about application in December 2024. Last call I see was not successful.  Would you be able to assist with updating his status for this - or should we go ahead and order based on this refill request?  Thank you!  [name], DO [facility] 12/04/2023, 1:35 PM

## 2023-12-04 NOTE — Telephone Encounter (Signed)
Requested Prescriptions  Pending Prescriptions Disp Refills   OZEMPIC, 1 MG/DOSE, 4 MG/3ML SOPN [Pharmacy Med Name: OZEMPIC 4 MG/3 ML (1 MG/DOSE)] 9 mL 1    Sig: INJECT 1MG  INTO THE SKIN ONCE A WEEK     Endocrinology:  Diabetes - GLP-1 Receptor Agonists - semaglutide Passed - 12/04/2023  3:51 PM      Passed - HBA1C in normal range and within 180 days    Hgb A1c MFr Bld  Date Value Ref Range Status  08/22/2023 5.6 <5.7 % of total Hgb Final    Comment:    For the purpose of screening for the presence of diabetes: . <5.7%       Consistent with the absence of diabetes 5.7-6.4%    Consistent with increased risk for diabetes             (prediabetes) > or =6.5%  Consistent with diabetes . This assay result is consistent with a decreased risk of diabetes. . Currently, no consensus exists regarding use of hemoglobin A1c for diagnosis of diabetes in children. . According to American Diabetes Association (ADA) guidelines, hemoglobin A1c <7.0% represents optimal control in non-pregnant diabetic patients. Different metrics may apply to specific patient populations.  Standards of Medical Care in Diabetes(ADA). .          Passed - Cr in normal range and within 360 days    Creat  Date Value Ref Range Status  08/22/2023 0.74 0.70 - 1.35 mg/dL Final   Creatinine, Urine  Date Value Ref Range Status  08/29/2023 132 20 - 320 mg/dL Final         Passed - Valid encounter within last 6 months    Recent Outpatient Visits           3 months ago Annual physical exam   [place] [facility] [place], Netta Neat, DO   4 months ago Diabetic ulcer of toe of right foot associated with type 2 diabetes mellitus, with fat layer exposed [facility])   [facility] [place], Salvadore Oxford, NP   5 months ago Essential (primary) hypertension   [facility] Delles, Gentry Fitz A, RPH-CPP   5 months ago Essential (primary) hypertension   [facility] Delles, Gentry Fitz A, RPH-CPP   8 months ago Hyperlipidemia associated with type 2 diabetes mellitus [facility])   [facility] Delles, Jackelyn Poling, RPH-CPP       Future Appointments             In 9 months Stoioff, Verna Czech, MD [facility] Urology [place]

## 2023-12-04 NOTE — Telephone Encounter (Signed)
Requested medication (s) are due for refill today: yes  Requested medication (s) are on the active medication list: yes  Last refill:  12/17/22 9 ml 1 RF  Future visit scheduled: yes  Notes to clinic:  Ozempic not addressed in last OV-    Requested Prescriptions  Pending Prescriptions Disp Refills   OZEMPIC, 1 MG/DOSE, 4 MG/3ML SOPN 9 mL 1    Sig: Inject 1 mg into the skin once a week.     Endocrinology:  Diabetes - GLP-1 Receptor Agonists - semaglutide Passed - 12/04/2023 12:44 PM      Passed - HBA1C in normal range and within 180 days    Hgb A1c MFr Bld  Date Value Ref Range Status  08/22/2023 5.6 <5.7 % of total Hgb Final    Comment:    For the purpose of screening for the presence of diabetes: . <5.7%       Consistent with the absence of diabetes 5.7-6.4%    Consistent with increased risk for diabetes             (prediabetes) > or =6.5%  Consistent with diabetes . This assay result is consistent with a decreased risk of diabetes. . Currently, no consensus exists regarding use of hemoglobin A1c for diagnosis of diabetes in children. . According to American Diabetes Association (ADA) guidelines, hemoglobin A1c <7.0% represents optimal control in non-pregnant diabetic patients. Different metrics may apply to specific patient populations.  Standards of Medical Care in Diabetes(ADA). .          Passed - Cr in normal range and within 360 days    Creat  Date Value Ref Range Status  08/22/2023 0.74 0.70 - 1.35 mg/dL Final   Creatinine, Urine  Date Value Ref Range Status  08/29/2023 132 20 - 320 mg/dL Final         Passed - Valid encounter within last 6 months    Recent Outpatient Visits           3 months ago Annual physical exam   [place] [facility] [place] Antonia, Netta Neat, DO   4 months ago Diabetic ulcer of toe of right foot associated with type 2 diabetes mellitus, with fat layer exposed [facility])   [facility] [place], Salvadore [place], NP   5 months ago Essential (primary) hypertension   [facility] Delles, Gentry Fitz A, RPH-CPP   5 months ago Essential (primary) hypertension   [place] [facility] Delles, Gentry Fitz A, RPH-CPP   8 months ago Hyperlipidemia associated with type 2 diabetes mellitus [facility])   [place] [facility] Delles, Jackelyn Poling, RPH-CPP       Future Appointments             In 9 months Stoioff, Verna Czech, MD [facility] [place]

## 2023-12-10 ENCOUNTER — Encounter: Payer: Medicare Other | Attending: Physician Assistant | Admitting: Physician Assistant

## 2023-12-10 DIAGNOSIS — L97511 Non-pressure chronic ulcer of other part of right foot limited to breakdown of skin: Secondary | ICD-10-CM | POA: Diagnosis not present

## 2023-12-10 DIAGNOSIS — L97513 Non-pressure chronic ulcer of other part of right foot with necrosis of muscle: Secondary | ICD-10-CM | POA: Insufficient documentation

## 2023-12-10 DIAGNOSIS — I10 Essential (primary) hypertension: Secondary | ICD-10-CM | POA: Diagnosis not present

## 2023-12-10 DIAGNOSIS — E1143 Type 2 diabetes mellitus with diabetic autonomic (poly)neuropathy: Secondary | ICD-10-CM | POA: Insufficient documentation

## 2023-12-10 DIAGNOSIS — E11621 Type 2 diabetes mellitus with foot ulcer: Secondary | ICD-10-CM | POA: Diagnosis not present

## 2023-12-11 ENCOUNTER — Ambulatory Visit: Payer: Medicare Other | Admitting: Physician Assistant

## 2023-12-18 ENCOUNTER — Encounter: Payer: Medicare Other | Admitting: Physician Assistant

## 2023-12-18 DIAGNOSIS — E1143 Type 2 diabetes mellitus with diabetic autonomic (poly)neuropathy: Secondary | ICD-10-CM | POA: Diagnosis not present

## 2023-12-18 DIAGNOSIS — E08621 Diabetes mellitus due to underlying condition with foot ulcer: Secondary | ICD-10-CM | POA: Diagnosis not present

## 2023-12-18 DIAGNOSIS — L97512 Non-pressure chronic ulcer of other part of right foot with fat layer exposed: Secondary | ICD-10-CM | POA: Diagnosis not present

## 2023-12-18 DIAGNOSIS — L97513 Non-pressure chronic ulcer of other part of right foot with necrosis of muscle: Secondary | ICD-10-CM | POA: Diagnosis not present

## 2023-12-18 DIAGNOSIS — L97519 Non-pressure chronic ulcer of other part of right foot with unspecified severity: Secondary | ICD-10-CM | POA: Diagnosis not present

## 2023-12-18 DIAGNOSIS — Z09 Encounter for follow-up examination after completed treatment for conditions other than malignant neoplasm: Secondary | ICD-10-CM | POA: Diagnosis not present

## 2023-12-18 DIAGNOSIS — E11621 Type 2 diabetes mellitus with foot ulcer: Secondary | ICD-10-CM | POA: Diagnosis not present

## 2023-12-18 DIAGNOSIS — M79671 Pain in right foot: Secondary | ICD-10-CM | POA: Diagnosis not present

## 2023-12-18 DIAGNOSIS — M205X1 Other deformities of toe(s) (acquired), right foot: Secondary | ICD-10-CM | POA: Diagnosis not present

## 2023-12-18 DIAGNOSIS — M2021 Hallux rigidus, right foot: Secondary | ICD-10-CM | POA: Diagnosis not present

## 2023-12-18 DIAGNOSIS — I10 Essential (primary) hypertension: Secondary | ICD-10-CM | POA: Diagnosis not present

## 2023-12-19 ENCOUNTER — Other Ambulatory Visit: Payer: Self-pay

## 2023-12-19 ENCOUNTER — Telehealth: Payer: Self-pay | Admitting: Pharmacist

## 2023-12-19 DIAGNOSIS — G894 Chronic pain syndrome: Secondary | ICD-10-CM | POA: Diagnosis not present

## 2023-12-19 DIAGNOSIS — M170 Bilateral primary osteoarthritis of knee: Secondary | ICD-10-CM | POA: Diagnosis not present

## 2023-12-19 NOTE — Telephone Encounter (Signed)
   Outreach Note  12/19/2023 Name: Dale [name]. MRN: [id number] DOB: December 21, 1960  Referred by: Smitty Cords, DO  Was unable to reach patient via telephone today and have left HIPAA compliant voicemail asking patient to return my call. Outreach attempt #2.  Follow Up Plan: Will collaborate with Care Guide to outreach to schedule follow up with me  Estelle Grumbles, PharmD, P[place]y [place], CPP Clinical Pharmacist [facility] [phone]

## 2023-12-30 ENCOUNTER — Encounter: Payer: Medicare Other | Admitting: Physician Assistant

## 2023-12-30 DIAGNOSIS — I10 Essential (primary) hypertension: Secondary | ICD-10-CM | POA: Diagnosis not present

## 2023-12-30 DIAGNOSIS — E1143 Type 2 diabetes mellitus with diabetic autonomic (poly)neuropathy: Secondary | ICD-10-CM | POA: Diagnosis not present

## 2023-12-30 DIAGNOSIS — L97513 Non-pressure chronic ulcer of other part of right foot with necrosis of muscle: Secondary | ICD-10-CM | POA: Diagnosis not present

## 2023-12-30 DIAGNOSIS — E11621 Type 2 diabetes mellitus with foot ulcer: Secondary | ICD-10-CM | POA: Diagnosis not present

## 2023-12-30 DIAGNOSIS — L97512 Non-pressure chronic ulcer of other part of right foot with fat layer exposed: Secondary | ICD-10-CM | POA: Diagnosis not present

## 2024-01-05 ENCOUNTER — Telehealth: Payer: Self-pay | Admitting: Pharmacy Technician

## 2024-01-05 DIAGNOSIS — Z5986 Financial insecurity: Secondary | ICD-10-CM

## 2024-01-05 NOTE — Progress Notes (Signed)
 Pharmacy Medication Assistance Program Note    01/05/2024  Patient ID: Dale [name]., male   DOB: 1961/08/14, 63 y.o.   MRN: [id number]     10/09/2023 01/05/2024  Outreach Medication One  Initial Outreach Date (Medication One) 10/08/2023   Manufacturer Medication One [employer] [facility] Ozempic   Dose of Ozempic 4mg /33ml   Type of Radiographer, therapeutic Assistance   Date Application Sent to Patient 10/08/2023   Application Items Requested Application;Proof of Income;Other   Date Application Sent to Prescriber 10/15/2023   Name of Prescriber [name]   Date Application Received From Patient  01/05/2024  Application Items Received From Patient  Application;Proof of Income;Other  Date Application Received From Provider  10/20/2023  Date Application Submitted to Manufacturer  01/05/2024  Method Application Sent to Manufacturer  Fax    [name], CPhT [place]  Office: [phone] Fax: [phone] Email: [email or web address]

## 2024-01-08 ENCOUNTER — Encounter: Payer: Medicare Other | Attending: Physician Assistant | Admitting: Physician Assistant

## 2024-01-08 DIAGNOSIS — I10 Essential (primary) hypertension: Secondary | ICD-10-CM | POA: Insufficient documentation

## 2024-01-08 DIAGNOSIS — E11621 Type 2 diabetes mellitus with foot ulcer: Secondary | ICD-10-CM | POA: Insufficient documentation

## 2024-01-08 DIAGNOSIS — E1143 Type 2 diabetes mellitus with diabetic autonomic (poly)neuropathy: Secondary | ICD-10-CM | POA: Insufficient documentation

## 2024-01-08 DIAGNOSIS — L97513 Non-pressure chronic ulcer of other part of right foot with necrosis of muscle: Secondary | ICD-10-CM | POA: Diagnosis not present

## 2024-01-08 DIAGNOSIS — L97512 Non-pressure chronic ulcer of other part of right foot with fat layer exposed: Secondary | ICD-10-CM | POA: Diagnosis not present

## 2024-01-12 ENCOUNTER — Telehealth: Payer: Self-pay | Admitting: Pharmacy Technician

## 2024-01-12 DIAGNOSIS — Z5986 Financial insecurity: Secondary | ICD-10-CM

## 2024-01-12 NOTE — Progress Notes (Signed)
 Pharmacy Medication Assistance Program Note    01/12/2024  Patient ID: Dale [name]., male   DOB: [birth year], 63 y.o.   MRN: [id number]     10/09/2023 01/05/2024 01/12/2024  Outreach Medication One  Initial Outreach Date (Medication One) 10/08/2023    Manufacturer Medication One [employer] [facility] Ozempic    Dose of Ozempic 4mg /78ml    Type of Radiographer, therapeutic Assistance    Date Application Sent to Patient 10/08/2023    Application Items Requested Application;Proof of Income;Other    Date Application Sent to Prescriber 10/15/2023    Name of Prescriber [name]    Date Application Received From Patient  01/05/2024   Application Items Received From Patient  Application;Proof of Income;Other   Date Application Received From Provider  10/20/2023   Date Application Submitted to Manufacturer  01/05/2024   Method Application Sent to Manufacturer  Fax   Patient Assistance Determination   Approved  Approval Start Date   01/12/2024  Approval End Date   11/03/2024  Additional Outreach Contact   Provider  Contacted Provider   Message    Care coordination call placed to [employer] in regard to [place] application.   Spoke to [place] who informs patient is APPROVED 01/12/24-11/03/24. Initial shipment and subsequent refill shipments will process automatically with delivery to the prescriber's office. Patient may call [employer] at any time to check on his shipments by calling [phone]. Will send in basket to [facility] Pharmacist Estelle Grumbles to notify patient at his next office visit with her as well as for case closure at patient assistance is completed.  [name], CPhT [place]  Office: [phone] Fax: [phone] Email: [email or web address]

## 2024-01-19 ENCOUNTER — Encounter: Attending: Physician Assistant | Admitting: Physician Assistant

## 2024-01-19 DIAGNOSIS — I10 Essential (primary) hypertension: Secondary | ICD-10-CM | POA: Diagnosis not present

## 2024-01-19 DIAGNOSIS — E1143 Type 2 diabetes mellitus with diabetic autonomic (poly)neuropathy: Secondary | ICD-10-CM | POA: Insufficient documentation

## 2024-01-19 DIAGNOSIS — E11621 Type 2 diabetes mellitus with foot ulcer: Secondary | ICD-10-CM | POA: Diagnosis not present

## 2024-01-19 DIAGNOSIS — L97512 Non-pressure chronic ulcer of other part of right foot with fat layer exposed: Secondary | ICD-10-CM | POA: Diagnosis not present

## 2024-01-19 DIAGNOSIS — L97513 Non-pressure chronic ulcer of other part of right foot with necrosis of muscle: Secondary | ICD-10-CM | POA: Insufficient documentation

## 2024-01-22 ENCOUNTER — Ambulatory Visit: Admitting: Certified Registered Nurse Anesthetist

## 2024-01-22 ENCOUNTER — Ambulatory Visit
Admission: RE | Admit: 2024-01-22 | Discharge: 2024-01-22 | Disposition: A | Discharge status: Home / Self Care | Payer: Medicare Other | Attending: Gastroenterology | Admitting: Gastroenterology

## 2024-01-22 ENCOUNTER — Encounter: Payer: Self-pay | Admitting: Gastroenterology

## 2024-01-22 ENCOUNTER — Encounter
Admission: RE | Disposition: A | Discharge status: Home / Self Care | Payer: Self-pay | Source: Home / Self Care | Attending: Gastroenterology

## 2024-01-22 ENCOUNTER — Other Ambulatory Visit: Payer: Self-pay

## 2024-01-22 DIAGNOSIS — Z7984 Long term (current) use of oral hypoglycemic drugs: Secondary | ICD-10-CM | POA: Insufficient documentation

## 2024-01-22 DIAGNOSIS — Z7982 Long term (current) use of aspirin: Secondary | ICD-10-CM | POA: Diagnosis not present

## 2024-01-22 DIAGNOSIS — K5730 Diverticulosis of large intestine without perforation or abscess without bleeding: Secondary | ICD-10-CM | POA: Diagnosis not present

## 2024-01-22 DIAGNOSIS — Z79899 Other long term (current) drug therapy: Secondary | ICD-10-CM | POA: Insufficient documentation

## 2024-01-22 DIAGNOSIS — F909 Attention-deficit hyperactivity disorder, unspecified type: Secondary | ICD-10-CM | POA: Insufficient documentation

## 2024-01-22 DIAGNOSIS — Z1211 Encounter for screening for malignant neoplasm of colon: Secondary | ICD-10-CM | POA: Diagnosis not present

## 2024-01-22 DIAGNOSIS — K640 First degree hemorrhoids: Secondary | ICD-10-CM | POA: Insufficient documentation

## 2024-01-22 DIAGNOSIS — I2510 Atherosclerotic heart disease of native coronary artery without angina pectoris: Secondary | ICD-10-CM | POA: Diagnosis not present

## 2024-01-22 DIAGNOSIS — K219 Gastro-esophageal reflux disease without esophagitis: Secondary | ICD-10-CM | POA: Insufficient documentation

## 2024-01-22 DIAGNOSIS — I10 Essential (primary) hypertension: Secondary | ICD-10-CM | POA: Insufficient documentation

## 2024-01-22 DIAGNOSIS — K635 Polyp of colon: Secondary | ICD-10-CM | POA: Diagnosis not present

## 2024-01-22 DIAGNOSIS — F32A Depression, unspecified: Secondary | ICD-10-CM | POA: Diagnosis not present

## 2024-01-22 DIAGNOSIS — Z87891 Personal history of nicotine dependence: Secondary | ICD-10-CM | POA: Insufficient documentation

## 2024-01-22 DIAGNOSIS — Z23 Encounter for immunization: Secondary | ICD-10-CM | POA: Diagnosis not present

## 2024-01-22 DIAGNOSIS — R519 Headache, unspecified: Secondary | ICD-10-CM | POA: Diagnosis not present

## 2024-01-22 DIAGNOSIS — F419 Anxiety disorder, unspecified: Secondary | ICD-10-CM | POA: Insufficient documentation

## 2024-01-22 DIAGNOSIS — E119 Type 2 diabetes mellitus without complications: Secondary | ICD-10-CM | POA: Insufficient documentation

## 2024-01-22 HISTORY — PX: COLONOSCOPY: SHX5424

## 2024-01-22 HISTORY — PX: POLYPECTOMY: SHX5525

## 2024-01-22 SURGERY — COLONOSCOPY
Anesthesia: General

## 2024-01-22 MED ORDER — PROPOFOL 1000 MG/100ML IV EMUL
INTRAVENOUS | Status: AC
  Filled 2024-01-22: qty 100

## 2024-01-22 MED ORDER — LIDOCAINE HCL (CARDIAC) PF 100 MG/5ML IV SOSY
PREFILLED_SYRINGE | INTRAVENOUS | Status: DC | PRN
  Administered 2024-01-22: 50 mg via INTRAVENOUS

## 2024-01-22 MED ORDER — SODIUM CHLORIDE 0.9 % IV SOLN
INTRAVENOUS | Status: DC | PRN
Start: 2024-01-22 — End: 2024-01-22

## 2024-01-22 MED ORDER — LIDOCAINE HCL (PF) 2 % IJ SOLN
INTRAMUSCULAR | Status: AC
  Filled 2024-01-22: qty 10

## 2024-01-22 MED ORDER — PROPOFOL 500 MG/50ML IV EMUL
INTRAVENOUS | Status: DC | PRN
  Administered 2024-01-22: 180 ug/kg/min via INTRAVENOUS
  Administered 2024-01-22: 80 mg via INTRAVENOUS
  Administered 2024-01-22: 20 mg via INTRAVENOUS

## 2024-01-22 MED ORDER — SODIUM CHLORIDE 0.9 % IV SOLN: INTRAVENOUS | Status: DC

## 2024-01-22 NOTE — Transfer of Care (Signed)
 Immediate Anesthesia Transfer of Care Note  Patient: Dale [name].  Procedure(s) Performed: COLONOSCOPY POLYPECTOMY  Patient Location: PACU and Endoscopy Unit  Anesthesia Type:General  Level of Consciousness: drowsy  Airway & Oxygen Therapy: Patient Spontanous Breathing  Post-op Assessment: Report given to RN and Post -op Vital signs reviewed and stable  Post vital signs: Reviewed and stable  Last Vitals:  Vitals Value Taken Time  BP 89/64 01/22/24 1056  Temp 35.8 C 01/22/24 1056  Pulse 70 01/22/24 1056  Resp 16 01/22/24 1056  SpO2 95 % 01/22/24 1056    Last Pain:  Vitals:   01/22/24 1056  TempSrc: Temporal  PainSc: Asleep         Complications: No notable events documented.

## 2024-01-22 NOTE — Op Note (Signed)
 [facility] Patient Name: Dale [name] Procedure Date: 01/22/2024 10:13 AM MRN: [id number] Account #: [id number] Date of Birth: 1960-12-05 Admit Type: Outpatient Age: 63 Room: [facility] ENDO ROOM 1 Gender: Male Note Status: Finalized Instrument Name: Colonscope [id number] Procedure:             Colonoscopy Indications:           Screening for colorectal malignant neoplasm Providers:             Jaynie Collins DO, DO Medicines:             Monitored Anesthesia Care Complications:         No immediate complications. Estimated blood loss:                         Minimal. Procedure:             Pre-Anesthesia Assessment:                        - Prior to the procedure, a History and Physical was                         performed, and patient medications and allergies were                         reviewed. The patient is competent. The risks and                         benefits of the procedure and the sedation options and                         risks were discussed with the patient. All questions                         were answered and informed consent was obtained.                         Patient identification and proposed procedure were                         verified by the physician, the nurse, the anesthetist                         and the technician in the endoscopy suite. Mental                         Status Examination: alert and oriented. Airway                         Examination: normal oropharyngeal airway and neck                         mobility. Respiratory Examination: clear to                         auscultation. CV Examination: RRR, no murmurs, no S3                         or S4. Prophylactic Antibiotics: The patient does not  require prophylactic antibiotics. Prior                         Anticoagulants: The patient has taken no anticoagulant                         or antiplatelet agents. ASA Grade  Assessment: II - A                         patient with mild systemic disease. After reviewing                         the risks and benefits, the patient was deemed in                         satisfactory condition to undergo the procedure. The                         anesthesia plan was to use monitored anesthesia care                         (MAC). Immediately prior to administration of                         medications, the patient was re-assessed for adequacy                         to receive sedatives. The heart rate, respiratory                         rate, oxygen saturations, blood pressure, adequacy of                         pulmonary ventilation, and response to care were                         monitored throughout the procedure. The physical                         status of the patient was re-assessed after the                         procedure.                        After obtaining informed consent, the colonoscope was                         passed under direct vision. Throughout the procedure,                         the patient's blood pressure, pulse, and oxygen                         saturations were monitored continuously. The                         Colonoscope was introduced through the anus and  advanced to the the terminal ileum, with                         identification of the appendiceal orifice and IC                         valve. The colonoscopy was performed without                         difficulty. The patient tolerated the procedure well.                         The quality of the bowel preparation was evaluated                         using the BBPS [facility] Bowel Preparation Scale) with                         scores of: Right Colon = 3, Transverse Colon = 3 and                         Left Colon = 3 (entire mucosa seen well with no                         residual staining, small fragments of stool or opaque                          liquid). The total BBPS score equals 9. The ileocecal                         valve, appendiceal orifice, and rectum were                         photographed. Findings:      The perianal and digital rectal examinations were normal. Pertinent       negatives include normal sphincter tone.      Scattered small-mouthed diverticula were found in the sigmoid colon and       ascending colon. Estimated blood loss: none.      Non-bleeding internal hemorrhoids were found during retroflexion. The       hemorrhoids were Grade I (internal hemorrhoids that do not prolapse).       Estimated blood loss: none.      A 1 to 2 mm polyp was found in the descending colon. The polyp was       sessile. The polyp was removed with a jumbo cold forceps. Resection and       retrieval were complete. Estimated blood loss was minimal.      The exam was otherwise without abnormality on direct and retroflexion       views. Impression:            - Diverticulosis in the sigmoid colon and in the                         ascending colon.                        - Non-bleeding internal hemorrhoids.                        -  One 1 to 2 mm polyp in the descending colon, removed                         with a jumbo cold forceps. Resected and retrieved.                        - The examination was otherwise normal on direct and                         retroflexion views. Recommendation:        - Patient has a contact number available for                         emergencies. The signs and symptoms of potential                         delayed complications were discussed with the patient.                         Return to normal activities tomorrow. Written                         discharge instructions were provided to the patient.                        - Discharge patient to home.                        - Resume previous diet.                        - Continue present medications.                        - Await  pathology results.                        - Repeat colonoscopy for surveillance based on                         pathology results.                        - Return to referring physician as previously                         scheduled.                        - The findings and recommendations were discussed with                         the patient. Procedure Code(s):     --- Professional ---                        [phone], Colonoscopy, flexible; with biopsy, single or                         multiple Diagnosis Code(s):     --- Professional ---  Z12.11, Encounter for screening for malignant neoplasm                         of colon                        K64.0, First degree hemorrhoids                        D12.4, Benign neoplasm of descending colon                        K57.30, Diverticulosis of large intestine without                         perforation or abscess without bleeding CPT copyright 2022 American Medical Association. All rights reserved. The codes documented in this report are preliminary and upon coder review may  be revised to meet current compliance requirements. Attending Participation:      I personally performed the entire procedure. Elfredia Nevins, DO Jaynie Collins DO, DO 01/22/2024 11:10:52 AM This report has been signed electronically. Number of Addenda: 0 Note Initiated On: 01/22/2024 10:13 AM Scope Withdrawal Time: 0 hours 12 minutes 13 seconds  Total Procedure Duration: 0 hours 22 minutes 25 seconds  Estimated Blood Loss:  Estimated blood loss was minimal.      [facility]

## 2024-01-22 NOTE — Interval H&P Note (Signed)
 History and Physical Interval Note: Preprocedure H&P from 01/22/24  was reviewed and there was no interval change after seeing and examining the patient.  Written consent was obtained from the patient after discussion of risks, benefits, and alternatives. Patient has consented to proceed with Colonoscopy with possible intervention   01/22/2024 10:22 AM  Dale [name].  has presented today for surgery, with the diagnosis of Colon cancer screening (Z12.11).  The various methods of treatment have been discussed with the patient and family. After consideration of risks, benefits and other options for treatment, the patient has consented to  Procedure(s) with comments: COLONOSCOPY (N/A) - DM as a surgical intervention.  The patient's history has been reviewed, patient examined, no change in status, stable for surgery.  I have reviewed the patient's chart and labs.  Questions were answered to the patient's satisfaction.     Jaynie Collins

## 2024-01-22 NOTE — Anesthesia Preprocedure Evaluation (Addendum)
 Anesthesia Evaluation  Patient identified by MRN, date of birth, ID band Patient awake    Reviewed: Allergy & Precautions, H&P , NPO status , Patient's Chart, lab work & pertinent test results  History of Anesthesia Complications Negative for: history of anesthetic complications  Airway Mallampati: III  TM Distance: <3 FB Neck ROM: limited    Dental  (+) Chipped, Poor Dentition, Missing, Dental Advidsory Given   Pulmonary neg shortness of breath, former smoker          Cardiovascular Exercise Tolerance: Good hypertension, (-) angina + CAD  (-) Past MI and (-) DOE + Valvular Problems/Murmurs      Neuro/Psych  Headaches PSYCHIATRIC DISORDERS Anxiety Depression     Neuromuscular disease    GI/Hepatic Neg liver ROS,GERD  Medicated and Controlled,,  Endo/Other  diabetes, Type 2    Renal/GU      Musculoskeletal   Abdominal   Peds  Hematology negative hematology ROS (+)   Anesthesia Other Findings Past Medical History: No date: ADHD (attention deficit hyperactivity disorder) No date: Anxiety No date: Coronary artery disease No date: Coronary artery disease No date: Coronary atherosclerosis No date: Depression No date: Diabetes mellitus without complication (HCC) No date: GERD (gastroesophageal reflux disease) No date: Glaucoma No date: Headache     Comment:  migrane No date: Heart disease No date: Hyperlipidemia No date: Hypertension No date: Hypertriglyceridemia No date: Hypogonadism in male No date: MVP (mitral valve prolapse) 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 21 2019 No date: SPINE SURGERY     Comment:  Multiple surgeries, initial 2001, thoracic disc repair               and recurrent revisions. No date: THROAT SURGERY  BMI    Body Mass Index:  31.38 kg/m       Reproductive/Obstetrics negative OB ROS                             Anesthesia Physical Anesthesia Plan  ASA: 3  Anesthesia Plan: General   Post-op Pain Management: Minimal or no pain anticipated   Induction: Intravenous  PONV Risk Score and Plan: 3 and Propofol infusion, TIVA and Ondansetron  Airway Management Planned: Nasal Cannula  Additional Equipment: None  Intra-op Plan:   Post-operative Plan:   Informed Consent: I have reviewed the patients History and Physical, chart, labs and discussed the procedure including the risks, benefits and alternatives for the proposed anesthesia with the patient or authorized representative who has indicated his/her understanding and acceptance.     Dental advisory given  Plan Discussed with: CRNA and Surgeon  Anesthesia Plan Comments: (Discussed risks of anesthesia with patient, including possibility of difficulty with spontaneous ventilation under anesthesia necessitating airway intervention, PONV, and rare risks such as cardiac or respiratory or neurological events, and allergic reactions. Discussed the role of CRNA in patient's perioperative care. Patient understands.)       Anesthesia Quick Evaluation

## 2024-01-22 NOTE — Anesthesia Postprocedure Evaluation (Signed)
 Anesthesia Post Note  Patient: Ferlin Fairhurst.  Procedure(s) Performed: COLONOSCOPY POLYPECTOMY  Patient location during evaluation: Endoscopy Anesthesia Type: General Level of consciousness: awake and alert Pain management: pain level controlled Vital Signs Assessment: post-procedure vital signs reviewed and stable Respiratory status: spontaneous breathing, nonlabored ventilation, respiratory function stable and patient connected to nasal cannula oxygen Cardiovascular status: blood pressure returned to baseline and stable Postop Assessment: no apparent nausea or vomiting Anesthetic complications: no  There were no known notable events for this encounter.   Last Vitals:  Vitals:   01/22/24 1106 01/22/24 1116  BP: 91/68 113/77  Pulse: 69 64  Resp: 17 17  Temp:    SpO2: 96% 99%    Last Pain:  Vitals:   01/22/24 1106  TempSrc:   PainSc: 0-No pain                 Stephanie Coup

## 2024-01-22 NOTE — H&P (Signed)
 Pre-Procedure H&P   Patient ID: Dale [name]. is a 63 y.o. male.  Gastroenterology Provider: Jaynie Collins, DO  Referring Provider: Dr. Althea Charon PCP: Smitty Cords, DO  Date: 01/22/2024  HPI Mr. Dale [name]. is a 63 y.o. male who presents today for Colonoscopy for colorectal cancer screening .  Last colonoscopy in January 2020 demonstrating hyperplastic polyps and internal hemorrhoids.  No family history of colon cancer or colon polyps. Reports regular bowel movements without melena or hematochezia  Patient has not taken his Ozempic since February.  He no longer has a intra thecal pump  Creatinine 0.9 hemoglobin 15.8 MCV 86 platelets 226,000   Past Medical History:  Diagnosis Date   ADHD (attention deficit hyperactivity disorder)    Anxiety    Coronary artery disease    Coronary artery disease    Coronary atherosclerosis    Depression    GERD (gastroesophageal reflux disease)    Headache    migrane   Heart disease    Hyperlipidemia    Hypertension    Hypertriglyceridemia    Hypogonadism in male    MVP (mitral valve prolapse)    Narcolepsy and cataplexy    Non-alcoholic fatty liver disease     Past Surgical History:  Procedure Laterality Date   COLONOSCOPY WITH PROPOFOL N/A 11/18/2018   Procedure: COLONOSCOPY WITH PROPOFOL;  Surgeon: Scot Jun, MD;  Location: [facility] ENDOSCOPY;  Service: Endoscopy;  Laterality: N/A;   ESOPHAGOGASTRODUODENOSCOPY (EGD) WITH PROPOFOL N/A 11/18/2018   Procedure: ESOPHAGOGASTRODUODENOSCOPY (EGD) WITH PROPOFOL;  Surgeon: Scot Jun, MD;  Location: [facility] ENDOSCOPY;  Service: Endoscopy;  Laterality: N/A;   pain pump implant and removal Bilateral    Dec. 21 2019   SPINE SURGERY     Multiple surgeries, initial 2001, thoracic disc repair and recurrent revisions.   THROAT SURGERY      Family History No h/o GI disease or malignancy  Review of Systems  Constitutional:  Negative for activity  change, appetite change, chills, diaphoresis, fatigue, fever and unexpected weight change.  HENT:  Negative for trouble swallowing and voice change.   Respiratory:  Negative for shortness of breath and wheezing.   Cardiovascular:  Negative for chest pain, palpitations and leg swelling.  Gastrointestinal:  Negative for abdominal distention, abdominal pain, anal bleeding, blood in stool, constipation, diarrhea, nausea and vomiting.  Musculoskeletal:  Negative for arthralgias and myalgias.  Skin:  Negative for color change and pallor.  Neurological:  Negative for dizziness, syncope and weakness.  Psychiatric/Behavioral:  Negative for confusion. The patient is not nervous/anxious.   All other systems reviewed and are negative.    Medications No current facility-administered medications on file prior to encounter.   Current Outpatient Medications on File Prior to Encounter  Medication Sig Dispense Refill   Cholecalciferol 25 MCG (1000 UT) TBDP Take 2 tablets by mouth daily.     CINNAMON PO Take by mouth.     Coenzyme Q10 100 MG TABS Take 100 mg by mouth daily.     cyanocobalamin 1000 MCG tablet Take 1 tablet by mouth daily.     dicyclomine (BENTYL) 20 MG tablet TAKE 1 TABLET (20 MG TOTAL) BY MOUTH 3 TIMES DAILY BEFORE MEALS. AS NEEDED FOR ABDOMINAL CRAMPING 270 tablet 3   dutasteride (AVODART) 0.5 MG capsule Take 1 capsule (0.5 mg total) by mouth daily. 90 capsule 1   gabapentin (NEURONTIN) 300 MG capsule Take 3 capsules by mouth 3 (three) times daily.  Glucosamine-Chondroitin 250-200 MG TABS Take by mouth.     hydrOXYzine (ATARAX) 25 MG tablet Take 25 mg by mouth 2 (two) times daily as needed.     ibuprofen (ADVIL) 200 MG tablet Take 600 mg by mouth 2 (two) times daily as needed.     metFORMIN (GLUCOPHAGE) 1000 MG tablet TAKE 1 TABLET (1,000 MG TOTAL) BY MOUTH TWICE A DAY WITH FOOD 180 tablet 3   Multiple Vitamin (MULTIVITAMIN) tablet Take 1 tablet by mouth daily.     Omega-3 Fatty Acids  (FISH OIL) 1000 MG CAPS Take by mouth.     pantoprazole (PROTONIX) 40 MG tablet Take 1 tablet (40 mg total) by mouth 2 (two) times daily before a meal. 180 tablet 3   tamsulosin (FLOMAX) 0.4 MG CAPS capsule TAKE 2 CAPSULES BY MOUTH EVERY DAY 180 capsule 1   acetaminophen (TYLENOL) 500 MG tablet Take 500 mg by mouth 2 (two) times daily as needed.     ALPRAZolam (XANAX) 0.5 MG tablet Take 0.5 mg by mouth 2 (two) times daily as needed.     amphetamine-dextroamphetamine (ADDERALL) 10 MG tablet      aspirin EC 81 MG tablet Take 81 mg by mouth daily.     atorvastatin (LIPITOR) 10 MG tablet Take 1 tablet (10 mg total) by mouth daily. 90 tablet 1   b complex vitamins capsule Take by mouth.     Blood Glucose Monitoring Suppl (TRUE METRIX AIR GLUCOSE METER) w/Device KIT Check blood sugar 2 times daily 1 kit 0   buPROPion (WELLBUTRIN XL) 150 MG 24 hr tablet Take 150 mg by mouth daily.     desoximetasone (TOPICORT) 0.25 % cream Apply 1 Application topically 2 (two) times daily as needed. 60 g 3   diclofenac sodium (VOLTAREN) 1 % GEL Apply topically.     DULoxetine (CYMBALTA) 60 MG capsule Take 60 mg by mouth 2 (two) times daily.     lidocaine (LIDODERM) 5 % SMARTSIG:Topical     SHINGRIX injection Inject 0.5 mL into muscle for shingles vaccine. 2nd dose 0.5 mL 0   sildenafil (VIAGRA) 100 MG tablet Take 1 tablet (100 mg total) by mouth daily as needed for erectile dysfunction. 100 tablet 3   testosterone cypionate (DEPOTESTOSTERONE CYPIONATE) 200 MG/ML injection INJECT 1 ML (200 MG TOTAL) INTO THE MUSCLE EVERY 14 DAYS 2 mL 3   tiZANidine (ZANAFLEX) 2 MG tablet Take 2 mg by mouth at bedtime as needed.     tiZANidine (ZANAFLEX) 4 MG tablet 1 tablet twice daily, May take an additional 1/2 tablet at bedtime as needed     traMADol (ULTRAM) 50 MG tablet Take 50 mg by mouth every 6 (six) hours as needed.     traZODone (DESYREL) 50 MG tablet Take 25 mg by mouth at bedtime as needed.     TRUE METRIX BLOOD GLUCOSE  TEST test strip Check blood sugar 2 times a day 200 each 3    Pertinent medications related to GI and procedure were reviewed by me with the patient prior to the procedure   Current Facility-Administered Medications:    0.9 %  sodium chloride infusion, , Intravenous, Continuous, [place], Boykin Nearing, MD, Last Rate: 20 mL/hr at 01/22/24 1005, New Bag at 01/22/24 1005  sodium chloride 20 mL/hr at 01/22/24 1005       Allergies  Allergen Reactions   Benzoin Other (See Comments)    blisters   Doxycycline Hyclate Nausea Only   Morphine Other (See Comments)   Penicillins  Other (See Comments)   Allergies were reviewed by me prior to the procedure  Objective   Body mass index is 25.77 kg/m. Vitals:   01/22/24 0958  BP: 128/85  Pulse: 75  Resp: 20  Temp: (!) 96 F (35.6 C)  TempSrc: Temporal  SpO2: 98%  Weight: 86.2 kg  Height: 6' (1.829 m)     Physical Exam Vitals and nursing note reviewed.  Constitutional:      General: He is not in acute distress.    Appearance: Normal appearance. He is not ill-appearing, toxic-appearing or diaphoretic.  HENT:     Head: Normocephalic and atraumatic.     Nose: Nose normal.     Mouth/Throat:     Mouth: Mucous membranes are moist.     Pharynx: Oropharynx is clear.  Eyes:     General: No scleral icterus.    Extraocular Movements: Extraocular movements intact.  Cardiovascular:     Rate and Rhythm: Normal rate and regular rhythm.     Heart sounds: Normal heart sounds. No murmur heard.    No friction rub. No gallop.  Pulmonary:     Effort: Pulmonary effort is normal. No respiratory distress.     Breath sounds: Normal breath sounds. No wheezing, rhonchi or rales.  Abdominal:     General: Bowel sounds are normal. There is no distension.     Palpations: Abdomen is soft.     Tenderness: There is no abdominal tenderness. There is no guarding or rebound.  Musculoskeletal:     Cervical back: Neck supple.     Right lower leg: No edema.      Left lower leg: No edema.  Skin:    General: Skin is warm and dry.     Coloration: Skin is not jaundiced or pale.  Neurological:     General: No focal deficit present.     Mental Status: He is alert and oriented to person, place, and time. Mental status is at baseline.  Psychiatric:        Mood and Affect: Mood normal.        Behavior: Behavior normal.        Thought Content: Thought content normal.        Judgment: Judgment normal.      Assessment:  Mr. Zian Delair. is a 63 y.o. male  who presents today for Colonoscopy for colorectal cancer screening .  Plan:  Colonoscopy with possible intervention today  Colonoscopy with possible biopsy, control of bleeding, polypectomy, and interventions as necessary has been discussed with the patient/patient representative. Informed consent was obtained from the patient/patient representative after explaining the indication, nature, and risks of the procedure including but not limited to death, bleeding, perforation, missed neoplasm/lesions, cardiorespiratory compromise, and reaction to medications. Opportunity for questions was given and appropriate answers were provided. Patient/patient representative has verbalized understanding is amenable to undergoing the procedure.   Jaynie Collins, DO  [facility] Gastroenterology  Portions of the record may have been created with voice recognition software. Occasional wrong-word or 'sound-a-like' substitutions may have occurred due to the inherent limitations of voice recognition software.  Read the chart carefully and recognize, using context, where substitutions may have occurred.

## 2024-01-23 LAB — SURGICAL PATHOLOGY

## 2024-01-26 ENCOUNTER — Other Ambulatory Visit: Payer: Self-pay | Admitting: Family Medicine

## 2024-01-26 DIAGNOSIS — I10 Essential (primary) hypertension: Secondary | ICD-10-CM

## 2024-01-27 ENCOUNTER — Encounter: Admitting: Physician Assistant

## 2024-01-27 DIAGNOSIS — I10 Essential (primary) hypertension: Secondary | ICD-10-CM | POA: Diagnosis not present

## 2024-01-27 DIAGNOSIS — L97512 Non-pressure chronic ulcer of other part of right foot with fat layer exposed: Secondary | ICD-10-CM | POA: Diagnosis not present

## 2024-01-27 DIAGNOSIS — E1143 Type 2 diabetes mellitus with diabetic autonomic (poly)neuropathy: Secondary | ICD-10-CM | POA: Diagnosis not present

## 2024-01-27 DIAGNOSIS — E11621 Type 2 diabetes mellitus with foot ulcer: Secondary | ICD-10-CM | POA: Diagnosis not present

## 2024-01-27 DIAGNOSIS — L97513 Non-pressure chronic ulcer of other part of right foot with necrosis of muscle: Secondary | ICD-10-CM | POA: Diagnosis not present

## 2024-01-27 NOTE — Telephone Encounter (Signed)
 Requested by interface surecripts.  Requested Prescriptions  Pending Prescriptions Disp Refills   propranolol (INDERAL) 40 MG tablet [Pharmacy Med Name: PROPRANOLOL 40 MG TABLET] 270 tablet 0    Sig: TAKE 1 TABLET BY MOUTH 3 TIMES DAILY.     Cardiovascular:  Beta Blockers Passed - 01/27/2024 10:17 AM      Passed - Last BP in normal range    BP Readings from Last 1 Encounters:  01/22/24 113/77         Passed - Last Heart Rate in normal range    Pulse Readings from Last 1 Encounters:  01/22/24 64         Passed - Valid encounter within last 6 months    Recent Outpatient Visits           5 months ago Annual physical exam   [facility] [place], Netta Neat, DO   6 months ago Diabetic ulcer of toe of right foot associated with type 2 diabetes mellitus, with fat layer exposed [facility])   [place] [facility] [place], Salvadore Oxford, NP   7 months ago Essential (primary) hypertension   [facility] [place], Gentry Fitz A, RPH-CPP   7 months ago Essential (primary) hypertension   [place] [place], Gentry Fitz A, RPH-CPP   10 months ago Hyperlipidemia associated with type 2 diabetes mellitus [facility])   [place] [facility] [place], Jackelyn Poling, RPH-CPP       Future Appointments             In 7 months Stoioff, Verna Czech, MD [facility] [place]

## 2024-02-03 ENCOUNTER — Encounter: Attending: Internal Medicine | Admitting: Internal Medicine

## 2024-02-03 DIAGNOSIS — E1143 Type 2 diabetes mellitus with diabetic autonomic (poly)neuropathy: Secondary | ICD-10-CM | POA: Diagnosis not present

## 2024-02-03 DIAGNOSIS — L97512 Non-pressure chronic ulcer of other part of right foot with fat layer exposed: Secondary | ICD-10-CM | POA: Diagnosis not present

## 2024-02-03 DIAGNOSIS — L97513 Non-pressure chronic ulcer of other part of right foot with necrosis of muscle: Secondary | ICD-10-CM | POA: Insufficient documentation

## 2024-02-03 DIAGNOSIS — E11621 Type 2 diabetes mellitus with foot ulcer: Secondary | ICD-10-CM | POA: Insufficient documentation

## 2024-02-03 LAB — GLUCOSE, CAPILLARY: Glucose-Capillary: 125 mg/dL — ABNORMAL HIGH (ref 70–99)

## 2024-02-10 ENCOUNTER — Encounter: Admitting: Physician Assistant

## 2024-02-10 ENCOUNTER — Other Ambulatory Visit: Payer: Self-pay | Admitting: Urology

## 2024-02-10 DIAGNOSIS — L97512 Non-pressure chronic ulcer of other part of right foot with fat layer exposed: Secondary | ICD-10-CM | POA: Diagnosis not present

## 2024-02-10 DIAGNOSIS — E11621 Type 2 diabetes mellitus with foot ulcer: Secondary | ICD-10-CM | POA: Diagnosis not present

## 2024-02-10 DIAGNOSIS — E1143 Type 2 diabetes mellitus with diabetic autonomic (poly)neuropathy: Secondary | ICD-10-CM | POA: Diagnosis not present

## 2024-02-10 DIAGNOSIS — L97513 Non-pressure chronic ulcer of other part of right foot with necrosis of muscle: Secondary | ICD-10-CM | POA: Diagnosis not present

## 2024-02-13 ENCOUNTER — Other Ambulatory Visit

## 2024-02-13 ENCOUNTER — Telehealth: Payer: Self-pay | Admitting: Pharmacist

## 2024-02-13 NOTE — Telephone Encounter (Signed)
   Outreach Note  02/13/2024 Name: Dale [name]. MRN: [id number] DOB: Feb 02, 1961  Referred by: Smitty Cords, DO  Was unable to reach patient via telephone today and unable to leave a message as no voicemail picks up  Follow Up Plan: If no call back received from patient, will attempt to reach patient by telephone again in October to aid with patient assistance re-enrollment  Estelle Grumbles, PharmD, Patsy [place], CPP Clinical Pharmacist [facility] [phone]

## 2024-02-19 ENCOUNTER — Encounter: Admitting: Physician Assistant

## 2024-02-19 DIAGNOSIS — L97513 Non-pressure chronic ulcer of other part of right foot with necrosis of muscle: Secondary | ICD-10-CM | POA: Diagnosis not present

## 2024-02-19 DIAGNOSIS — E11621 Type 2 diabetes mellitus with foot ulcer: Secondary | ICD-10-CM | POA: Diagnosis not present

## 2024-02-19 DIAGNOSIS — E1143 Type 2 diabetes mellitus with diabetic autonomic (poly)neuropathy: Secondary | ICD-10-CM | POA: Diagnosis not present

## 2024-02-23 DIAGNOSIS — M19071 Primary osteoarthritis, right ankle and foot: Secondary | ICD-10-CM | POA: Diagnosis not present

## 2024-02-23 DIAGNOSIS — E11621 Type 2 diabetes mellitus with foot ulcer: Secondary | ICD-10-CM | POA: Diagnosis not present

## 2024-02-23 DIAGNOSIS — E08621 Diabetes mellitus due to underlying condition with foot ulcer: Secondary | ICD-10-CM | POA: Diagnosis not present

## 2024-02-23 DIAGNOSIS — M205X1 Other deformities of toe(s) (acquired), right foot: Secondary | ICD-10-CM | POA: Diagnosis not present

## 2024-02-23 DIAGNOSIS — L97512 Non-pressure chronic ulcer of other part of right foot with fat layer exposed: Secondary | ICD-10-CM | POA: Diagnosis not present

## 2024-02-23 DIAGNOSIS — M2021 Hallux rigidus, right foot: Secondary | ICD-10-CM | POA: Diagnosis not present

## 2024-02-27 ENCOUNTER — Ambulatory Visit: Payer: Self-pay | Admitting: Family Medicine

## 2024-02-27 ENCOUNTER — Encounter: Admitting: Physician Assistant

## 2024-02-27 DIAGNOSIS — E11621 Type 2 diabetes mellitus with foot ulcer: Secondary | ICD-10-CM | POA: Diagnosis not present

## 2024-02-27 DIAGNOSIS — E1143 Type 2 diabetes mellitus with diabetic autonomic (poly)neuropathy: Secondary | ICD-10-CM | POA: Diagnosis not present

## 2024-02-27 DIAGNOSIS — L97513 Non-pressure chronic ulcer of other part of right foot with necrosis of muscle: Secondary | ICD-10-CM | POA: Diagnosis not present

## 2024-02-28 ENCOUNTER — Other Ambulatory Visit: Payer: Self-pay | Admitting: Urology

## 2024-03-01 DIAGNOSIS — G894 Chronic pain syndrome: Secondary | ICD-10-CM | POA: Diagnosis not present

## 2024-03-01 DIAGNOSIS — Z0289 Encounter for other administrative examinations: Secondary | ICD-10-CM | POA: Diagnosis not present

## 2024-03-03 ENCOUNTER — Other Ambulatory Visit: Payer: Self-pay

## 2024-03-04 DIAGNOSIS — Z09 Encounter for follow-up examination after completed treatment for conditions other than malignant neoplasm: Secondary | ICD-10-CM | POA: Diagnosis not present

## 2024-03-04 DIAGNOSIS — S81801A Unspecified open wound, right lower leg, initial encounter: Secondary | ICD-10-CM | POA: Diagnosis not present

## 2024-03-05 ENCOUNTER — Encounter: Attending: Physician Assistant | Admitting: Physician Assistant

## 2024-03-05 DIAGNOSIS — M86671 Other chronic osteomyelitis, right ankle and foot: Secondary | ICD-10-CM | POA: Insufficient documentation

## 2024-03-05 DIAGNOSIS — E11621 Type 2 diabetes mellitus with foot ulcer: Secondary | ICD-10-CM | POA: Diagnosis not present

## 2024-03-05 DIAGNOSIS — I10 Essential (primary) hypertension: Secondary | ICD-10-CM | POA: Diagnosis not present

## 2024-03-05 DIAGNOSIS — E1143 Type 2 diabetes mellitus with diabetic autonomic (poly)neuropathy: Secondary | ICD-10-CM | POA: Diagnosis not present

## 2024-03-05 DIAGNOSIS — L97513 Non-pressure chronic ulcer of other part of right foot with necrosis of muscle: Secondary | ICD-10-CM | POA: Diagnosis not present

## 2024-03-05 DIAGNOSIS — L97512 Non-pressure chronic ulcer of other part of right foot with fat layer exposed: Secondary | ICD-10-CM | POA: Diagnosis not present

## 2024-03-12 ENCOUNTER — Encounter: Admitting: Physician Assistant

## 2024-03-12 DIAGNOSIS — I10 Essential (primary) hypertension: Secondary | ICD-10-CM | POA: Diagnosis not present

## 2024-03-12 DIAGNOSIS — L97513 Non-pressure chronic ulcer of other part of right foot with necrosis of muscle: Secondary | ICD-10-CM | POA: Diagnosis not present

## 2024-03-12 DIAGNOSIS — L97512 Non-pressure chronic ulcer of other part of right foot with fat layer exposed: Secondary | ICD-10-CM | POA: Diagnosis not present

## 2024-03-12 DIAGNOSIS — M86671 Other chronic osteomyelitis, right ankle and foot: Secondary | ICD-10-CM | POA: Diagnosis not present

## 2024-03-12 DIAGNOSIS — E11621 Type 2 diabetes mellitus with foot ulcer: Secondary | ICD-10-CM | POA: Diagnosis not present

## 2024-03-12 DIAGNOSIS — E1143 Type 2 diabetes mellitus with diabetic autonomic (poly)neuropathy: Secondary | ICD-10-CM | POA: Diagnosis not present

## 2024-03-18 ENCOUNTER — Other Ambulatory Visit: Payer: Self-pay | Admitting: Family Medicine

## 2024-03-18 ENCOUNTER — Ambulatory Visit: Admitting: Physician Assistant

## 2024-03-18 DIAGNOSIS — E1169 Type 2 diabetes mellitus with other specified complication: Secondary | ICD-10-CM

## 2024-03-19 NOTE — Telephone Encounter (Signed)
 OV 08/29/23 Requested Prescriptions  Pending Prescriptions Disp Refills   atorvastatin  (LIPITOR) 10 MG tablet [Pharmacy Med Name: ATORVASTATIN  10 MG TABLET] 90 tablet 1    Sig: TAKE 1 TABLET BY MOUTH EVERY DAY     Cardiovascular:  Antilipid - Statins Failed - 03/19/2024  3:55 PM      Failed - Valid encounter within last 12 months    Recent Outpatient Visits   None     Future Appointments             In 5 months Stoioff, Kizzie Perks, MD [facility] [place]            Failed - Lipid Panel in normal range within the last 12 months    Cholesterol, Total  Date Value Ref Range Status  06/26/2015 104 100 - 199 mg/dL Final   Cholesterol  Date Value Ref Range Status  08/22/2023 108 <200 mg/dL Final   LDL Cholesterol (Calc)  Date Value Ref Range Status  08/22/2023 46 mg/dL (calc) Final    Comment:    Reference range: <100 . Desirable range <100 mg/dL for primary prevention;   <70 mg/dL for patients with CHD or diabetic patients  with > or = 2 CHD risk factors. Aaron Aas LDL-C is now calculated using the Martin-Hopkins  calculation, which is a validated novel method providing  better accuracy than the Friedewald equation in the  estimation of LDL-C.  Melinda Sprawls et al. Erroll Heard. 4098;119(14): 2061-2068  (http://education.[employer].com/faq/FAQ164)    HDL  Date Value Ref Range Status  08/22/2023 41 > OR = 40 mg/dL Final  78/29/5621 30 (L) >39 mg/dL Final    Comment:    According to ATP-III Guidelines, HDL-C >59 mg/dL is considered a negative risk factor for CHD.    Triglycerides  Date Value Ref Range Status  08/22/2023 125 <150 mg/dL Final         Passed - Patient is not pregnant

## 2024-03-25 ENCOUNTER — Encounter: Admitting: Physician Assistant

## 2024-03-25 DIAGNOSIS — L97513 Non-pressure chronic ulcer of other part of right foot with necrosis of muscle: Secondary | ICD-10-CM | POA: Diagnosis not present

## 2024-03-25 DIAGNOSIS — I10 Essential (primary) hypertension: Secondary | ICD-10-CM | POA: Diagnosis not present

## 2024-03-25 DIAGNOSIS — M86671 Other chronic osteomyelitis, right ankle and foot: Secondary | ICD-10-CM | POA: Diagnosis not present

## 2024-03-25 DIAGNOSIS — E11621 Type 2 diabetes mellitus with foot ulcer: Secondary | ICD-10-CM | POA: Diagnosis not present

## 2024-03-25 DIAGNOSIS — E1143 Type 2 diabetes mellitus with diabetic autonomic (poly)neuropathy: Secondary | ICD-10-CM | POA: Diagnosis not present

## 2024-04-05 ENCOUNTER — Encounter: Admitting: Physician Assistant

## 2024-04-05 ENCOUNTER — Encounter: Attending: Physician Assistant | Admitting: Physician Assistant

## 2024-04-05 DIAGNOSIS — L97513 Non-pressure chronic ulcer of other part of right foot with necrosis of muscle: Secondary | ICD-10-CM | POA: Insufficient documentation

## 2024-04-05 DIAGNOSIS — I2510 Atherosclerotic heart disease of native coronary artery without angina pectoris: Secondary | ICD-10-CM | POA: Diagnosis not present

## 2024-04-05 DIAGNOSIS — Z01818 Encounter for other preprocedural examination: Secondary | ICD-10-CM | POA: Diagnosis not present

## 2024-04-05 DIAGNOSIS — L97512 Non-pressure chronic ulcer of other part of right foot with fat layer exposed: Secondary | ICD-10-CM | POA: Diagnosis not present

## 2024-04-05 DIAGNOSIS — M86671 Other chronic osteomyelitis, right ankle and foot: Secondary | ICD-10-CM | POA: Diagnosis not present

## 2024-04-05 DIAGNOSIS — E11621 Type 2 diabetes mellitus with foot ulcer: Secondary | ICD-10-CM | POA: Insufficient documentation

## 2024-04-05 DIAGNOSIS — I340 Nonrheumatic mitral (valve) insufficiency: Secondary | ICD-10-CM | POA: Diagnosis not present

## 2024-04-05 DIAGNOSIS — I10 Essential (primary) hypertension: Secondary | ICD-10-CM | POA: Diagnosis not present

## 2024-04-05 DIAGNOSIS — E1143 Type 2 diabetes mellitus with diabetic autonomic (poly)neuropathy: Secondary | ICD-10-CM | POA: Insufficient documentation

## 2024-04-05 DIAGNOSIS — E782 Mixed hyperlipidemia: Secondary | ICD-10-CM | POA: Diagnosis not present

## 2024-04-06 ENCOUNTER — Encounter: Admitting: Physician Assistant

## 2024-04-07 ENCOUNTER — Encounter: Admitting: Physician Assistant

## 2024-04-08 ENCOUNTER — Encounter: Admitting: Physician Assistant

## 2024-04-09 ENCOUNTER — Other Ambulatory Visit: Payer: Self-pay | Admitting: Urology

## 2024-04-09 ENCOUNTER — Encounter: Admitting: Physician Assistant

## 2024-04-09 DIAGNOSIS — N401 Enlarged prostate with lower urinary tract symptoms: Secondary | ICD-10-CM

## 2024-04-12 ENCOUNTER — Encounter: Admitting: Physician Assistant

## 2024-04-12 DIAGNOSIS — E1143 Type 2 diabetes mellitus with diabetic autonomic (poly)neuropathy: Secondary | ICD-10-CM | POA: Diagnosis not present

## 2024-04-12 DIAGNOSIS — E11621 Type 2 diabetes mellitus with foot ulcer: Secondary | ICD-10-CM | POA: Diagnosis not present

## 2024-04-12 DIAGNOSIS — M86671 Other chronic osteomyelitis, right ankle and foot: Secondary | ICD-10-CM | POA: Diagnosis not present

## 2024-04-12 DIAGNOSIS — L97512 Non-pressure chronic ulcer of other part of right foot with fat layer exposed: Secondary | ICD-10-CM | POA: Diagnosis not present

## 2024-04-12 DIAGNOSIS — L97513 Non-pressure chronic ulcer of other part of right foot with necrosis of muscle: Secondary | ICD-10-CM | POA: Diagnosis not present

## 2024-04-12 DIAGNOSIS — I10 Essential (primary) hypertension: Secondary | ICD-10-CM | POA: Diagnosis not present

## 2024-04-12 LAB — GLUCOSE, CAPILLARY: Glucose-Capillary: 123 mg/dL — ABNORMAL HIGH (ref 70–99)

## 2024-04-13 ENCOUNTER — Encounter: Admitting: Physician Assistant

## 2024-04-13 DIAGNOSIS — M86671 Other chronic osteomyelitis, right ankle and foot: Secondary | ICD-10-CM | POA: Diagnosis not present

## 2024-04-13 DIAGNOSIS — E1143 Type 2 diabetes mellitus with diabetic autonomic (poly)neuropathy: Secondary | ICD-10-CM | POA: Diagnosis not present

## 2024-04-13 DIAGNOSIS — L97513 Non-pressure chronic ulcer of other part of right foot with necrosis of muscle: Secondary | ICD-10-CM | POA: Diagnosis not present

## 2024-04-13 DIAGNOSIS — E11621 Type 2 diabetes mellitus with foot ulcer: Secondary | ICD-10-CM | POA: Diagnosis not present

## 2024-04-13 DIAGNOSIS — I10 Essential (primary) hypertension: Secondary | ICD-10-CM | POA: Diagnosis not present

## 2024-04-13 LAB — GLUCOSE, CAPILLARY
Glucose-Capillary: 133 mg/dL — ABNORMAL HIGH (ref 70–99)
Glucose-Capillary: 125 mg/dL — ABNORMAL HIGH (ref 70–99)

## 2024-04-14 ENCOUNTER — Encounter: Admitting: Physician Assistant

## 2024-04-15 ENCOUNTER — Encounter

## 2024-04-15 ENCOUNTER — Encounter: Admitting: Physician Assistant

## 2024-04-15 DIAGNOSIS — I10 Essential (primary) hypertension: Secondary | ICD-10-CM | POA: Diagnosis not present

## 2024-04-15 DIAGNOSIS — E11621 Type 2 diabetes mellitus with foot ulcer: Secondary | ICD-10-CM | POA: Diagnosis not present

## 2024-04-15 DIAGNOSIS — E1143 Type 2 diabetes mellitus with diabetic autonomic (poly)neuropathy: Secondary | ICD-10-CM | POA: Diagnosis not present

## 2024-04-15 DIAGNOSIS — L97513 Non-pressure chronic ulcer of other part of right foot with necrosis of muscle: Secondary | ICD-10-CM | POA: Diagnosis not present

## 2024-04-15 DIAGNOSIS — M86671 Other chronic osteomyelitis, right ankle and foot: Secondary | ICD-10-CM | POA: Diagnosis not present

## 2024-04-15 LAB — GLUCOSE, CAPILLARY
Glucose-Capillary: 107 mg/dL — ABNORMAL HIGH (ref 70–99)
Glucose-Capillary: 90 mg/dL (ref 70–99)

## 2024-04-16 ENCOUNTER — Encounter: Admitting: Physician Assistant

## 2024-04-19 ENCOUNTER — Encounter: Admitting: Physician Assistant

## 2024-04-20 ENCOUNTER — Encounter: Admitting: Physician Assistant

## 2024-04-21 ENCOUNTER — Encounter: Admitting: Internal Medicine

## 2024-04-21 DIAGNOSIS — E1143 Type 2 diabetes mellitus with diabetic autonomic (poly)neuropathy: Secondary | ICD-10-CM | POA: Diagnosis not present

## 2024-04-21 DIAGNOSIS — I10 Essential (primary) hypertension: Secondary | ICD-10-CM | POA: Diagnosis not present

## 2024-04-21 DIAGNOSIS — M86671 Other chronic osteomyelitis, right ankle and foot: Secondary | ICD-10-CM | POA: Diagnosis not present

## 2024-04-21 DIAGNOSIS — E11621 Type 2 diabetes mellitus with foot ulcer: Secondary | ICD-10-CM | POA: Diagnosis not present

## 2024-04-21 DIAGNOSIS — L97513 Non-pressure chronic ulcer of other part of right foot with necrosis of muscle: Secondary | ICD-10-CM | POA: Diagnosis not present

## 2024-04-21 LAB — GLUCOSE, CAPILLARY
Glucose-Capillary: 127 mg/dL — ABNORMAL HIGH (ref 70–99)
Glucose-Capillary: 144 mg/dL — ABNORMAL HIGH (ref 70–99)
Glucose-Capillary: 109 mg/dL — ABNORMAL HIGH (ref 70–99)

## 2024-04-22 ENCOUNTER — Encounter: Admitting: Internal Medicine

## 2024-04-23 ENCOUNTER — Encounter: Admitting: Internal Medicine

## 2024-04-23 DIAGNOSIS — L97512 Non-pressure chronic ulcer of other part of right foot with fat layer exposed: Secondary | ICD-10-CM | POA: Diagnosis not present

## 2024-04-23 DIAGNOSIS — E1143 Type 2 diabetes mellitus with diabetic autonomic (poly)neuropathy: Secondary | ICD-10-CM | POA: Diagnosis not present

## 2024-04-23 DIAGNOSIS — E11621 Type 2 diabetes mellitus with foot ulcer: Secondary | ICD-10-CM | POA: Diagnosis not present

## 2024-04-23 DIAGNOSIS — M86671 Other chronic osteomyelitis, right ankle and foot: Secondary | ICD-10-CM | POA: Diagnosis not present

## 2024-04-23 DIAGNOSIS — L97513 Non-pressure chronic ulcer of other part of right foot with necrosis of muscle: Secondary | ICD-10-CM | POA: Diagnosis not present

## 2024-04-23 DIAGNOSIS — I10 Essential (primary) hypertension: Secondary | ICD-10-CM | POA: Diagnosis not present

## 2024-04-23 LAB — GLUCOSE, CAPILLARY
Glucose-Capillary: 120 mg/dL — ABNORMAL HIGH (ref 70–99)
Glucose-Capillary: 167 mg/dL — ABNORMAL HIGH (ref 70–99)
Glucose-Capillary: 117 mg/dL — ABNORMAL HIGH (ref 70–99)

## 2024-04-26 ENCOUNTER — Encounter: Admitting: Physician Assistant

## 2024-04-26 ENCOUNTER — Other Ambulatory Visit: Payer: Self-pay | Admitting: Family Medicine

## 2024-04-26 DIAGNOSIS — L97513 Non-pressure chronic ulcer of other part of right foot with necrosis of muscle: Secondary | ICD-10-CM | POA: Diagnosis not present

## 2024-04-26 DIAGNOSIS — I10 Essential (primary) hypertension: Secondary | ICD-10-CM

## 2024-04-26 DIAGNOSIS — E11621 Type 2 diabetes mellitus with foot ulcer: Secondary | ICD-10-CM | POA: Diagnosis not present

## 2024-04-26 DIAGNOSIS — E1143 Type 2 diabetes mellitus with diabetic autonomic (poly)neuropathy: Secondary | ICD-10-CM | POA: Diagnosis not present

## 2024-04-26 DIAGNOSIS — M86671 Other chronic osteomyelitis, right ankle and foot: Secondary | ICD-10-CM | POA: Diagnosis not present

## 2024-04-26 LAB — GLUCOSE, CAPILLARY
Glucose-Capillary: 156 mg/dL — ABNORMAL HIGH (ref 70–99)
Glucose-Capillary: 99 mg/dL (ref 70–99)

## 2024-04-27 ENCOUNTER — Encounter: Admitting: Physician Assistant

## 2024-04-27 DIAGNOSIS — I10 Essential (primary) hypertension: Secondary | ICD-10-CM | POA: Diagnosis not present

## 2024-04-27 DIAGNOSIS — E11621 Type 2 diabetes mellitus with foot ulcer: Secondary | ICD-10-CM | POA: Diagnosis not present

## 2024-04-27 DIAGNOSIS — L97513 Non-pressure chronic ulcer of other part of right foot with necrosis of muscle: Secondary | ICD-10-CM | POA: Diagnosis not present

## 2024-04-27 DIAGNOSIS — M86671 Other chronic osteomyelitis, right ankle and foot: Secondary | ICD-10-CM | POA: Diagnosis not present

## 2024-04-27 DIAGNOSIS — E1143 Type 2 diabetes mellitus with diabetic autonomic (poly)neuropathy: Secondary | ICD-10-CM | POA: Diagnosis not present

## 2024-04-27 LAB — GLUCOSE, CAPILLARY
Glucose-Capillary: 151 mg/dL — ABNORMAL HIGH (ref 70–99)
Glucose-Capillary: 127 mg/dL — ABNORMAL HIGH (ref 70–99)

## 2024-04-27 NOTE — Telephone Encounter (Signed)
 Requested medication (s) are due for refill today:   Yes  Requested medication (s) are on the active medication list:   Yes  Future visit scheduled:   No.   Was a No Show for 02/13/2024 and 02/27/2024    Last ordered: 01/27/2024 #270, 0 refills  Unable to refill because missed his last 2 appts.     Requested Prescriptions  Pending Prescriptions Disp Refills   propranolol  (INDERAL ) 40 MG tablet [Pharmacy Med Name: PROPRANOLOL  40 MG TABLET] 270 tablet 0    Sig: TAKE 1 TABLET BY MOUTH 3 TIMES DAILY.     Cardiovascular:  Beta Blockers Failed - 04/27/2024  3:06 PM      Failed - Valid encounter within last 6 months    Recent Outpatient Visits   None     Future Appointments             In 4 months Stoioff, Glendia BROCKS, MD [facility] Urology [place]            Passed - Last BP in normal range    BP Readings from Last 1 Encounters:  01/22/24 113/77         Passed - Last Heart Rate in normal range    Pulse Readings from Last 1 Encounters:  01/22/24 64

## 2024-04-28 ENCOUNTER — Encounter

## 2024-04-28 ENCOUNTER — Encounter: Admitting: Physician Assistant

## 2024-04-28 DIAGNOSIS — E11621 Type 2 diabetes mellitus with foot ulcer: Secondary | ICD-10-CM | POA: Diagnosis not present

## 2024-04-28 DIAGNOSIS — I10 Essential (primary) hypertension: Secondary | ICD-10-CM | POA: Diagnosis not present

## 2024-04-28 DIAGNOSIS — M86671 Other chronic osteomyelitis, right ankle and foot: Secondary | ICD-10-CM | POA: Diagnosis not present

## 2024-04-28 DIAGNOSIS — E1143 Type 2 diabetes mellitus with diabetic autonomic (poly)neuropathy: Secondary | ICD-10-CM | POA: Diagnosis not present

## 2024-04-28 DIAGNOSIS — L97513 Non-pressure chronic ulcer of other part of right foot with necrosis of muscle: Secondary | ICD-10-CM | POA: Diagnosis not present

## 2024-04-28 LAB — GLUCOSE, CAPILLARY
Glucose-Capillary: 125 mg/dL — ABNORMAL HIGH (ref 70–99)
Glucose-Capillary: 108 mg/dL — ABNORMAL HIGH (ref 70–99)
Glucose-Capillary: 124 mg/dL — ABNORMAL HIGH (ref 70–99)

## 2024-04-29 ENCOUNTER — Encounter: Admitting: Physician Assistant

## 2024-04-29 DIAGNOSIS — E11621 Type 2 diabetes mellitus with foot ulcer: Secondary | ICD-10-CM | POA: Diagnosis not present

## 2024-04-29 DIAGNOSIS — M86671 Other chronic osteomyelitis, right ankle and foot: Secondary | ICD-10-CM | POA: Diagnosis not present

## 2024-04-29 DIAGNOSIS — E1143 Type 2 diabetes mellitus with diabetic autonomic (poly)neuropathy: Secondary | ICD-10-CM | POA: Diagnosis not present

## 2024-04-29 DIAGNOSIS — L97513 Non-pressure chronic ulcer of other part of right foot with necrosis of muscle: Secondary | ICD-10-CM | POA: Diagnosis not present

## 2024-04-29 DIAGNOSIS — I10 Essential (primary) hypertension: Secondary | ICD-10-CM | POA: Diagnosis not present

## 2024-04-29 LAB — GLUCOSE, CAPILLARY
Glucose-Capillary: 171 mg/dL — ABNORMAL HIGH (ref 70–99)
Glucose-Capillary: 106 mg/dL — ABNORMAL HIGH (ref 70–99)

## 2024-04-30 ENCOUNTER — Encounter: Admitting: Physician Assistant

## 2024-04-30 DIAGNOSIS — E1143 Type 2 diabetes mellitus with diabetic autonomic (poly)neuropathy: Secondary | ICD-10-CM | POA: Diagnosis not present

## 2024-04-30 DIAGNOSIS — M86671 Other chronic osteomyelitis, right ankle and foot: Secondary | ICD-10-CM | POA: Diagnosis not present

## 2024-04-30 DIAGNOSIS — L97513 Non-pressure chronic ulcer of other part of right foot with necrosis of muscle: Secondary | ICD-10-CM | POA: Diagnosis not present

## 2024-04-30 DIAGNOSIS — L97512 Non-pressure chronic ulcer of other part of right foot with fat layer exposed: Secondary | ICD-10-CM | POA: Diagnosis not present

## 2024-04-30 DIAGNOSIS — I10 Essential (primary) hypertension: Secondary | ICD-10-CM | POA: Diagnosis not present

## 2024-04-30 DIAGNOSIS — E11621 Type 2 diabetes mellitus with foot ulcer: Secondary | ICD-10-CM | POA: Diagnosis not present

## 2024-04-30 LAB — GLUCOSE, CAPILLARY: Glucose-Capillary: 126 mg/dL — ABNORMAL HIGH (ref 70–99)

## 2024-05-03 ENCOUNTER — Encounter: Admitting: Physician Assistant

## 2024-05-03 ENCOUNTER — Other Ambulatory Visit: Payer: Self-pay | Admitting: Family Medicine

## 2024-05-03 DIAGNOSIS — E1143 Type 2 diabetes mellitus with diabetic autonomic (poly)neuropathy: Secondary | ICD-10-CM | POA: Diagnosis not present

## 2024-05-03 DIAGNOSIS — K219 Gastro-esophageal reflux disease without esophagitis: Secondary | ICD-10-CM

## 2024-05-03 DIAGNOSIS — I10 Essential (primary) hypertension: Secondary | ICD-10-CM | POA: Diagnosis not present

## 2024-05-03 DIAGNOSIS — L97513 Non-pressure chronic ulcer of other part of right foot with necrosis of muscle: Secondary | ICD-10-CM | POA: Diagnosis not present

## 2024-05-03 DIAGNOSIS — E11621 Type 2 diabetes mellitus with foot ulcer: Secondary | ICD-10-CM | POA: Diagnosis not present

## 2024-05-03 DIAGNOSIS — M86671 Other chronic osteomyelitis, right ankle and foot: Secondary | ICD-10-CM | POA: Diagnosis not present

## 2024-05-03 LAB — GLUCOSE, CAPILLARY
Glucose-Capillary: 98 mg/dL (ref 70–99)
Glucose-Capillary: 128 mg/dL — ABNORMAL HIGH (ref 70–99)
Glucose-Capillary: 104 mg/dL — ABNORMAL HIGH (ref 70–99)

## 2024-05-04 ENCOUNTER — Encounter: Attending: Physician Assistant | Admitting: Physician Assistant

## 2024-05-04 ENCOUNTER — Encounter: Admitting: Physician Assistant

## 2024-05-04 DIAGNOSIS — L97513 Non-pressure chronic ulcer of other part of right foot with necrosis of muscle: Secondary | ICD-10-CM | POA: Diagnosis not present

## 2024-05-04 DIAGNOSIS — M86671 Other chronic osteomyelitis, right ankle and foot: Secondary | ICD-10-CM | POA: Insufficient documentation

## 2024-05-04 DIAGNOSIS — E11621 Type 2 diabetes mellitus with foot ulcer: Secondary | ICD-10-CM | POA: Diagnosis not present

## 2024-05-04 DIAGNOSIS — E1143 Type 2 diabetes mellitus with diabetic autonomic (poly)neuropathy: Secondary | ICD-10-CM | POA: Insufficient documentation

## 2024-05-04 DIAGNOSIS — I10 Essential (primary) hypertension: Secondary | ICD-10-CM | POA: Insufficient documentation

## 2024-05-04 LAB — GLUCOSE, CAPILLARY
Glucose-Capillary: 126 mg/dL — ABNORMAL HIGH (ref 70–99)
Glucose-Capillary: 123 mg/dL — ABNORMAL HIGH (ref 70–99)

## 2024-05-05 ENCOUNTER — Encounter: Admitting: Physician Assistant

## 2024-05-05 DIAGNOSIS — I10 Essential (primary) hypertension: Secondary | ICD-10-CM | POA: Diagnosis not present

## 2024-05-05 DIAGNOSIS — E11621 Type 2 diabetes mellitus with foot ulcer: Secondary | ICD-10-CM | POA: Diagnosis not present

## 2024-05-05 DIAGNOSIS — L97513 Non-pressure chronic ulcer of other part of right foot with necrosis of muscle: Secondary | ICD-10-CM | POA: Diagnosis not present

## 2024-05-05 DIAGNOSIS — M86671 Other chronic osteomyelitis, right ankle and foot: Secondary | ICD-10-CM | POA: Diagnosis not present

## 2024-05-05 DIAGNOSIS — E1143 Type 2 diabetes mellitus with diabetic autonomic (poly)neuropathy: Secondary | ICD-10-CM | POA: Diagnosis not present

## 2024-05-05 LAB — GLUCOSE, CAPILLARY
Glucose-Capillary: 154 mg/dL — ABNORMAL HIGH (ref 70–99)
Glucose-Capillary: 118 mg/dL — ABNORMAL HIGH (ref 70–99)

## 2024-05-05 NOTE — Telephone Encounter (Signed)
 OFFICE VISIT NEEDED FOR ADDITIONAL REFILLS   Requested Prescriptions  Pending Prescriptions Disp Refills   pantoprazole  (PROTONIX ) 40 MG tablet [Pharmacy Med Name: PANTOPRAZOLE  SOD DR 40 MG TAB] 30 tablet 0    Sig: TAKE 1 TABLET (40 MG TOTAL) BY MOUTH TWICE A DAY BEFORE MEALS     Gastroenterology: Proton Pump Inhibitors Failed - 05/05/2024  8:36 AM      Failed - Valid encounter within last 12 months    Recent Outpatient Visits   None     Future Appointments             In 4 months Stoioff, Glendia BROCKS, MD [facility]

## 2024-05-06 ENCOUNTER — Encounter: Admitting: Physician Assistant

## 2024-05-06 DIAGNOSIS — M86671 Other chronic osteomyelitis, right ankle and foot: Secondary | ICD-10-CM | POA: Diagnosis not present

## 2024-05-06 DIAGNOSIS — L97512 Non-pressure chronic ulcer of other part of right foot with fat layer exposed: Secondary | ICD-10-CM | POA: Diagnosis not present

## 2024-05-06 DIAGNOSIS — E1143 Type 2 diabetes mellitus with diabetic autonomic (poly)neuropathy: Secondary | ICD-10-CM | POA: Diagnosis not present

## 2024-05-06 DIAGNOSIS — I10 Essential (primary) hypertension: Secondary | ICD-10-CM | POA: Diagnosis not present

## 2024-05-06 DIAGNOSIS — L97513 Non-pressure chronic ulcer of other part of right foot with necrosis of muscle: Secondary | ICD-10-CM | POA: Diagnosis not present

## 2024-05-06 DIAGNOSIS — E11621 Type 2 diabetes mellitus with foot ulcer: Secondary | ICD-10-CM | POA: Diagnosis not present

## 2024-05-06 LAB — GLUCOSE, CAPILLARY
Glucose-Capillary: 108 mg/dL — ABNORMAL HIGH (ref 70–99)
Glucose-Capillary: 106 mg/dL — ABNORMAL HIGH (ref 70–99)

## 2024-05-10 ENCOUNTER — Encounter: Admitting: Physician Assistant

## 2024-05-10 ENCOUNTER — Other Ambulatory Visit: Payer: Self-pay | Admitting: Family Medicine

## 2024-05-10 DIAGNOSIS — E11621 Type 2 diabetes mellitus with foot ulcer: Secondary | ICD-10-CM | POA: Diagnosis not present

## 2024-05-10 DIAGNOSIS — E1143 Type 2 diabetes mellitus with diabetic autonomic (poly)neuropathy: Secondary | ICD-10-CM | POA: Diagnosis not present

## 2024-05-10 DIAGNOSIS — I10 Essential (primary) hypertension: Secondary | ICD-10-CM | POA: Diagnosis not present

## 2024-05-10 DIAGNOSIS — L97513 Non-pressure chronic ulcer of other part of right foot with necrosis of muscle: Secondary | ICD-10-CM | POA: Diagnosis not present

## 2024-05-10 DIAGNOSIS — F5101 Primary insomnia: Secondary | ICD-10-CM

## 2024-05-10 DIAGNOSIS — M86671 Other chronic osteomyelitis, right ankle and foot: Secondary | ICD-10-CM | POA: Diagnosis not present

## 2024-05-10 LAB — GLUCOSE, CAPILLARY
Glucose-Capillary: 91 mg/dL (ref 70–99)
Glucose-Capillary: 127 mg/dL — ABNORMAL HIGH (ref 70–99)
Glucose-Capillary: 116 mg/dL — ABNORMAL HIGH (ref 70–99)

## 2024-05-10 NOTE — Telephone Encounter (Signed)
 Copied from CRM [id number]. Topic: Clinical - Medication Refill >> May 10, 2024  5:06 PM Wess RAMAN wrote: Medication: traZODone  (DESYREL ) 50 MG tablet   Has the patient contacted their pharmacy? Yes (Agent: If no, request that the patient contact the pharmacy for the refill. If patient does not wish to contact the pharmacy document the reason why and proceed with request.) (Agent: If yes, when and what did the pharmacy advise?)  This is the patient's preferred pharmacy:  [facility] [phone] [place], [place] - [address] [place] [address] Phone: [phone] Fax: [phone]   Is this the correct pharmacy for this prescription? Yes If no, delete pharmacy and type the correct one.   Has the prescription been filled recently? Yes  Is the patient out of the medication? No  Has the patient been seen for an appointment in the last year OR does the patient have an upcoming appointment? Yes  Can we respond through MyChart? Yes  Agent: Please be advised that Rx refills may take up to 3 business days. We ask that you follow-up with your pharmacy.

## 2024-05-11 ENCOUNTER — Encounter: Admitting: Physician Assistant

## 2024-05-11 DIAGNOSIS — I10 Essential (primary) hypertension: Secondary | ICD-10-CM | POA: Diagnosis not present

## 2024-05-11 DIAGNOSIS — L97513 Non-pressure chronic ulcer of other part of right foot with necrosis of muscle: Secondary | ICD-10-CM | POA: Diagnosis not present

## 2024-05-11 DIAGNOSIS — E1143 Type 2 diabetes mellitus with diabetic autonomic (poly)neuropathy: Secondary | ICD-10-CM | POA: Diagnosis not present

## 2024-05-11 DIAGNOSIS — M86671 Other chronic osteomyelitis, right ankle and foot: Secondary | ICD-10-CM | POA: Diagnosis not present

## 2024-05-11 DIAGNOSIS — E11621 Type 2 diabetes mellitus with foot ulcer: Secondary | ICD-10-CM | POA: Diagnosis not present

## 2024-05-11 LAB — GLUCOSE, CAPILLARY
Glucose-Capillary: 108 mg/dL — ABNORMAL HIGH (ref 70–99)
Glucose-Capillary: 114 mg/dL — ABNORMAL HIGH (ref 70–99)

## 2024-05-12 ENCOUNTER — Telehealth: Payer: Self-pay

## 2024-05-12 ENCOUNTER — Other Ambulatory Visit: Payer: Self-pay

## 2024-05-12 ENCOUNTER — Encounter: Admitting: Physician Assistant

## 2024-05-12 DIAGNOSIS — M86671 Other chronic osteomyelitis, right ankle and foot: Secondary | ICD-10-CM | POA: Diagnosis not present

## 2024-05-12 DIAGNOSIS — I10 Essential (primary) hypertension: Secondary | ICD-10-CM | POA: Diagnosis not present

## 2024-05-12 DIAGNOSIS — E1143 Type 2 diabetes mellitus with diabetic autonomic (poly)neuropathy: Secondary | ICD-10-CM | POA: Diagnosis not present

## 2024-05-12 DIAGNOSIS — E11621 Type 2 diabetes mellitus with foot ulcer: Secondary | ICD-10-CM | POA: Diagnosis not present

## 2024-05-12 DIAGNOSIS — L97513 Non-pressure chronic ulcer of other part of right foot with necrosis of muscle: Secondary | ICD-10-CM | POA: Diagnosis not present

## 2024-05-12 LAB — GLUCOSE, CAPILLARY
Glucose-Capillary: 118 mg/dL — ABNORMAL HIGH (ref 70–99)
Glucose-Capillary: 109 mg/dL — ABNORMAL HIGH (ref 70–99)

## 2024-05-12 MED ORDER — BLOOD GLUCOSE MONITORING SUPPL DEVI: 1.0000 | Freq: Three times a day (TID) | 0 refills | Status: AC

## 2024-05-12 MED ORDER — LANCET DEVICE MISC: 1.0000 | Freq: Three times a day (TID) | 0 refills | Status: AC

## 2024-05-12 MED ORDER — TRAZODONE HCL 50 MG PO TABS: 25.0000 mg | ORAL_TABLET | Freq: Every evening | ORAL | 3 refills | Status: AC | PRN

## 2024-05-12 MED ORDER — BLOOD GLUCOSE TEST VI STRP: 1.0000 | ORAL_STRIP | Freq: Three times a day (TID) | 0 refills | Status: DC

## 2024-05-12 MED ORDER — LANCETS MISC. MISC: 1.0000 | Freq: Three times a day (TID) | 0 refills | Status: AC

## 2024-05-12 NOTE — Telephone Encounter (Signed)
 Copied from CRM [id number]. Topic: Clinical - Prescription Issue >> May 11, 2024  4:29 PM Selinda RAMAN wrote: Reason for CRM: The patient called in stating his insurance will no longer pay for the [employer] supplies. He states they will cover at 100% [employer] supplies. He is asking for a new prescription for the meter, lancets and test strips be sent into his pharmacy  [phone] [place], [place] - [address]  Phone: [phone] Fax: [phone]   Please assist patient further

## 2024-05-12 NOTE — Telephone Encounter (Signed)
 Requested medication (s) are due for refill today: review  Requested medication (s) are on the active medication list: yes  Last refill:  08/26/22  Future visit scheduled: no  Notes to clinic:  Historical provider/medication, please review for refill       Requested Prescriptions  Pending Prescriptions Disp Refills   traZODone  (DESYREL ) 50 MG tablet      Sig: Take 0.5 tablets (25 mg total) by mouth at bedtime as needed.     Psychiatry: Antidepressants - Serotonin Modulator Failed - 05/12/2024  4:18 PM      Failed - Valid encounter within last 6 months    Recent Outpatient Visits   None     Future Appointments             In 3 months Stoioff, Glendia BROCKS, MD [facility] [place]            Passed - Completed PHQ-2 or PHQ-9 in the last 360 days

## 2024-05-13 ENCOUNTER — Encounter: Admitting: Physician Assistant

## 2024-05-13 ENCOUNTER — Telehealth: Payer: Self-pay

## 2024-05-13 DIAGNOSIS — L97512 Non-pressure chronic ulcer of other part of right foot with fat layer exposed: Secondary | ICD-10-CM | POA: Diagnosis not present

## 2024-05-13 DIAGNOSIS — M86671 Other chronic osteomyelitis, right ankle and foot: Secondary | ICD-10-CM | POA: Diagnosis not present

## 2024-05-13 DIAGNOSIS — I10 Essential (primary) hypertension: Secondary | ICD-10-CM | POA: Diagnosis not present

## 2024-05-13 DIAGNOSIS — E11621 Type 2 diabetes mellitus with foot ulcer: Secondary | ICD-10-CM | POA: Diagnosis not present

## 2024-05-13 DIAGNOSIS — L97513 Non-pressure chronic ulcer of other part of right foot with necrosis of muscle: Secondary | ICD-10-CM | POA: Diagnosis not present

## 2024-05-13 DIAGNOSIS — E1143 Type 2 diabetes mellitus with diabetic autonomic (poly)neuropathy: Secondary | ICD-10-CM | POA: Diagnosis not present

## 2024-05-13 LAB — GLUCOSE, CAPILLARY: Glucose-Capillary: 134 mg/dL — ABNORMAL HIGH (ref 70–99)

## 2024-05-13 NOTE — Telephone Encounter (Signed)
 LM for patient to return call. Ozempic  shipment received he can pick up at his convenience .

## 2024-05-14 ENCOUNTER — Encounter: Admitting: Physician Assistant

## 2024-05-14 LAB — GLUCOSE, CAPILLARY: Glucose-Capillary: 91 mg/dL (ref 70–99)

## 2024-05-17 ENCOUNTER — Encounter: Admitting: Physician Assistant

## 2024-05-17 ENCOUNTER — Other Ambulatory Visit: Admitting: Pharmacist

## 2024-05-17 DIAGNOSIS — Z7985 Long-term (current) use of injectable non-insulin antidiabetic drugs: Secondary | ICD-10-CM

## 2024-05-17 DIAGNOSIS — E1140 Type 2 diabetes mellitus with diabetic neuropathy, unspecified: Secondary | ICD-10-CM

## 2024-05-17 NOTE — Progress Notes (Signed)
 05/17/2024 Name: Dale [name]. MRN: [id number] DOB: 03-Apr-1961  Chief Complaint  Patient presents with   Medication Assistance   Medication Management    Dale [name]. is a 63 y.o. year old male who was referred to the pharmacist by their PCP for assistance in managing diabetes, hypertension, hyperlipidemia, and medication access.    Receive a message from the office requesting call back to patient regarding his Ozempic   Return call to patient.  Subjective:  Care Team: Primary Care Provider: Edman Marsa PARAS, DO Neurologist: Maree Jannett Hering, MD Psychiatrist: Raelyn Rhyme, MD Pain Management: [facility] Orthopedics: [facility] Urology: Twylla Glendia BROCKS, MD Gastroenterology: [facility] Cardiology: Hester Wolm Heinz, MD  Medication Access/Adherence  Current Pharmacy:  [phone], [place] - [address] [place] [address] Phone: [phone] Fax: [phone]  [facility] - [place], [place] - [address] [place] [address] Phone: [phone] Fax: [phone]   Patient reports affordability concerns with their medications: No  Patient reports access/transportation concerns to their pharmacy: No  Patient reports adherence concerns with their medications:  No     Patient advises that he was trying to get in touch regarding whether a refill of his Ozempic  had arrived to the office from the [employer] patient assistance program - From review of chart, note that CMA Aurora Fontana left patient a voicemail on 7/10 advising that this shipment had arrived to office  Diabetes:   Current medications:  - metformin  1000 mg twice daily - Ozempic  1 mg weekly   Current glucose readings: reports recent morning fasting readings ranging 90s-119   Denies symptoms of hypolycemia   Current physical activity: Limited by recent foot surgeries, but now  able to walk again   Statin: atorvastatin  10 mg daily   Current medication access support: Enrolled in patient assistance for Ozempic  through [employer] through 11/03/2024   Objective:  Lab Results  Component Value Date   HGBA1C 5.6 08/22/2023    Lab Results  Component Value Date   CREATININE 0.74 08/22/2023   BUN 18 08/22/2023   NA 140 08/22/2023   K 4.8 08/22/2023   CL 101 08/22/2023   CO2 32 08/22/2023    Lab Results  Component Value Date   CHOL 108 08/22/2023   HDL 41 08/22/2023   LDLCALC 46 08/22/2023   TRIG 125 08/22/2023   CHOLHDL 2.6 08/22/2023    Current Outpatient Medications on File Prior to Visit  Medication Sig Dispense Refill   acetaminophen  (TYLENOL ) 500 MG tablet Take 500 mg by mouth 2 (two) times daily as needed.     ALPRAZolam  (XANAX ) 0.5 MG tablet Take 0.5 mg by mouth 2 (two) times daily as needed.     amphetamine-dextroamphetamine (ADDERALL) 10 MG tablet      aspirin  EC 81 MG tablet Take 81 mg by mouth daily.     atorvastatin  (LIPITOR) 10 MG tablet TAKE 1 TABLET BY MOUTH EVERY DAY 90 tablet 1   b complex vitamins capsule Take by mouth.     Blood Glucose Monitoring Suppl (TRUE METRIX AIR GLUCOSE METER) w/Device KIT Check blood sugar 2 times daily 1 kit 0   Blood Glucose Monitoring Suppl DEVI 1 each by Does not apply route in the morning, at noon, and at bedtime. May substitute to any manufacturer covered by patient's insurance. 1 each 0   buPROPion  (WELLBUTRIN  XL) 150 MG 24 hr  tablet Take 150 mg by mouth daily.     Cholecalciferol 25 MCG (1000 UT) TBDP Take 2 tablets by mouth daily.     CINNAMON PO Take by mouth.     Coenzyme Q10 100 MG TABS Take 100 mg by mouth daily.     cyanocobalamin  1000 MCG tablet Take 1 tablet by mouth daily.     desoximetasone  (TOPICORT ) 0.25 % cream Apply 1 Application topically 2 (two) times daily as needed. 60 g 3   diclofenac sodium (VOLTAREN) 1 % GEL Apply topically.     dicyclomine  (BENTYL ) 20 MG tablet TAKE 1  TABLET (20 MG TOTAL) BY MOUTH 3 TIMES DAILY BEFORE MEALS. AS NEEDED FOR ABDOMINAL CRAMPING 270 tablet 3   DULoxetine  (CYMBALTA ) 60 MG capsule Take 60 mg by mouth 2 (two) times daily.     dutasteride  (AVODART ) 0.5 MG capsule TAKE 1 CAPSULE BY MOUTH EVERY DAY 90 capsule 1   gabapentin  (NEURONTIN ) 300 MG capsule Take 3 capsules by mouth 3 (three) times daily.     Glucosamine-Chondroitin 250-200 MG TABS Take by mouth.     Glucose Blood (BLOOD GLUCOSE TEST STRIPS) STRP 1 each by In Vitro route in the morning, at noon, and at bedtime. May substitute to any manufacturer covered by patient's insurance. 100 strip 0   hydrOXYzine  (ATARAX ) 25 MG tablet Take 25 mg by mouth 2 (two) times daily as needed.     ibuprofen (ADVIL) 200 MG tablet Take 600 mg by mouth 2 (two) times daily as needed.     Lancet Device MISC 1 each by Does not apply route in the morning, at noon, and at bedtime. May substitute to any manufacturer covered by patient's insurance. 1 each 0   Lancets Misc. MISC 1 each by Does not apply route in the morning, at noon, and at bedtime. May substitute to any manufacturer covered by patient's insurance. 100 each 0   lidocaine  (LIDODERM ) 5 % SMARTSIG:Topical     metFORMIN  (GLUCOPHAGE ) 1000 MG tablet TAKE 1 TABLET (1,000 MG TOTAL) BY MOUTH TWICE A DAY WITH FOOD 180 tablet 3   Multiple Vitamin (MULTIVITAMIN) tablet Take 1 tablet by mouth daily.     Omega-3 Fatty Acids (FISH OIL ) 1000 MG CAPS Take by mouth.     OZEMPIC , 1 MG/DOSE, 4 MG/3ML SOPN INJECT 1MG  INTO THE SKIN ONCE A WEEK 9 mL 1   pantoprazole  (PROTONIX ) 40 MG tablet TAKE 1 TABLET (40 MG TOTAL) BY MOUTH TWICE A DAY BEFORE MEALS 30 tablet 0   propranolol  (INDERAL ) 40 MG tablet TAKE 1 TABLET BY MOUTH 3 TIMES DAILY. 270 tablet 0   SHINGRIX  injection Inject 0.5 mL into muscle for shingles vaccine. 2nd dose 0.5 mL 0   sildenafil  (VIAGRA ) 100 MG tablet Take 1 tablet (100 mg total) by mouth daily as needed for erectile dysfunction. 100 tablet 3    tamsulosin  (FLOMAX ) 0.4 MG CAPS capsule TAKE 2 CAPSULES BY MOUTH EVERY DAY 180 capsule 1   testosterone  cypionate (DEPOTESTOSTERONE CYPIONATE) 200 MG/ML injection INJECT 1 ML (200 MG TOTAL) INTO THE MUSCLE EVERY 14 DAYS 6 mL 0   tiZANidine (ZANAFLEX) 2 MG tablet Take 2 mg by mouth at bedtime as needed.     tiZANidine (ZANAFLEX) 4 MG tablet 1 tablet twice daily, May take an additional 1/2 tablet at bedtime as needed     traMADol (ULTRAM) 50 MG tablet Take 50 mg by mouth every 6 (six) hours as needed.     traZODone  (DESYREL ) 50 MG tablet  Take 0.5 tablets (25 mg total) by mouth at bedtime as needed. 45 tablet 3   TRUE METRIX BLOOD GLUCOSE TEST test strip Check blood sugar 2 times a day 200 each 3   No current facility-administered medications on file prior to visit.        Assessment/Plan:   Patient plans to pick up patient assistance program supply of Ozempic  from office tomorrow, 7/15  Diabetes: - Currently controlled - Recommend to continue to monitor home blood sugar and to contact office if needed for readings outside of established parameters or new symptoms - Patient to follow up with [facility] as needed for refills of Ozempic  from [employer] patient assistance program     Follow Up Plan: Clinical Pharmacist to follow up with patient by telephone again on 08/20/2024 at 12:00 PM    Sharyle Sia, PharmD, JAQUELINE, CPP Clinical Pharmacist [facility] [phone]

## 2024-05-17 NOTE — Patient Instructions (Signed)
Goals Addressed             This Visit's Progress    Pharmacy Goals       Our goal A1c is less than 7%. This corresponds with fasting sugars less than 130 and 2 hour after meal sugars less than 180. Please check your blood sugar and keep a log of the results  Our goal bad cholesterol, or LDL, is less than 70 . This is why it is important to continue taking your atorvastatin  Check your blood pressure once daily, and any time you have concerning symptoms like headache, chest pain, dizziness, shortness of breath, or vision changes.   Our goal is less than 130/80.  To appropriately check your blood pressure, make sure you do the following:  1) Avoid caffeine, exercise, or tobacco products for 30 minutes before checking. Empty your bladder. 2) Sit with your back supported in a flat-backed chair. Rest your arm on something flat (arm of the chair, table, etc). 3) Sit still with your feet flat on the floor, resting, for at least 5 minutes.  4) Check your blood pressure. Take 1-2 readings.  5) Write down these readings and bring with you to any provider appointments.  Bring your home blood pressure machine with you to a provider's office for accuracy comparison at least once a year.   Make sure you take your blood pressure medications before you come to any office visit, even if you were asked to fast for labs.   Feel free to call me with any questions or concerns. I look forward to our next call!   Estelle Grumbles, PharmD, [place], CPP Clinical Pharmacist [facility] [phone]

## 2024-05-18 ENCOUNTER — Encounter: Admitting: Physician Assistant

## 2024-05-18 DIAGNOSIS — I10 Essential (primary) hypertension: Secondary | ICD-10-CM | POA: Diagnosis not present

## 2024-05-18 DIAGNOSIS — L97513 Non-pressure chronic ulcer of other part of right foot with necrosis of muscle: Secondary | ICD-10-CM | POA: Diagnosis not present

## 2024-05-18 DIAGNOSIS — E1143 Type 2 diabetes mellitus with diabetic autonomic (poly)neuropathy: Secondary | ICD-10-CM | POA: Diagnosis not present

## 2024-05-18 DIAGNOSIS — E11621 Type 2 diabetes mellitus with foot ulcer: Secondary | ICD-10-CM | POA: Diagnosis not present

## 2024-05-18 DIAGNOSIS — M86671 Other chronic osteomyelitis, right ankle and foot: Secondary | ICD-10-CM | POA: Diagnosis not present

## 2024-05-18 LAB — GLUCOSE, CAPILLARY
Glucose-Capillary: 117 mg/dL — ABNORMAL HIGH (ref 70–99)
Glucose-Capillary: 104 mg/dL — ABNORMAL HIGH (ref 70–99)

## 2024-05-19 ENCOUNTER — Encounter: Admitting: Physician Assistant

## 2024-05-19 ENCOUNTER — Encounter

## 2024-05-19 DIAGNOSIS — L0889 Other specified local infections of the skin and subcutaneous tissue: Secondary | ICD-10-CM | POA: Diagnosis not present

## 2024-05-19 DIAGNOSIS — L97513 Non-pressure chronic ulcer of other part of right foot with necrosis of muscle: Secondary | ICD-10-CM | POA: Diagnosis not present

## 2024-05-19 DIAGNOSIS — I10 Essential (primary) hypertension: Secondary | ICD-10-CM | POA: Diagnosis not present

## 2024-05-19 DIAGNOSIS — Z4789 Encounter for other orthopedic aftercare: Secondary | ICD-10-CM | POA: Diagnosis not present

## 2024-05-19 DIAGNOSIS — E11621 Type 2 diabetes mellitus with foot ulcer: Secondary | ICD-10-CM | POA: Diagnosis not present

## 2024-05-19 DIAGNOSIS — E785 Hyperlipidemia, unspecified: Secondary | ICD-10-CM | POA: Diagnosis not present

## 2024-05-19 DIAGNOSIS — E870 Hyperosmolality and hypernatremia: Secondary | ICD-10-CM | POA: Diagnosis not present

## 2024-05-19 DIAGNOSIS — L03116 Cellulitis of left lower limb: Secondary | ICD-10-CM | POA: Diagnosis not present

## 2024-05-19 DIAGNOSIS — Z7982 Long term (current) use of aspirin: Secondary | ICD-10-CM | POA: Diagnosis not present

## 2024-05-19 DIAGNOSIS — G8929 Other chronic pain: Secondary | ICD-10-CM | POA: Diagnosis not present

## 2024-05-19 DIAGNOSIS — M109 Gout, unspecified: Secondary | ICD-10-CM | POA: Diagnosis not present

## 2024-05-19 DIAGNOSIS — L97519 Non-pressure chronic ulcer of other part of right foot with unspecified severity: Secondary | ICD-10-CM | POA: Diagnosis not present

## 2024-05-19 DIAGNOSIS — D72829 Elevated white blood cell count, unspecified: Secondary | ICD-10-CM | POA: Diagnosis not present

## 2024-05-19 DIAGNOSIS — Z79899 Other long term (current) drug therapy: Secondary | ICD-10-CM | POA: Diagnosis not present

## 2024-05-19 DIAGNOSIS — B9562 Methicillin resistant Staphylococcus aureus infection as the cause of diseases classified elsewhere: Secondary | ICD-10-CM | POA: Diagnosis not present

## 2024-05-19 DIAGNOSIS — R509 Fever, unspecified: Secondary | ICD-10-CM | POA: Diagnosis not present

## 2024-05-19 DIAGNOSIS — D638 Anemia in other chronic diseases classified elsewhere: Secondary | ICD-10-CM | POA: Diagnosis not present

## 2024-05-19 DIAGNOSIS — Z794 Long term (current) use of insulin: Secondary | ICD-10-CM | POA: Diagnosis not present

## 2024-05-19 DIAGNOSIS — R7881 Bacteremia: Secondary | ICD-10-CM | POA: Diagnosis not present

## 2024-05-19 DIAGNOSIS — D509 Iron deficiency anemia, unspecified: Secondary | ICD-10-CM | POA: Diagnosis not present

## 2024-05-19 DIAGNOSIS — M19071 Primary osteoarthritis, right ankle and foot: Secondary | ICD-10-CM | POA: Diagnosis not present

## 2024-05-19 DIAGNOSIS — E876 Hypokalemia: Secondary | ICD-10-CM | POA: Diagnosis not present

## 2024-05-19 DIAGNOSIS — E119 Type 2 diabetes mellitus without complications: Secondary | ICD-10-CM | POA: Diagnosis not present

## 2024-05-19 DIAGNOSIS — L03115 Cellulitis of right lower limb: Secondary | ICD-10-CM | POA: Diagnosis not present

## 2024-05-19 DIAGNOSIS — K7460 Unspecified cirrhosis of liver: Secondary | ICD-10-CM | POA: Diagnosis not present

## 2024-05-19 DIAGNOSIS — I2510 Atherosclerotic heart disease of native coronary artery without angina pectoris: Secondary | ICD-10-CM | POA: Diagnosis not present

## 2024-05-19 DIAGNOSIS — L03032 Cellulitis of left toe: Secondary | ICD-10-CM | POA: Diagnosis not present

## 2024-05-19 DIAGNOSIS — E1142 Type 2 diabetes mellitus with diabetic polyneuropathy: Secondary | ICD-10-CM | POA: Diagnosis not present

## 2024-05-19 DIAGNOSIS — L03031 Cellulitis of right toe: Secondary | ICD-10-CM | POA: Diagnosis not present

## 2024-05-19 DIAGNOSIS — M86671 Other chronic osteomyelitis, right ankle and foot: Secondary | ICD-10-CM | POA: Diagnosis not present

## 2024-05-19 DIAGNOSIS — R918 Other nonspecific abnormal finding of lung field: Secondary | ICD-10-CM | POA: Diagnosis not present

## 2024-05-19 DIAGNOSIS — Z7984 Long term (current) use of oral hypoglycemic drugs: Secondary | ICD-10-CM | POA: Diagnosis not present

## 2024-05-19 DIAGNOSIS — Z880 Allergy status to penicillin: Secondary | ICD-10-CM | POA: Diagnosis not present

## 2024-05-19 DIAGNOSIS — E1143 Type 2 diabetes mellitus with diabetic autonomic (poly)neuropathy: Secondary | ICD-10-CM | POA: Diagnosis not present

## 2024-05-19 DIAGNOSIS — M546 Pain in thoracic spine: Secondary | ICD-10-CM | POA: Diagnosis not present

## 2024-05-19 LAB — GLUCOSE, CAPILLARY
Glucose-Capillary: 105 mg/dL — ABNORMAL HIGH (ref 70–99)
Glucose-Capillary: 132 mg/dL — ABNORMAL HIGH (ref 70–99)

## 2024-05-19 NOTE — ED Provider Notes (Signed)
 [facility] Emergency Department Provider Note    ED Clinical Impression     Diagnosis ICD-10-CM Associated Orders  1. Great toe pain, left  M79.675     2. Cellulitis of toe of left foot  L03.032           Impression, Medical Decision Making, Progress Notes and Critical Care    Impression, Differential Diagnosis and Plan of Care  Dale [name] is a 63 y.o. male with a PMH of HTN, CTS, narcotic dependence, T2DM, HLD, arthrodesis malunion, coronary atherosclerosis presenting to the ED for acute worsening, swelling, and reddening of a year long wound to his R great toe.  Vitals within normal limits. On exam, erythema, warmth, and swelling to the R great toe.  Differential includes, but is not limited to: Cellulitis, osteomyelitis, gout  Plan to obtain basic labs, CRP, Blood Culture, Sepsis workup, POCT Glucose, XR R Toe. Will treat patient with Tylenol , cefepime, and metroNIDAZOLE.  WBC of 15.3, CRP 162. IV antibiotics started, due to penicillin allergy cefepime and metronidazole while using conjunction with vancomycin.  Discussed laboratory and imaging results with the patient, understands and agrees with the plan for admission  Discussed the case with orthopedic surgery who recommends medical admission.  Patient's care will be transferred to incoming ED physician.   ED Course as of 05/20/24 0122  Wed May 19, 2024  2252 Hi, I'd like to admit Dale [name] MRN [id number] in the ED for concerns for osteomyelitis. Thanks, Julio [phone]  Paged       Additional MDM Elements       I have reviewed recent and relavant previous record, including: Outpatient notes - 05/19/2024 [facility] Telephone call for most recent convo with Ortho  Social Determinants that significantly affected care: NA Prescription drugs considered but not prescribed: NA Diagnostic tests considered but not performed: NA   Portions of this  record have been created using Scientist, clinical (histocompatibility and immunogenetics). Dictation errors have been sought, but may not have been identified and corrected.  See chart and nursing documentation for additional ED course details.  ____________________________________________      History     Reason for Visit Toe Swelling   HPI  Dale [name] is a 63 y.o. male presenting to he ED for toe swelling. The patient reports acute worsening, swelling, and reddening of a year long wound to his R great toe. He endorses fever and chills. He has been in contact with a wound care specialist, who recommended him to the ED today after receiving pictures of the wound. Denies vomiting or diarrhea.   Outside Historian(s) (EMS, Significant Other, Family, Parent, Caregiver, Friend, Patent examiner, etc.)  none  Past Medical History[1]  Past Surgical History[2]  No current facility-administered medications for this encounter.  Facility-Administered Medications Ordered in Other Encounters:  .  ropivacaine  (NAROPIN ) 5 mg/mL (0.5 %) injection, , Perineural, PRN (once a day), Odella Ozell Ade Christo, DO, 10 mL at 09/12/22 0952  Allergies Benzoin, Adhesive, Doxycycline hyclate, Morphine, and Penicillins  Family History[3]  Short Social History[4]    Physical Exam   ED Triage Vitals  Enc Vitals Group     BP 05/19/24 1704 128/80     Pulse 05/19/24 1704 93     SpO2 Pulse 05/19/24 1945 93     Resp 05/19/24 1704 18     Temp 05/19/24 1704 (!) 38.6 C (101.5 F)     Temp Source 05/19/24 1704 Oral  SpO2 05/19/24 1704 100 %     Weight --      Height --      Head Circumference --      Peak Flow --      Pain Score --      Pain Loc --      Pain Education --      Exclude from Growth Chart --     Constitutional: Alert and oriented. Well appearing and in no distress. Eyes: Conjunctivae are normal. ENT      Head: Normocephalic and atraumatic.      Nose: No congestion.      Mouth/Throat: Mucous  membranes are moist.      Neck: No stridor. Hematological/Lymphatic/Immunilogical: No cervical lymphadenopathy. Cardiovascular: Normal rate, regular rhythm. Normal and symmetric distal pulses are present in all extremities. Respiratory: Normal respiratory effort. Breath sounds are normal. Gastrointestinal: Soft and nontender. There is no CVA tenderness. Musculoskeletal: Normal range of motion in all extremities.      Right lower leg: Ulcer with surrounding erythema, warmth, and swelling to the R great toe.      Left lower leg: No tenderness or edema. Neurologic: Normal speech and language. No gross focal neurologic deficits are appreciated. Skin: Skin is warm, dry and intact. No rash noted. Psychiatric: Mood and affect are normal. Speech and behavior are normal.       Radiology   XR Chest Portable  Final Result    The left costophrenic angle is excluded and therefore unable to exclude a tiny left pleural effusion. No acute cardiopulmonary disease, otherwise.        XR Toes Right  Final Result  1.  First MTP arthrodesis. Hardware is intact without loosening.  2.  Masslike soft tissue swelling of the dorsomedial aspect of the great toe. This is nonspecific and can be seen with phlegmon, fluid collection (including abscess and hematoma), or mass/neoplasm.  3.  No osseous erosions to suggest acute osteomyelitis. If there is concern for osteomyelitis and/or hardware infection, an indium-tagged white blood cell study is more sensitive (MRI would be degraded by hardware artifact).  4.  Juxta-articular erosions of the first proximal phalangeal head can be seen with gout in the proper clinical setting.  5.  Second hammertoe correction with PIP resection arthroplasty and fusion.  6.  Moderate degenerative changes of the first interphalangeal joint.  7.  No acute fracture or acute traumatic malalignment.       Procedures including Critical Care   Documentation assistance was provided by  Reena Flatten, Scribe, on May 19, 2024 at 10:27 PM for Christell Guard, MD.  May 20, 2024 1:22 AM. Documentation assistance provided by the scribe. I was present during the time the encounter was recorded. The information recorded by the scribe was done at my direction and has been reviewed and validated by me.         [1] Past Medical History: Diagnosis Date  . Anxiety   . Chronic pain   . CTS (carpal tunnel syndrome)   . Depression   . Diabetes mellitus      . Hypertension   . Narcotic dependence      [2] Past Surgical History: Procedure Laterality Date  . ABDOMINAL SURGERY    . BACK SURGERY    . CARPAL TUNNEL RELEASE    . FOOT FRACTURE SURGERY    . PR FUSION BIG TOE,MT-P JT Right 07/19/2022   Procedure: ARTHRODESIS GREAT TOE; METATARSOPHALANGEAL JT;  Surgeon: Jacki Juliene Lynwood Nigel,  MD;  Location: [facility];  Service: Orthopedics  . PR FUSION BIG TOE,MT-P JT Right 09/11/2022   Procedure: ARTHRODESIS GREAT TOE; METATARSOPHALANGEAL JT;  Surgeon: Nigel Jacki Juliene Lynwood, MD;  [facility] OR [facility];  Service: Orthopedics  . PR FUSION BIG TOE,MT-P JT Right 12/06/2022   Procedure: ARTHRODESIS GREAT TOE; METATARSOPHALANGEAL JT;  Surgeon: Nigel Jacki Juliene Lynwood, MD;  [facility] OR [facility];  Service: Orthopedics  . PR INCISION & DRAINAGE COMPLEX PO WOUND INFECTION Right 09/11/2022   Procedure: INCISION & DRAINAGE, COMPLEX, POSTOPERATIVE WOUND INFECTION;  Surgeon: Nigel Jacki Juliene Lynwood, MD;  [facility];  Service: Orthopedics  . PR REMOVE INFUSN DEVICE/PUMP N/A 10/24/2017   Procedure: REMOV PREV IMPLNT SUBQ RESERVOIR/PUMP;  Surgeon: Corine Fairy Ashdown, MD;  [facility];  Service: Neurosurgery  . PR REMOVE SPINAL CANAL CATHETER N/A 10/24/2017   Procedure: REMOV PREV IMPLNT INTRATHECAL/EPIDURAL CATH;  Surgeon: Corine Fairy Ashdown, MD;  [facility];  Service: Neurosurgery  . PR REPAIR OF HAMMERTOE,ONE Right 07/19/2022    Procedure: CORRECTION HAMMERTOE;  Surgeon: Jacki Juliene Lynwood Nigel, MD;  Location: [facility];  Service: Orthopedics  . PR REPAIR OF HAMMERTOE,ONE Right 12/06/2022   Procedure: CORRECTION HAMMERTOE;  Surgeon: Nigel Jacki Juliene Lynwood, MD;  Location: [facility] OR [facility];  Service: Orthopedics  [3] Family History Problem Relation Age of Onset  . Diabetes Mother   . Heart disease Father   . Hypertension Father   . Diabetes Sister   . Diabetes Brother   [4] Social History Tobacco Use  . Smoking status: Former    Current packs/day: 0.00    Average packs/day: 1.5 packs/day for 33.0 years (49.5 ttl pk-yrs)    Types: Cigarettes, Pipe, Cigars    Start date: 08/02/1978    Quit date: 11/19/2010    Years since quitting: 13.5    Passive exposure: Past  . Smokeless tobacco: Never  . Tobacco comments:    smoked cigar and tobacco pipe for about 6 months.  Vaping Use  . Vaping status: Never Used  Substance Use Topics  . Alcohol use: No    Comment: Stopped 1993  . Drug use: No    Comment: best I can   Jearld Christell DEL, MD Resident 05/20/24 [phone]

## 2024-05-20 ENCOUNTER — Encounter: Admitting: Physician Assistant

## 2024-05-20 ENCOUNTER — Ambulatory Visit: Admitting: Physician Assistant

## 2024-05-20 DIAGNOSIS — M79675 Pain in left toe(s): Secondary | ICD-10-CM | POA: Diagnosis not present

## 2024-05-20 DIAGNOSIS — L97519 Non-pressure chronic ulcer of other part of right foot with unspecified severity: Secondary | ICD-10-CM | POA: Diagnosis not present

## 2024-05-20 DIAGNOSIS — S0990XA Unspecified injury of head, initial encounter: Secondary | ICD-10-CM | POA: Diagnosis not present

## 2024-05-20 DIAGNOSIS — L03031 Cellulitis of right toe: Secondary | ICD-10-CM | POA: Diagnosis not present

## 2024-05-20 DIAGNOSIS — R2241 Localized swelling, mass and lump, right lower limb: Secondary | ICD-10-CM | POA: Diagnosis not present

## 2024-05-20 NOTE — H&P (Signed)
 [facility] Medicine  History and Physical    Assessment and Plan <redacted file path>   Dale [name] is a 63 y.o. male who is presenting to [facility] with Cellulitis, in the setting of the following pertinent/contributing co-morbidities: T2DM .   Cellulitis  Illness that poses a threat to life or bodily function without appropriate treatment In toe with hardware in place; per ortho no intervention anticipated. Will continue to do broad spectrum IV antibiotics for now, formally consult ortho in AM.  - cont vanc, cefepime, flagyl - ortho consult in AM - If there is concern for osteo, would get an indium-tagged WBC study as MRI would be degraded by hardware artifact  Secondary/Additional Active Problems <redacted file path>: T2DM:  - hold metformin , ozempic  - SSI  HLD: - cont atorvastatin   Anxiety/depression: - cont home duloxetine , xanax   Chronic pain: - cont home tizanidine, tramadol, gabapentin     Prophylaxis -none, low risk, encourage ambulation  Diet -regular diet  Code Status / HCDM <redacted file path> -Full Code, = -  HCDM (patient stated preference): Picone,Cathy - Spouse - [phone]  Significant Comorbid Conditions <redacted file path>:   Issues Impacting Complexity of Management: <redacted file path>     I personally spent greater than 75 minutes face-to-face and non-face-to-face in the care of this patient, which includes all pre, intra, and post visit time on the date of service.  All documented time was specific to the E/M visit and does not include any procedures that may have been performed.  Estimated Date of Discharge: <redacted file path>   HPI    Dale [name] Dale [name] is a 63 y.o. male PMHx T2DM, HTN, HLD, chronic pain who is presenting to [facility] with Cellulitis. Patient states that he has been dealing with chronic wounds in his R foot (including his R great toe) for many months. He states that overall things had been going well  and he follows regularly with wound care. Over the past couple of days he has noticed increased swelling and redness to his R great toe. He also endorses chills. He called his orthopedic clinic and was recommended to come to the ED for IV antibiotics.  ER course <redacted file path> was notable for Tmax 38.6, otherwise hemodynamically stable. Labs notable for leukocytosis 15.3, hemoglobin 8.9, CRP 162, ESR 45.   Xray of R toes with  Impression  1.  First MTP arthrodesis. Hardware is intact without loosening.  2.  Masslike soft tissue swelling of the dorsomedial aspect of the great toe. This is nonspecific and can be seen with phlegmon, fluid collection (including abscess and hematoma), or mass/neoplasm.  3.  No osseous erosions to suggest acute osteomyelitis. If there is concern for osteomyelitis and/or hardware infection, an indium-tagged white blood cell study is more sensitive (MRI would be degraded by hardware artifact).  4.  Juxta-articular erosions of the first proximal phalangeal head can be seen with gout in the proper clinical setting.  5.  Second hammertoe correction with PIP resection arthroplasty and fusion.  6.  Moderate degenerative changes of the first interphalangeal joint.  7.  No acute fracture or acute traumatic malalignment.   Per ED provider ortho was contacted who noted no operative intervention anticipated and recommended medicine admission, no note placed by ortho yet at time of admission.   Med Rec Confidence <redacted file path>  I reviewed the Medication List. The current list is Accurate  Physical Exam  Temp:  [37.4 C (99.4 F)-38.6 C (101.5  F)] 37.4 C (99.4 F) Pulse:  [86-93] 86 SpO2 Pulse:  [87-94] 87 Resp:  [15-23] 23 BP: (100-132)/(59-84) 121/74 SpO2:  [96 %-100 %] 96 % There is no height or weight on file to calculate BMI. General: well-developed, laying comfortably in stretcher, no acute distress HEENT:  EOMI. Sclerae anicteric and without  injection. Oropharynx moist.  NECK: trachea midline and mobile, no submandibular or cervical lymphadenopathy RESP: CTAB, no increased work of breathing on room air CV: Regular rate and rhythm. Normal S1 and S2. No murmurs or gallops.  ABD: NABS, soft, non-distended, non-tender to palpation EXT: No lower extremity edema, cyanosis, or clubbing. Posterior tibial pulses are 2+ and symmetric.  MSK: No edema, normal range of motion SKIN: R great toe with significant swelling, erythema, and warmth. Ulcer on base of R great toe and R second toe. See media tab for pics. NEURO: No focal neurologic deficits, A/OX3

## 2024-05-21 ENCOUNTER — Encounter: Admitting: Physician Assistant

## 2024-05-21 DIAGNOSIS — L0889 Other specified local infections of the skin and subcutaneous tissue: Secondary | ICD-10-CM | POA: Diagnosis not present

## 2024-05-21 DIAGNOSIS — J984 Other disorders of lung: Secondary | ICD-10-CM | POA: Diagnosis not present

## 2024-05-21 DIAGNOSIS — L03031 Cellulitis of right toe: Secondary | ICD-10-CM | POA: Diagnosis not present

## 2024-05-21 DIAGNOSIS — L97519 Non-pressure chronic ulcer of other part of right foot with unspecified severity: Secondary | ICD-10-CM | POA: Diagnosis not present

## 2024-05-21 DIAGNOSIS — M546 Pain in thoracic spine: Secondary | ICD-10-CM | POA: Diagnosis not present

## 2024-05-21 DIAGNOSIS — B9562 Methicillin resistant Staphylococcus aureus infection as the cause of diseases classified elsewhere: Secondary | ICD-10-CM | POA: Diagnosis not present

## 2024-05-21 DIAGNOSIS — R7881 Bacteremia: Secondary | ICD-10-CM | POA: Diagnosis not present

## 2024-05-22 DIAGNOSIS — L03031 Cellulitis of right toe: Secondary | ICD-10-CM | POA: Diagnosis not present

## 2024-05-22 NOTE — Progress Notes (Signed)
 [facility] Hospitalist Daily Progress Note   LOS: 2 days   Assessment/Plan: Principal Problem:   Cellulitis Active Problems:   Chronic pain   Type II diabetes mellitus      Hyperlipidemia   Essential hypertension   Recurrent major depressive disorder   Unspecified anxiety disorder   Retained orthopedic hardware   Fall   MRSA bacteremia     Cellulitis  Hardware in place along 1st toe. Per ortho no intervention anticipated. CT foot (7/17) shows no suggestion of osteomyelitis. MRI of foot demonstrating abnormal osseous signal which may be degenerative vs osteomyelitis.  - Vancomycin stop date TBD  - Ortho following   MRSA Bacteremia  One of two blood cultures positive (7/17) for MRSA.  Presumably source of infection is right 1st toe. Given that this has occurred so soon after stopping PO clindamycin would suggest chronic infection of the toe and associated hardware.  MRI of the spine demonstrates no evidence of infection.  - IV Vancomycin as above  - Follow blood cultures every 48 hours until negative, 7/18 BC with no growth to date - ID following - Echo Monday    Possible cognitive impairment Demonstrating poor memory of events, and possible hallucination of events. Consider possible concussions from recurrent falls vs dementia. May need to obtain collateral information from family and verify information shared by patient.  - Follow clinical exam.    Secondary/Additional Active Problems <redacted file path>:   Hypomagnesia:  Magnesium 1.3 on 7/17, only up to 1.4 this morning.   - Mag Ox 800 BID  - Mag Sulfate 2g IV x 1 today  - Mg in am    Hypokalemia: Potassium 2.8 on 7/18. Has normalized to 3.5 this morning.  - BMP in am    Anemia:  New microcytic anemia noted on 7/17. Ferritin 27.9. Iron panel   - Defer IV iron until blood cultures have verified active infection has resolved  - Attempted to contact wife about history of routine colonoscopy to identify a potential source  of bleeding. No answer. Will attempt again.   T2DM:  A1c 5.8% on 09/11/22. Glycemic control acceptable since admission.  - Hold metformin , ozempic  - Continue SSI, ISF of 30, target glucose 140   Fall:  Patient had an unwitnessed mechanical fall on 7/17. Patient notes hitting his head on the ground with no LOC. Small laceration noted on left temple. No interventions needed for lacteration. Patient's neuro exam stable at this time. CT head shows no acute intracranial abnormalities.  - Fall preventions and bed alarm in place for patient safety   HLD: - Continue home atorvastatin  10 mg daily   Anxiety/depression: - Continue home duloxetine  60 mg BID and xanax  1 mg PRN   Chronic pain: - Continue home tizanidine, tramadol, gabapentin     DVT prophylaxis. Lovenox    Disposition: Floor.   Will likely DC home with home health with IV ABX next week.    Please page the [facility] pager at [phone] with questions.   Pending labs:  Pending Labs     Order Current Status   Reticulocytes In process   Blood Culture #1 Preliminary result   Blood Culture #1 Preliminary result   Blood Culture #2 Preliminary result   Blood Culture #2 Preliminary result       Subjective: Having a persistent headache since yesterday. No issues otherwise. States it was his understanding that he might be stopping antibiotics as soon as Monday.    Objective: Physical Exam:  GEN: NAD, supine in bed.    RESPIRATORY: Breathing pattern was normal and the chest moved symmetrically.  Lung sounds were normal and there were no adventitious sounds.   CARDIOVASCULAR: Heart rate and rhythm were normal.  S1 and S2 were normal and there were no extra sounds or murmurs.  ABDOMEN: The abdomen was normal in contour.  Bowel sounds were present.  Palpation detected no tenderness. GENITOURINARY - foley absent.  NEUROLOGIC: Mental status was normal.  The patient was normally coordinated in the arms and  legs.  PSYCHIATRIC: The patient was oriented to person, place, month and year but not day.  Speech was normal. Mood and affect were normal   Vital signs in last 24 hours: Temp:  [36.6 C (97.8 F)-36.8 C (98.2 F)] 36.8 C (98.2 F) Pulse:  [86-100] 87 Resp:  [18-20] 18 BP: (135-145)/(58-75) 136/58 MAP (mmHg):  [81-92] 81 SpO2:  [97 %-98 %] 98 %  Intake/Output last 24 hours:  Intake/Output Summary (Last 24 hours) at 05/22/2024 0810 Last data filed at 05/21/2024 1745 Gross per 24 hour  Intake 600 ml  Output 250 ml  Net 350 ml    Medications:  Scheduled Meds:Scheduled Medications[1] Continuous Infusions:Infusions Meds[2]  D Penne Khat, MD MD [facility].         [1] . atorvastatin   10 mg Oral Daily  . DULoxetine   60 mg Oral BID  . gabapentin   1,200 mg Oral TID  . insulin lispro  0-20 Units Subcutaneous ACHS  . magnesium oxide  800 mg Oral BID  . pantoprazole   40 mg Oral BID  . polyethylene glycol  17 g Oral Daily  . potassium chloride  40 mEq Oral TID  . tamsulosin   0.4 mg Oral Nightly  . tizanidine  4 mg Oral daily  . tizanidine  6 mg Oral Nightly  . vancomycin  1,500 mg Intravenous Q12H  [2]

## 2024-05-23 DIAGNOSIS — L03031 Cellulitis of right toe: Secondary | ICD-10-CM | POA: Diagnosis not present

## 2024-05-23 NOTE — Care Plan (Signed)
 Shift Summary Temperature remained stable with a slight increase noted.   Symptom management included emotional support hydroxyzine , traMADol, tylenol , Imitrex administration.   Insulin lispro was administered for blood glucose management.   Independence in daily living activities was maintained.   Overall, the patient maintained stability in vital signs and independence in activities.   Absence of Infection Signs and Symptoms: Temperature remained stable throughout the shift, with a slight increase from 36.4 C to 36.9 C. Contact precautions were maintained.   Optimal Coping: Pain in the head was constant and increased at times, with interventions including emotional support and administration of traMADol, tylenol , Imitrex .   Improved Ability to Complete Activities of Daily Living: Maintained independence in general hygiene and bathing activities throughout the shift.

## 2024-05-24 ENCOUNTER — Encounter: Admitting: Physician Assistant

## 2024-05-24 DIAGNOSIS — I071 Rheumatic tricuspid insufficiency: Secondary | ICD-10-CM | POA: Diagnosis not present

## 2024-05-24 DIAGNOSIS — L03031 Cellulitis of right toe: Secondary | ICD-10-CM | POA: Diagnosis not present

## 2024-05-24 DIAGNOSIS — I340 Nonrheumatic mitral (valve) insufficiency: Secondary | ICD-10-CM | POA: Diagnosis not present

## 2024-05-24 NOTE — Nursing Note (Signed)
   Care Management Reassessment   Estimated Discharge Date: 05/27/2024  Current Discharge Plan: Other: See notes below  Anticipated Changes: See notes below  Coordination of Care and Care Progression Notes: Post acute IVA need TBD, ID consulting, and outpatient therapy recommended will depend on whether pt needs Home Inf services.

## 2024-05-25 ENCOUNTER — Encounter: Admitting: Physician Assistant

## 2024-05-25 DIAGNOSIS — M546 Pain in thoracic spine: Secondary | ICD-10-CM | POA: Diagnosis not present

## 2024-05-25 DIAGNOSIS — I70201 Unspecified atherosclerosis of native arteries of extremities, right leg: Secondary | ICD-10-CM | POA: Diagnosis not present

## 2024-05-25 DIAGNOSIS — R7881 Bacteremia: Secondary | ICD-10-CM | POA: Diagnosis not present

## 2024-05-25 DIAGNOSIS — L0889 Other specified local infections of the skin and subcutaneous tissue: Secondary | ICD-10-CM | POA: Diagnosis not present

## 2024-05-25 DIAGNOSIS — B9562 Methicillin resistant Staphylococcus aureus infection as the cause of diseases classified elsewhere: Secondary | ICD-10-CM | POA: Diagnosis not present

## 2024-05-25 DIAGNOSIS — L03031 Cellulitis of right toe: Secondary | ICD-10-CM | POA: Diagnosis not present

## 2024-05-26 ENCOUNTER — Encounter: Admitting: Physician Assistant

## 2024-05-26 DIAGNOSIS — L03031 Cellulitis of right toe: Secondary | ICD-10-CM | POA: Diagnosis not present

## 2024-05-26 NOTE — BH Treatment Plan (Addendum)
 [facility] Infectious Diseases OPAT (Outpatient Parenteral Antimicrobial Therapy) Program Treatment Plan     This patient has been accepted into the [facility] OPAT program. Our program will be responsible for monitoring patient while on IV antimicrobial therapy and signing orders for safety monitoring labs, antimicrobials, and CVAD care.    Dale [name] Dale [name]  Referring Service/Physician: HM Dr. Merleen ID Attending: Norleen Platter ID Fellow: N/A  Primary ID Diagnosis/Diagnoses: MRSA bacteremia 2/2  R distal great toe infected ulcer with presumed osteomyelitis, likely involving hardware   Please refer to most recent ID consult note for relevant history, hospital course, ID medical decision making, and future considerations.   ID providers - Please place future outpatient imaging orders. Imaging will be scheduled by OPAT administrative assistant.     Primary Team/Case Manager: Please copy and paste below information into home infusion referral:   IV Antimicrobial #1 and Dose: daptomycin 700 mg Q 24 H IV Antimicrobial #2 and Dose: N/A PO Antimicrobials and Dose: N/A  Anaphylaxis kit to be dispensed as needed for first dosing at home  Start Date of therapy: 05/19/24 Anticipated stop date for therapy: 06/30/24  Suppressive Therapy doxycycline 100 mg BID (consider trial of doxy for suppression, pending surgical plan. He has had nausea with agent previously but willing to re-trial) Suppressive Therapy End Date: TBD  F/u Imaging (please include anticipated study):  #1- Right before end of treatment: N/A #2 -Imaging needed during treatment at another time: N/A  Requested weekly labs: First set of labs to be done at [facility] visit  - Mondays or Tuesdays: CBC with differential, basic metabolic panel, creatinine kinase (CK - for daptomycin), and ESR and CRP     Weekly labs should be obtained by home health nursing on Mondays or Tuesdays each week unless orders are  conveyed to home infusion by OPAT team for labs to be obtained during the week of discharge.    IV access: PICC: Flush with 5 mLs of Normal Saline before and after each use and prn for line maintenance. Use after blood draws. Flush with Heparin 100units/mL 2 mLs at the end of each use and prn for line maintenance. Please change needleless cap weekly and after blood draws, or prn using aseptic technique. Please flush unused lumen(s) with Heparin 100units/mL 2 mLs every day.   - Home health RN shall provide education to patient/caregiver on infusion protocol and line maintenance.  - Home health RN will change CVAD dressing/extension tubing, and obtain labs (may be pulled from central access device) at weekly intervals. CVAD dressing kit to include CHG Gel dressing or Biopatch. -  Microclave to be changed weekly with dressing changes, and after line is used for blood collection.     - Home health RN should provide weekly CVAD care until line is removed.  - Home infusion agency can provide 2 mg Cathflo (alteplase) x2 for CVAD occlusion as needed (per protocol). Verification of cost and nursing availability must be completed by infusion agency.  - Home Infusion agency to dispense Antiseptic Barrier Cap to attach after each use of IV line.   ID Provider Following : Norleen Platter  Fax labs and OPAT orders to attention of: [facility] OPAT Program             OPAT Program Phone: [phone]  OPAT Program Fax:  [phone]   ID Provider listed will not be responsible for signing orders for care that is outside of the scope of the OPAT program  including: PT/OT, wound care or wound vac orders.   Please ensure that an ancillary provider (primary care, surgery or wound clinic) is listed for these orders if indicated.    Follow-up ID Appointment will be arranged by OPAT Administrative Staff (do not send request to [facility]). If this appointment needs to be rescheduled, please have the patient call [phone]

## 2024-05-26 NOTE — Care Plan (Signed)
 Shift Summary Contact precautions were maintained throughout the shift.   Pain decreased from 8 to 3 after administration of ibuprofen and sumatriptan.   The wound dressing was changed and cleansed with pharmaceutical agents.   Glucose levels fluctuated but remained within a manageable range.   Overall, the patient remained stable with no falls or infections noted.   Absence of Infection Signs and Symptoms: Contact precautions were consistently maintained throughout the shift. The wound dressing was changed and cleansed with pharmaceutical agents, and the site assessments for both the peripheral IV and PICC line remained clean, dry, and intact.   Absence of Fall and Fall-Related Injury: Hourly visual checks confirmed the patient was awake and in bed, and toilet assistance was provided every two hours. Safety measures such as side rails were consistently used, and the patient remained independent in mobility.   Skin Health and Integrity: Skin assessments noted bruising and thin epidermis with loss of subcutaneous tissue, but pressure reduction techniques were encouraged. The dressing on the wound was changed, and the skin remained clean, dry, and intact.

## 2024-05-27 ENCOUNTER — Encounter: Admitting: Physician Assistant

## 2024-05-27 ENCOUNTER — Ambulatory Visit: Admitting: Physician Assistant

## 2024-05-27 DIAGNOSIS — M546 Pain in thoracic spine: Secondary | ICD-10-CM | POA: Diagnosis not present

## 2024-05-27 DIAGNOSIS — L03031 Cellulitis of right toe: Secondary | ICD-10-CM | POA: Diagnosis not present

## 2024-05-27 DIAGNOSIS — L03032 Cellulitis of left toe: Secondary | ICD-10-CM | POA: Diagnosis not present

## 2024-05-27 DIAGNOSIS — R7881 Bacteremia: Secondary | ICD-10-CM | POA: Diagnosis not present

## 2024-05-27 DIAGNOSIS — A4902 Methicillin resistant Staphylococcus aureus infection, unspecified site: Secondary | ICD-10-CM | POA: Diagnosis not present

## 2024-05-27 DIAGNOSIS — L0889 Other specified local infections of the skin and subcutaneous tissue: Secondary | ICD-10-CM | POA: Diagnosis not present

## 2024-05-27 DIAGNOSIS — W19XXXA Unspecified fall, initial encounter: Secondary | ICD-10-CM | POA: Diagnosis not present

## 2024-05-27 DIAGNOSIS — E870 Hyperosmolality and hypernatremia: Secondary | ICD-10-CM | POA: Diagnosis not present

## 2024-05-27 DIAGNOSIS — B9562 Methicillin resistant Staphylococcus aureus infection as the cause of diseases classified elsewhere: Secondary | ICD-10-CM | POA: Diagnosis not present

## 2024-05-27 DIAGNOSIS — D72829 Elevated white blood cell count, unspecified: Secondary | ICD-10-CM | POA: Diagnosis not present

## 2024-05-27 NOTE — Care Plan (Signed)
 Patient alert and oriented x4. Gave PRN nightly xanax . No acute events overnight.  Problem: Adult Inpatient Plan of Care Goal: Plan of Care Review Outcome: Ongoing - Unchanged Goal: Patient-Specific Goal (Individualized) Outcome: Ongoing - Unchanged Goal: Absence of Hospital-Acquired Illness or Injury Outcome: Ongoing - Unchanged Intervention: Identify and Manage Fall Risk Recent Flowsheet Documentation Taken 05/27/2024 0000 by Jakie Lacks, RN Safety Interventions: . environmental modification . fall reduction program maintained . lighting adjusted for tasks/safety . low bed . isolation precautions Intervention: Prevent Skin Injury Recent Flowsheet Documentation Taken 05/27/2024 0200 by Jakie Lacks, RN Positioning for Skin: Supine/Back Taken 05/27/2024 0000 by Jakie Lacks, RN Positioning for Skin: Supine/Back Device Skin Pressure Protection: . absorbent pad utilized/changed . adhesive use limited . positioning supports utilized . pressure points protected . skin-to-device areas padded . skin-to-skin areas padded Skin Protection: . adhesive use limited . incontinence pads utilized . skin-to-device areas padded . skin-to-skin areas padded . transparent dressing maintained . tubing/devices free from skin contact Goal: Optimal Comfort and Wellbeing Outcome: Ongoing - Unchanged Goal: Readiness for Transition of Care Outcome: Ongoing - Unchanged Goal: Rounds/Family Conference Outcome: Ongoing - Unchanged   Problem: Infection Goal: Absence of Infection Signs and Symptoms Outcome: Ongoing - Unchanged Intervention: Prevent or Manage Infection Recent Flowsheet Documentation Taken 05/27/2024 0000 by Jakie Lacks, RN Isolation Precautions: contact precautions maintained   Problem: Fall Injury Risk Goal: Absence of Fall and Fall-Related Injury Outcome: Ongoing - Unchanged Intervention: Promote Injury-Free Environment Recent Flowsheet Documentation Taken 05/27/2024  0000 by Jakie Lacks, RN Safety Interventions: . environmental modification . fall reduction program maintained . lighting adjusted for tasks/safety . low bed . isolation precautions   Problem: Wound Goal: Optimal Coping Outcome: Ongoing - Unchanged Goal: Optimal Functional Ability Outcome: Ongoing - Unchanged Intervention: Optimize Functional Ability Recent Flowsheet Documentation Taken 05/27/2024 0000 by Jakie Lacks, RN Activity Management: up ad lib Goal: Absence of Infection Signs and Symptoms Outcome: Ongoing - Unchanged Intervention: Prevent or Manage Infection Recent Flowsheet Documentation Taken 05/27/2024 0000 by Jakie Lacks, RN Isolation Precautions: contact precautions maintained Goal: Improved Oral Intake Outcome: Ongoing - Unchanged Goal: Optimal Pain Control and Function Outcome: Ongoing - Unchanged Goal: Skin Health and Integrity Outcome: Ongoing - Unchanged Intervention: Optimize Skin Protection Recent Flowsheet Documentation Taken 05/27/2024 0000 by Jakie Lacks, RN Activity Management: up ad lib Pressure Reduction Techniques: frequent weight shift encouraged Head of Bed (HOB) Positioning: HOB at 30-45 degrees Pressure Reduction Devices: . positioning supports utilized . pressure-redistributing mattress utilized Skin Protection: . adhesive use limited . incontinence pads utilized . skin-to-device areas padded . skin-to-skin areas padded . transparent dressing maintained . tubing/devices free from skin contact Goal: Optimal Wound Healing Outcome: Ongoing - Unchanged   Problem: Self-Care Deficit Goal: Improved Ability to Complete Activities of Daily Living Outcome: Ongoing - Unchanged

## 2024-05-28 ENCOUNTER — Encounter: Admitting: Physician Assistant

## 2024-05-28 ENCOUNTER — Other Ambulatory Visit: Payer: Self-pay | Admitting: Family Medicine

## 2024-05-28 DIAGNOSIS — K219 Gastro-esophageal reflux disease without esophagitis: Secondary | ICD-10-CM

## 2024-05-31 ENCOUNTER — Encounter: Admitting: Physician Assistant

## 2024-05-31 NOTE — Telephone Encounter (Signed)
 Requested medications are due for refill today.  yes  Requested medications are on the active medications list.  yes  Last refill. 05/05/2024 #30 0 rf  Future visit scheduled.   no  Notes to clinic.  Pt last seen 08/29/2023 - pt already given a courtesy refill. Pt has missed scheduled appts.     Requested Prescriptions  Pending Prescriptions Disp Refills   pantoprazole  (PROTONIX ) 40 MG tablet [Pharmacy Med Name: PANTOPRAZOLE  SOD DR 40 MG TAB] 90 tablet 1    Sig: TAKE 1 TABLET (40 MG TOTAL) BY MOUTH TWICE A DAY BEFORE MEALS     Gastroenterology: Proton Pump Inhibitors Failed - 05/31/2024 11:38 AM      Failed - Valid encounter within last 12 months    Recent Outpatient Visits   None     Future Appointments             In 3 months Stoioff, Glendia BROCKS, MD [facility]

## 2024-06-01 ENCOUNTER — Encounter: Admitting: Physician Assistant

## 2024-06-01 DIAGNOSIS — Z4789 Encounter for other orthopedic aftercare: Secondary | ICD-10-CM | POA: Diagnosis not present

## 2024-06-01 DIAGNOSIS — L0390 Cellulitis, unspecified: Secondary | ICD-10-CM | POA: Diagnosis not present

## 2024-06-01 DIAGNOSIS — R7881 Bacteremia: Secondary | ICD-10-CM | POA: Diagnosis not present

## 2024-06-01 DIAGNOSIS — L03032 Cellulitis of left toe: Secondary | ICD-10-CM | POA: Diagnosis not present

## 2024-06-01 DIAGNOSIS — B9562 Methicillin resistant Staphylococcus aureus infection as the cause of diseases classified elsewhere: Secondary | ICD-10-CM | POA: Diagnosis not present

## 2024-06-02 ENCOUNTER — Encounter: Admitting: Physician Assistant

## 2024-06-03 ENCOUNTER — Encounter: Admitting: Physician Assistant

## 2024-06-04 ENCOUNTER — Encounter: Admitting: Physician Assistant

## 2024-06-05 DIAGNOSIS — L03032 Cellulitis of left toe: Secondary | ICD-10-CM | POA: Diagnosis not present

## 2024-06-05 DIAGNOSIS — B9562 Methicillin resistant Staphylococcus aureus infection as the cause of diseases classified elsewhere: Secondary | ICD-10-CM | POA: Diagnosis not present

## 2024-06-05 DIAGNOSIS — R7881 Bacteremia: Secondary | ICD-10-CM | POA: Diagnosis not present

## 2024-06-07 ENCOUNTER — Encounter: Admitting: Physician Assistant

## 2024-06-07 ENCOUNTER — Encounter: Attending: Physician Assistant | Admitting: Physician Assistant

## 2024-06-07 DIAGNOSIS — L97514 Non-pressure chronic ulcer of other part of right foot with necrosis of bone: Secondary | ICD-10-CM | POA: Diagnosis not present

## 2024-06-07 DIAGNOSIS — E1169 Type 2 diabetes mellitus with other specified complication: Secondary | ICD-10-CM | POA: Diagnosis not present

## 2024-06-07 DIAGNOSIS — M86671 Other chronic osteomyelitis, right ankle and foot: Secondary | ICD-10-CM | POA: Insufficient documentation

## 2024-06-07 DIAGNOSIS — E11621 Type 2 diabetes mellitus with foot ulcer: Secondary | ICD-10-CM | POA: Insufficient documentation

## 2024-06-07 DIAGNOSIS — E1143 Type 2 diabetes mellitus with diabetic autonomic (poly)neuropathy: Secondary | ICD-10-CM | POA: Insufficient documentation

## 2024-06-07 DIAGNOSIS — I10 Essential (primary) hypertension: Secondary | ICD-10-CM | POA: Diagnosis not present

## 2024-06-07 LAB — GLUCOSE, CAPILLARY
Glucose-Capillary: 105 mg/dL — ABNORMAL HIGH (ref 70–99)
Glucose-Capillary: 81 mg/dL (ref 70–99)

## 2024-06-08 ENCOUNTER — Encounter: Admitting: Physician Assistant

## 2024-06-08 ENCOUNTER — Other Ambulatory Visit: Payer: Self-pay | Admitting: Family Medicine

## 2024-06-08 DIAGNOSIS — L03032 Cellulitis of left toe: Secondary | ICD-10-CM | POA: Diagnosis not present

## 2024-06-08 DIAGNOSIS — B9562 Methicillin resistant Staphylococcus aureus infection as the cause of diseases classified elsewhere: Secondary | ICD-10-CM | POA: Diagnosis not present

## 2024-06-08 DIAGNOSIS — I10 Essential (primary) hypertension: Secondary | ICD-10-CM | POA: Diagnosis not present

## 2024-06-08 DIAGNOSIS — Z4789 Encounter for other orthopedic aftercare: Secondary | ICD-10-CM | POA: Diagnosis not present

## 2024-06-08 DIAGNOSIS — M86671 Other chronic osteomyelitis, right ankle and foot: Secondary | ICD-10-CM | POA: Diagnosis not present

## 2024-06-08 DIAGNOSIS — E11621 Type 2 diabetes mellitus with foot ulcer: Secondary | ICD-10-CM | POA: Diagnosis not present

## 2024-06-08 DIAGNOSIS — E1169 Type 2 diabetes mellitus with other specified complication: Secondary | ICD-10-CM | POA: Diagnosis not present

## 2024-06-08 DIAGNOSIS — L0390 Cellulitis, unspecified: Secondary | ICD-10-CM | POA: Diagnosis not present

## 2024-06-08 DIAGNOSIS — R7881 Bacteremia: Secondary | ICD-10-CM | POA: Diagnosis not present

## 2024-06-08 DIAGNOSIS — E1143 Type 2 diabetes mellitus with diabetic autonomic (poly)neuropathy: Secondary | ICD-10-CM | POA: Diagnosis not present

## 2024-06-08 DIAGNOSIS — L97514 Non-pressure chronic ulcer of other part of right foot with necrosis of bone: Secondary | ICD-10-CM | POA: Diagnosis not present

## 2024-06-08 LAB — GLUCOSE, CAPILLARY
Glucose-Capillary: 123 mg/dL — ABNORMAL HIGH (ref 70–99)
Glucose-Capillary: 100 mg/dL — ABNORMAL HIGH (ref 70–99)

## 2024-06-09 ENCOUNTER — Encounter: Admitting: Physician Assistant

## 2024-06-09 ENCOUNTER — Encounter: Payer: Self-pay | Admitting: Family Medicine

## 2024-06-09 DIAGNOSIS — E11621 Type 2 diabetes mellitus with foot ulcer: Secondary | ICD-10-CM | POA: Diagnosis not present

## 2024-06-09 DIAGNOSIS — L97514 Non-pressure chronic ulcer of other part of right foot with necrosis of bone: Secondary | ICD-10-CM | POA: Diagnosis not present

## 2024-06-09 DIAGNOSIS — M86671 Other chronic osteomyelitis, right ankle and foot: Secondary | ICD-10-CM | POA: Diagnosis not present

## 2024-06-09 DIAGNOSIS — E1169 Type 2 diabetes mellitus with other specified complication: Secondary | ICD-10-CM | POA: Diagnosis not present

## 2024-06-09 DIAGNOSIS — M8618 Other acute osteomyelitis, other site: Secondary | ICD-10-CM | POA: Diagnosis not present

## 2024-06-09 DIAGNOSIS — I10 Essential (primary) hypertension: Secondary | ICD-10-CM | POA: Diagnosis not present

## 2024-06-09 DIAGNOSIS — E1143 Type 2 diabetes mellitus with diabetic autonomic (poly)neuropathy: Secondary | ICD-10-CM | POA: Diagnosis not present

## 2024-06-09 DIAGNOSIS — L97513 Non-pressure chronic ulcer of other part of right foot with necrosis of muscle: Secondary | ICD-10-CM | POA: Diagnosis not present

## 2024-06-09 LAB — GLUCOSE, CAPILLARY
Glucose-Capillary: 102 mg/dL — ABNORMAL HIGH (ref 70–99)
Glucose-Capillary: 80 mg/dL (ref 70–99)
Glucose-Capillary: 102 mg/dL — ABNORMAL HIGH (ref 70–99)

## 2024-06-09 NOTE — Telephone Encounter (Signed)
 Requested Prescriptions  Pending Prescriptions Disp Refills   ACCU-CHEK GUIDE TEST test strip [Pharmacy Med Name: ACCU-CHEK GUIDE TEST STRIP] 100 strip 0    Sig: 1 EACH BY IN VITRO ROUTE IN THE MORNING, AT NOON, AND AT BEDTIME.     There is no refill protocol information for this order

## 2024-06-10 ENCOUNTER — Encounter: Admitting: Physician Assistant

## 2024-06-10 DIAGNOSIS — L97519 Non-pressure chronic ulcer of other part of right foot with unspecified severity: Secondary | ICD-10-CM | POA: Diagnosis not present

## 2024-06-10 DIAGNOSIS — M79671 Pain in right foot: Secondary | ICD-10-CM | POA: Diagnosis not present

## 2024-06-10 DIAGNOSIS — E08621 Diabetes mellitus due to underlying condition with foot ulcer: Secondary | ICD-10-CM | POA: Diagnosis not present

## 2024-06-10 DIAGNOSIS — L97512 Non-pressure chronic ulcer of other part of right foot with fat layer exposed: Secondary | ICD-10-CM | POA: Diagnosis not present

## 2024-06-10 DIAGNOSIS — L97511 Non-pressure chronic ulcer of other part of right foot limited to breakdown of skin: Secondary | ICD-10-CM | POA: Diagnosis not present

## 2024-06-10 DIAGNOSIS — M7989 Other specified soft tissue disorders: Secondary | ICD-10-CM | POA: Diagnosis not present

## 2024-06-10 DIAGNOSIS — Z09 Encounter for follow-up examination after completed treatment for conditions other than malignant neoplasm: Secondary | ICD-10-CM | POA: Diagnosis not present

## 2024-06-10 DIAGNOSIS — M2021 Hallux rigidus, right foot: Secondary | ICD-10-CM | POA: Diagnosis not present

## 2024-06-10 DIAGNOSIS — M205X1 Other deformities of toe(s) (acquired), right foot: Secondary | ICD-10-CM | POA: Diagnosis not present

## 2024-06-10 DIAGNOSIS — S81801A Unspecified open wound, right lower leg, initial encounter: Secondary | ICD-10-CM | POA: Diagnosis not present

## 2024-06-11 ENCOUNTER — Encounter: Admitting: Physician Assistant

## 2024-06-11 DIAGNOSIS — M5412 Radiculopathy, cervical region: Secondary | ICD-10-CM | POA: Diagnosis not present

## 2024-06-11 DIAGNOSIS — M961 Postlaminectomy syndrome, not elsewhere classified: Secondary | ICD-10-CM | POA: Diagnosis not present

## 2024-06-11 DIAGNOSIS — G894 Chronic pain syndrome: Secondary | ICD-10-CM | POA: Diagnosis not present

## 2024-06-12 DIAGNOSIS — L03032 Cellulitis of left toe: Secondary | ICD-10-CM | POA: Diagnosis not present

## 2024-06-12 DIAGNOSIS — B9562 Methicillin resistant Staphylococcus aureus infection as the cause of diseases classified elsewhere: Secondary | ICD-10-CM | POA: Diagnosis not present

## 2024-06-12 DIAGNOSIS — R7881 Bacteremia: Secondary | ICD-10-CM | POA: Diagnosis not present

## 2024-06-14 ENCOUNTER — Encounter: Admitting: Physician Assistant

## 2024-06-14 DIAGNOSIS — I10 Essential (primary) hypertension: Secondary | ICD-10-CM | POA: Diagnosis not present

## 2024-06-14 DIAGNOSIS — E1143 Type 2 diabetes mellitus with diabetic autonomic (poly)neuropathy: Secondary | ICD-10-CM | POA: Diagnosis not present

## 2024-06-14 DIAGNOSIS — L97514 Non-pressure chronic ulcer of other part of right foot with necrosis of bone: Secondary | ICD-10-CM | POA: Diagnosis not present

## 2024-06-14 DIAGNOSIS — M86671 Other chronic osteomyelitis, right ankle and foot: Secondary | ICD-10-CM | POA: Diagnosis not present

## 2024-06-14 DIAGNOSIS — E1169 Type 2 diabetes mellitus with other specified complication: Secondary | ICD-10-CM | POA: Diagnosis not present

## 2024-06-14 DIAGNOSIS — E11621 Type 2 diabetes mellitus with foot ulcer: Secondary | ICD-10-CM | POA: Diagnosis not present

## 2024-06-14 LAB — GLUCOSE, CAPILLARY
Glucose-Capillary: 114 mg/dL — ABNORMAL HIGH (ref 70–99)
Glucose-Capillary: 89 mg/dL (ref 70–99)

## 2024-06-15 ENCOUNTER — Encounter: Admitting: Physician Assistant

## 2024-06-15 DIAGNOSIS — L97514 Non-pressure chronic ulcer of other part of right foot with necrosis of bone: Secondary | ICD-10-CM | POA: Diagnosis not present

## 2024-06-15 DIAGNOSIS — L03032 Cellulitis of left toe: Secondary | ICD-10-CM | POA: Diagnosis not present

## 2024-06-15 DIAGNOSIS — B9562 Methicillin resistant Staphylococcus aureus infection as the cause of diseases classified elsewhere: Secondary | ICD-10-CM | POA: Diagnosis not present

## 2024-06-15 DIAGNOSIS — L0390 Cellulitis, unspecified: Secondary | ICD-10-CM | POA: Diagnosis not present

## 2024-06-15 DIAGNOSIS — R7881 Bacteremia: Secondary | ICD-10-CM | POA: Diagnosis not present

## 2024-06-15 DIAGNOSIS — I10 Essential (primary) hypertension: Secondary | ICD-10-CM | POA: Diagnosis not present

## 2024-06-15 DIAGNOSIS — Z4789 Encounter for other orthopedic aftercare: Secondary | ICD-10-CM | POA: Diagnosis not present

## 2024-06-15 DIAGNOSIS — M86671 Other chronic osteomyelitis, right ankle and foot: Secondary | ICD-10-CM | POA: Diagnosis not present

## 2024-06-15 DIAGNOSIS — E1143 Type 2 diabetes mellitus with diabetic autonomic (poly)neuropathy: Secondary | ICD-10-CM | POA: Diagnosis not present

## 2024-06-15 DIAGNOSIS — E1169 Type 2 diabetes mellitus with other specified complication: Secondary | ICD-10-CM | POA: Diagnosis not present

## 2024-06-15 DIAGNOSIS — E11621 Type 2 diabetes mellitus with foot ulcer: Secondary | ICD-10-CM | POA: Diagnosis not present

## 2024-06-15 LAB — GLUCOSE, CAPILLARY
Glucose-Capillary: 111 mg/dL — ABNORMAL HIGH (ref 70–99)
Glucose-Capillary: 99 mg/dL (ref 70–99)

## 2024-06-16 ENCOUNTER — Encounter: Admitting: Physician Assistant

## 2024-06-16 DIAGNOSIS — I10 Essential (primary) hypertension: Secondary | ICD-10-CM | POA: Diagnosis not present

## 2024-06-16 DIAGNOSIS — E1169 Type 2 diabetes mellitus with other specified complication: Secondary | ICD-10-CM | POA: Diagnosis not present

## 2024-06-16 DIAGNOSIS — L97514 Non-pressure chronic ulcer of other part of right foot with necrosis of bone: Secondary | ICD-10-CM | POA: Diagnosis not present

## 2024-06-16 DIAGNOSIS — M86671 Other chronic osteomyelitis, right ankle and foot: Secondary | ICD-10-CM | POA: Diagnosis not present

## 2024-06-16 DIAGNOSIS — E1143 Type 2 diabetes mellitus with diabetic autonomic (poly)neuropathy: Secondary | ICD-10-CM | POA: Diagnosis not present

## 2024-06-16 DIAGNOSIS — E11621 Type 2 diabetes mellitus with foot ulcer: Secondary | ICD-10-CM | POA: Diagnosis not present

## 2024-06-16 LAB — GLUCOSE, CAPILLARY
Glucose-Capillary: 142 mg/dL — ABNORMAL HIGH (ref 70–99)
Glucose-Capillary: 94 mg/dL (ref 70–99)

## 2024-06-17 ENCOUNTER — Encounter: Admitting: Physician Assistant

## 2024-06-17 DIAGNOSIS — L97514 Non-pressure chronic ulcer of other part of right foot with necrosis of bone: Secondary | ICD-10-CM | POA: Diagnosis not present

## 2024-06-17 DIAGNOSIS — M86671 Other chronic osteomyelitis, right ankle and foot: Secondary | ICD-10-CM | POA: Diagnosis not present

## 2024-06-17 DIAGNOSIS — E1143 Type 2 diabetes mellitus with diabetic autonomic (poly)neuropathy: Secondary | ICD-10-CM | POA: Diagnosis not present

## 2024-06-17 DIAGNOSIS — E1169 Type 2 diabetes mellitus with other specified complication: Secondary | ICD-10-CM | POA: Diagnosis not present

## 2024-06-17 DIAGNOSIS — I10 Essential (primary) hypertension: Secondary | ICD-10-CM | POA: Diagnosis not present

## 2024-06-17 DIAGNOSIS — E11621 Type 2 diabetes mellitus with foot ulcer: Secondary | ICD-10-CM | POA: Diagnosis not present

## 2024-06-17 LAB — GLUCOSE, CAPILLARY
Glucose-Capillary: 115 mg/dL — ABNORMAL HIGH (ref 70–99)
Glucose-Capillary: 99 mg/dL (ref 70–99)

## 2024-06-18 ENCOUNTER — Encounter: Admitting: Physician Assistant

## 2024-06-18 DIAGNOSIS — E1143 Type 2 diabetes mellitus with diabetic autonomic (poly)neuropathy: Secondary | ICD-10-CM | POA: Diagnosis not present

## 2024-06-18 DIAGNOSIS — E11621 Type 2 diabetes mellitus with foot ulcer: Secondary | ICD-10-CM | POA: Diagnosis not present

## 2024-06-18 DIAGNOSIS — L97514 Non-pressure chronic ulcer of other part of right foot with necrosis of bone: Secondary | ICD-10-CM | POA: Diagnosis not present

## 2024-06-18 DIAGNOSIS — I10 Essential (primary) hypertension: Secondary | ICD-10-CM | POA: Diagnosis not present

## 2024-06-18 DIAGNOSIS — E1169 Type 2 diabetes mellitus with other specified complication: Secondary | ICD-10-CM | POA: Diagnosis not present

## 2024-06-18 DIAGNOSIS — M86671 Other chronic osteomyelitis, right ankle and foot: Secondary | ICD-10-CM | POA: Diagnosis not present

## 2024-06-18 LAB — GLUCOSE, CAPILLARY
Glucose-Capillary: 131 mg/dL — ABNORMAL HIGH (ref 70–99)
Glucose-Capillary: 113 mg/dL — ABNORMAL HIGH (ref 70–99)

## 2024-06-19 DIAGNOSIS — B9562 Methicillin resistant Staphylococcus aureus infection as the cause of diseases classified elsewhere: Secondary | ICD-10-CM | POA: Diagnosis not present

## 2024-06-19 DIAGNOSIS — L03032 Cellulitis of left toe: Secondary | ICD-10-CM | POA: Diagnosis not present

## 2024-06-19 DIAGNOSIS — R7881 Bacteremia: Secondary | ICD-10-CM | POA: Diagnosis not present

## 2024-06-21 ENCOUNTER — Encounter: Admitting: Physician Assistant

## 2024-06-22 ENCOUNTER — Encounter: Admitting: Physician Assistant

## 2024-06-22 DIAGNOSIS — Z4789 Encounter for other orthopedic aftercare: Secondary | ICD-10-CM | POA: Diagnosis not present

## 2024-06-22 DIAGNOSIS — L97514 Non-pressure chronic ulcer of other part of right foot with necrosis of bone: Secondary | ICD-10-CM | POA: Diagnosis not present

## 2024-06-22 DIAGNOSIS — L03032 Cellulitis of left toe: Secondary | ICD-10-CM | POA: Diagnosis not present

## 2024-06-22 DIAGNOSIS — B9562 Methicillin resistant Staphylococcus aureus infection as the cause of diseases classified elsewhere: Secondary | ICD-10-CM | POA: Diagnosis not present

## 2024-06-22 DIAGNOSIS — E1169 Type 2 diabetes mellitus with other specified complication: Secondary | ICD-10-CM | POA: Diagnosis not present

## 2024-06-22 DIAGNOSIS — M86671 Other chronic osteomyelitis, right ankle and foot: Secondary | ICD-10-CM | POA: Diagnosis not present

## 2024-06-22 DIAGNOSIS — E1143 Type 2 diabetes mellitus with diabetic autonomic (poly)neuropathy: Secondary | ICD-10-CM | POA: Diagnosis not present

## 2024-06-22 DIAGNOSIS — R7881 Bacteremia: Secondary | ICD-10-CM | POA: Diagnosis not present

## 2024-06-22 DIAGNOSIS — L0390 Cellulitis, unspecified: Secondary | ICD-10-CM | POA: Diagnosis not present

## 2024-06-22 DIAGNOSIS — E11621 Type 2 diabetes mellitus with foot ulcer: Secondary | ICD-10-CM | POA: Diagnosis not present

## 2024-06-22 DIAGNOSIS — I10 Essential (primary) hypertension: Secondary | ICD-10-CM | POA: Diagnosis not present

## 2024-06-22 LAB — GLUCOSE, CAPILLARY
Glucose-Capillary: 99 mg/dL (ref 70–99)
Glucose-Capillary: 113 mg/dL — ABNORMAL HIGH (ref 70–99)

## 2024-06-23 ENCOUNTER — Encounter: Admitting: Physician Assistant

## 2024-06-23 DIAGNOSIS — E1143 Type 2 diabetes mellitus with diabetic autonomic (poly)neuropathy: Secondary | ICD-10-CM | POA: Diagnosis not present

## 2024-06-23 DIAGNOSIS — I10 Essential (primary) hypertension: Secondary | ICD-10-CM | POA: Diagnosis not present

## 2024-06-23 DIAGNOSIS — E1169 Type 2 diabetes mellitus with other specified complication: Secondary | ICD-10-CM | POA: Diagnosis not present

## 2024-06-23 DIAGNOSIS — L97514 Non-pressure chronic ulcer of other part of right foot with necrosis of bone: Secondary | ICD-10-CM | POA: Diagnosis not present

## 2024-06-23 DIAGNOSIS — E11621 Type 2 diabetes mellitus with foot ulcer: Secondary | ICD-10-CM | POA: Diagnosis not present

## 2024-06-23 DIAGNOSIS — M86671 Other chronic osteomyelitis, right ankle and foot: Secondary | ICD-10-CM | POA: Diagnosis not present

## 2024-06-23 LAB — GLUCOSE, CAPILLARY
Glucose-Capillary: 152 mg/dL — ABNORMAL HIGH (ref 70–99)
Glucose-Capillary: 109 mg/dL — ABNORMAL HIGH (ref 70–99)

## 2024-06-24 ENCOUNTER — Encounter: Admitting: Physician Assistant

## 2024-06-24 DIAGNOSIS — E11621 Type 2 diabetes mellitus with foot ulcer: Secondary | ICD-10-CM | POA: Diagnosis not present

## 2024-06-24 DIAGNOSIS — L97514 Non-pressure chronic ulcer of other part of right foot with necrosis of bone: Secondary | ICD-10-CM | POA: Diagnosis not present

## 2024-06-24 DIAGNOSIS — M86671 Other chronic osteomyelitis, right ankle and foot: Secondary | ICD-10-CM | POA: Diagnosis not present

## 2024-06-24 DIAGNOSIS — I10 Essential (primary) hypertension: Secondary | ICD-10-CM | POA: Diagnosis not present

## 2024-06-24 DIAGNOSIS — E1143 Type 2 diabetes mellitus with diabetic autonomic (poly)neuropathy: Secondary | ICD-10-CM | POA: Diagnosis not present

## 2024-06-24 DIAGNOSIS — E1169 Type 2 diabetes mellitus with other specified complication: Secondary | ICD-10-CM | POA: Diagnosis not present

## 2024-06-24 LAB — GLUCOSE, CAPILLARY
Glucose-Capillary: 109 mg/dL — ABNORMAL HIGH (ref 70–99)
Glucose-Capillary: 89 mg/dL (ref 70–99)

## 2024-06-25 ENCOUNTER — Encounter: Admitting: Physician Assistant

## 2024-06-26 DIAGNOSIS — L0390 Cellulitis, unspecified: Secondary | ICD-10-CM | POA: Diagnosis not present

## 2024-06-26 DIAGNOSIS — R7881 Bacteremia: Secondary | ICD-10-CM | POA: Diagnosis not present

## 2024-06-28 ENCOUNTER — Encounter: Admitting: Internal Medicine

## 2024-06-29 ENCOUNTER — Encounter: Admitting: Internal Medicine

## 2024-06-29 ENCOUNTER — Encounter

## 2024-06-29 DIAGNOSIS — L97509 Non-pressure chronic ulcer of other part of unspecified foot with unspecified severity: Secondary | ICD-10-CM | POA: Diagnosis not present

## 2024-06-29 DIAGNOSIS — M9689 Other intraoperative and postprocedural complications and disorders of the musculoskeletal system: Secondary | ICD-10-CM | POA: Diagnosis not present

## 2024-06-29 DIAGNOSIS — E1143 Type 2 diabetes mellitus with diabetic autonomic (poly)neuropathy: Secondary | ICD-10-CM | POA: Diagnosis not present

## 2024-06-29 DIAGNOSIS — M86671 Other chronic osteomyelitis, right ankle and foot: Secondary | ICD-10-CM | POA: Diagnosis not present

## 2024-06-29 DIAGNOSIS — L97512 Non-pressure chronic ulcer of other part of right foot with fat layer exposed: Secondary | ICD-10-CM | POA: Diagnosis not present

## 2024-06-29 DIAGNOSIS — B9562 Methicillin resistant Staphylococcus aureus infection as the cause of diseases classified elsewhere: Secondary | ICD-10-CM | POA: Diagnosis not present

## 2024-06-29 DIAGNOSIS — E11621 Type 2 diabetes mellitus with foot ulcer: Secondary | ICD-10-CM | POA: Diagnosis not present

## 2024-06-29 DIAGNOSIS — I10 Essential (primary) hypertension: Secondary | ICD-10-CM | POA: Diagnosis not present

## 2024-06-29 DIAGNOSIS — L97514 Non-pressure chronic ulcer of other part of right foot with necrosis of bone: Secondary | ICD-10-CM | POA: Diagnosis not present

## 2024-06-29 DIAGNOSIS — Z452 Encounter for adjustment and management of vascular access device: Secondary | ICD-10-CM | POA: Diagnosis not present

## 2024-06-29 DIAGNOSIS — E1169 Type 2 diabetes mellitus with other specified complication: Secondary | ICD-10-CM | POA: Diagnosis not present

## 2024-06-29 DIAGNOSIS — R7881 Bacteremia: Secondary | ICD-10-CM | POA: Diagnosis not present

## 2024-06-29 LAB — GLUCOSE, CAPILLARY
Glucose-Capillary: 97 mg/dL (ref 70–99)
Glucose-Capillary: 98 mg/dL (ref 70–99)
Glucose-Capillary: 91 mg/dL (ref 70–99)

## 2024-06-30 ENCOUNTER — Encounter: Admitting: Internal Medicine

## 2024-06-30 DIAGNOSIS — L97514 Non-pressure chronic ulcer of other part of right foot with necrosis of bone: Secondary | ICD-10-CM | POA: Diagnosis not present

## 2024-06-30 DIAGNOSIS — E1169 Type 2 diabetes mellitus with other specified complication: Secondary | ICD-10-CM | POA: Diagnosis not present

## 2024-06-30 DIAGNOSIS — E11621 Type 2 diabetes mellitus with foot ulcer: Secondary | ICD-10-CM | POA: Diagnosis not present

## 2024-06-30 DIAGNOSIS — M86671 Other chronic osteomyelitis, right ankle and foot: Secondary | ICD-10-CM | POA: Diagnosis not present

## 2024-06-30 DIAGNOSIS — I10 Essential (primary) hypertension: Secondary | ICD-10-CM | POA: Diagnosis not present

## 2024-06-30 DIAGNOSIS — E1143 Type 2 diabetes mellitus with diabetic autonomic (poly)neuropathy: Secondary | ICD-10-CM | POA: Diagnosis not present

## 2024-06-30 LAB — GLUCOSE, CAPILLARY
Glucose-Capillary: 107 mg/dL — ABNORMAL HIGH (ref 70–99)
Glucose-Capillary: 111 mg/dL — ABNORMAL HIGH (ref 70–99)
Glucose-Capillary: 83 mg/dL (ref 70–99)

## 2024-07-01 ENCOUNTER — Encounter: Admitting: Internal Medicine

## 2024-07-01 DIAGNOSIS — I10 Essential (primary) hypertension: Secondary | ICD-10-CM | POA: Diagnosis not present

## 2024-07-01 DIAGNOSIS — E1169 Type 2 diabetes mellitus with other specified complication: Secondary | ICD-10-CM | POA: Diagnosis not present

## 2024-07-01 DIAGNOSIS — L97514 Non-pressure chronic ulcer of other part of right foot with necrosis of bone: Secondary | ICD-10-CM | POA: Diagnosis not present

## 2024-07-01 DIAGNOSIS — E11621 Type 2 diabetes mellitus with foot ulcer: Secondary | ICD-10-CM | POA: Diagnosis not present

## 2024-07-01 DIAGNOSIS — M86671 Other chronic osteomyelitis, right ankle and foot: Secondary | ICD-10-CM | POA: Diagnosis not present

## 2024-07-01 DIAGNOSIS — E1143 Type 2 diabetes mellitus with diabetic autonomic (poly)neuropathy: Secondary | ICD-10-CM | POA: Diagnosis not present

## 2024-07-01 LAB — GLUCOSE, CAPILLARY
Glucose-Capillary: 142 mg/dL — ABNORMAL HIGH (ref 70–99)
Glucose-Capillary: 170 mg/dL — ABNORMAL HIGH (ref 70–99)

## 2024-07-02 ENCOUNTER — Encounter: Admitting: Physician Assistant

## 2024-07-06 ENCOUNTER — Encounter: Admitting: Physician Assistant

## 2024-07-07 ENCOUNTER — Encounter: Attending: Physician Assistant | Admitting: Physician Assistant

## 2024-07-07 ENCOUNTER — Encounter: Admitting: Physician Assistant

## 2024-07-07 DIAGNOSIS — E1169 Type 2 diabetes mellitus with other specified complication: Secondary | ICD-10-CM | POA: Diagnosis not present

## 2024-07-07 DIAGNOSIS — M86671 Other chronic osteomyelitis, right ankle and foot: Secondary | ICD-10-CM | POA: Diagnosis not present

## 2024-07-07 DIAGNOSIS — E1143 Type 2 diabetes mellitus with diabetic autonomic (poly)neuropathy: Secondary | ICD-10-CM | POA: Diagnosis not present

## 2024-07-07 DIAGNOSIS — I10 Essential (primary) hypertension: Secondary | ICD-10-CM | POA: Insufficient documentation

## 2024-07-07 DIAGNOSIS — L97514 Non-pressure chronic ulcer of other part of right foot with necrosis of bone: Secondary | ICD-10-CM | POA: Insufficient documentation

## 2024-07-07 DIAGNOSIS — E11621 Type 2 diabetes mellitus with foot ulcer: Secondary | ICD-10-CM | POA: Diagnosis not present

## 2024-07-07 LAB — GLUCOSE, CAPILLARY: Glucose-Capillary: 116 mg/dL — ABNORMAL HIGH (ref 70–99)

## 2024-07-08 ENCOUNTER — Encounter: Admitting: Physician Assistant

## 2024-07-08 DIAGNOSIS — L97519 Non-pressure chronic ulcer of other part of right foot with unspecified severity: Secondary | ICD-10-CM | POA: Diagnosis not present

## 2024-07-08 DIAGNOSIS — I739 Peripheral vascular disease, unspecified: Secondary | ICD-10-CM | POA: Diagnosis not present

## 2024-07-08 DIAGNOSIS — L97512 Non-pressure chronic ulcer of other part of right foot with fat layer exposed: Secondary | ICD-10-CM | POA: Diagnosis not present

## 2024-07-08 DIAGNOSIS — E08621 Diabetes mellitus due to underlying condition with foot ulcer: Secondary | ICD-10-CM | POA: Diagnosis not present

## 2024-07-09 ENCOUNTER — Encounter: Admitting: Physician Assistant

## 2024-07-09 ENCOUNTER — Ambulatory Visit: Payer: Medicare Other

## 2024-07-09 DIAGNOSIS — Z0000 Encounter for general adult medical examination without abnormal findings: Secondary | ICD-10-CM | POA: Diagnosis not present

## 2024-07-09 DIAGNOSIS — M86671 Other chronic osteomyelitis, right ankle and foot: Secondary | ICD-10-CM | POA: Diagnosis not present

## 2024-07-09 DIAGNOSIS — E11621 Type 2 diabetes mellitus with foot ulcer: Secondary | ICD-10-CM | POA: Diagnosis not present

## 2024-07-09 DIAGNOSIS — L97514 Non-pressure chronic ulcer of other part of right foot with necrosis of bone: Secondary | ICD-10-CM | POA: Diagnosis not present

## 2024-07-09 DIAGNOSIS — I10 Essential (primary) hypertension: Secondary | ICD-10-CM | POA: Diagnosis not present

## 2024-07-09 DIAGNOSIS — E1143 Type 2 diabetes mellitus with diabetic autonomic (poly)neuropathy: Secondary | ICD-10-CM | POA: Diagnosis not present

## 2024-07-09 DIAGNOSIS — E1169 Type 2 diabetes mellitus with other specified complication: Secondary | ICD-10-CM | POA: Diagnosis not present

## 2024-07-09 LAB — GLUCOSE, CAPILLARY
Glucose-Capillary: 166 mg/dL — ABNORMAL HIGH (ref 70–99)
Glucose-Capillary: 109 mg/dL — ABNORMAL HIGH (ref 70–99)

## 2024-07-09 NOTE — Progress Notes (Signed)
 Subjective:   Dale Simkin. is a 63 y.o. who presents for a Medicare Wellness preventive visit.  As a reminder, Annual Wellness Visits don't include a physical exam, and some assessments may be limited, especially if this visit is performed virtually. We may recommend an in-person follow-up visit with your provider if needed.  Visit Complete: Virtual I connected with  Dale [name]. on 07/09/24 by a audio enabled telemedicine application and verified that I am speaking with the correct person using two identifiers.  Patient Location: Home  Provider Location: Home Office  I discussed the limitations of evaluation and management by telemedicine. The patient expressed understanding and agreed to proceed.  Vital Signs: Because this visit was a virtual/telehealth visit, some criteria may be missing or patient reported. Any vitals not documented were not able to be obtained and vitals that have been documented are patient reported.  VideoDeclined- This patient declined Librarian, academic. Therefore the visit was completed with audio only.  Persons Participating in Visit: Patient.  AWV Questionnaire: No: Patient Medicare AWV questionnaire was not completed prior to this visit.  Cardiac Risk Factors include: advanced age (>56men, >31 women);diabetes mellitus;male gender;hypertension;dyslipidemia;sedentary lifestyle     Objective:    There were no vitals filed for this visit. There is no height or weight on file to calculate BMI.     07/09/2024    4:13 PM 01/22/2024    9:56 AM 07/04/2023    9:32 AM 06/28/2022   11:07 AM 03/26/2022   12:07 PM 09/12/2021   10:52 AM 06/19/2021   11:48 AM  Advanced Directives  Does Patient Have a Medical Advance Directive? No No No No No No No  Would patient like information on creating a medical advance directive? No - Patient declined No - Patient declined No - Patient declined No - Patient declined  No - Patient  declined     Current Medications (verified) Outpatient Encounter Medications as of 07/09/2024  Medication Sig   ACCU-CHEK GUIDE TEST test strip 1 EACH BY IN VITRO ROUTE IN THE MORNING, AT NOON, AND AT BEDTIME.   acetaminophen  (TYLENOL ) 500 MG tablet Take 500 mg by mouth 2 (two) times daily as needed.   ALPRAZolam  (XANAX ) 0.5 MG tablet Take 0.5 mg by mouth 2 (two) times daily as needed.   amphetamine-dextroamphetamine (ADDERALL) 10 MG tablet    aspirin  EC 81 MG tablet Take 81 mg by mouth daily.   atorvastatin  (LIPITOR) 10 MG tablet TAKE 1 TABLET BY MOUTH EVERY DAY   b complex vitamins capsule Take by mouth.   Blood Glucose Monitoring Suppl (TRUE METRIX AIR GLUCOSE METER) w/Device KIT Check blood sugar 2 times daily   Blood Glucose Monitoring Suppl DEVI 1 each by Does not apply route in the morning, at noon, and at bedtime. May substitute to any manufacturer covered by patient's insurance.   buPROPion  (WELLBUTRIN  XL) 150 MG 24 hr tablet Take 150 mg by mouth daily.   Cholecalciferol 25 MCG (1000 UT) TBDP Take 2 tablets by mouth daily.   CINNAMON PO Take by mouth.   Coenzyme Q10 100 MG TABS Take 100 mg by mouth daily.   desoximetasone  (TOPICORT ) 0.25 % cream Apply 1 Application topically 2 (two) times daily as needed.   diclofenac sodium (VOLTAREN) 1 % GEL Apply topically.   dicyclomine  (BENTYL ) 20 MG tablet TAKE 1 TABLET (20 MG TOTAL) BY MOUTH 3 TIMES DAILY BEFORE MEALS. AS NEEDED FOR ABDOMINAL CRAMPING   DULoxetine  (CYMBALTA )  60 MG capsule Take 60 mg by mouth 2 (two) times daily.   gabapentin  (NEURONTIN ) 300 MG capsule Take 3 capsules by mouth 3 (three) times daily.   hydrOXYzine  (ATARAX ) 25 MG tablet Take 25 mg by mouth 2 (two) times daily as needed.   ibuprofen (ADVIL) 200 MG tablet Take 600 mg by mouth 2 (two) times daily as needed.   lidocaine  (LIDODERM ) 5 % SMARTSIG:Topical   metFORMIN  (GLUCOPHAGE ) 1000 MG tablet TAKE 1 TABLET (1,000 MG TOTAL) BY MOUTH TWICE A DAY WITH FOOD   Multiple  Vitamin (MULTIVITAMIN) tablet Take 1 tablet by mouth daily.   Omega-3 Fatty Acids (FISH OIL ) 1000 MG CAPS Take by mouth.   OZEMPIC , 1 MG/DOSE, 4 MG/3ML SOPN INJECT 1MG  INTO THE SKIN ONCE A WEEK   pantoprazole  (PROTONIX ) 40 MG tablet TAKE 1 TABLET (40 MG TOTAL) BY MOUTH TWICE A DAY BEFORE MEALS   propranolol  (INDERAL ) 40 MG tablet TAKE 1 TABLET BY MOUTH 3 TIMES DAILY.   SHINGRIX  injection Inject 0.5 mL into muscle for shingles vaccine. 2nd dose   sildenafil  (VIAGRA ) 100 MG tablet Take 1 tablet (100 mg total) by mouth daily as needed for erectile dysfunction.   tamsulosin  (FLOMAX ) 0.4 MG CAPS capsule TAKE 2 CAPSULES BY MOUTH EVERY DAY   testosterone  cypionate (DEPOTESTOSTERONE CYPIONATE) 200 MG/ML injection INJECT 1 ML (200 MG TOTAL) INTO THE MUSCLE EVERY 14 DAYS   tiZANidine (ZANAFLEX) 2 MG tablet Take 2 mg by mouth at bedtime as needed.   tiZANidine (ZANAFLEX) 4 MG tablet 1 tablet twice daily, May take an additional 1/2 tablet at bedtime as needed   traMADol (ULTRAM) 50 MG tablet Take 50 mg by mouth every 6 (six) hours as needed.   traZODone  (DESYREL ) 50 MG tablet Take 0.5 tablets (25 mg total) by mouth at bedtime as needed.   TRUE METRIX BLOOD GLUCOSE TEST test strip Check blood sugar 2 times a day   cyanocobalamin  1000 MCG tablet Take 1 tablet by mouth daily. (Patient not taking: Reported on 07/09/2024)   dutasteride  (AVODART ) 0.5 MG capsule TAKE 1 CAPSULE BY MOUTH EVERY DAY (Patient not taking: Reported on 07/09/2024)   Glucosamine-Chondroitin 250-200 MG TABS Take by mouth. (Patient not taking: Reported on 07/09/2024)   No facility-administered encounter medications on file as of 07/09/2024.    Allergies (verified) Benzoin, Doxycycline hyclate, Morphine, and Penicillins   History: Past Medical History:  Diagnosis Date   ADHD (attention deficit hyperactivity disorder)    Anxiety    Coronary artery disease    Coronary artery disease    Coronary atherosclerosis    Depression    GERD  (gastroesophageal reflux disease)    Headache    migrane   Heart disease    Hyperlipidemia    Hypertension    Hypertriglyceridemia    Hypogonadism in male    MVP (mitral valve prolapse)    Narcolepsy and cataplexy    Non-alcoholic fatty liver disease    Past Surgical History:  Procedure Laterality Date   COLONOSCOPY N/A 01/22/2024   Procedure: COLONOSCOPY;  Surgeon: Onita Elspeth Sharper, DO;  Location: [facility] ENDOSCOPY;  Service: Gastroenterology;  Laterality: N/A;  DM   COLONOSCOPY WITH PROPOFOL  N/A 11/18/2018   Procedure: COLONOSCOPY WITH PROPOFOL ;  Surgeon: Viktoria Lamar DASEN, MD;  Location: [facility] ENDOSCOPY;  Service: Endoscopy;  Laterality: N/A;   ESOPHAGOGASTRODUODENOSCOPY (EGD) WITH PROPOFOL  N/A 11/18/2018   Procedure: ESOPHAGOGASTRODUODENOSCOPY (EGD) WITH PROPOFOL ;  Surgeon: Viktoria Lamar DASEN, MD;  Location: [facility] ENDOSCOPY;  Service: Endoscopy;  Laterality: N/A;  pain pump implant and removal Bilateral    Dec. 21 2019   POLYPECTOMY  01/22/2024   Procedure: POLYPECTOMY;  Surgeon: Onita Elspeth Sharper, DO;  Location: [facility] ENDOSCOPY;  Service: Gastroenterology;;   SPINE SURGERY     Multiple surgeries, initial 2001, thoracic disc repair and recurrent revisions.   THROAT SURGERY     Family History  Problem Relation Age of Onset   Depression Mother    Anxiety disorder Mother    Diabetes Mother    Heart failure Mother    Heart attack Father    Diabetes Sister    Obesity Sister    Drug abuse Brother    Alcohol abuse Brother    Depression Sister    Anxiety disorder Sister    Drug abuse Sister    Anxiety disorder Sister    Seizures Sister    Diabetes Sister    Depression Sister    Social History   Socioeconomic History   Marital status: Married    Spouse name: Not on file   Number of children: Not on file   Years of education: 1 yr of college   Highest education level: Some college, no degree  Occupational History   Occupation: Disabled  Tobacco Use   Smoking status:  Former    Current packs/day: 0.00    Types: Cigarettes    Start date: 08/29/1979    Quit date: 11/04/2010    Years since quitting: 13.6   Smokeless tobacco: Former    Quit date: 11/19/2010   Tobacco comments:    1 to 1.5 packs per day  Vaping Use   Vaping status: Never Used  Substance and Sexual Activity   Alcohol use: No    Alcohol/week: 0.0 standard drinks of alcohol   Drug use: No   Sexual activity: Not Currently  Other Topics Concern   Not on file  Social History Narrative   Not on file   Social Drivers of Health   Financial Resource Strain: Low Risk  (07/09/2024)   Overall Financial Resource Strain (CARDIA)    Difficulty of Paying Living Expenses: Not hard at all  Food Insecurity: No Food Insecurity (07/09/2024)   Hunger Vital Sign    Worried About Running Out of Food in the Last Year: Never true    Ran Out of Food in the Last Year: Never true  Transportation Needs: No Transportation Needs (07/09/2024)   PRAPARE - Administrator, Civil Service (Medical): No    Lack of Transportation (Non-Medical): No  Physical Activity: Inactive (07/09/2024)   Exercise Vital Sign    Days of Exercise per Week: 0 days    Minutes of Exercise per Session: 0 min  Stress: Stress Concern Present (07/09/2024)   [employer] of Occupational Health - Occupational Stress Questionnaire    Feeling of Stress: Rather much  Social Connections: Moderately Isolated (07/09/2024)   Social Connection and Isolation Panel    Frequency of Communication with Friends and Family: Once a week    Frequency of Social Gatherings with Friends and Family: More than three times a week    Attends Religious Services: Never    Database administrator or Organizations: No    Attends Engineer, structural: Never    Marital Status: Married    Tobacco Counseling Counseling given: Not Answered Tobacco comments: 1 to 1.5 packs per day    Clinical Intake:  Pre-visit preparation completed: Yes  Pain :  No/denies pain     BMI -  recorded: 26.8 Nutritional Status: BMI 25 -29 Overweight Nutritional Risks: None Diabetes: Yes CBG done?: No Did pt. bring in CBG monitor from home?: No  Lab Results  Component Value Date   HGBA1C 5.6 08/22/2023   HGBA1C 5.3 02/27/2023   HGBA1C 6.0 (H) 08/07/2022     How often do you need to have someone help you when you read instructions, pamphlets, or other written materials from your doctor or pharmacy?: 1 - Never  Interpreter Needed?: No  Information entered by :: Dale [name], Dale [name]   Activities of Daily Living     07/09/2024    4:17 PM  In your present state of health, do you have any difficulty performing the following activities:  Hearing? 0  Vision? 0  Difficulty concentrating or making decisions? 1  Comment MEMORY MOST RECENTLY  Walking or climbing stairs? 1  Comment FOOT PAIN  Dressing or bathing? 0  Doing errands, shopping? 0  Preparing Food and eating ? N  Using the Toilet? N  In the past six months, have you accidently leaked urine? N  Do you have problems with loss of bowel control? N  Managing your Medications? N  Managing your Finances? N  Housekeeping or managing your Housekeeping? N    Patient Care Team: Edman Marsa PARAS, DO as PCP - General (Family Medicine) Alana Sharyle LABOR, RPH-CPP as Pharmacist Sharlot Agent, OD (Optometry)  I have updated your Care Teams any recent Medical Services you may have received from other providers in the past year.     Assessment:   This is a routine wellness examination for Dale [name].  Hearing/Vision screen Hearing Screening - Comments:: NO AIDS Vision Screening - Comments:: WEARS GLASSES ALL DAY- DR.SHADE- HAD VISIT LAST YEAR   Goals Addressed             This Visit's Progress    DIET - REDUCE SUGAR INTAKE         Depression Screen     07/09/2024    4:09 PM 07/04/2023    9:30 AM 02/27/2023   10:02 AM 09/18/2022   11:08 AM 08/14/2022   10:59 AM 06/28/2022    11:04 AM 03/28/2022   10:58 AM  PHQ 2/9 Scores  PHQ - 2 Score 4 0 3 2 4  0 3  PHQ- 9 Score 6 0 9 6 11  0 5    Fall Risk     07/09/2024    4:14 PM 07/04/2023    9:33 AM 02/27/2023   10:02 AM 09/18/2022   11:08 AM 08/14/2022   10:59 AM  Fall Risk   Falls in the past year? 1 1 1 1 1   Number falls in past yr: 1 1 1 1 1   Injury with Fall? 0 0 1 1 1   Risk for fall due to :  Other (Comment)  Impaired balance/gait;History of fall(s) Impaired balance/gait;History of fall(s)  Risk for fall due to: Comment  dizziness     Follow up Falls evaluation completed;Falls prevention discussed Falls evaluation completed;Falls prevention discussed  Falls evaluation completed  Falls evaluation completed;Falls prevention discussed      Data saved with a previous flowsheet row definition    MEDICARE RISK AT HOME:  Medicare Risk at Home Any stairs in or around the home?: Yes If so, are there any without handrails?: No Home free of loose throw rugs in walkways, pet beds, electrical cords, etc?: Yes Adequate lighting in your home to reduce risk of falls?: Yes Life alert?: No Use of  a cane, walker or w/c?: Yes (CANE WHEN FOOT AND LEG HURT) Grab bars in the bathroom?: Yes Shower chair or bench in shower?: Yes Elevated toilet seat or a handicapped toilet?: Yes  TIMED UP AND GO:  Was the test performed?  No  Cognitive Function: 6CIT completed        07/09/2024    4:22 PM 07/04/2023    9:36 AM  6CIT Screen  What Year? 0 points 0 points  What month? 0 points 0 points  What time? 0 points 0 points  Count back from 20 0 points 0 points  Months in reverse 0 points 0 points  Repeat phrase 0 points 0 points  Total Score 0 points 0 points    Immunizations Immunization History  Administered Date(s) Administered   Fluad Trivalent(High Dose 65+) 07/04/2023   Hep A / Hep B 10/21/2014, 01/04/2015, 06/21/2015   Influenza,inj,Quad PF,6+ Mos 06/26/2018, 07/13/2019, 08/06/2021, 08/07/2022    Influenza-Unspecified 06/28/2018, 08/22/2020   Moderna Covid-19 Fall Seasonal Vaccine 21yrs & older 03/12/2023   Moderna Covid-19 Vaccine Bivalent Booster 109yrs & up 08/20/2021   Moderna Sars-Covid-2 Vaccination 01/25/2020, 02/04/2020, 09/12/2020   PNEUMOCOCCAL CONJUGATE-20 09/18/2022   PPD Test 02/06/2021   Pneumococcal Polysaccharide-23 07/18/2016   Tdap 07/19/2015, 06/02/2019   Zoster Recombinant(Shingrix ) 08/14/2022, 03/12/2023    Screening Tests Health Maintenance  Topic Date Due   Lung Cancer Screening  Never done   OPHTHALMOLOGY EXAM  08/23/2023   HEMOGLOBIN A1C  02/20/2024   Influenza Vaccine  06/04/2024   COVID-19 Vaccine (6 - 2025-26 season) 07/05/2024   Diabetic kidney evaluation - eGFR measurement  08/21/2024   Diabetic kidney evaluation - Urine ACR  08/28/2024   FOOT EXAM  08/28/2024   Medicare Annual Wellness (AWV)  07/09/2025   Colonoscopy  01/21/2029   DTaP/Tdap/Td (3 - Td or Tdap) 06/01/2029   Pneumococcal Vaccine: 50+ Years  Completed   Hepatitis B Vaccines 19-59 Average Risk  Completed   Hepatitis C Screening  Completed   HIV Screening  Completed   Zoster Vaccines- Shingrix   Completed   HPV VACCINES  Aged Out   Meningococcal B Vaccine  Aged Out    Health Maintenance Items Addressed: UP TO DATE ON SHOTS & COLONOSCOPY  Additional Screening:  Vision Screening: Recommended annual ophthalmology exams for early detection of glaucoma and other disorders of the eye. Is the patient up to date with their annual eye exam?  Yes  Who is the provider or what is the name of the office in which the patient attends annual eye exams? DR.SHADE  Dental Screening: Recommended annual dental exams for proper oral hygiene  Community Resource Referral / Chronic Care Management: CRR required this visit?  No   CCM required this visit?  No   Plan:    I have personally reviewed and noted the following in the patient's chart:   Medical and social history Use of alcohol,  tobacco or illicit drugs  Current medications and supplements including opioid prescriptions. Patient is not currently taking opioid prescriptions. Functional ability and status Nutritional status Physical activity Advanced directives List of other physicians Hospitalizations, surgeries, and ER visits in previous 12 months Vitals Screenings to include cognitive, depression, and falls Referrals and appointments  In addition, I have reviewed and discussed with patient certain preventive protocols, quality metrics, and best practice recommendations. A written personalized care plan for preventive services as well as general preventive health recommendations were provided to patient.   Dale GORMAN [name], Dale [name]   [id number]  After Visit Summary: (MyChart) Due to this being a telephonic visit, the after visit summary with patients personalized plan was offered to patient via MyChart   Notes: Nothing significant to report at this time.

## 2024-07-09 NOTE — Patient Instructions (Signed)
 Dale [name],  Thank you for taking the time for your Medicare Wellness Visit. I appreciate your continued commitment to your health goals. Please review the care plan we discussed, and feel free to reach out if I can assist you further.  Medicare recommends these wellness visits once per year to help you and your care team stay ahead of potential health issues. These visits are designed to focus on prevention, allowing your provider to concentrate on managing your acute and chronic conditions during your regular appointments.  Please note that Annual Wellness Visits do not include a physical exam. Some assessments may be limited, especially if the visit was conducted virtually. If needed, we may recommend a separate in-person follow-up with your provider.  Ongoing Care Seeing your primary care provider every 3 to 6 months helps us  monitor your health and provide consistent, personalized care.   Referrals If a referral was made during today's visit and you haven't received any updates within two weeks, please contact the referred provider directly to check on the status.  Recommended Screenings:  Health Maintenance  Topic Date Due   Screening for Lung Cancer  Never done   Eye exam for diabetics  08/23/2023   Hemoglobin A1C  02/20/2024   Flu Shot  06/04/2024   COVID-19 Vaccine (6 - 2025-26 season) 07/05/2024   Yearly kidney function blood test for diabetes  08/21/2024   Yearly kidney health urinalysis for diabetes  08/28/2024   Complete foot exam   08/28/2024   Medicare Annual Wellness Visit  07/09/2025   Colon Cancer Screening  01/21/2029   DTaP/Tdap/Td vaccine (3 - Td or Tdap) 06/01/2029   Pneumococcal Vaccine for age over 21  Completed   Hepatitis B Vaccine  Completed   Hepatitis C Screening  Completed   HIV Screening  Completed   Zoster (Shingles) Vaccine  Completed   HPV Vaccine  Aged Out   Meningitis B Vaccine  Aged Out       07/09/2024    4:13 PM  Advanced Directives  Does  Patient Have a Medical Advance Directive? No  Would patient like information on creating a medical advance directive? No - Patient declined   Advance Care Planning is important because it: Ensures you receive medical care that aligns with your values, goals, and preferences. Provides guidance to your family and loved ones, reducing the emotional burden of decision-making during critical moments.  Vision: Annual vision screenings are recommended for early detection of glaucoma, cataracts, and diabetic retinopathy. These exams can also reveal signs of chronic conditions such as diabetes and high blood pressure.  Dental: Annual dental screenings help detect early signs of oral cancer, gum disease, and other conditions linked to overall health, including heart disease and diabetes.  Please see the attached documents for additional preventive care recommendations.   NEXT AWV ON 07/29/25 @ 4:00 PM BY PHONE

## 2024-07-12 ENCOUNTER — Other Ambulatory Visit: Payer: Self-pay | Admitting: Family Medicine

## 2024-07-12 ENCOUNTER — Encounter: Admitting: Physician Assistant

## 2024-07-12 DIAGNOSIS — M86671 Other chronic osteomyelitis, right ankle and foot: Secondary | ICD-10-CM | POA: Diagnosis not present

## 2024-07-12 DIAGNOSIS — I10 Essential (primary) hypertension: Secondary | ICD-10-CM | POA: Diagnosis not present

## 2024-07-12 DIAGNOSIS — E11621 Type 2 diabetes mellitus with foot ulcer: Secondary | ICD-10-CM | POA: Diagnosis not present

## 2024-07-12 DIAGNOSIS — E1169 Type 2 diabetes mellitus with other specified complication: Secondary | ICD-10-CM | POA: Diagnosis not present

## 2024-07-12 DIAGNOSIS — E1143 Type 2 diabetes mellitus with diabetic autonomic (poly)neuropathy: Secondary | ICD-10-CM | POA: Diagnosis not present

## 2024-07-12 DIAGNOSIS — L97514 Non-pressure chronic ulcer of other part of right foot with necrosis of bone: Secondary | ICD-10-CM | POA: Diagnosis not present

## 2024-07-12 LAB — GLUCOSE, CAPILLARY
Glucose-Capillary: 147 mg/dL — ABNORMAL HIGH (ref 70–99)
Glucose-Capillary: 111 mg/dL — ABNORMAL HIGH (ref 70–99)

## 2024-07-13 ENCOUNTER — Encounter: Admitting: Physician Assistant

## 2024-07-13 DIAGNOSIS — L97514 Non-pressure chronic ulcer of other part of right foot with necrosis of bone: Secondary | ICD-10-CM | POA: Diagnosis not present

## 2024-07-13 DIAGNOSIS — M86671 Other chronic osteomyelitis, right ankle and foot: Secondary | ICD-10-CM | POA: Diagnosis not present

## 2024-07-13 DIAGNOSIS — E11621 Type 2 diabetes mellitus with foot ulcer: Secondary | ICD-10-CM | POA: Diagnosis not present

## 2024-07-13 DIAGNOSIS — I10 Essential (primary) hypertension: Secondary | ICD-10-CM | POA: Diagnosis not present

## 2024-07-13 DIAGNOSIS — E1169 Type 2 diabetes mellitus with other specified complication: Secondary | ICD-10-CM | POA: Diagnosis not present

## 2024-07-13 DIAGNOSIS — E1143 Type 2 diabetes mellitus with diabetic autonomic (poly)neuropathy: Secondary | ICD-10-CM | POA: Diagnosis not present

## 2024-07-13 LAB — GLUCOSE, CAPILLARY
Glucose-Capillary: 109 mg/dL — ABNORMAL HIGH (ref 70–99)
Glucose-Capillary: 102 mg/dL — ABNORMAL HIGH (ref 70–99)

## 2024-07-13 NOTE — Telephone Encounter (Signed)
 Requested medication (s) are due for refill today:   Yes  Requested medication (s) are on the active medication list:   Yes  Future visit scheduled:   No      Last ordered: 06/09/2024 #100, 0 refills  No protocol assigned    Requested Prescriptions  Pending Prescriptions Disp Refills   ACCU-CHEK GUIDE TEST test strip [Pharmacy Med Name: ACCU-CHEK GUIDE TEST STRIP] 100 strip 0    Sig: 1 EACH BY IN VITRO ROUTE IN THE MORNING, AT NOON, AND AT BEDTIME.     There is no refill protocol information for this order

## 2024-07-14 ENCOUNTER — Encounter: Admitting: Physician Assistant

## 2024-07-14 DIAGNOSIS — E11621 Type 2 diabetes mellitus with foot ulcer: Secondary | ICD-10-CM | POA: Diagnosis not present

## 2024-07-14 DIAGNOSIS — M86671 Other chronic osteomyelitis, right ankle and foot: Secondary | ICD-10-CM | POA: Diagnosis not present

## 2024-07-14 DIAGNOSIS — E1169 Type 2 diabetes mellitus with other specified complication: Secondary | ICD-10-CM | POA: Diagnosis not present

## 2024-07-14 DIAGNOSIS — I10 Essential (primary) hypertension: Secondary | ICD-10-CM | POA: Diagnosis not present

## 2024-07-14 DIAGNOSIS — L97514 Non-pressure chronic ulcer of other part of right foot with necrosis of bone: Secondary | ICD-10-CM | POA: Diagnosis not present

## 2024-07-14 DIAGNOSIS — L97512 Non-pressure chronic ulcer of other part of right foot with fat layer exposed: Secondary | ICD-10-CM | POA: Diagnosis not present

## 2024-07-14 DIAGNOSIS — E1143 Type 2 diabetes mellitus with diabetic autonomic (poly)neuropathy: Secondary | ICD-10-CM | POA: Diagnosis not present

## 2024-07-14 LAB — GLUCOSE, CAPILLARY: Glucose-Capillary: 154 mg/dL — ABNORMAL HIGH (ref 70–99)

## 2024-07-15 ENCOUNTER — Encounter: Admitting: Physician Assistant

## 2024-07-15 DIAGNOSIS — E11621 Type 2 diabetes mellitus with foot ulcer: Secondary | ICD-10-CM | POA: Diagnosis not present

## 2024-07-15 DIAGNOSIS — I10 Essential (primary) hypertension: Secondary | ICD-10-CM | POA: Diagnosis not present

## 2024-07-15 DIAGNOSIS — E1169 Type 2 diabetes mellitus with other specified complication: Secondary | ICD-10-CM | POA: Diagnosis not present

## 2024-07-15 DIAGNOSIS — E1143 Type 2 diabetes mellitus with diabetic autonomic (poly)neuropathy: Secondary | ICD-10-CM | POA: Diagnosis not present

## 2024-07-15 DIAGNOSIS — M86671 Other chronic osteomyelitis, right ankle and foot: Secondary | ICD-10-CM | POA: Diagnosis not present

## 2024-07-15 DIAGNOSIS — L97514 Non-pressure chronic ulcer of other part of right foot with necrosis of bone: Secondary | ICD-10-CM | POA: Diagnosis not present

## 2024-07-15 LAB — GLUCOSE, CAPILLARY
Glucose-Capillary: 105 mg/dL — ABNORMAL HIGH (ref 70–99)
Glucose-Capillary: 156 mg/dL — ABNORMAL HIGH (ref 70–99)
Glucose-Capillary: 110 mg/dL — ABNORMAL HIGH (ref 70–99)

## 2024-07-16 ENCOUNTER — Encounter: Admitting: Physician Assistant

## 2024-07-19 ENCOUNTER — Encounter: Admitting: Physician Assistant

## 2024-07-20 ENCOUNTER — Encounter: Admitting: Physician Assistant

## 2024-07-21 ENCOUNTER — Ambulatory Visit: Admitting: Physician Assistant

## 2024-07-21 ENCOUNTER — Encounter: Admitting: Physician Assistant

## 2024-07-22 ENCOUNTER — Encounter: Admitting: Physician Assistant

## 2024-07-22 DIAGNOSIS — E11621 Type 2 diabetes mellitus with foot ulcer: Secondary | ICD-10-CM | POA: Diagnosis not present

## 2024-07-22 DIAGNOSIS — M86671 Other chronic osteomyelitis, right ankle and foot: Secondary | ICD-10-CM | POA: Diagnosis not present

## 2024-07-22 DIAGNOSIS — L97514 Non-pressure chronic ulcer of other part of right foot with necrosis of bone: Secondary | ICD-10-CM | POA: Diagnosis not present

## 2024-07-22 DIAGNOSIS — I10 Essential (primary) hypertension: Secondary | ICD-10-CM | POA: Diagnosis not present

## 2024-07-22 DIAGNOSIS — E1169 Type 2 diabetes mellitus with other specified complication: Secondary | ICD-10-CM | POA: Diagnosis not present

## 2024-07-22 DIAGNOSIS — E1143 Type 2 diabetes mellitus with diabetic autonomic (poly)neuropathy: Secondary | ICD-10-CM | POA: Diagnosis not present

## 2024-07-22 LAB — GLUCOSE, CAPILLARY
Glucose-Capillary: 161 mg/dL — ABNORMAL HIGH (ref 70–99)
Glucose-Capillary: 119 mg/dL — ABNORMAL HIGH (ref 70–99)

## 2024-07-23 ENCOUNTER — Encounter: Admitting: Physician Assistant

## 2024-07-23 DIAGNOSIS — M86671 Other chronic osteomyelitis, right ankle and foot: Secondary | ICD-10-CM | POA: Diagnosis not present

## 2024-07-23 DIAGNOSIS — E11621 Type 2 diabetes mellitus with foot ulcer: Secondary | ICD-10-CM | POA: Diagnosis not present

## 2024-07-23 DIAGNOSIS — E1143 Type 2 diabetes mellitus with diabetic autonomic (poly)neuropathy: Secondary | ICD-10-CM | POA: Diagnosis not present

## 2024-07-23 DIAGNOSIS — I10 Essential (primary) hypertension: Secondary | ICD-10-CM | POA: Diagnosis not present

## 2024-07-23 DIAGNOSIS — L97514 Non-pressure chronic ulcer of other part of right foot with necrosis of bone: Secondary | ICD-10-CM | POA: Diagnosis not present

## 2024-07-23 DIAGNOSIS — E1169 Type 2 diabetes mellitus with other specified complication: Secondary | ICD-10-CM | POA: Diagnosis not present

## 2024-07-23 LAB — GLUCOSE, CAPILLARY
Glucose-Capillary: 107 mg/dL — ABNORMAL HIGH (ref 70–99)
Glucose-Capillary: 101 mg/dL — ABNORMAL HIGH (ref 70–99)

## 2024-07-26 ENCOUNTER — Encounter: Admitting: Physician Assistant

## 2024-07-27 ENCOUNTER — Encounter: Admitting: Physician Assistant

## 2024-07-27 DIAGNOSIS — L97514 Non-pressure chronic ulcer of other part of right foot with necrosis of bone: Secondary | ICD-10-CM | POA: Diagnosis not present

## 2024-07-27 DIAGNOSIS — I10 Essential (primary) hypertension: Secondary | ICD-10-CM | POA: Diagnosis not present

## 2024-07-27 DIAGNOSIS — E1169 Type 2 diabetes mellitus with other specified complication: Secondary | ICD-10-CM | POA: Diagnosis not present

## 2024-07-27 DIAGNOSIS — E1143 Type 2 diabetes mellitus with diabetic autonomic (poly)neuropathy: Secondary | ICD-10-CM | POA: Diagnosis not present

## 2024-07-27 DIAGNOSIS — M86671 Other chronic osteomyelitis, right ankle and foot: Secondary | ICD-10-CM | POA: Diagnosis not present

## 2024-07-27 DIAGNOSIS — E11621 Type 2 diabetes mellitus with foot ulcer: Secondary | ICD-10-CM | POA: Diagnosis not present

## 2024-07-27 LAB — GLUCOSE, CAPILLARY
Glucose-Capillary: 128 mg/dL — ABNORMAL HIGH (ref 70–99)
Glucose-Capillary: 105 mg/dL — ABNORMAL HIGH (ref 70–99)

## 2024-07-28 ENCOUNTER — Encounter: Admitting: Physician Assistant

## 2024-07-28 ENCOUNTER — Telehealth: Payer: Self-pay

## 2024-07-28 DIAGNOSIS — L97514 Non-pressure chronic ulcer of other part of right foot with necrosis of bone: Secondary | ICD-10-CM | POA: Diagnosis not present

## 2024-07-28 DIAGNOSIS — E1143 Type 2 diabetes mellitus with diabetic autonomic (poly)neuropathy: Secondary | ICD-10-CM | POA: Diagnosis not present

## 2024-07-28 DIAGNOSIS — I10 Essential (primary) hypertension: Secondary | ICD-10-CM | POA: Diagnosis not present

## 2024-07-28 DIAGNOSIS — E1169 Type 2 diabetes mellitus with other specified complication: Secondary | ICD-10-CM | POA: Diagnosis not present

## 2024-07-28 DIAGNOSIS — L97512 Non-pressure chronic ulcer of other part of right foot with fat layer exposed: Secondary | ICD-10-CM | POA: Diagnosis not present

## 2024-07-28 DIAGNOSIS — E11621 Type 2 diabetes mellitus with foot ulcer: Secondary | ICD-10-CM | POA: Diagnosis not present

## 2024-07-28 DIAGNOSIS — M86671 Other chronic osteomyelitis, right ankle and foot: Secondary | ICD-10-CM | POA: Diagnosis not present

## 2024-07-28 LAB — GLUCOSE, CAPILLARY
Glucose-Capillary: 141 mg/dL — ABNORMAL HIGH (ref 70–99)
Glucose-Capillary: 124 mg/dL — ABNORMAL HIGH (ref 70–99)

## 2024-07-28 NOTE — Progress Notes (Signed)
 Dale [name] Dale [name].                                          MRN: [id number]   07/28/2024   The VBCI Quality Team Specialist reviewed this patient medical record for the purposes of chart review for care gap closure. The following were reviewed: chart review for care gap closure-glycemic status assessment.    VBCI Quality Team

## 2024-07-28 NOTE — Telephone Encounter (Signed)
 Completed refill form for Ozempic  (Novo Nordisk) and faxed providers office for review and signature.

## 2024-07-29 ENCOUNTER — Encounter: Admitting: Physician Assistant

## 2024-07-29 DIAGNOSIS — L97514 Non-pressure chronic ulcer of other part of right foot with necrosis of bone: Secondary | ICD-10-CM | POA: Diagnosis not present

## 2024-07-29 DIAGNOSIS — M86671 Other chronic osteomyelitis, right ankle and foot: Secondary | ICD-10-CM | POA: Diagnosis not present

## 2024-07-29 DIAGNOSIS — E1169 Type 2 diabetes mellitus with other specified complication: Secondary | ICD-10-CM | POA: Diagnosis not present

## 2024-07-29 DIAGNOSIS — E11621 Type 2 diabetes mellitus with foot ulcer: Secondary | ICD-10-CM | POA: Diagnosis not present

## 2024-07-29 DIAGNOSIS — E1143 Type 2 diabetes mellitus with diabetic autonomic (poly)neuropathy: Secondary | ICD-10-CM | POA: Diagnosis not present

## 2024-07-29 DIAGNOSIS — I10 Essential (primary) hypertension: Secondary | ICD-10-CM | POA: Diagnosis not present

## 2024-07-29 LAB — GLUCOSE, CAPILLARY
Glucose-Capillary: 169 mg/dL — ABNORMAL HIGH (ref 70–99)
Glucose-Capillary: 102 mg/dL — ABNORMAL HIGH (ref 70–99)

## 2024-07-30 ENCOUNTER — Encounter: Admitting: Physician Assistant

## 2024-07-30 DIAGNOSIS — L97514 Non-pressure chronic ulcer of other part of right foot with necrosis of bone: Secondary | ICD-10-CM | POA: Diagnosis not present

## 2024-07-30 DIAGNOSIS — E11621 Type 2 diabetes mellitus with foot ulcer: Secondary | ICD-10-CM | POA: Diagnosis not present

## 2024-07-30 DIAGNOSIS — M86671 Other chronic osteomyelitis, right ankle and foot: Secondary | ICD-10-CM | POA: Diagnosis not present

## 2024-07-30 DIAGNOSIS — E1143 Type 2 diabetes mellitus with diabetic autonomic (poly)neuropathy: Secondary | ICD-10-CM | POA: Diagnosis not present

## 2024-07-30 DIAGNOSIS — I10 Essential (primary) hypertension: Secondary | ICD-10-CM | POA: Diagnosis not present

## 2024-07-30 DIAGNOSIS — E1169 Type 2 diabetes mellitus with other specified complication: Secondary | ICD-10-CM | POA: Diagnosis not present

## 2024-07-30 LAB — GLUCOSE, CAPILLARY
Glucose-Capillary: 152 mg/dL — ABNORMAL HIGH (ref 70–99)
Glucose-Capillary: 90 mg/dL (ref 70–99)

## 2024-08-01 ENCOUNTER — Other Ambulatory Visit: Payer: Self-pay | Admitting: Family Medicine

## 2024-08-01 DIAGNOSIS — I10 Essential (primary) hypertension: Secondary | ICD-10-CM

## 2024-08-02 ENCOUNTER — Encounter: Admitting: Physician Assistant

## 2024-08-03 ENCOUNTER — Encounter: Payer: Self-pay | Admitting: Pharmacist

## 2024-08-03 ENCOUNTER — Other Ambulatory Visit: Payer: Self-pay | Admitting: Pharmacist

## 2024-08-03 ENCOUNTER — Encounter: Admitting: Physician Assistant

## 2024-08-03 DIAGNOSIS — E1140 Type 2 diabetes mellitus with diabetic neuropathy, unspecified: Secondary | ICD-10-CM

## 2024-08-03 DIAGNOSIS — E119 Type 2 diabetes mellitus without complications: Secondary | ICD-10-CM

## 2024-08-03 NOTE — Progress Notes (Signed)
   08/03/2024  Patient ID: Dale [name]., male   DOB: [birth year], 63 y.o.   MRN: [id number]  Patient enrolled in Ozempic  patient assistance program from [employer] through 11/03/2024  [employer] has announced that they will no longer offer Ozempic  to [employer] beneficiaries through their Patient Assistance Program in 2026.   Outreach to patient today and provide this update.    Counsel patient that Medicare's Annual Enrollment Period for 2026 starts 08/18/2024. Encourage patient to review deductibles formulary coverage and copay amounts (including for Ozempic ) between plans. Also share with patient the contact information for the [facility] Information Program [facility]) that can help with reviewing these Medicare plans Will also share this information with patient via MyChart message  From review of chart, note CPh[facility] [place] initiated a refill for patient's Ozempic  through [employer] patient assistance program on 07/28/2024  Sharyle Sia, PharmD, [place], CPP Clinical Pharmacist [facility] [phone]

## 2024-08-03 NOTE — Telephone Encounter (Signed)
 Requested medications are due for refill today.  yes  Requested medications are on the active medications list.  yes  Last refill. 04/27/2024 #270 0 rf  Future visit scheduled.   no  Notes to clinic.  Pt last seen 08/28/2024 - pt was to [facility] in 6 months. Pt did not keep  2 appts in April. Pt is more than 3 months overdue for an ov.    Requested Prescriptions  Pending Prescriptions Disp Refills   propranolol  (INDERAL ) 40 MG tablet [Pharmacy Med Name: PROPRANOLOL  40 MG TABLET] 270 tablet 0    Sig: TAKE 1 TABLET BY MOUTH 3 TIMES DAILY.     Cardiovascular:  Beta Blockers Passed - 08/03/2024 12:50 PM      Passed - Last BP in normal range    BP Readings from Last 1 Encounters:  01/22/24 113/77         Passed - Last Heart Rate in normal range    Pulse Readings from Last 1 Encounters:  01/22/24 64         Passed - Valid encounter within last 6 months    Recent Outpatient Visits   None     Future Appointments             In 1 month Stoioff, Glendia BROCKS, MD [facility]

## 2024-08-03 NOTE — Patient Instructions (Signed)
 [employer], the maker of Ozempic  and Rybelsus, has announced that they will no longer offer these medications to Medicare beneficiaries through their Patient Assistance Program in 2026.    This means that if you currently receive Ozempic  or Rybelsus at no cost through the program, starting in January 2026 you'll need to use your insurance to continue on these medicines.      Choosing a Medicare Plan   With Medicare's Annual Enrollment Period starting on October 15th, we encourage you to review formulary coverage and copay amounts for Ozempic  or Rybelsus, as costs can vary widely between plans. Starting on Wednesday, October 1st you can compare your Medicare plan options by going to the Plan Finder tool at [employer] ( TeleconferenceOnDemand.fr ).    There are Medicare Specialists through the [facility] Insurance Information Program ([place]) that can help you shop for Medicare plans.    [facility] toll free number: [phone] (Monday - Friday 8 am - 5 pm)   There are representatives located in each county:    [facility] [address]     [place]  [phone] Call for an appointment with [facility]     Maximum Out-of-Pocket and Prescription Payment Plan   In 2026, the maximum yearly out-of-pocket cost for medications is $2,100 for all Medicare beneficiaries. This means you will not have to pay more than $2,100 in total for all prescription medications filled on your Medicare plan in 2026. You may also choose to enroll in a Medicare Prescription Payment Plan through your chosen 2026 Medicare plan. This lets you spread out your prescription drug costs over the year instead of laying a large amount all at once at the pharmacy.    Sharyle Sia, PharmD, JAQUELINE, CPP Clinical Pharmacist [facility] [phone]

## 2024-08-04 ENCOUNTER — Encounter: Admitting: Physician Assistant

## 2024-08-04 ENCOUNTER — Ambulatory Visit: Admitting: Physician Assistant

## 2024-08-05 ENCOUNTER — Encounter: Admitting: Physician Assistant

## 2024-08-06 ENCOUNTER — Encounter: Admitting: Physician Assistant

## 2024-08-06 NOTE — Progress Notes (Signed)
 Dale [name] Dale [name].                                          MRN: [id number]   08/06/2024   The [facility] Quality Team Specialist reviewed this patient medical record for the purposes of chart review for care gap closure. The following were reviewed: chart review for care gap closure-diabetic eye exam.    [facility] Quality Team

## 2024-08-09 ENCOUNTER — Encounter: Admitting: Physician Assistant

## 2024-08-10 ENCOUNTER — Other Ambulatory Visit: Payer: Self-pay | Admitting: Medical Genetics

## 2024-08-10 ENCOUNTER — Encounter: Admitting: Physician Assistant

## 2024-08-11 ENCOUNTER — Encounter: Attending: Physician Assistant | Admitting: Physician Assistant

## 2024-08-11 ENCOUNTER — Encounter: Admitting: Physician Assistant

## 2024-08-11 DIAGNOSIS — M86641 Other chronic osteomyelitis, right hand: Secondary | ICD-10-CM | POA: Diagnosis not present

## 2024-08-11 DIAGNOSIS — I10 Essential (primary) hypertension: Secondary | ICD-10-CM | POA: Diagnosis not present

## 2024-08-11 DIAGNOSIS — E11621 Type 2 diabetes mellitus with foot ulcer: Secondary | ICD-10-CM | POA: Diagnosis not present

## 2024-08-11 DIAGNOSIS — L97515 Non-pressure chronic ulcer of other part of right foot with muscle involvement without evidence of necrosis: Secondary | ICD-10-CM | POA: Insufficient documentation

## 2024-08-11 DIAGNOSIS — E1151 Type 2 diabetes mellitus with diabetic peripheral angiopathy without gangrene: Secondary | ICD-10-CM | POA: Diagnosis not present

## 2024-08-11 DIAGNOSIS — M86671 Other chronic osteomyelitis, right ankle and foot: Secondary | ICD-10-CM | POA: Diagnosis not present

## 2024-08-11 LAB — GLUCOSE, CAPILLARY
Glucose-Capillary: 117 mg/dL — ABNORMAL HIGH (ref 70–99)
Glucose-Capillary: 99 mg/dL (ref 70–99)

## 2024-08-12 ENCOUNTER — Encounter: Admitting: Physician Assistant

## 2024-08-12 DIAGNOSIS — M86671 Other chronic osteomyelitis, right ankle and foot: Secondary | ICD-10-CM | POA: Diagnosis not present

## 2024-08-12 DIAGNOSIS — E11621 Type 2 diabetes mellitus with foot ulcer: Secondary | ICD-10-CM | POA: Diagnosis not present

## 2024-08-12 LAB — GLUCOSE, CAPILLARY
Glucose-Capillary: 115 mg/dL — ABNORMAL HIGH (ref 70–99)
Glucose-Capillary: 92 mg/dL (ref 70–99)
Glucose-Capillary: 91 mg/dL (ref 70–99)

## 2024-08-12 NOTE — Progress Notes (Signed)
 Dale [name] [name].                                          MRN: [id number]   08/12/2024   The [facility] Quality Team Specialist reviewed this patient medical record for the purposes of chart review for care gap closure. The following were reviewed: chart review for care gap closure-glycemic status assessment.    [facility] Quality Team

## 2024-08-13 ENCOUNTER — Encounter: Admitting: Physician Assistant

## 2024-08-13 DIAGNOSIS — M86671 Other chronic osteomyelitis, right ankle and foot: Secondary | ICD-10-CM | POA: Diagnosis not present

## 2024-08-13 DIAGNOSIS — E11621 Type 2 diabetes mellitus with foot ulcer: Secondary | ICD-10-CM | POA: Diagnosis not present

## 2024-08-13 LAB — GLUCOSE, CAPILLARY
Glucose-Capillary: 119 mg/dL — ABNORMAL HIGH (ref 70–99)
Glucose-Capillary: 138 mg/dL — ABNORMAL HIGH (ref 70–99)

## 2024-08-16 ENCOUNTER — Encounter: Admitting: Physician Assistant

## 2024-08-16 ENCOUNTER — Telehealth: Payer: Self-pay

## 2024-08-16 DIAGNOSIS — E11621 Type 2 diabetes mellitus with foot ulcer: Secondary | ICD-10-CM | POA: Diagnosis not present

## 2024-08-16 DIAGNOSIS — M86671 Other chronic osteomyelitis, right ankle and foot: Secondary | ICD-10-CM | POA: Diagnosis not present

## 2024-08-16 LAB — GLUCOSE, CAPILLARY
Glucose-Capillary: 126 mg/dL — ABNORMAL HIGH (ref 70–99)
Glucose-Capillary: 117 mg/dL — ABNORMAL HIGH (ref 70–99)

## 2024-08-16 NOTE — Telephone Encounter (Signed)
 Left message for patient to return call OK to advise    Ozempic  received. He can pickup in the office

## 2024-08-17 ENCOUNTER — Encounter: Admitting: Physician Assistant

## 2024-08-17 DIAGNOSIS — M86671 Other chronic osteomyelitis, right ankle and foot: Secondary | ICD-10-CM | POA: Diagnosis not present

## 2024-08-17 DIAGNOSIS — E11621 Type 2 diabetes mellitus with foot ulcer: Secondary | ICD-10-CM | POA: Diagnosis not present

## 2024-08-17 LAB — GLUCOSE, CAPILLARY
Glucose-Capillary: 133 mg/dL — ABNORMAL HIGH (ref 70–99)
Glucose-Capillary: 109 mg/dL — ABNORMAL HIGH (ref 70–99)

## 2024-08-18 ENCOUNTER — Encounter: Admitting: Physician Assistant

## 2024-08-18 DIAGNOSIS — M86671 Other chronic osteomyelitis, right ankle and foot: Secondary | ICD-10-CM | POA: Diagnosis not present

## 2024-08-18 DIAGNOSIS — E11621 Type 2 diabetes mellitus with foot ulcer: Secondary | ICD-10-CM | POA: Diagnosis not present

## 2024-08-18 DIAGNOSIS — L97512 Non-pressure chronic ulcer of other part of right foot with fat layer exposed: Secondary | ICD-10-CM | POA: Diagnosis not present

## 2024-08-18 LAB — GLUCOSE, CAPILLARY
Glucose-Capillary: 130 mg/dL — ABNORMAL HIGH (ref 70–99)
Glucose-Capillary: 116 mg/dL — ABNORMAL HIGH (ref 70–99)

## 2024-08-19 ENCOUNTER — Encounter: Admitting: Physician Assistant

## 2024-08-19 DIAGNOSIS — E1169 Type 2 diabetes mellitus with other specified complication: Secondary | ICD-10-CM | POA: Diagnosis not present

## 2024-08-19 DIAGNOSIS — G629 Polyneuropathy, unspecified: Secondary | ICD-10-CM | POA: Diagnosis not present

## 2024-08-19 DIAGNOSIS — I739 Peripheral vascular disease, unspecified: Secondary | ICD-10-CM | POA: Diagnosis not present

## 2024-08-20 ENCOUNTER — Other Ambulatory Visit

## 2024-08-20 ENCOUNTER — Encounter: Admitting: Physician Assistant

## 2024-08-20 DIAGNOSIS — E11621 Type 2 diabetes mellitus with foot ulcer: Secondary | ICD-10-CM | POA: Diagnosis not present

## 2024-08-20 DIAGNOSIS — M86671 Other chronic osteomyelitis, right ankle and foot: Secondary | ICD-10-CM | POA: Diagnosis not present

## 2024-08-20 LAB — GLUCOSE, CAPILLARY
Glucose-Capillary: 164 mg/dL — ABNORMAL HIGH (ref 70–99)
Glucose-Capillary: 184 mg/dL — ABNORMAL HIGH (ref 70–99)

## 2024-08-23 ENCOUNTER — Other Ambulatory Visit

## 2024-08-23 ENCOUNTER — Encounter: Admitting: Physician Assistant

## 2024-08-24 ENCOUNTER — Encounter: Admitting: Physician Assistant

## 2024-08-25 ENCOUNTER — Encounter: Admitting: Physician Assistant

## 2024-08-25 ENCOUNTER — Encounter: Payer: Self-pay | Admitting: Urology

## 2024-08-26 ENCOUNTER — Encounter: Admitting: Physician Assistant

## 2024-08-27 ENCOUNTER — Encounter: Admitting: Physician Assistant

## 2024-08-27 DIAGNOSIS — L97514 Non-pressure chronic ulcer of other part of right foot with necrosis of bone: Secondary | ICD-10-CM | POA: Diagnosis not present

## 2024-08-27 DIAGNOSIS — E11621 Type 2 diabetes mellitus with foot ulcer: Secondary | ICD-10-CM | POA: Diagnosis not present

## 2024-08-30 ENCOUNTER — Encounter: Admitting: Physician Assistant

## 2024-08-31 ENCOUNTER — Encounter: Admitting: Physician Assistant

## 2024-08-31 ENCOUNTER — Other Ambulatory Visit: Payer: Self-pay | Admitting: Family Medicine

## 2024-08-31 DIAGNOSIS — E1169 Type 2 diabetes mellitus with other specified complication: Secondary | ICD-10-CM

## 2024-08-31 DIAGNOSIS — E1140 Type 2 diabetes mellitus with diabetic neuropathy, unspecified: Secondary | ICD-10-CM

## 2024-09-01 ENCOUNTER — Ambulatory Visit (INDEPENDENT_AMBULATORY_CARE_PROVIDER_SITE_OTHER): Admitting: Family Medicine

## 2024-09-01 ENCOUNTER — Encounter: Admitting: Physician Assistant

## 2024-09-01 ENCOUNTER — Encounter: Payer: Self-pay | Admitting: Family Medicine

## 2024-09-01 VITALS — BP 124/70 | HR 68 | Ht 72.0 in | Wt 199.5 lb

## 2024-09-01 DIAGNOSIS — F5101 Primary insomnia: Secondary | ICD-10-CM | POA: Diagnosis not present

## 2024-09-01 DIAGNOSIS — Z7985 Long-term (current) use of injectable non-insulin antidiabetic drugs: Secondary | ICD-10-CM | POA: Diagnosis not present

## 2024-09-01 DIAGNOSIS — Z8249 Family history of ischemic heart disease and other diseases of the circulatory system: Secondary | ICD-10-CM | POA: Diagnosis not present

## 2024-09-01 DIAGNOSIS — N138 Other obstructive and reflux uropathy: Secondary | ICD-10-CM | POA: Diagnosis not present

## 2024-09-01 DIAGNOSIS — G603 Idiopathic progressive neuropathy: Secondary | ICD-10-CM

## 2024-09-01 DIAGNOSIS — M961 Postlaminectomy syndrome, not elsewhere classified: Secondary | ICD-10-CM | POA: Diagnosis not present

## 2024-09-01 DIAGNOSIS — K219 Gastro-esophageal reflux disease without esophagitis: Secondary | ICD-10-CM | POA: Diagnosis not present

## 2024-09-01 DIAGNOSIS — Z0000 Encounter for general adult medical examination without abnormal findings: Secondary | ICD-10-CM

## 2024-09-01 DIAGNOSIS — I10 Essential (primary) hypertension: Secondary | ICD-10-CM | POA: Diagnosis not present

## 2024-09-01 DIAGNOSIS — E785 Hyperlipidemia, unspecified: Secondary | ICD-10-CM

## 2024-09-01 DIAGNOSIS — G894 Chronic pain syndrome: Secondary | ICD-10-CM | POA: Diagnosis not present

## 2024-09-01 DIAGNOSIS — E1140 Type 2 diabetes mellitus with diabetic neuropathy, unspecified: Secondary | ICD-10-CM | POA: Diagnosis not present

## 2024-09-01 DIAGNOSIS — M5412 Radiculopathy, cervical region: Secondary | ICD-10-CM | POA: Diagnosis not present

## 2024-09-01 DIAGNOSIS — F331 Major depressive disorder, recurrent, moderate: Secondary | ICD-10-CM

## 2024-09-01 DIAGNOSIS — E1169 Type 2 diabetes mellitus with other specified complication: Secondary | ICD-10-CM

## 2024-09-01 DIAGNOSIS — I6523 Occlusion and stenosis of bilateral carotid arteries: Secondary | ICD-10-CM

## 2024-09-01 DIAGNOSIS — N401 Enlarged prostate with lower urinary tract symptoms: Secondary | ICD-10-CM

## 2024-09-01 MED ORDER — PANTOPRAZOLE SODIUM 40 MG PO TBEC: 40.0000 mg | DELAYED_RELEASE_TABLET | Freq: Two times a day (BID) | ORAL | 3 refills | Status: AC

## 2024-09-01 MED ORDER — ATORVASTATIN CALCIUM 10 MG PO TABS: 10.0000 mg | ORAL_TABLET | Freq: Every day | ORAL | 3 refills | Status: AC

## 2024-09-01 NOTE — Patient Instructions (Addendum)
 Thank you for coming to the office today.  Keep up with Neurologist at [facility] 09/08/24  Ask about nerve conduction study and balance therapy (Physical therapy) I can do the referral for PT if you need or they can set you up if they have a special program  Refills set for up to a year  Less likely to be Vascular circulation issue with the legs given the normal CTA Scan you did at [facility] that showed patent arterial flow.  We can assist with other referrals if needed.  Please schedule a Follow-up Appointment to: Return in about 3 months (around 12/02/2024) for 3 month DM A1c (GLP1 orders? Neuro updates).  If you have any other questions or concerns, please feel free to call the office or send a message through MyChart. You may also schedule an earlier appointment if necessary.  Additionally, you may be receiving a survey about your experience at our office within a few days to 1 week by e-mail or mail. We value your feedback.  Marsa Officer, DO [facility], [place]

## 2024-09-01 NOTE — Telephone Encounter (Signed)
 Requested Prescriptions  Pending Prescriptions Disp Refills   metFORMIN  (GLUCOPHAGE ) 1000 MG tablet [Pharmacy Med Name: METFORMIN  HCL 1,000 MG TABLET] 180 tablet 3    Sig: TAKE 1 TABLET (1,000 MG TOTAL) BY MOUTH TWICE A DAY WITH FOOD     Endocrinology:  Diabetes - Biguanides Failed - 09/01/2024 12:26 PM      Failed - Cr in normal range and within 360 days    Creat  Date Value Ref Range Status  08/22/2023 0.74 0.70 - 1.35 mg/dL Final   Creatinine, Urine  Date Value Ref Range Status  08/29/2023 132 20 - 320 mg/dL Final         Failed - HBA1C is between 0 and 7.9 and within 180 days    Hgb A1c MFr Bld  Date Value Ref Range Status  08/22/2023 5.6 <5.7 % of total Hgb Final    Comment:    For the purpose of screening for the presence of diabetes: . <5.7%       Consistent with the absence of diabetes 5.7-6.4%    Consistent with increased risk for diabetes             (prediabetes) > or =6.5%  Consistent with diabetes . This assay result is consistent with a decreased risk of diabetes. . Currently, no consensus exists regarding use of hemoglobin A1c for diagnosis of diabetes in children. . According to American Diabetes Association (ADA) guidelines, hemoglobin A1c <7.0% represents optimal control in non-pregnant diabetic patients. Different metrics may apply to specific patient populations.  Standards of Medical Care in Diabetes(ADA). .          Failed - eGFR in normal range and within 360 days    GFR, Est African American  Date Value Ref Range Status  11/10/2020 111 > OR = 60 mL/min/1.64m2 Final   GFR, Est Non African American  Date Value Ref Range Status  11/10/2020 96 > OR = 60 mL/min/1.30m2 Final   eGFR  Date Value Ref Range Status  08/22/2023 102 > OR = 60 mL/min/1.52m2 Final         Failed - B12 Level in normal range and within 720 days    Vitamin B-12  Date Value Ref Range Status  08/07/2022 402 200 - 1,100 pg/mL Final         Failed - CBC within normal  limits and completed in the last 12 months    WBC  Date Value Ref Range Status  08/22/2023 7.5 3.8 - 10.8 Thousand/uL Final   RBC  Date Value Ref Range Status  08/22/2023 5.40 4.20 - 5.80 Million/uL Final   Hemoglobin  Date Value Ref Range Status  08/22/2023 15.1 13.2 - 17.1 g/dL Final  94/95/7976 83.2 13.0 - 17.7 g/dL Final   HCT  Date Value Ref Range Status  08/22/2023 47.7 38.5 - 50.0 % Final   Hematocrit  Date Value Ref Range Status  03/03/2023 47.3 37.5 - 51.0 % Final   MCHC  Date Value Ref Range Status  08/22/2023 31.7 (L) 32.0 - 36.0 g/dL Final    Comment:    For adults, a slight decrease in the calculated MCHC value (in the range of 30 to 32 g/dL) is most likely not clinically significant; however, it should be interpreted with caution in correlation with other red cell parameters and the patient's clinical condition.    [facility]  Date Value Ref Range Status  08/22/2023 28.0 27.0 - 33.0 pg Final   MCV  Date Value Ref  Range Status  08/22/2023 88.3 80.0 - 100.0 fL Final  06/26/2015 87 79 - 97 fL Final  08/20/2013 89 80 - 100 fL Final   No results found for: PLTCOUNTKUC, LABPLAT, POCPLA RDW  Date Value Ref Range Status  08/22/2023 13.6 11.0 - 15.0 % Final  06/26/2015 14.4 12.3 - 15.4 % Final  08/20/2013 14.2 11.5 - 14.5 % Final         Passed - Valid encounter within last 6 months    Recent Outpatient Visits   None     Future Appointments             In 2 days Stoioff, Glendia BROCKS, MD [facility] [place]             atorvastatin  (LIPITOR) 10 MG tablet [Pharmacy Med Name: ATORVASTATIN  10 MG TABLET] 90 tablet 1    Sig: TAKE 1 TABLET BY MOUTH EVERY DAY     Cardiovascular:  Antilipid - Statins Failed - 09/01/2024 12:26 PM      Failed - Lipid Panel in normal range within the last 12 months    Cholesterol, Total  Date Value Ref Range Status  06/26/2015 104 100 - 199 mg/dL Final   Cholesterol  Date Value Ref Range Status   08/22/2023 108 <200 mg/dL Final   LDL Cholesterol (Calc)  Date Value Ref Range Status  08/22/2023 46 mg/dL (calc) Final    Comment:    Reference range: <100 . Desirable range <100 mg/dL for primary prevention;   <70 mg/dL for patients with CHD or diabetic patients  with > or = 2 CHD risk factors. SABRA LDL-C is now calculated using the Martin-Hopkins  calculation, which is a validated novel method providing  better accuracy than the Friedewald equation in the  estimation of LDL-C.  Gladis APPLETHWAITE et al. SANDREA. 7986;689(80): 2061-2068  (http://education.[employer].com/faq/FAQ164)    HDL  Date Value Ref Range Status  08/22/2023 41 > OR = 40 mg/dL Final  91/77/7983 30 (L) >39 mg/dL Final    Comment:    According to ATP-III Guidelines, HDL-C >59 mg/dL is considered a negative risk factor for CHD.    Triglycerides  Date Value Ref Range Status  08/22/2023 125 <150 mg/dL Final         Passed - Patient is not pregnant      Passed - Valid encounter within last 12 months    Recent Outpatient Visits   None     Future Appointments             In 2 days Stoioff, Glendia BROCKS, MD [facility]

## 2024-09-01 NOTE — Progress Notes (Signed)
 Subjective:    Patient ID: Dale [name]., male    DOB: 12-03-1960, 63 y.o.   MRN: [id number]  Dale [name]. is a 63 y.o. male presenting on 09/01/2024 for Annual Exam   HPI  Discussed the use of AI scribe software for clinical note transcription with the patient, who gave verbal consent to proceed.  History of Present Illness   Dale [name]. is a 63 year old male who presents for an annual physical exam.  Peripheral neuropathy and gait disturbance - Significant neuropathy in both legs, described as sensation of 'concrete attached' from knees down - Paresthesia with sensation of wearing socks when none are present - Limited ambulation to approximately 25 feet before requiring rest - History of nerve issues following intrathecal pain pump procedures, associated with onset of neuropathy - Two major falls recently, one resulting in head injury - Balance impairment attributed to neuropathy, with forward-tilted gait - Wife has observed balance issues  Right lower extremity skin ulcer and recent bacteremia - History of bacteremia treated with IV antibiotics approximately 2-3 months ago - Source of infection unclear, but concurrent treatment for right great toe skin ulcer - Ulcer has significantly improved but persists as a small crack with drainage - No fever or systemic symptoms during episode of bacteremia - No longer taking clindamycin or daptomycin  Hypogonadism Followed by Urology Dr Twylla, apt on 10/31, with labs and re order Testosterone , he has been out of injections for 3 weeks   CHRONIC HTN: BP lower on avg Followed by [facility] Dr Hester previously. Off medication - OFF Amlodipine , hydrochlorothiazide  Lisinopril  Reports good compliance, took meds today. Tolerating well, w/o complaints. Denies CP, dyspnea, HA, edema, dizziness / lightheadedness   Type 2 DM with peripheral neuropathy Hyperlipidemia T2DM Controlled Due for A1c On  medications - Ozempic  1mg  weekly, Metformin  1000mg  BID CBGs improved 90-120s Lab reviewed On Statin Admits some abdominal pain at times. Constipation. Increased Gas production No more stomach acid Wt down 180 lbs, but up to 200 lbs.    Tremors Propranolol  40mg  TID - improved tremors   Recurrent Major Depression Followed by Psychiatry CBC Currently on medication management Alprazolam  He is working with them on refills / authorization    Joint Pain / Arthritis R Knee / Foot Followed by [facility]   GERD Indigestion Bloating likely secondary to GLP1    Carotid ASCVD Dentist picked up some carotid artery plaque on X-ray imaging routine Will pursue Carotid Artery Doppler US    BPH Difficulty with urinary stream and constellation of BPH symptoms Followed by Dr Twylla, upcoming apt. Urinary urgency / hesitancy On Flomax  0.8mg  max, consider options finasteride vs OAB possibility or procedural   Health Maintenance:   Colonoscopy 01/22/24 Dr Onita Glenn [facility] - 1 polyp, not significant, repeat 10 years.      09/01/2024    7:21 PM 07/09/2024    4:09 PM 07/04/2023    9:30 AM  Depression screen PHQ 2/9  Decreased Interest 2 2 0  Down, Depressed, Hopeless 2 2 0  PHQ - 2 Score 4 4 0  Altered sleeping 0 0 0  Tired, decreased energy 2 2 0  Change in appetite 0 0 0  Feeling bad or failure about yourself  0 0 0  Trouble concentrating 0 0 0  Moving slowly or fidgety/restless 0 0 0  Suicidal thoughts 0 0 0  PHQ-9 Score 6 6 0  Difficult doing work/chores Not difficult at all  Not difficult at all Not difficult at all       02/27/2023   10:02 AM 09/18/2022   11:09 AM 08/14/2022   11:00 AM 03/28/2022   11:00 AM  GAD 7 : Generalized Anxiety Score  Nervous, Anxious, on Edge 1 2 2 1   Control/stop worrying 2 2 1  0  Worry too much - different things 0 2 1 0  Trouble relaxing 0 1 0 0  Restless 0 3 1 0  Easily annoyed or irritable 2 3 2 2   Afraid - awful might happen 0 1 0 0   Total GAD 7 Score 5 14 7 3   Anxiety Difficulty  Somewhat difficult Very difficult      Past Medical History:  Diagnosis Date   ADHD (attention deficit hyperactivity disorder)    Anxiety    Coronary artery disease    Coronary artery disease    Coronary atherosclerosis    Depression    GERD (gastroesophageal reflux disease)    Headache    migrane   Heart disease    Hyperlipidemia    Hypertension    Hypertriglyceridemia    Hypogonadism in male    MVP (mitral valve prolapse)    Narcolepsy and cataplexy    Non-alcoholic fatty liver disease    Past Surgical History:  Procedure Laterality Date   COLONOSCOPY N/A 01/22/2024   Procedure: COLONOSCOPY;  Surgeon: Onita Elspeth Sharper, DO;  Location: [facility] ENDOSCOPY;  Service: Gastroenterology;  Laterality: N/A;  DM   COLONOSCOPY WITH PROPOFOL  N/A 11/18/2018   Procedure: COLONOSCOPY WITH PROPOFOL ;  Surgeon: Viktoria Lamar DASEN, MD;  Location: [facility] ENDOSCOPY;  Service: Endoscopy;  Laterality: N/A;   ESOPHAGOGASTRODUODENOSCOPY (EGD) WITH PROPOFOL  N/A 11/18/2018   Procedure: ESOPHAGOGASTRODUODENOSCOPY (EGD) WITH PROPOFOL ;  Surgeon: Viktoria Lamar DASEN, MD;  Location: [facility] ENDOSCOPY;  Service: Endoscopy;  Laterality: N/A;   pain pump implant and removal Bilateral    Dec. 21 2019   POLYPECTOMY  01/22/2024   Procedure: POLYPECTOMY;  Surgeon: Onita Elspeth Sharper, DO;  Location: [facility] ENDOSCOPY;  Service: Gastroenterology;;   SPINE SURGERY     Multiple surgeries, initial 2001, thoracic disc repair and recurrent revisions.   THROAT SURGERY     Social History   Socioeconomic History   Marital status: Married    Spouse name: Not on file   Number of children: Not on file   Years of education: 1 yr of college   Highest education level: Some college, no degree  Occupational History   Occupation: Disabled  Tobacco Use   Smoking status: Former    Current packs/day: 0.00    Types: Cigarettes    Start date: 08/29/1979    Quit date: 11/04/2010     Years since quitting: 13.8   Smokeless tobacco: Former    Quit date: 11/19/2010   Tobacco comments:    1 to 1.5 packs per day  Vaping Use   Vaping status: Never Used  Substance and Sexual Activity   Alcohol use: No    Alcohol/week: 0.0 standard drinks of alcohol   Drug use: No   Sexual activity: Not Currently  Other Topics Concern   Not on file  Social History Narrative   Not on file   Social Drivers of Health   Financial Resource Strain: Low Risk  (07/09/2024)   Overall Financial Resource Strain (CARDIA)    Difficulty of Paying Living Expenses: Not hard at all  Food Insecurity: No Food Insecurity (07/09/2024)   Hunger Vital Sign    Worried About  Running Out of Food in the Last Year: Never true    Ran Out of Food in the Last Year: Never true  Transportation Needs: No Transportation Needs (07/09/2024)   PRAPARE - Administrator, Civil Service (Medical): No    Lack of Transportation (Non-Medical): No  Physical Activity: Inactive (07/09/2024)   Exercise Vital Sign    Days of Exercise per Week: 0 days    Minutes of Exercise per Session: 0 min  Stress: Stress Concern Present (07/09/2024)   [employer] of Occupational Health - Occupational Stress Questionnaire    Feeling of Stress: Rather much  Social Connections: Moderately Isolated (07/09/2024)   Social Connection and Isolation Panel    Frequency of Communication with Friends and Family: Once a week    Frequency of Social Gatherings with Friends and Family: More than three times a week    Attends Religious Services: Never    Database Administrator or Organizations: No    Attends Banker Meetings: Never    Marital Status: Married  Catering Manager Violence: Not At Risk (07/09/2024)   Humiliation, Afraid, Rape, and Kick questionnaire    Fear of Current or Ex-Partner: No    Emotionally Abused: No    Physically Abused: No    Sexually Abused: No   Family History  Problem Relation Age of Onset   Depression  Mother    Anxiety disorder Mother    Diabetes Mother    Heart failure Mother    Heart attack Father    Diabetes Sister    Obesity Sister    Drug abuse Brother    Alcohol abuse Brother    Depression Sister    Anxiety disorder Sister    Drug abuse Sister    Anxiety disorder Sister    Seizures Sister    Diabetes Sister    Depression Sister    Current Outpatient Medications on File Prior to Visit  Medication Sig   ACCU-CHEK GUIDE TEST test strip 1 EACH BY IN VITRO ROUTE IN THE MORNING, AT NOON, AND AT BEDTIME.   acetaminophen  (TYLENOL ) 500 MG tablet Take 500 mg by mouth 2 (two) times daily as needed.   ALPRAZolam  (XANAX ) 0.5 MG tablet Take 0.5 mg by mouth 2 (two) times daily as needed.   amphetamine-dextroamphetamine (ADDERALL) 10 MG tablet    aspirin  EC 81 MG tablet Take 81 mg by mouth daily.   b complex vitamins capsule Take by mouth.   Blood Glucose Monitoring Suppl (TRUE METRIX AIR GLUCOSE METER) w/Device KIT Check blood sugar 2 times daily   Blood Glucose Monitoring Suppl DEVI 1 each by Does not apply route in the morning, at noon, and at bedtime. May substitute to any manufacturer covered by patient's insurance.   buPROPion  (WELLBUTRIN  XL) 150 MG 24 hr tablet Take 150 mg by mouth daily.   Cholecalciferol 25 MCG (1000 UT) TBDP Take 2 tablets by mouth daily.   CINNAMON PO Take by mouth.   desoximetasone  (TOPICORT ) 0.25 % cream Apply 1 Application topically 2 (two) times daily as needed.   diclofenac sodium (VOLTAREN) 1 % GEL Apply topically.   dicyclomine  (BENTYL ) 20 MG tablet TAKE 1 TABLET (20 MG TOTAL) BY MOUTH 3 TIMES DAILY BEFORE MEALS. AS NEEDED FOR ABDOMINAL CRAMPING   DULoxetine  (CYMBALTA ) 60 MG capsule Take 60 mg by mouth 2 (two) times daily.   gabapentin  (NEURONTIN ) 300 MG capsule Take 3 capsules by mouth 3 (three) times daily.   hydrOXYzine  (ATARAX ) 25 MG  tablet Take 25 mg by mouth 2 (two) times daily as needed.   ibuprofen (ADVIL) 200 MG tablet Take 600 mg by mouth 2  (two) times daily as needed.   lidocaine  (LIDODERM ) 5 % SMARTSIG:Topical   metFORMIN  (GLUCOPHAGE ) 1000 MG tablet TAKE 1 TABLET (1,000 MG TOTAL) BY MOUTH TWICE A DAY WITH FOOD   Multiple Vitamin (MULTIVITAMIN) tablet Take 1 tablet by mouth daily.   Omega-3 Fatty Acids (FISH OIL ) 1000 MG CAPS Take by mouth.   OZEMPIC , 1 MG/DOSE, 4 MG/3ML SOPN INJECT 1MG  INTO THE SKIN ONCE A WEEK   propranolol  (INDERAL ) 40 MG tablet TAKE 1 TABLET BY MOUTH 3 TIMES DAILY.   sildenafil  (VIAGRA ) 100 MG tablet Take 1 tablet (100 mg total) by mouth daily as needed for erectile dysfunction.   tamsulosin  (FLOMAX ) 0.4 MG CAPS capsule TAKE 2 CAPSULES BY MOUTH EVERY DAY   testosterone  cypionate (DEPOTESTOSTERONE CYPIONATE) 200 MG/ML injection INJECT 1 ML (200 MG TOTAL) INTO THE MUSCLE EVERY 14 DAYS   tiZANidine (ZANAFLEX) 2 MG tablet Take 2 mg by mouth at bedtime as needed.   tiZANidine (ZANAFLEX) 4 MG tablet 1 tablet twice daily, May take an additional 1/2 tablet at bedtime as needed   traMADol (ULTRAM) 50 MG tablet Take 50 mg by mouth every 6 (six) hours as needed.   traZODone  (DESYREL ) 50 MG tablet Take 0.5 tablets (25 mg total) by mouth at bedtime as needed.   TRUE METRIX BLOOD GLUCOSE TEST test strip Check blood sugar 2 times a day   Glucosamine-Chondroitin 250-200 MG TABS Take by mouth. (Patient not taking: Reported on 09/01/2024)   No current facility-administered medications on file prior to visit.    Review of Systems  Constitutional:  Negative for activity change, appetite change, chills, diaphoresis, fatigue and fever.  HENT:  Negative for congestion and hearing loss.   Eyes:  Negative for visual disturbance.  Respiratory:  Negative for cough, chest tightness, shortness of breath and wheezing.   Cardiovascular:  Negative for chest pain, palpitations and leg swelling.  Gastrointestinal:  Negative for abdominal pain, constipation, diarrhea, nausea and vomiting.  Genitourinary:  Negative for dysuria, frequency  and hematuria.  Musculoskeletal:  Negative for arthralgias and neck pain.  Skin:  Negative for rash.  Neurological:  Negative for dizziness, weakness, light-headedness, numbness and headaches.  Hematological:  Negative for adenopathy.  Psychiatric/Behavioral:  Negative for behavioral problems, dysphoric mood and sleep disturbance.    Per HPI unless specifically indicated above     Objective:    BP 124/70 (BP Location: Left Arm, Patient Position: Sitting, Cuff Size: Normal)   Pulse 68   Ht 6' (1.829 m)   Wt 199 lb 8 oz (90.5 kg)   BMI 27.06 kg/m   Wt Readings from Last 3 Encounters:  09/01/24 199 lb 8 oz (90.5 kg)  01/22/24 190 lb (86.2 kg)  09/03/23 192 lb (87.1 kg)    Physical Exam Vitals and nursing note reviewed.  Constitutional:      General: He is not in acute distress.    Appearance: He is well-developed. He is not diaphoretic.     Comments: Well-appearing, comfortable, cooperative  HENT:     Head: Normocephalic and atraumatic.  Eyes:     General:        Right eye: No discharge.        Left eye: No discharge.     Conjunctiva/sclera: Conjunctivae normal.     Pupils: Pupils are equal, round, and reactive to light.  Neck:  Thyroid : No thyromegaly.  Cardiovascular:     Rate and Rhythm: Normal rate and regular rhythm.     Pulses: Normal pulses.     Heart sounds: Normal heart sounds. No murmur heard. Pulmonary:     Effort: Pulmonary effort is normal. No respiratory distress.     Breath sounds: Normal breath sounds. No wheezing or rales.  Abdominal:     General: Bowel sounds are normal. There is no distension.     Palpations: Abdomen is soft. There is no mass.     Tenderness: There is no abdominal tenderness.  Musculoskeletal:        General: No tenderness. Normal range of motion.     Cervical back: Normal range of motion and neck supple.     Right lower leg: No edema.     Left lower leg: No edema.     Comments: Upper / Lower Extremities: - Normal muscle  tone, strength bilateral upper extremities 5/5, lower extremities 5/5  Lymphadenopathy:     Cervical: No cervical adenopathy.  Skin:    General: Skin is warm and dry.     Findings: Lesion (see photo R toe ulceration healing) present. No erythema or rash.  Neurological:     Mental Status: He is alert and oriented to person, place, and time.     Comments: Distal sensation intact to light touch all extremities  Psychiatric:        Mood and Affect: Mood normal.        Behavior: Behavior normal.        Thought Content: Thought content normal.     Comments: Well groomed, good eye contact, normal speech and thoughts     R Foot    Diabetic Foot Exam - Simple   Simple Foot Form Diabetic Foot exam was performed with the following findings: Yes 09/01/2024  1:49 PM  Visual Inspection See comments: Yes Sensation Testing See comments: Yes Pulse Check Posterior Tibialis and Dorsalis pulse intact bilaterally: Yes Comments Severely reduced sensation to monofilament testing bilateral feet plantar and toes, intact only on dorsal surface, also reduced in ankles. Right great toe with healing ulceration. Mild callus heels.      CTA Lower Extremity Right W Wo Contrast  Anatomical Region Laterality Modality  Leg -- Computed Tomography  Thigh -- --  Foot -- --  Vascular -- --   Impression   Patent arterial vasculature of the right lower extremity with moderate calcified and noncalcified atherosclerotic disease along the common femoral artery.  Redemonstrated right first MTP arthrodesis without adverse hardware features. No new visible osseous erosions.  Great toe plantar soft tissue ulcer gas locules in the ulcer bed. Correlate clinically for signs of infection. Narrative  EXAM: CTA Lower Extremity DATE: 05/25/2024 1:24 PM ACCESSION: [id number] UN DICTATED: 05/25/2024 2:24 PM INTERPRETATION LOCATION: MAIN CAMPUS  CLINICAL INDICATION: 63 years old Male with eval peripheral bloodflow     COMPARISON: CT lower extremity 05/20/2024  TECHNIQUE: A spiral CTA scan was obtained with IV contrast of the right lower extremity from the hip to the foot. Images were reconstructed in the axial plane. Multiplanar reformatted and MIP images were provided for further evaluation of the vessels. For selected cases, 3D volume rendered images are also provided.  VASCULAR FINDINGS:  RIGHT INFLOW: Patent right common external and internal iliac arteries. CFA: Patent. Moderate calcified and noncalcified atherosclerotic disease. PROFUNDA: Patent. SFA/POPLITEAL: Patent. Mild atherosclerotic disease of the popliteal artery.. PT: Patent. PERONEAL: Patent. AT: Patent.  VEINS:Normal caliber. No evidence  of thrombosis.  NON-VASCULAR FINDINGS:  BLADDER: Unremarkable. PELVIC/REPRODUCTIVE ORGANS: Unremarkable.  GI TRACT: No abnormalities of the partially imaged bowel.  PERITONEUM/RETROPERITONEUM AND MESENTERY: No free air or fluid. LYMPH NODES: Prominent right inguinal and right external iliac chain nodes measuring up to 0.9 cm. Possibly reactive.  BONES AND SOFT TISSUES: Redemonstrated screw-plate arthrodesis of the first MTP joint. No adverse hardware features. There is persistent narrowing and irregularity at the MTP joint without complete osseous union. Redemonstrated plantar soft tissue ulcer of the great toe, question trace locules of gas noted within the ulcer bed (5:1096). Small right knee effusion. No acute fracture of the right lower extremity and partially imaged bones of the left foot. Bilateral fat-containing inguinal hernias. Procedure Note  Du Omega Barton CROME, MD - 05/25/2024 Formatting of this note might be different from the original. EXAM: CTA Lower Extremity DATE: 05/25/2024 1:24 PM ACCESSION: [id number] UN DICTATED: 05/25/2024 2:24 PM INTERPRETATION LOCATION: MAIN CAMPUS  CLINICAL INDICATION: 63 years old Male with eval peripheral bloodflow    COMPARISON: CT lower  extremity 05/20/2024  TECHNIQUE: A spiral CTA scan was obtained with IV contrast of the right lower extremity from the hip to the foot. Images were reconstructed in the axial plane. Multiplanar reformatted and MIP images were provided for further evaluation of the vessels. For selected cases, 3D volume rendered images are also provided.  VASCULAR FINDINGS:  RIGHT INFLOW: Patent right common external and internal iliac arteries. CFA: Patent. Moderate calcified and noncalcified atherosclerotic disease. PROFUNDA: Patent. SFA/POPLITEAL: Patent. Mild atherosclerotic disease of the popliteal artery.. PT: Patent. PERONEAL: Patent. AT: Patent.  VEINS:Normal caliber. No evidence of thrombosis.  NON-VASCULAR FINDINGS:  BLADDER: Unremarkable. PELVIC/REPRODUCTIVE ORGANS: Unremarkable.  GI TRACT: No abnormalities of the partially imaged bowel.  PERITONEUM/RETROPERITONEUM AND MESENTERY: No free air or fluid. LYMPH NODES: Prominent right inguinal and right external iliac chain nodes measuring up to 0.9 cm. Possibly reactive.  BONES AND SOFT TISSUES: Redemonstrated screw-plate arthrodesis of the first MTP joint. No adverse hardware features. There is persistent narrowing and irregularity at the MTP joint without complete osseous union. Redemonstrated plantar soft tissue ulcer of the great toe, question trace locules of gas noted within the ulcer bed (5:1096). Small right knee effusion. No acute fracture of the right lower extremity and partially imaged bones of the left foot. Bilateral fat-containing inguinal hernias.  IMPRESSION:  Patent arterial vasculature of the right lower extremity with moderate calcified and noncalcified atherosclerotic disease along the common femoral artery.  Redemonstrated right first MTP arthrodesis without adverse hardware features. No new visible osseous erosions.  Great toe plantar soft tissue ulcer gas locules in the ulcer bed. Correlate clinically for signs of  infection. Exam End: 05/25/24 13:24   Specimen Collected: 05/25/24 14:24 Last Resulted: 05/25/24 15:23  Received From: [facility]  Result Received: 06/09/24 08:23     Results for orders placed or performed in visit on 08/20/24  Glucose, capillary   Collection Time: 08/20/24  8:09 AM  Result Value Ref Range   Glucose-Capillary 164 (H) 70 - 99 mg/dL  Glucose, capillary   Collection Time: 08/20/24 10:21 AM  Result Value Ref Range   Glucose-Capillary 184 (H) 70 - 99 mg/dL      Assessment & Plan:   Problem List Items Addressed This Visit     Essential (primary) hypertension   Relevant Medications   atorvastatin  (LIPITOR) 10 MG tablet   Other Relevant Orders   CBC with Differential/Platelet   Comprehensive metabolic panel with GFR   Gastroesophageal  reflux disease without esophagitis   Relevant Medications   pantoprazole  (PROTONIX ) 40 MG tablet   Hyperlipidemia associated with type 2 diabetes mellitus (HCC)   Relevant Medications   atorvastatin  (LIPITOR) 10 MG tablet   Other Relevant Orders   Lipid panel   TSH   Comprehensive metabolic panel with GFR   CT CARDIAC SCORING (SELF PAY ONLY)   Moderate episode of recurrent major depressive disorder (HCC)   Peripheral neuropathy   Type 2 diabetes, controlled, with neuropathy (HCC)   Relevant Medications   atorvastatin  (LIPITOR) 10 MG tablet   Other Relevant Orders   Hemoglobin A1c   Microalbumin / creatinine urine ratio   CT CARDIAC SCORING (SELF PAY ONLY)   Other Visit Diagnoses       Annual physical exam    -  Primary   Relevant Orders   Lipid panel   Hemoglobin A1c   CBC with Differential/Platelet   PSA   Microalbumin / creatinine urine ratio   TSH   Comprehensive metabolic panel with GFR     Primary insomnia         BPH with obstruction/lower urinary tract symptoms       Relevant Orders   PSA     Family history of heart disease       Relevant Orders   CT CARDIAC SCORING (SELF PAY ONLY)     Carotid  atherosclerosis, bilateral       Relevant Medications   atorvastatin  (LIPITOR) 10 MG tablet   Other Relevant Orders   US  Carotid Bilateral     Long-term current use of injectable noninsulin antidiabetic medication            Updated Health Maintenance information Reviewed recent lab results with patient Encouraged improvement to lifestyle with diet and exercise Goal of weight loss   Adult Wellness Visit Annual wellness visit conducted. Reviewed vaccinations, colonoscopy, and general health screenings. Discussed potential need for heart scan due to family history of heart disease. - Order blood work and urine test. - Order heart scan. CT  Type 2 diabetes mellitus Diabetes well-controlled. Discussed potential changes in medication coverage starting January. - Monitor blood glucose levels. On Ozempic  - Follow up on medication coverage changes in January.  Peripheral neuropathy of bilateral lower extremities Chronic neuropathy with reduced sensation. Likely neuropathic in origin, possibly related to past intrathecal pain pump procedures. Discussed potential for nerve conduction study and balance therapy with upcoming neurologist visit.  Less likely vascular claudication given no obvious skin changes from reduced arterial circulation and recent CTA Lower Extremity shows patent arteries  - Follow up with neuropathy neurologist on November 7. - Discuss nerve conduction study and balance therapy with neurologist.  Chronic right great toe ulcer, improved Followed by [facility] Center Chronic ulcer showing significant improvement. Minimal drainage observed. - Continue current wound care management. - Monitor for signs of infection or deterioration.  History Bacteremia History of bacteremia treated with IV antibiotics. - Monitor for any signs of recurrent infection.  Gastroesophageal reflux disease GERD managed with pantoprazole . Prescription refills needed by the start of the year. -  Refill pantoprazole  prescription.  Essential hypertension Blood pressure well-controlled. Recent fall possibly related to balance issues rather than blood pressure. - Continue current antihypertensive regimen. - Monitor blood pressure regularly.  Atherosclerosis calcification plaque carotids on dental X-ray - Order carotid ultrasound.  Falls Recent falls possibly related to balance issues and neuropathy. - Discuss balance therapy with neurologist. - Consider physical therapy for balance if recommended  by neurologist.         Orders Placed This Encounter  Procedures   CT CARDIAC SCORING (SELF PAY ONLY)    Standing Status:   Future    Expiration Date:   09/01/2025    Preferred imaging location?:   [facility]   US  Carotid Bilateral    Standing Status:   Future    Expiration Date:   09/01/2025    Reason for Exam (SYMPTOM  OR DIAGNOSIS REQUIRED):   carotid atherosclerosis plaque on x-ray    Preferred imaging location?:   [facility] [place]   Lipid panel    Has the patient fasted?:   No   Hemoglobin A1c   CBC with Differential/Platelet   PSA   Microalbumin / creatinine urine ratio   TSH   Comprehensive metabolic panel with GFR    Has the patient fasted?:   No    Meds ordered this encounter  Medications   pantoprazole  (PROTONIX ) 40 MG tablet    Sig: Take 1 tablet (40 mg total) by mouth 2 (two) times daily before a meal.    Dispense:  180 tablet    Refill:  3   atorvastatin  (LIPITOR) 10 MG tablet    Sig: Take 1 tablet (10 mg total) by mouth daily.    Dispense:  90 tablet    Refill:  3    Add refills future     Follow up plan: Return in about 3 months (around 12/02/2024) for 3 month DM A1c (GLP1 orders? Neuro updates).  Marsa Officer, DO [facility] 09/01/2024, 1:18 PM

## 2024-09-02 ENCOUNTER — Encounter: Admitting: Physician Assistant

## 2024-09-02 ENCOUNTER — Other Ambulatory Visit: Payer: Self-pay

## 2024-09-02 ENCOUNTER — Ambulatory Visit: Payer: Self-pay | Admitting: Family Medicine

## 2024-09-02 ENCOUNTER — Other Ambulatory Visit

## 2024-09-02 DIAGNOSIS — N401 Enlarged prostate with lower urinary tract symptoms: Secondary | ICD-10-CM

## 2024-09-02 DIAGNOSIS — Z125 Encounter for screening for malignant neoplasm of prostate: Secondary | ICD-10-CM

## 2024-09-02 DIAGNOSIS — E11621 Type 2 diabetes mellitus with foot ulcer: Secondary | ICD-10-CM | POA: Diagnosis not present

## 2024-09-02 LAB — LIPID PANEL
Cholesterol: 149 mg/dL (ref <200)
HDL: 53 mg/dL (ref >=40)
LDL Cholesterol (Calc): 74 mg/dL
Non-HDL Cholesterol (Calc): 96 mg/dL (ref <130)
Total CHOL/HDL Ratio: 2.8 (calc) (ref <5.0)
Triglycerides: 138 mg/dL (ref <150)

## 2024-09-02 LAB — CBC WITH DIFFERENTIAL/PLATELET
Absolute Lymphocytes: 2011 {cells}/uL (ref 850–3900)
Absolute Monocytes: 972 {cells}/uL — ABNORMAL HIGH (ref 200–950)
Basophils Absolute: 45 {cells}/uL (ref 0–200)
Basophils Relative: 0.4 %
Eosinophils Absolute: 170 {cells}/uL (ref 15–500)
Eosinophils Relative: 1.5 %
HCT: 33.7 % — ABNORMAL LOW (ref 38.5–50.0)
Hemoglobin: 9.3 g/dL — ABNORMAL LOW (ref 13.2–17.1)
MCH: 19.2 pg — ABNORMAL LOW (ref 27.0–33.0)
MCHC: 27.6 g/dL — ABNORMAL LOW (ref 32.0–36.0)
MCV: 69.5 fL — ABNORMAL LOW (ref 80.0–100.0)
MPV: 10.4 fL (ref 7.5–12.5)
Monocytes Relative: 8.6 %
Neutro Abs: 8102 {cells}/uL — ABNORMAL HIGH (ref 1500–7800)
Neutrophils Relative %: 71.7 %
Platelets: 396 Thousand/uL (ref 140–400)
RBC: 4.85 Million/uL (ref 4.20–5.80)
RDW: 18.6 % — ABNORMAL HIGH (ref 11.0–15.0)
Total Lymphocyte: 17.8 %
WBC: 11.3 Thousand/uL — ABNORMAL HIGH (ref 3.8–10.8)

## 2024-09-02 LAB — COMPREHENSIVE METABOLIC PANEL WITH GFR
AG Ratio: 1.8 (calc) (ref 1.0–2.5)
ALT: 13 U/L (ref 9–46)
AST: 15 U/L (ref 10–35)
Albumin: 4.4 g/dL (ref 3.6–5.1)
Alkaline phosphatase (APISO): 71 U/L (ref 35–144)
BUN: 15 mg/dL (ref 7–25)
CO2: 31 mmol/L (ref 20–32)
Calcium: 10.3 mg/dL (ref 8.6–10.3)
Chloride: 102 mmol/L (ref 98–110)
Creat: 0.78 mg/dL (ref 0.70–1.35)
Globulin: 2.4 g/dL (ref 1.9–3.7)
Glucose, Bld: 79 mg/dL (ref 65–139)
Potassium: 4.6 mmol/L (ref 3.5–5.3)
Sodium: 140 mmol/L (ref 135–146)
Total Bilirubin: 0.5 mg/dL (ref 0.2–1.2)
Total Protein: 6.8 g/dL (ref 6.1–8.1)
eGFR: 100 mL/min/1.73m2 (ref >=60)

## 2024-09-02 LAB — MICROALBUMIN / CREATININE URINE RATIO
Creatinine, Urine: 82 mg/dL (ref 20–320)
Microalb Creat Ratio: 13 mg/g{creat} (ref <30)
Microalb, Ur: 1.1 mg/dL

## 2024-09-02 LAB — PSA: PSA: 0.32 ng/mL (ref <=4.00)

## 2024-09-02 LAB — HEMOGLOBIN A1C
Hgb A1c MFr Bld: 5.8 % — ABNORMAL HIGH (ref <5.7)
Mean Plasma Glucose: 120 mg/dL
eAG (mmol/L): 6.6 mmol/L

## 2024-09-02 LAB — CBC MORPHOLOGY

## 2024-09-02 LAB — TSH: TSH: 1.23 m[IU]/L (ref 0.40–4.50)

## 2024-09-03 ENCOUNTER — Ambulatory Visit (INDEPENDENT_AMBULATORY_CARE_PROVIDER_SITE_OTHER): Payer: Self-pay | Admitting: Urology

## 2024-09-03 ENCOUNTER — Encounter: Payer: Self-pay | Admitting: Urology

## 2024-09-03 VITALS — BP 99/63 | HR 104 | Ht 71.0 in | Wt 199.0 lb

## 2024-09-03 DIAGNOSIS — E291 Testicular hypofunction: Secondary | ICD-10-CM | POA: Diagnosis not present

## 2024-09-03 DIAGNOSIS — N401 Enlarged prostate with lower urinary tract symptoms: Secondary | ICD-10-CM | POA: Diagnosis not present

## 2024-09-03 DIAGNOSIS — N138 Other obstructive and reflux uropathy: Secondary | ICD-10-CM

## 2024-09-03 DIAGNOSIS — Z125 Encounter for screening for malignant neoplasm of prostate: Secondary | ICD-10-CM | POA: Diagnosis not present

## 2024-09-03 LAB — HEMOGLOBIN AND HEMATOCRIT, BLOOD
Hematocrit: 32 % — ABNORMAL LOW (ref 37.5–51.0)
Hemoglobin: 8.7 g/dL — ABNORMAL LOW (ref 13.0–17.7)

## 2024-09-03 LAB — TESTOSTERONE: Testosterone: 226 ng/dL — ABNORMAL LOW (ref 264–916)

## 2024-09-03 LAB — PSA: Prostate Specific Ag, Serum: 0.3 ng/mL (ref 0.0–4.0)

## 2024-09-03 MED ORDER — TESTOSTERONE CYPIONATE 200 MG/ML IM SOLN
INTRAMUSCULAR | 0 refills | Status: AC
Start: 2024-09-03 — End: ?

## 2024-09-03 NOTE — Progress Notes (Signed)
 09/03/2024  9:37 AM   Dale [name]. Mar 19, 1961 [id number]  Referring provider: Edman Marsa PARAS, DO [address] [place],  [place] [address]  Chief Complaint  Patient presents with   Follow-up   Benign Prostatic Hypertrophy   Urologic history: 1.  Hypogonadism Testosterone  cypionate every 2 weeks   2.  BPH with LUTS Tamsulosin  0.8 mg daily   3.  Erectile dysfunction Sildenafil  prn  HPI: Dale [name]. is a 63 y.o. male presents for annual follow-up.  Ran out of testosterone  3-4 weeks ago has not been able to get a refill.  Increased tiredness/fatigue Stable LUTS on tamsulosin  Sildenafil  efficacy has decreased then he wanted to discuss intracavernosal injections Denies dysuria, gross hematuria No flank, abdominal or pelvic pain Labs 09/02/2024: Testosterone  226 ng/dL, PSA 0.3, H/H 1.2/67.9   PMH: Past Medical History:  Diagnosis Date   ADHD (attention deficit hyperactivity disorder)    Anxiety    Coronary artery disease    Coronary artery disease    Coronary atherosclerosis    Depression    GERD (gastroesophageal reflux disease)    Headache    migrane   Heart disease    Hyperlipidemia    Hypertension    Hypertriglyceridemia    Hypogonadism in male    MVP (mitral valve prolapse)    Narcolepsy and cataplexy    Non-alcoholic fatty liver disease     Surgical History: Past Surgical History:  Procedure Laterality Date   COLONOSCOPY N/A 01/22/2024   Procedure: COLONOSCOPY;  Surgeon: Onita Elspeth Sharper, DO;  Location: [facility] ENDOSCOPY;  Service: Gastroenterology;  Laterality: N/A;  DM   COLONOSCOPY WITH PROPOFOL  N/A 11/18/2018   Procedure: COLONOSCOPY WITH PROPOFOL ;  Surgeon: Viktoria Lamar DASEN, MD;  Location: [facility] ENDOSCOPY;  Service: Endoscopy;  Laterality: N/A;   ESOPHAGOGASTRODUODENOSCOPY (EGD) WITH PROPOFOL  N/A 11/18/2018   Procedure: ESOPHAGOGASTRODUODENOSCOPY (EGD) WITH PROPOFOL ;  Surgeon: Viktoria Lamar DASEN, MD;  Location[facility] ENDOSCOPY;   Service: Endoscopy;  Laterality: N/A;   pain pump implant and removal Bilateral    Dec. 21 2019   POLYPECTOMY  01/22/2024   Procedure: POLYPECTOMY;  Surgeon: Onita Elspeth Sharper, DO;  Location: [facility] ENDOSCOPY;  Service: Gastroenterology;;   SPINE SURGERY     Multiple surgeries, initial 2001, thoracic disc repair and recurrent revisions.   THROAT SURGERY      Home Medications:  Allergies as of 09/03/2024       Reactions   Benzoin Other (See Comments)   blisters   Doxycycline Hyclate Nausea Only   Morphine Other (See Comments)   Penicillins Other (See Comments)        Medication List        Accurate as of September 03, 2024  9:37 AM. If you have any questions, ask your nurse or doctor.          acetaminophen  500 MG tablet Commonly known as: TYLENOL  Take 500 mg by mouth 2 (two) times daily as needed.   ALPRAZolam  0.5 MG tablet Commonly known as: XANAX  Take 0.5 mg by mouth 2 (two) times daily as needed.   amphetamine-dextroamphetamine 10 MG tablet Commonly known as: ADDERALL   aspirin  EC 81 MG tablet Take 81 mg by mouth daily.   atorvastatin  10 MG tablet Commonly known as: LIPITOR Take 1 tablet (10 mg total) by mouth daily.   b complex vitamins capsule Take by mouth.   buPROPion  150 MG 24 hr tablet Commonly known as: WELLBUTRIN  XL Take 150 mg by mouth daily.  Cholecalciferol 25 MCG (1000 UT) Tbdp Take 2 tablets by mouth daily.   CINNAMON PO Take by mouth.   desoximetasone  0.25 % cream Commonly known as: TOPICORT  Apply 1 Application topically 2 (two) times daily as needed.   diclofenac sodium 1 % Gel Commonly known as: VOLTAREN Apply topically.   dicyclomine  20 MG tablet Commonly known as: BENTYL  TAKE 1 TABLET (20 MG TOTAL) BY MOUTH 3 TIMES DAILY BEFORE MEALS. AS NEEDED FOR ABDOMINAL CRAMPING   DULoxetine  60 MG capsule Commonly known as: CYMBALTA  Take 60 mg by mouth 2 (two) times daily.   Fish Oil  1000 MG Caps Take by mouth.   gabapentin   300 MG capsule Commonly known as: NEURONTIN  Take 3 capsules by mouth 3 (three) times daily.   Glucosamine-Chondroitin 250-200 MG Tabs Take by mouth.   hydrOXYzine  25 MG tablet Commonly known as: ATARAX  Take 25 mg by mouth 2 (two) times daily as needed.   ibuprofen 200 MG tablet Commonly known as: ADVIL Take 600 mg by mouth 2 (two) times daily as needed.   lidocaine  5 % Commonly known as: LIDODERM  SMARTSIG:Topical   metFORMIN  1000 MG tablet Commonly known as: GLUCOPHAGE  TAKE 1 TABLET (1,000 MG TOTAL) BY MOUTH TWICE A DAY WITH FOOD   multivitamin tablet Take 1 tablet by mouth daily.   Ozempic  (1 MG/DOSE) 4 MG/3ML Sopn Generic drug: Semaglutide  (1 MG/DOSE) INJECT 1MG  INTO THE SKIN ONCE A WEEK   pantoprazole  40 MG tablet Commonly known as: PROTONIX  Take 1 tablet (40 mg total) by mouth 2 (two) times daily before a meal.   propranolol  40 MG tablet Commonly known as: INDERAL  TAKE 1 TABLET BY MOUTH 3 TIMES DAILY.   sildenafil  100 MG tablet Commonly known as: Viagra  Take 1 tablet (100 mg total) by mouth daily as needed for erectile dysfunction.   tamsulosin  0.4 MG Caps capsule Commonly known as: FLOMAX  TAKE 2 CAPSULES BY MOUTH EVERY DAY   testosterone  cypionate 200 MG/ML injection Commonly known as: DEPOTESTOSTERONE CYPIONATE INJECT 1 ML (200 MG TOTAL) INTO THE MUSCLE EVERY 14 DAYS   tiZANidine 4 MG tablet Commonly known as: ZANAFLEX 1 tablet twice daily, May take an additional 1/2 tablet at bedtime as needed   tiZANidine 2 MG tablet Commonly known as: ZANAFLEX Take 2 mg by mouth at bedtime as needed.   traMADol 50 MG tablet Commonly known as: ULTRAM Take 50 mg by mouth every 6 (six) hours as needed.   traZODone  50 MG tablet Commonly known as: DESYREL  Take 0.5 tablets (25 mg total) by mouth at bedtime as needed.   True Metrix Air Glucose Meter w/Device Kit Check blood sugar 2 times daily   Blood Glucose Monitoring Suppl Devi 1 each by Does not apply route  in the morning, at noon, and at bedtime. May substitute to any manufacturer covered by patient's insurance.   True Metrix Blood Glucose Test test strip Generic drug: glucose blood Check blood sugar 2 times a day   Accu-Chek Guide Test test strip Generic drug: glucose blood 1 EACH BY IN VITRO ROUTE IN THE MORNING, AT NOON, AND AT BEDTIME.        Allergies:  Allergies  Allergen Reactions   Benzoin Other (See Comments)    blisters   Doxycycline Hyclate Nausea Only   Morphine Other (See Comments)   Penicillins Other (See Comments)    Family History: Family History  Problem Relation Age of Onset   Depression Mother    Anxiety disorder Mother    Diabetes Mother  Heart failure Mother    Heart attack Father    Diabetes Sister    Obesity Sister    Drug abuse Brother    Alcohol abuse Brother    Depression Sister    Anxiety disorder Sister    Drug abuse Sister    Anxiety disorder Sister    Seizures Sister    Diabetes Sister    Depression Sister     Social History:  reports that he quit smoking about 13 years ago. His smoking use included cigarettes. He started smoking about 45 years ago. He quit smokeless tobacco use about 13 years ago. He reports that he does not drink alcohol and does not use drugs.   Physical Exam: BP 99/63 (BP Location: Left Arm, Patient Position: Sitting, Cuff Size: Normal)   Pulse (!) 104   Ht 5' 11 (1.803 m)   Wt 199 lb (90.3 kg)   SpO2 98%   BMI 27.75 kg/m   Constitutional:  Alert, No acute distress. HEENT: [place] AT Respiratory: Normal respiratory effort, no increased work of breathing. Psychiatric: Normal mood and affect.   Assessment & Plan:    1.  Hypogonadism Low level although off x 3-4 weeks.  Refill sent to pharmacy Lab visit 6 months testosterone , H/H Office visit 1 year testosterone , H/H, PSA  2.  BPH with LUTS Stable on tamsulosin   3.  Erectile dysfunction We discussed intracavernosal injections including potential side  effects of priapism and corporal scarring Provided Edex website for more information Will call back if interested in pursuing  4.  Anemia Also noted on lab work with Dr. Edman 10/29.  Hematology referral was recommended    Glendia JAYSON Barba, MD  [facility] [address], [address] [place], [place] [address] [phone]

## 2024-09-06 ENCOUNTER — Encounter: Admitting: Physician Assistant

## 2024-09-08 ENCOUNTER — Ambulatory Visit

## 2024-09-08 ENCOUNTER — Telehealth: Payer: Self-pay

## 2024-09-08 ENCOUNTER — Ambulatory Visit
Admission: RE | Admit: 2024-09-08 | Discharge: 2024-09-08 | Disposition: A | Discharge status: Home / Self Care | Source: Ambulatory Visit | Attending: Family Medicine | Admitting: Family Medicine

## 2024-09-08 DIAGNOSIS — I6523 Occlusion and stenosis of bilateral carotid arteries: Secondary | ICD-10-CM | POA: Diagnosis present

## 2024-09-08 NOTE — Telephone Encounter (Signed)
 This form was completed earlier this week, either Monday or Tuesday.  Thanks  Marsa Officer, DO [facility] 09/08/2024, 12:59 PM

## 2024-09-08 NOTE — Telephone Encounter (Signed)
 Left message for patient to return call OK to advise form has been completed and faxed

## 2024-09-08 NOTE — Telephone Encounter (Signed)
 Copied from CRM [id number]. Topic: General - Other >> Sep 08, 2024 11:29 AM [name] wrote: Reason for CRM: Pt called to check the status of the paperwork he dropped off on Monday. Please advise when this has been submitted to the [facility]   Best contact: [phone]

## 2024-09-09 ENCOUNTER — Encounter: Attending: Internal Medicine | Admitting: Internal Medicine

## 2024-09-28 ENCOUNTER — Other Ambulatory Visit: Payer: Self-pay | Admitting: Urology

## 2024-09-28 DIAGNOSIS — N138 Other obstructive and reflux uropathy: Secondary | ICD-10-CM

## 2024-09-29 ENCOUNTER — Encounter: Admitting: Physician Assistant

## 2024-10-11 ENCOUNTER — Encounter: Attending: Internal Medicine | Admitting: Internal Medicine

## 2024-10-11 DIAGNOSIS — M86671 Other chronic osteomyelitis, right ankle and foot: Secondary | ICD-10-CM | POA: Diagnosis not present

## 2024-10-11 DIAGNOSIS — E1169 Type 2 diabetes mellitus with other specified complication: Secondary | ICD-10-CM | POA: Insufficient documentation

## 2024-10-11 DIAGNOSIS — E11621 Type 2 diabetes mellitus with foot ulcer: Secondary | ICD-10-CM | POA: Insufficient documentation

## 2024-10-11 DIAGNOSIS — I10 Essential (primary) hypertension: Secondary | ICD-10-CM | POA: Diagnosis not present

## 2024-10-11 DIAGNOSIS — L97514 Non-pressure chronic ulcer of other part of right foot with necrosis of bone: Secondary | ICD-10-CM | POA: Diagnosis not present

## 2024-10-11 DIAGNOSIS — E1143 Type 2 diabetes mellitus with diabetic autonomic (poly)neuropathy: Secondary | ICD-10-CM | POA: Insufficient documentation

## 2024-10-15 ENCOUNTER — Other Ambulatory Visit: Payer: Self-pay | Admitting: Family Medicine

## 2024-10-15 DIAGNOSIS — K219 Gastro-esophageal reflux disease without esophagitis: Secondary | ICD-10-CM

## 2024-10-18 NOTE — Telephone Encounter (Signed)
 Too soon for refill,LRF 09/01/24 for 90/3RF.  Requested Prescriptions  Pending Prescriptions Disp Refills   pantoprazole  (PROTONIX ) 40 MG tablet [Pharmacy Med Name: PANTOPRAZOLE  SOD DR 40 MG TAB] 90 tablet 1    Sig: TAKE 1 TABLET (40 MG TOTAL) BY MOUTH TWICE A DAY BEFORE MEALS     Gastroenterology: Proton Pump Inhibitors Passed - 10/18/2024  2:22 PM      Passed - Valid encounter within last 12 months    Recent Outpatient Visits           1 month ago Annual physical exam   [place] [facility] [place], Marsa PARAS, DO       Future Appointments             In 10 months Stoioff, Glendia BROCKS, MD [facility]

## 2024-10-25 ENCOUNTER — Encounter: Admitting: Physician Assistant

## 2024-10-25 DIAGNOSIS — E11621 Type 2 diabetes mellitus with foot ulcer: Secondary | ICD-10-CM | POA: Diagnosis not present

## 2024-10-25 LAB — MICROALBUMIN / CREATININE URINE RATIO

## 2024-11-06 ENCOUNTER — Encounter: Payer: Self-pay | Admitting: Urology

## 2024-11-06 ENCOUNTER — Encounter: Payer: Self-pay | Admitting: Family Medicine

## 2024-11-08 ENCOUNTER — Encounter: Attending: Physician Assistant | Admitting: Physician Assistant

## 2024-11-08 DIAGNOSIS — E1143 Type 2 diabetes mellitus with diabetic autonomic (poly)neuropathy: Secondary | ICD-10-CM | POA: Diagnosis not present

## 2024-11-08 DIAGNOSIS — L97514 Non-pressure chronic ulcer of other part of right foot with necrosis of bone: Secondary | ICD-10-CM | POA: Insufficient documentation

## 2024-11-08 DIAGNOSIS — I10 Essential (primary) hypertension: Secondary | ICD-10-CM | POA: Insufficient documentation

## 2024-11-08 DIAGNOSIS — M86671 Other chronic osteomyelitis, right ankle and foot: Secondary | ICD-10-CM | POA: Insufficient documentation

## 2024-11-08 DIAGNOSIS — E11621 Type 2 diabetes mellitus with foot ulcer: Secondary | ICD-10-CM | POA: Insufficient documentation

## 2024-11-22 ENCOUNTER — Encounter: Admitting: Physician Assistant

## 2024-11-22 NOTE — Progress Notes (Signed)
 Dale [name] Dale [name].                                          MRN: [id number]   11/22/2024   The [facility] Quality Team Specialist reviewed this patient medical record for the purposes of chart review for care gap closure. The following were reviewed: abstraction for care gap closure-glycemic status assessment.    VBCI Quality Team

## 2024-11-23 ENCOUNTER — Encounter: Admitting: Physician Assistant

## 2024-11-23 DIAGNOSIS — E11621 Type 2 diabetes mellitus with foot ulcer: Secondary | ICD-10-CM | POA: Diagnosis not present

## 2024-11-30 ENCOUNTER — Encounter: Admitting: Physician Assistant

## 2024-12-01 ENCOUNTER — Other Ambulatory Visit: Payer: Self-pay | Admitting: Family Medicine

## 2024-12-01 ENCOUNTER — Encounter: Admitting: Physician Assistant

## 2024-12-01 DIAGNOSIS — I10 Essential (primary) hypertension: Secondary | ICD-10-CM

## 2024-12-01 DIAGNOSIS — E11621 Type 2 diabetes mellitus with foot ulcer: Secondary | ICD-10-CM | POA: Diagnosis not present

## 2024-12-01 NOTE — Telephone Encounter (Unsigned)
 Copied from CRM [id number]. Topic: Clinical - Medication Refill >> Dec 01, 2024 12:51 PM Winona R wrote: Medication:  propranolol  (INDERAL ) 40 MG tablet- Pt states blood pressure is increasing without the med, he has [facility].    Has the patient contacted their pharmacy? Yes, call provider  (Agent: If no, request that the patient contact the pharmacy for the refill. If patient does not wish to contact the pharmacy document the reason why and proceed with request.) (Agent: If yes, when and what did the pharmacy advise?)  This is the patient's preferred pharmacy:  [facility] [phone] [place], [place] - [address] [place] [address] Phone: [phone] Fax: [phone]  Is this the correct pharmacy for this prescription? Yes If no, delete pharmacy and type the correct one.   Has the prescription been filled recently? Yes  Is the patient out of the medication? Yes- Has the patient been seen for an appointment in the last year OR does the patient have an upcoming appointment? Yes  Can we respond through MyChart? Yes  Agent: Please be advised that Rx refills may take up to 3 business days. We ask that you follow-up with your pharmacy.

## 2024-12-02 NOTE — Telephone Encounter (Signed)
 Requested medication (s) are due for refill today - yes  Requested medication (s) are on the active medication list -yes  Future visit scheduled -yes  Last refill: 09/01/24 #270  Notes to clinic: fails lab protocol- over 1 year- 09/03/24, Duplicate request  Requested Prescriptions  Pending Prescriptions Disp Refills   propranolol  (INDERAL ) 40 MG tablet [Pharmacy Med Name: PROPRANOLOL  40 MG TABLET] 270 tablet 0    Sig: TAKE 1 TABLET BY MOUTH 3 TIMES DAILY.     Cardiovascular:  Beta Blockers Passed - 12/02/2024  1:55 PM      Passed - Last BP in normal range    BP Readings from Last 1 Encounters:  09/03/24 99/63         Passed - Last Heart Rate in normal range    Pulse Readings from Last 1 Encounters:  09/03/24 (!) 104         Passed - Valid encounter within last 6 months    Recent Outpatient Visits           3 months ago Annual physical exam   [place] [facility] [place], Marsa PARAS, DO       Future Appointments             In 9 months Stoioff, Glendia BROCKS, MD [facility] [place]               Requested Prescriptions  Pending Prescriptions Disp Refills   propranolol  (INDERAL ) 40 MG tablet [Pharmacy Med Name: PROPRANOLOL  40 MG TABLET] 270 tablet 0    Sig: TAKE 1 TABLET BY MOUTH 3 TIMES DAILY.     Cardiovascular:  Beta Blockers Passed - 12/02/2024  1:55 PM      Passed - Last BP in normal range    BP Readings from Last 1 Encounters:  09/03/24 99/63         Passed - Last Heart Rate in normal range    Pulse Readings from Last 1 Encounters:  09/03/24 (!) 104         Passed - Valid encounter within last 6 months    Recent Outpatient Visits           3 months ago Annual physical exam   [place] [facility] [place], Marsa PARAS, DO       Future Appointments             In 9 months Stoioff, Glendia BROCKS, MD [facility]

## 2024-12-02 NOTE — Telephone Encounter (Signed)
 Requested medication (s) are due for refill today - yes  Requested medication (s) are on the active medication list -yes  Future visit scheduled -yes  Last refill: 09/01/24 #270  Notes to clinic: fails lab protocol- over 1 year-09/03/24  Requested Prescriptions  Pending Prescriptions Disp Refills   propranolol  (INDERAL ) 40 MG tablet [Pharmacy Med Name: PROPRANOLOL  40 MG TABLET] 270 tablet 0    Sig: TAKE 1 TABLET BY MOUTH 3 TIMES DAILY.     Cardiovascular:  Beta Blockers Passed - 12/02/2024  1:52 PM      Passed - Last BP in normal range    BP Readings from Last 1 Encounters:  09/03/24 99/63         Passed - Last Heart Rate in normal range    Pulse Readings from Last 1 Encounters:  09/03/24 (!) 104         Passed - Valid encounter within last 6 months    Recent Outpatient Visits           3 months ago Annual physical exam   [place] [facility] [place], Marsa PARAS, DO       Future Appointments             In 9 months Stoioff, Glendia BROCKS, MD [facility] [place]               Requested Prescriptions  Pending Prescriptions Disp Refills   propranolol  (INDERAL ) 40 MG tablet [Pharmacy Med Name: PROPRANOLOL  40 MG TABLET] 270 tablet 0    Sig: TAKE 1 TABLET BY MOUTH 3 TIMES DAILY.     Cardiovascular:  Beta Blockers Passed - 12/02/2024  1:52 PM      Passed - Last BP in normal range    BP Readings from Last 1 Encounters:  09/03/24 99/63         Passed - Last Heart Rate in normal range    Pulse Readings from Last 1 Encounters:  09/03/24 (!) 104         Passed - Valid encounter within last 6 months    Recent Outpatient Visits           3 months ago Annual physical exam   [place] [facility] [place], Marsa PARAS, DO       Future Appointments             In 9 months Stoioff, Glendia BROCKS, MD [facility] [place]

## 2024-12-03 ENCOUNTER — Ambulatory Visit: Admitting: Family Medicine

## 2024-12-06 ENCOUNTER — Ambulatory Visit: Admitting: Family Medicine

## 2024-12-07 ENCOUNTER — Encounter: Admitting: Physician Assistant

## 2024-12-15 ENCOUNTER — Ambulatory Visit: Admitting: Family Medicine

## 2024-12-15 ENCOUNTER — Encounter: Admitting: Physician Assistant

## 2025-03-01 ENCOUNTER — Other Ambulatory Visit

## 2025-07-29 ENCOUNTER — Ambulatory Visit

## 2025-08-31 ENCOUNTER — Other Ambulatory Visit

## 2025-09-02 ENCOUNTER — Ambulatory Visit: Admitting: Urology
